# Patient Record
Sex: Male | Born: 1955 | Race: White | Hispanic: No | Marital: Married | Smoking: Former smoker
Health system: Southern US, Community
[De-identification: ages and names within clinical notes are randomized; demographics above are authoritative.]

## PROBLEM LIST (undated history)

## (undated) DIAGNOSIS — K7460 Unspecified cirrhosis of liver: Secondary | ICD-10-CM

## (undated) DIAGNOSIS — I428 Other cardiomyopathies: Secondary | ICD-10-CM

## (undated) DIAGNOSIS — E669 Obesity, unspecified: Secondary | ICD-10-CM

## (undated) DIAGNOSIS — Z7289 Other problems related to lifestyle: Secondary | ICD-10-CM

## (undated) DIAGNOSIS — E119 Type 2 diabetes mellitus without complications: Secondary | ICD-10-CM

## (undated) DIAGNOSIS — K760 Fatty (change of) liver, not elsewhere classified: Secondary | ICD-10-CM

## (undated) DIAGNOSIS — N400 Enlarged prostate without lower urinary tract symptoms: Secondary | ICD-10-CM

## (undated) DIAGNOSIS — I10 Essential (primary) hypertension: Secondary | ICD-10-CM

## (undated) DIAGNOSIS — E039 Hypothyroidism, unspecified: Secondary | ICD-10-CM

## (undated) DIAGNOSIS — I513 Intracardiac thrombosis, not elsewhere classified: Secondary | ICD-10-CM

## (undated) DIAGNOSIS — Z95810 Presence of automatic (implantable) cardiac defibrillator: Secondary | ICD-10-CM

## (undated) DIAGNOSIS — I472 Ventricular tachycardia, unspecified: Secondary | ICD-10-CM

## (undated) DIAGNOSIS — I442 Atrioventricular block, complete: Secondary | ICD-10-CM

## (undated) DIAGNOSIS — I2510 Atherosclerotic heart disease of native coronary artery without angina pectoris: Secondary | ICD-10-CM

## (undated) DIAGNOSIS — N184 Chronic kidney disease, stage 4 (severe): Secondary | ICD-10-CM

## (undated) DIAGNOSIS — E785 Hyperlipidemia, unspecified: Secondary | ICD-10-CM

## (undated) DIAGNOSIS — F1090 Alcohol use, unspecified, uncomplicated: Secondary | ICD-10-CM

## (undated) DIAGNOSIS — K635 Polyp of colon: Secondary | ICD-10-CM

## (undated) DIAGNOSIS — I5022 Chronic systolic (congestive) heart failure: Secondary | ICD-10-CM

## (undated) HISTORY — DX: Intracardiac thrombosis, not elsewhere classified: I51.3

## (undated) HISTORY — DX: Obesity, unspecified: E66.9

## (undated) HISTORY — DX: Chronic kidney disease, stage 4 (severe): N18.4

## (undated) HISTORY — PX: BACK SURGERY: SHX140

## (undated) HISTORY — DX: Polyp of colon: K63.5

## (undated) HISTORY — DX: Type 2 diabetes mellitus without complications: E11.9

## (undated) HISTORY — DX: Other cardiomyopathies: I42.8

## (undated) HISTORY — PX: CARDIAC CATHETERIZATION: SHX172

## (undated) HISTORY — DX: Hyperlipidemia, unspecified: E78.5

## (undated) HISTORY — PX: PACEMAKER INSERTION: SHX728

## (undated) HISTORY — DX: Ventricular tachycardia: I47.2

## (undated) HISTORY — DX: Essential (primary) hypertension: I10

## (undated) HISTORY — DX: Ventricular tachycardia, unspecified: I47.20

## (undated) HISTORY — PX: FACIAL RECONSTRUCTION SURGERY: SHX631

---

## 2002-11-20 ENCOUNTER — Encounter: Admission: RE | Admit: 2002-11-20 | Discharge: 2002-11-22 | Payer: Self-pay | Admitting: *Deleted

## 2004-11-23 ENCOUNTER — Ambulatory Visit: Payer: Self-pay | Admitting: *Deleted

## 2004-12-02 ENCOUNTER — Ambulatory Visit: Payer: Self-pay

## 2004-12-11 ENCOUNTER — Ambulatory Visit: Payer: Self-pay | Admitting: Internal Medicine

## 2004-12-11 ENCOUNTER — Ambulatory Visit: Payer: Self-pay | Admitting: Cardiology

## 2004-12-11 ENCOUNTER — Inpatient Hospital Stay (HOSPITAL_BASED_OUTPATIENT_CLINIC_OR_DEPARTMENT_OTHER): Admission: RE | Admit: 2004-12-11 | Discharge: 2004-12-11 | Payer: Self-pay | Admitting: Internal Medicine

## 2004-12-24 ENCOUNTER — Ambulatory Visit: Payer: Self-pay | Admitting: Cardiology

## 2005-01-21 ENCOUNTER — Ambulatory Visit: Payer: Self-pay | Admitting: Cardiology

## 2005-04-28 ENCOUNTER — Encounter (INDEPENDENT_AMBULATORY_CARE_PROVIDER_SITE_OTHER): Payer: Self-pay | Admitting: *Deleted

## 2005-04-28 ENCOUNTER — Observation Stay (HOSPITAL_COMMUNITY): Admission: EM | Admit: 2005-04-28 | Discharge: 2005-04-28 | Payer: Self-pay | Admitting: Emergency Medicine

## 2005-06-23 ENCOUNTER — Ambulatory Visit: Payer: Self-pay | Admitting: Cardiology

## 2006-03-02 ENCOUNTER — Ambulatory Visit: Payer: Self-pay | Admitting: Cardiology

## 2006-03-17 ENCOUNTER — Ambulatory Visit: Payer: Self-pay

## 2006-03-17 ENCOUNTER — Encounter: Payer: Self-pay | Admitting: Cardiology

## 2006-06-23 ENCOUNTER — Emergency Department (HOSPITAL_COMMUNITY): Admission: EM | Admit: 2006-06-23 | Discharge: 2006-06-23 | Payer: Self-pay | Admitting: Emergency Medicine

## 2006-08-19 ENCOUNTER — Inpatient Hospital Stay (HOSPITAL_COMMUNITY): Admission: EM | Admit: 2006-08-19 | Discharge: 2006-08-20 | Payer: Self-pay | Admitting: Emergency Medicine

## 2006-08-19 ENCOUNTER — Ambulatory Visit: Payer: Self-pay | Admitting: Internal Medicine

## 2006-08-19 ENCOUNTER — Encounter: Payer: Self-pay | Admitting: Internal Medicine

## 2006-10-31 ENCOUNTER — Ambulatory Visit: Payer: Self-pay | Admitting: Internal Medicine

## 2007-07-05 ENCOUNTER — Ambulatory Visit: Payer: Self-pay | Admitting: Internal Medicine

## 2007-08-05 LAB — HM COLONOSCOPY

## 2007-10-12 ENCOUNTER — Ambulatory Visit: Payer: Self-pay | Admitting: Internal Medicine

## 2008-01-23 ENCOUNTER — Ambulatory Visit: Payer: Self-pay | Admitting: Internal Medicine

## 2008-04-24 ENCOUNTER — Ambulatory Visit: Payer: Self-pay | Admitting: Internal Medicine

## 2008-10-23 ENCOUNTER — Ambulatory Visit: Payer: Self-pay | Admitting: Internal Medicine

## 2009-01-10 ENCOUNTER — Encounter: Payer: Self-pay | Admitting: Internal Medicine

## 2009-01-16 DIAGNOSIS — I498 Other specified cardiac arrhythmias: Secondary | ICD-10-CM | POA: Insufficient documentation

## 2009-01-16 DIAGNOSIS — I10 Essential (primary) hypertension: Secondary | ICD-10-CM

## 2009-01-16 DIAGNOSIS — I421 Obstructive hypertrophic cardiomyopathy: Secondary | ICD-10-CM

## 2009-01-16 DIAGNOSIS — Z794 Long term (current) use of insulin: Secondary | ICD-10-CM

## 2009-01-16 DIAGNOSIS — E785 Hyperlipidemia, unspecified: Secondary | ICD-10-CM

## 2009-01-16 DIAGNOSIS — I219 Acute myocardial infarction, unspecified: Secondary | ICD-10-CM | POA: Insufficient documentation

## 2009-01-16 DIAGNOSIS — E119 Type 2 diabetes mellitus without complications: Secondary | ICD-10-CM

## 2009-01-16 DIAGNOSIS — I429 Cardiomyopathy, unspecified: Secondary | ICD-10-CM | POA: Insufficient documentation

## 2009-01-16 DIAGNOSIS — I442 Atrioventricular block, complete: Secondary | ICD-10-CM | POA: Insufficient documentation

## 2009-01-17 ENCOUNTER — Ambulatory Visit: Payer: Self-pay | Admitting: Internal Medicine

## 2009-01-17 ENCOUNTER — Encounter: Payer: Self-pay | Admitting: Internal Medicine

## 2009-01-27 ENCOUNTER — Ambulatory Visit: Payer: Self-pay | Admitting: Cardiology

## 2009-01-27 ENCOUNTER — Inpatient Hospital Stay (HOSPITAL_COMMUNITY): Admission: EM | Admit: 2009-01-27 | Discharge: 2009-01-30 | Payer: Self-pay | Admitting: Emergency Medicine

## 2009-01-27 ENCOUNTER — Encounter: Payer: Self-pay | Admitting: Cardiology

## 2009-01-28 ENCOUNTER — Encounter: Payer: Self-pay | Admitting: Cardiology

## 2009-02-05 ENCOUNTER — Ambulatory Visit: Payer: Self-pay | Admitting: Cardiology

## 2009-02-17 ENCOUNTER — Ambulatory Visit: Payer: Self-pay | Admitting: Internal Medicine

## 2009-02-18 ENCOUNTER — Encounter: Payer: Self-pay | Admitting: Internal Medicine

## 2009-02-18 LAB — CONVERTED CEMR LAB
Basophils Relative: 0.8 % (ref 0.0–3.0)
Eosinophils Absolute: 0.4 10*3/uL (ref 0.0–0.7)
Eosinophils Relative: 7.7 % — ABNORMAL HIGH (ref 0.0–5.0)
HCT: 42.7 % (ref 39.0–52.0)
Lymphs Abs: 1.4 10*3/uL (ref 0.7–4.0)
MCHC: 34.7 g/dL (ref 30.0–36.0)
MCV: 88.8 fL (ref 78.0–100.0)
Monocytes Absolute: 0.2 10*3/uL (ref 0.1–1.0)
Neutrophils Relative %: 63.8 % (ref 43.0–77.0)
Platelets: 193 10*3/uL (ref 150.0–400.0)
Prothrombin Time: 9.2 s — ABNORMAL LOW (ref 10.9–13.3)
WBC: 5.8 10*3/uL (ref 4.5–10.5)

## 2009-02-19 ENCOUNTER — Inpatient Hospital Stay (HOSPITAL_COMMUNITY): Admission: EM | Admit: 2009-02-19 | Discharge: 2009-02-22 | Payer: Self-pay | Admitting: Emergency Medicine

## 2009-02-19 ENCOUNTER — Ambulatory Visit: Payer: Self-pay | Admitting: Cardiology

## 2009-02-19 ENCOUNTER — Telehealth: Payer: Self-pay | Admitting: Internal Medicine

## 2009-02-22 ENCOUNTER — Encounter: Payer: Self-pay | Admitting: Internal Medicine

## 2009-02-24 ENCOUNTER — Encounter: Payer: Self-pay | Admitting: Internal Medicine

## 2009-02-25 ENCOUNTER — Telehealth: Payer: Self-pay | Admitting: Internal Medicine

## 2009-02-26 ENCOUNTER — Telehealth: Payer: Self-pay | Admitting: Internal Medicine

## 2009-02-26 ENCOUNTER — Encounter: Payer: Self-pay | Admitting: Internal Medicine

## 2009-03-10 ENCOUNTER — Ambulatory Visit: Payer: Self-pay

## 2009-03-10 ENCOUNTER — Encounter: Payer: Self-pay | Admitting: Internal Medicine

## 2009-04-21 ENCOUNTER — Encounter: Payer: Self-pay | Admitting: Internal Medicine

## 2009-04-22 ENCOUNTER — Encounter: Payer: Self-pay | Admitting: Internal Medicine

## 2009-05-21 ENCOUNTER — Ambulatory Visit: Payer: Self-pay | Admitting: Cardiology

## 2009-06-10 ENCOUNTER — Ambulatory Visit: Payer: Self-pay | Admitting: Internal Medicine

## 2009-06-10 DIAGNOSIS — Z95810 Presence of automatic (implantable) cardiac defibrillator: Secondary | ICD-10-CM

## 2009-08-21 ENCOUNTER — Encounter (INDEPENDENT_AMBULATORY_CARE_PROVIDER_SITE_OTHER): Payer: Self-pay | Admitting: *Deleted

## 2009-09-18 ENCOUNTER — Encounter: Payer: Self-pay | Admitting: Internal Medicine

## 2009-09-28 ENCOUNTER — Encounter: Payer: Self-pay | Admitting: Internal Medicine

## 2009-09-29 ENCOUNTER — Ambulatory Visit: Payer: Self-pay | Admitting: Internal Medicine

## 2009-09-29 ENCOUNTER — Telehealth: Payer: Self-pay | Admitting: Internal Medicine

## 2010-01-07 ENCOUNTER — Ambulatory Visit: Payer: Self-pay | Admitting: Cardiology

## 2010-01-07 DIAGNOSIS — N259 Disorder resulting from impaired renal tubular function, unspecified: Secondary | ICD-10-CM

## 2010-01-07 DIAGNOSIS — E669 Obesity, unspecified: Secondary | ICD-10-CM

## 2010-01-15 ENCOUNTER — Ambulatory Visit: Payer: Self-pay | Admitting: Internal Medicine

## 2010-02-18 ENCOUNTER — Encounter: Payer: Self-pay | Admitting: Cardiology

## 2010-03-04 ENCOUNTER — Ambulatory Visit: Payer: Self-pay | Admitting: Cardiology

## 2010-04-14 ENCOUNTER — Ambulatory Visit: Payer: Self-pay | Admitting: Internal Medicine

## 2010-04-15 ENCOUNTER — Ambulatory Visit: Payer: Self-pay | Admitting: Cardiology

## 2010-04-23 ENCOUNTER — Encounter: Payer: Self-pay | Admitting: Internal Medicine

## 2010-05-20 ENCOUNTER — Ambulatory Visit: Payer: Self-pay | Admitting: Cardiology

## 2010-06-02 ENCOUNTER — Encounter: Payer: Self-pay | Admitting: Cardiology

## 2010-07-04 LAB — HM PAP SMEAR

## 2010-07-20 ENCOUNTER — Encounter: Payer: Self-pay | Admitting: Cardiology

## 2010-07-22 ENCOUNTER — Encounter: Payer: Self-pay | Admitting: Cardiology

## 2010-07-23 ENCOUNTER — Ambulatory Visit: Payer: Self-pay | Admitting: Internal Medicine

## 2010-08-07 ENCOUNTER — Encounter (INDEPENDENT_AMBULATORY_CARE_PROVIDER_SITE_OTHER): Payer: Self-pay | Admitting: *Deleted

## 2010-08-12 ENCOUNTER — Ambulatory Visit: Payer: Self-pay | Admitting: Cardiology

## 2010-10-22 ENCOUNTER — Ambulatory Visit
Admission: RE | Admit: 2010-10-22 | Discharge: 2010-10-22 | Payer: Self-pay | Source: Home / Self Care | Attending: Internal Medicine | Admitting: Internal Medicine

## 2010-10-22 ENCOUNTER — Encounter: Payer: Self-pay | Admitting: Internal Medicine

## 2010-11-03 NOTE — Letter (Signed)
Summary: Remote Device Check  [employer], Main Office  [address]   [place], [place] [address]   Phone: [phone]  Fax: [phone]     April 23, 2010 MRN: [id number]   ORY ELTING [address] [place], [place]  [address]   Dear Mr. LEISINGER,   Your remote transmission was recieved and reviewed by your physician.  All diagnostics were within normal limits for you.  __X___Your next transmission is scheduled for:  07-23-2010.  Please transmit at any time this day.  If you have a wireless device your transmission will be sent automatically.   Sincerely,  Vella Kohler

## 2010-11-03 NOTE — Assessment & Plan Note (Signed)
Summary: [name]   Visit Type:  Follow-up Primary Provider:  Dr.  Christell Constant  CC:  Cardiomyopathy.  History of Present Illness: The patient presents for followup of his known cardiomyopathy. At the last visit I increased his carvedilol to 18.75 mg b.i.d. He did well with this. He had no presyncope or syncope. He has had no shortness of breath. He denies any chest discomfort, neck or arm discomfort. He has had no weight gain or edema. He still admits to eating too much salt.  Current Medications (verified): 1)  Actos 30 Mg Tabs (Pioglitazone Hcl) .[name].. 1 By Mouth Once Daily 2)  Januvia 100 Mg Tabs (Sitagliptin Phosphate) .[name].. 1 By Mouth At Bedtime 3)  Trilipix 135 Mg Cpdr (Choline Fenofibrate) .[name].. 1 By Mouth Once Daily 4)  Vytorin 10-40 Mg Tabs (Ezetimibe-Simvastatin) .... Take One Tablet By Mouth Dailyat Bedtime 5)  Glimepiride 4 Mg Tabs (Glimepiride) .[name].. 1 By Mouth Two Times A Day 6)  Coreg 12.5 Mg Tabs (Carvedilol) .[name].. 1 1/2 By Mouth Daily 7)  Metformin Hcl 500 Mg Tabs (Metformin Hcl) .... 2 Tabs Two Times A Day 8)  Aspirin Ec 325 Mg Tbec (Aspirin) .... Take One Tablet By Mouth Daily 9)  Welchol 625 Mg Tabs (Colesevelam Hcl) .... Take 3 Tablets By Mouth Once Daily 10)  Fish Oil   Oil (Fish Oil) .... 3 Capsule Once Daily 11)  Vitamin D3 1000 Unit Caps (Cholecalciferol) .[name].. 1 By Mouth Once Daily 12)  Lantus 100 Unit/ml Soln (Insulin Glargine) .... As Directed 13)  Furosemide 40 Mg Tabs (Furosemide) .[name].. 1 By Mouth Daily  Allergies (verified): 1)  ! Nitroglycerin 2)  ! * No Ivp Dye Allergy 3)  ! * No Shellfish Allergy 4)  ! * No Latex Allergy  Past History:  Past Medical History: Reviewed history from 01/16/2009 and no changes required. 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 Pacemaker - medtronic  Review of Systems       As stated in the HPI and negative for all other systems.   Vital Signs:  Patient profile:   55 year old male Height:      73 inches Weight:      270 pounds BMI:     35.75 Resp:     16 per minute BP sitting:   124 / 72  (right arm)  Vitals Entered By: Marrion Coy, CNA (April 15, 2010 9:47 AM)  Physical Exam  General:  Well developed, well nourished, in no acute distress. Head:  normocephalic and atraumatic Mouth:  Edentulous.. Oral mucosa normal. Neck:  Neck supple, no JVD. No masses, thyromegaly or abnormal cervical nodes. Chest Wall:  well-healed ICD pocket Lungs:  Clear bilaterally to auscultation with no wheezes, rales, or rhonchi. Heart:  Distant with a RRR.  Normal S1 and S2.  PMI is enlarged and laterally displaced. Abdomen:  Bowel sounds positive; abdomen soft and non-tender without masses, organomegaly, or hernias noted. No hepatosplenomegaly, obese Msk:  Back normal, normal gait. Muscle strength and tone normal. Extremities:  no edema. Neurologic:  Alert and oriented x 3. Skin:  Intact without lesions or rashes. Psych:  Normal affect.   EKG  Procedure date:  04/15/2010  Findings:      AV paced rhythm Imitrex topic complexes  PPM Specifications Following MD:  Inactive     PPM Vendor:  Medtronic     PPM Model Number:  ADDR01     PPM Serial Number:  [id number] PPM DOI:  08/19/2006     PPM Implanting MD:  Lewayne Bunting, MD  Lead 1    Location: RA     DOI: 08/19/2006     Model #: 0347     Serial #: [id number]     Status: active Lead 2    Location: RV     DOI: 08/19/2006     Model #: 6433     Serial #: [id number]     Status: active  Magnet Response Rate:  BOL 85 ERI  65  Indications:  CHB Pacemaker dependent  Explantation Comments:  02/21/2009 Addapta  [id number] H EXPLANTED  PPM Follow Up Pacer Dependent:  Yes      Episodes Coumadin:  No  Parameters Mode:  DDDR     Lower Rate Limit:  60     Upper Rate Limit:  130 Paced AV Delay:  200     Sensed AV Delay:  170  ICD Specifications Following MD:  Lewayne Bunting, MD     Referring MD:  [facility] ICD Vendor:  Medtronic     ICD Model Number:  [id number]     ICD Serial Number:  [id number] H ICD DOI:  02/21/2009     ICD Implanting MD:  Lewayne Bunting, MD  Lead 1:    Location: RA     DOI: 08/19/2006     Model #: 3220     Serial #: [id number]     Status: active Lead 2:    Location: RV     DOI: 02/21/2009     Model #: 6283     Serial #: [id number]     Status: active Lead 3:    Location: LV     DOI: 02/21/2009     Model #: 7371     Serial #: [id number]     Status: active  Indications::  CM   ICD Follow Up ICD Dependent:  Yes       ICD Device Measurements Configuration: LV TIP TO RV COIL  Episodes Coumadin:  No  Brady Parameters Mode DDDR     Lower Rate Limit:  60     Upper Rate Limit 130 PAV 130     Sensed AV Delay:  100  Tachy Zones VF:  240     VT:  200 FVT VIA VT     VT1:  171     Impression & Recommendations:  Problem # 1:  CARDIOMYOPATHY, SECONDARY (ICD-425.9) The patient tolerated the increased dose of carvedilol in today I will increase to 25 mg daily. I am avoiding ACE and ARB for now because of renal insufficiency but I will consider this at future appointments. Orders: EKG w/ Interpretation (93000)  Problem # 2:  MYOCARDIAL INFARCTION (ICD-410.90) He is having no symptoms related to coronary artery disease. He will continue with risk reduction.  Patient Instructions: 1)  Your physician recommends that you schedule a follow-up appointment in: 1 month with Dr Antoine Poche in [place] 2)  Your physician has recommended you make the following change in your medication: Increase Carvedilol to 25 mg twice a day 3)  You have been diagnosed with Congestive Heart Failure or CHF.  CHF is  a condition in which a problem with the structure or function of the heart impairs its ability to supply sufficient blood flow to meet the body's needs.  For further information please visit www.cardiosmart.org for detailed information on CHF. 4)  Your physician recommends that you weigh, daily, at the same time every day, and in the same amount of clothing.  Please record your daily weights on the handout provided and bring it to your next appointment.

## 2010-11-03 NOTE — Cardiovascular Report (Signed)
Summary: Office Visit Remote   Office Visit Remote   Imported By: Roderic Ovens 04/23/2010 16:13:29  _____________________________________________________________________  External Attachment:    Type:   Image     Comment:   External Document

## 2010-11-03 NOTE — Assessment & Plan Note (Signed)
Summary: 1 yr f/u medtronic   Visit Type:  Follow-up Primary Provider:  Dr.  Christell Constant   History of Present Illness: [name] returns today for ICD followup.  He has a h/o Class III heart failure with complete heart block and acute and longstanding cardiomyopathy (hypertrophic obstructive cardiomyopathy).  He denies c/p, sob and peripheral edema.  No intercurrent ICD therapies.  He continues to work.  Current Medications (verified): 1)  Actos 30 Mg Tabs (Pioglitazone Hcl) .[name].. 1 By Mouth Once Daily 2)  Januvia 100 Mg Tabs (Sitagliptin Phosphate) .[name].. 1 By Mouth At Bedtime 3)  Trilipix 135 Mg Cpdr (Choline Fenofibrate) .[name].. 1 By Mouth Once Daily 4)  Vytorin 10-40 Mg Tabs (Ezetimibe-Simvastatin) .... Take One Tablet By Mouth Dailyat Bedtime 5)  Glimepiride 4 Mg Tabs (Glimepiride) .[name].. 1 By Mouth Two Times A Day 6)  Carvedilol 6.25 Mg Tabs (Carvedilol) .... Take 2 Tablets By Mouth Twice A Day 7)  Metformin Hcl 500 Mg Tabs (Metformin Hcl) .... 2 Tabs Two Times A Day 8)  Aspirin Ec 325 Mg Tbec (Aspirin) .... Take One Tablet By Mouth Daily 9)  Welchol 625 Mg Tabs (Colesevelam Hcl) .... Take 3 Tablets By Mouth Once Daily 10)  Fish Oil   Oil (Fish Oil) .... 3 Capsule Once Daily 11)  Vitamin D3 1000 Unit Caps (Cholecalciferol) .[name].. 1 By Mouth Once Daily 12)  Lantus 100 Unit/ml Soln (Insulin Glargine) .... As Directed 13)  Furosemide 40 Mg Tabs (Furosemide) .[name].. 1 By Mouth Daily  Allergies: 1)  ! Nitroglycerin 2)  ! * No Ivp Dye Allergy 3)  ! * No Shellfish Allergy 4)  ! 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    Vital Signs:  Patient profile:   55 year old male Height:      73 inches Weight:      276 pounds BMI:     36.55 Pulse rate:   61 / minute BP sitting:   120 / 80  (left arm)  Vitals Entered By: Laurance Flatten CMA (January 15, 2010 9:22 AM)  Physical Exam  General:  Well developed, well nourished, in no acute distress. Head:  normocephalic and atraumatic Eyes:  PERRLA/EOM intact; conjunctiva and lids normal. Mouth:  Edentulous.. Oral mucosa normal. Neck:  Neck supple, no JVD. No masses, thyromegaly or abnormal cervical nodes. Chest Wall:  well-healed ICD pocket Lungs:  Clear bilaterally to auscultation with no wheezes, rales, or rhonchi. Heart:  Distant with a RRR.  Normal S1 and S2.  PMI is enlarged and laterally displaced. Abdomen:  Bowel sounds positive; abdomen soft and non-tender without masses, organomegaly, or hernias noted. No hepatosplenomegaly, obese Msk:  Back normal, normal gait. Muscle strength and tone normal. Pulses:  normal pedal pulses bilaterally.   Extremities:  no edema. Neurologic:  Alert and oriented x 3.   PPM Specifications Following MD:  Inactive     PPM Vendor:  Medtronic     PPM Model Number:  ADDR01     PPM Serial Number:  [id number] PPM DOI:  08/19/2006     PPM Implanting MD:  Lewayne Bunting, MD  Lead 1  Location: RA     DOI: 08/19/2006     Model #: 7829     Serial #: [id number]     Status: active Lead 2    Location: RV     DOI: 08/19/2006     Model #: 8469     Serial #: [id number]     Status: active  Magnet Response Rate:  BOL 85 ERI  65  Indications:  CHB Pacemaker dependent  Explantation Comments:  02/21/2009 Addapta [id number] H EXPLANTED  PPM Follow Up Pacer Dependent:  Yes      Episodes Coumadin:  No  Parameters Mode:  DDDR     Lower Rate Limit:  60     Upper Rate Limit:  130 Paced AV Delay:   200     Sensed AV Delay:  170  ICD Specifications Following MD:  Lewayne Bunting, MD     Referring MD:  [facility] ICD Vendor:  Medtronic     ICD Model Number:  [id number]     ICD Serial Number:  [id number] ICD DOI:  02/21/2009     ICD Implanting MD:  Lewayne Bunting, MD  Lead 1:    Location: RA     DOI: 08/19/2006     Model #: 4742     Serial #: [id number]     Status: active Lead 2:    Location: RV     DOI: 02/21/2009     Model #: 3329     Serial #: [id number]     Status: active Lead 3:    Location: LV     DOI: 02/21/2009     Model #: 0630     Serial #: [id number]     Status: active  Indications::  CM   ICD Follow Up Remote Check?  No Battery Voltage:  3.16 V     Charge Time:  9.3 seconds     Underlying rhythm:  CHB ICD Dependent:  Yes       ICD Device Measurements Atrium:  Amplitude: 2.0 mV, Impedance: 532 ohms, Threshold: 0.5 V at 0.4 msec Right Ventricle:  Amplitude: PACED AT 30 mV, Impedance: 437 ohms, Threshold: 1.0 V at 0.4 msec Left Ventricle:  Impedance: 494 ohms, Threshold: 0.75 V at 0.4 msec Configuration: LV TIP TO RV COIL Shock Impedance: 49/65 ohms   Episodes MS Episodes:  0     Coumadin:  No Shock:  0     ATP:  0     Nonsustained:  0     Atrial Pacing:  83.7%     Ventricular Pacing:  99.5%  Brady Parameters Mode DDDR     Lower Rate Limit:  60     Upper Rate Limit 130 PAV 130     Sensed AV Delay:  100  Tachy Zones VF:  240     VT:  200 FVT VIA VT     VT1:  171     Next Remote Date:  04/14/2010     Next Cardiology Appt Due:  01/04/2011 Tech Comments:  Normal device function.  No changes made today.  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  ROV 12 months GT. Gypsy Balsam RN BSN  January 15, 2010 9:38 AM  MD Comments:  Agree with above.  Impression & Recommendations:  Problem # 1:  AUTOMATIC IMPLANTABLE CARDIAC DEFIBRILLATOR SITU (ICD-V45.02) His device is working normally.  Will recheck in several months.  Problem # 2:  CARDIOMYOPATHY, SECONDARY (ICD-425.9) He denies CHF symptoms.   He is class 1-2 at  present. A low sodium diet is requested. His updated medication list for this problem includes:    Carvedilol 6.25 Mg Tabs (Carvedilol) .[name]... Take 2 tablets by mouth twice a day    Aspirin Ec 325 Mg Tbec (Aspirin) .[name]... Take one tablet by mouth daily    Furosemide 40 Mg Tabs (Furosemide) .[name]... 1 by mouth daily  Problem # 3:  HYPERTENSION, UNSPECIFIED (ICD-401.9) Continue current meds.  A low sodium diet is recommended. His updated medication list for this problem includes:    Carvedilol 6.25 Mg Tabs (Carvedilol) .[name]... Take 2 tablets by mouth twice a day    Aspirin Ec 325 Mg Tbec (Aspirin) .[name]... Take one tablet by mouth daily    Furosemide 40 Mg Tabs (Furosemide) .[name]... 1 by mouth daily  Patient Instructions: 1)  Your physician wants you to follow-up in: 12 months with Dr [name].  You will receive a reminder letter in the mail two months in advance. If you don't receive a letter, please call our office to schedule the follow-up appointment.

## 2010-11-03 NOTE — Assessment & Plan Note (Signed)
Summary: [facility]   Visit Type:  Follow-up Primary Provider:  Dr.  Christell Constant  CC:  Cardiomyopathy.  History of Present Illness: The ppresents for followup although his cardiomyopathy. At the last appointment I increased his carvedilol to 25 mg b.i.d. He did well with this. He has had no lightheadedness, presyncope or syncope. He has had no chest pressure, neck or arm discomfort. He has had no new shortness of breath, PND or orthopnea. He has had no edema.  Unfortunately he is not dieting and has gained 5 pounds.   Current Medications (verified): 1)  Actos 30 Mg Tabs (Pioglitazone Hcl) .[name].. 1 By Mouth Once Daily 2)  Januvia 100 Mg Tabs (Sitagliptin Phosphate) .[name].. 1 By Mouth At Bedtime 3)  Trilipix 135 Mg Cpdr (Choline Fenofibrate) .[name].. 1 By Mouth Once Daily 4)  Vytorin 10-40 Mg Tabs (Ezetimibe-Simvastatin) .... Take One Tablet By Mouth Dailyat Bedtime 5)  Glimepiride 4 Mg Tabs (Glimepiride) .[name].. 1 By Mouth Two Times A Day 6)  Coreg 12.5 Mg Tabs (Carvedilol) .... 2 By Mouth Two Times A Day 7)  Metformin Hcl 500 Mg Tabs (Metformin Hcl) .... 2 Tabs Two Times A Day 8)  Aspirin Ec 325 Mg Tbec (Aspirin) .... Take One Tablet By Mouth Daily 9)  Welchol 625 Mg Tabs (Colesevelam Hcl) .... Take 3 Tablets By Mouth Once Daily 10)  Fish Oil   Oil (Fish Oil) .... 3 Capsule Once Daily 11)  Vitamin D3 1000 Unit Caps (Cholecalciferol) .[name].. 1 By Mouth Once Daily 12)  Lantus 100 Unit/ml Soln (Insulin Glargine) .... As Directed 13)  Furosemide 40 Mg Tabs (Furosemide) .[name].. 1 By Mouth Daily 14)  Lisinopril 2.5 Mg Tabs (Lisinopril) .... One Daily  Allergies (verified): 1)  ! Nitroglycerin 2)  ! * No Ivp Dye Allergy 3)  ! * No Shellfish Allergy 4)  ! * No Latex Allergy  Past History:  Past Medical History: Reviewed history from 01/16/2009 and no changes required. 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 Pacemaker - medtronic  Review of Systems       As stated in the HPI and negative for all other systems.   Vital Signs:  Patient profile:   55 year old male Height:      73 inches Weight:      275 pounds BMI:     36.41 Pulse rate:   84 / minute Resp:     16 per minute BP sitting:   120 / 78  (right arm)  Vitals Entered By: Marrion Coy, CNA (May 20, 2010 9:54 AM)  Physical Exam  General:  Well developed, well nourished, in no acute distress. Head:  normocephalic and atraumatic Neck:  Neck supple, no JVD. No masses, thyromegaly or abnormal cervical nodes. Chest Wall:  well-healed ICD pocket Lungs:  Clear bilaterally to auscultation with no wheezes, rales, or rhonchi. Abdomen:  Bowel sounds positive; abdomen soft and non-tender without masses, organomegaly, or hernias noted. No hepatosplenomegaly, obese Msk:  Back normal, normal gait. Muscle strength and tone normal. Extremities:  no edema. Neurologic:  Alert and oriented x 3. Skin:  Intact without lesions or rashes.   Detailed Cardiovascular Exam  Neck    Carotids: Carotids full and equal bilaterally without bruits.      Neck Veins: Normal, no JVD.  Heart    Inspection: no deformities or lifts noted.      Palpation: normal PMI with no thrills palpable.      Auscultation: regular rate and rhythm, S1, S2 without murmurs, rubs, gallops, or clicks.    Vascular    Abdominal Aorta: no palpable masses, pulsations, or audible bruits.      Femoral Pulses: normal femoral pulses bilaterally.      Pedal Pulses: normal pedal pulses bilaterally.      Radial Pulses: normal radial pulses bilaterally.      Peripheral Circulation: no clubbing, cyanosis, or edema noted with  normal capillary refill.     PPM Specifications Following MD:  Inactive     PPM Vendor:  Medtronic     PPM Model Number:  ADDR01     PPM Serial Number:  [id number] PPM DOI:  08/19/2006     PPM Implanting MD:  Lewayne Bunting, MD  Lead 1    Location: RA     DOI: 08/19/2006     Model #: 4098     Serial #: [id number]     Status: active Lead 2    Location: RV     DOI: 08/19/2006     Model #: 6213     Serial #: [id number]     Status: active  Magnet Response Rate:  BOL 85 ERI  65  Indications:  CHB Pacemaker dependent  Explantation Comments:  02/21/2009 Addapta [id number] H EXPLANTED  PPM Follow Up Pacer Dependent:  Yes      Episodes Coumadin:  No  Parameters Mode:  DDDR     Lower Rate Limit:  60     Upper Rate Limit:  130 Paced AV Delay:  200     Sensed AV Delay:  170  ICD Specifications Following MD:  Lewayne Bunting, MD     Referring MD:  [facility] ICD Vendor:  Medtronic     ICD Model Number:  [id number]     ICD Serial Number:  [id number] ICD DOI:  02/21/2009     ICD Implanting MD:  Lewayne Bunting, MD  Lead 1:    Location: RA     DOI: 08/19/2006     Model #: 2536     Serial #: [id number]     Status: active Lead 2:    Location: RV     DOI: 02/21/2009     Model #: 9563     Serial #: [id number]     Status: active Lead 3:    Location: LV     DOI: 02/21/2009     Model #: 9518     Serial #: [id number]     Status: active  Indications::  CM   ICD Follow Up ICD Dependent:  Yes       ICD Device Measurements Configuration: LV TIP TO RV COIL  Episodes Coumadin:  No  Brady Parameters Mode DDDR     Lower Rate Limit:  60     Upper Rate Limit 130 PAV 130     Sensed AV Delay:  100  Tachy Zones VF:  240     VT:  200 FVT VIA VT     VT1:  171     Impression & Recommendations:  Problem # 1:  CARDIOMYOPATHY, SECONDARY (ICD-425.9) Today I am going to start ACE inhibitor. We will be very aware of his renal insufficiency. We'll be aware that he is on metformin if he has any reduction in his  creatinine clearance this may need to  be stopped. I will check a basic metabolic profile in 10 days. I have discussed the risks benefits of this medication with the patient.  Problem # 2:  RENAL INSUFFICIENCY (ICD-588.9) As above.  Problem # 3:  AUTOMATIC IMPLANTABLE CARDIAC DEFIBRILLATOR SITU (ICD-V45.02) He has is followed in our device clinic.  Patient Instructions: 1)  Your physician recommends that you schedule a follow-up appointment in: 1 month  2)  Your physician recommends that you return for lab work in:  10 days to 2 weeks  BMP 428.22 v58.69 3)  Your physician has recommended you make the following change in your medication: start Lisinopril 2.5 mg a day Prescriptions: LISINOPRIL 2.5 MG TABS (LISINOPRIL) one daily  #30 x 1   Entered by:   Charolotte Capuchin, RN   Authorized by:   Rollene Rotunda, MD, [facility]   Signed by:   Charolotte Capuchin, RN on 05/20/2010   Method used:   Electronically to        [facility] [phone]* (retail)       [address] [place], [place]  [address]       Ph: [phone] or [phone]       Fax: [phone]   RxID:   [id number] CARVEDILOL 25 MG TABS (CARVEDILOL) one two times a day  #180 x 3   Entered by:   Charolotte Capuchin, RN   Authorized by:   Rollene Rotunda, MD, [facility]   Signed by:   Charolotte Capuchin, RN on 05/20/2010   Method used:   Electronically to        [facility]* (retail)             ,          Ph: [phone]       Fax: [phone]   RxID:   [id number] LISINOPRIL 2.5 MG TABS (LISINOPRIL) one daily  #90 x 3   Entered by:   Charolotte Capuchin, RN   Authorized by:   Rollene Rotunda, MD, [facility]   Signed by:   Charolotte Capuchin, RN on 05/20/2010   Method used:   Electronically to        [facility]* (retail)             ,          Ph: [phone]       Fax: [phone]   RxID:   [id number]  I have reviewed and approved all prescriptions at the time of this  visit. Rollene Rotunda, MD, [facility]  May 20, 2010 10:40 AM

## 2010-11-03 NOTE — Assessment & Plan Note (Signed)
Summary: [facility]   Visit Type:  Follow-up Primary Provider:  Dr.  Christell Constant  CC:  CAD and cardiomyopathy.  History of Present Illness: Patient presents for followup of the above. At the last appointment I increased his carvedilol to 12.5 mg b.i.d. He did well with this. He has had no lightheadedness, presyncope or syncope. He has had no shortness of breath, PND or orthopnea. He hasn't chest is, neck or arm discomfort. I did review labs done on the 19th of this month demonstrating a BUN of 20 and a creatinine of 1.59. Hemoglobin A1c was 9.2. LDL and HDL were both 34.  Current Medications (verified): 1)  Actos 30 Mg Tabs (Pioglitazone Hcl) .[name].. 1 By Mouth Once Daily 2)  Januvia 100 Mg Tabs (Sitagliptin Phosphate) .[name].. 1 By Mouth At Bedtime 3)  Trilipix 135 Mg Cpdr (Choline Fenofibrate) .[name].. 1 By Mouth Once Daily 4)  Vytorin 10-40 Mg Tabs (Ezetimibe-Simvastatin) .... Take One Tablet By Mouth Dailyat Bedtime 5)  Glimepiride 4 Mg Tabs (Glimepiride) .[name].. 1 By Mouth Two Times A Day 6)  Carvedilol 6.25 Mg Tabs (Carvedilol) .... Take 2 Tablets By Mouth Twice A Day 7)  Metformin Hcl 500 Mg Tabs (Metformin Hcl) .... 2 Tabs Two Times A Day 8)  Aspirin Ec 325 Mg Tbec (Aspirin) .... Take One Tablet By Mouth Daily 9)  Welchol 625 Mg Tabs (Colesevelam Hcl) .... Take 3 Tablets By Mouth Once Daily 10)  Fish Oil   Oil (Fish Oil) .... 3 Capsule Once Daily 11)  Vitamin D3 1000 Unit Caps (Cholecalciferol) .[name].. 1 By Mouth Once Daily 12)  Lantus 100 Unit/ml Soln (Insulin Glargine) .... As Directed 13)  Furosemide 40 Mg Tabs (Furosemide) .[name].. 1 By Mouth Daily  Allergies (verified): 1)  ! Nitroglycerin 2)  ! * No Ivp Dye Allergy 3)  ! * No Shellfish Allergy 4)  ! * No Latex Allergy  Past History:  Past Medical History: Reviewed history from 01/16/2009 and no changes required. 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 Pacemaker - medtronic  Review of Systems       As stated in the HPI and negative for all other systems.   Vital Signs:  Patient profile:   55 year old male Height:      73 inches Weight:      275 pounds BMI:     36.41 Pulse rate:   65 / minute Resp:     16 per minute BP sitting:   124 / 76  (right arm)  Vitals Entered By: Marrion Coy, CNA (March 04, 2010 10:04 AM)  Physical Exam  General:  Well developed, well nourished, in no acute distress. Head:  normocephalic and atraumatic Neck:  Neck supple, no JVD. No masses, thyromegaly or abnormal cervical nodes. Chest Wall:  well-healed ICD pocket Lungs:  Clear bilaterally to auscultation with no wheezes, rales, or rhonchi. Heart:  Distant with a RRR.  Normal S1 and S2.  PMI is enlarged and laterally displaced. Abdomen:  Bowel sounds positive; abdomen soft and non-tender without masses, organomegaly, or hernias noted. No hepatosplenomegaly, obese Msk:  Back normal, normal gait. Muscle strength and tone normal. Extremities:  no edema. Neurologic:  Alert and oriented x 3. Psych:  Normal affect.   PPM Specifications Following MD:  Inactive  PPM Vendor:  Medtronic     PPM Model Number:  ADDR01     PPM Serial Number:  [id number] PPM DOI:  08/19/2006     PPM Implanting MD:  Lewayne Bunting, MD  Lead 1    Location: RA     DOI: 08/19/2006     Model #: 4098     Serial #: [id number]     Status: active Lead 2    Location: RV     DOI: 08/19/2006     Model #: 6213     Serial #: [id number]     Status: active  Magnet Response Rate:  BOL 85 ERI  65  Indications:  CHB Pacemaker dependent  Explantation Comments:  02/21/2009 Addapta [id number] H  EXPLANTED  PPM Follow Up Pacer Dependent:  Yes      Episodes Coumadin:  No  Parameters Mode:  DDDR     Lower Rate Limit:  60     Upper Rate Limit:  130 Paced AV Delay:  200     Sensed AV Delay:  170  ICD Specifications Following MD:  Lewayne Bunting, MD     Referring MD:  [facility] ICD Vendor:  Medtronic     ICD Model Number:  [id number]     ICD Serial Number:  [id number] ICD DOI:  02/21/2009     ICD Implanting MD:  Lewayne Bunting, MD  Lead 1:    Location: RA     DOI: 08/19/2006     Model #: 2536     Serial #: [id number]     Status: active Lead 2:    Location: RV     DOI: 02/21/2009     Model #: 9563     Serial #: [id number]     Status: active Lead 3:    Location: LV     DOI: 02/21/2009     Model #: 9518     Serial #: [id number]     Status: active  Indications::  CM   ICD Follow Up ICD Dependent:  Yes       ICD Device Measurements Configuration: LV TIP TO RV COIL  Episodes Coumadin:  No  Brady Parameters Mode DDDR     Lower Rate Limit:  60     Upper Rate Limit 130 PAV 130     Sensed AV Delay:  100  Tachy Zones VF:  240     VT:  200 FVT VIA VT     VT1:  171     Impression & Recommendations:  Problem # 1:  CARDIOMYOPATHY, SECONDARY (ICD-425.9) Today I will titrate his carvedilol 18.75 b.i.d. I will consider an ACE or ARB after getting to a target carvedilol dose.  Problem # 2:  AUTOMATIC IMPLANTABLE CARDIAC DEFIBRILLATOR SITU (ICD-V45.02) He is up-to-date with followup of this.  Problem # 3:  OBESITY, UNSPECIFIED (ICD-278.00) He understands the need to lose weight with diet and exercise.  Patient Instructions: 1)  Your physician recommends that you schedule a follow-up appointment on July 13,2011 2)  Your physician has recommended you make the following change in your medication: Increase carvedilol to 18.75 mg twice a day.

## 2010-11-03 NOTE — Cardiovascular Report (Signed)
Summary: Office Visit Remote   Office Visit Remote   Imported By: Roderic Ovens 08/11/2010 15:19:53  _____________________________________________________________________  External Attachment:    Type:   Image     Comment:   External Document

## 2010-11-03 NOTE — Assessment & Plan Note (Signed)
Summary: [facility]   Visit Type:  Follow-up Primary Provider:  Dr.  Christell Constant  CC:  CHF.  History of Present Illness: The patient presents for followup of his cardiomyopathy. At the last appointment I started a low dose of ACE inhibitor. He tolerated this. We have checked his creatinine a couple of times and it has remained stable. He has had no presyncope or syncope. He has had no chest pressure, neck or arm discomfort. He has had no shortness of breath, PND or orthopnea.  Current Medications (verified): 1)  Actos 30 Mg Tabs (Pioglitazone Hcl) .[name].. 1 By Mouth Once Daily 2)  Januvia 100 Mg Tabs (Sitagliptin Phosphate) .[name].. 1 By Mouth At Bedtime 3)  Trilipix 135 Mg Cpdr (Choline Fenofibrate) .[name].. 1 By Mouth Once Daily 4)  Vytorin 10-40 Mg Tabs (Ezetimibe-Simvastatin) .... Take One Tablet By Mouth Dailyat Bedtime 5)  Glimepiride 4 Mg Tabs (Glimepiride) .[name].. 1 By Mouth Two Times A Day 6)  Carvedilol 25 Mg Tabs (Carvedilol) .... One Two Times A Day 7)  Metformin Hcl 500 Mg Tabs (Metformin Hcl) .... 2 Tabs Two Times A Day 8)  Aspirin Ec 325 Mg Tbec (Aspirin) .... Take One Tablet By Mouth Daily 9)  Welchol 625 Mg Tabs (Colesevelam Hcl) .... Take 3 Tablets By Mouth Once Daily 10)  Fish Oil   Oil (Fish Oil) .... 3 Capsule Once Daily 11)  Vitamin D3 1000 Unit Caps (Cholecalciferol) .[name].. 1 By Mouth Once Daily 12)  Lantus 100 Unit/ml Soln (Insulin Glargine) .... As Directed 13)  Furosemide 40 Mg Tabs (Furosemide) .[name].. 1 By Mouth Daily 14)  Lisinopril 2.5 Mg Tabs (Lisinopril) .... One Daily  Allergies (verified): 1)  ! Nitroglycerin 2)  ! * No Ivp Dye Allergy 3)  ! * No Shellfish Allergy 4)  ! * No Latex Allergy  Past History:  Past Medical History: Reviewed history from 01/16/2009 and no changes required. PACEMAKER (ICD-V45.[name]01) MYOCARDIAL INFARCTION (ICD-410.90) HYPERTROPHIC OBSTRUCTIVE CARDIOMYOPATHY (ICD-425.1) CARDIOMYOPATHY, SECONDARY (ICD-425.9) DM (ICD-250.00) HYPERTENSION,  UNSPECIFIED (ICD-401.9) HYPERLIPIDEMIA-MIXED (ICD-272.4) BRADYCARDIA (ICD-427.89) AV BLOCK, COMPLETE (ICD-426.0)    Past Surgical History: Reviewed history from 01/07/2010 and no changes required. Pacemaker - medtronic  Review of Systems       As stated in the HPI and negative for all other systems.   Vital Signs:  Patient profile:   55 year old male Height:      73 inches Weight:      278 pounds BMI:     36.81 Pulse rate:   67 / minute Resp:     18 per minute BP sitting:   102 / 66  (right arm)  Vitals Entered By: Marrion Coy, CNA (August 12, 2010 11:27 AM)  Physical Exam  General:  Well developed, well nourished, in no acute distress. Head:  normocephalic and atraumatic Neck:  Neck supple, no JVD. No masses, thyromegaly or abnormal cervical nodes. Chest Wall:  well-healed ICD pocket Lungs:  Clear bilaterally to auscultation with no wheezes, rales, or rhonchi. Heart:  Distant with a RRR.  Normal S1 and S2.  PMI is enlarged and laterally displaced. Abdomen:  Bowel sounds positive; abdomen soft and non-tender without masses, organomegaly, or hernias noted. No hepatosplenomegaly, obese Msk:  Back normal, normal gait. Muscle strength and tone normal. Pulses:  normal pedal pulses bilaterally.   Extremities:  no edema. Neurologic:  Alert and oriented x 3. Skin:  Intact without lesions or rashes. Cervical Nodes:  no significant adenopathy Psych:  Normal affect.  EKG  Procedure date:  08/12/2010  Findings:      Sinus rhythm with ventricular pacing  PPM Specifications Following MD:  Inactive     PPM Vendor:  Medtronic     PPM Model Number:  ADDR01     PPM Serial Number:  [id number] PPM DOI:  08/19/2006     PPM Implanting MD:  Lewayne Bunting, MD  Lead 1    Location: RA     DOI: 08/19/2006     Model #: 7846     Serial #: [id number]     Status: active Lead 2    Location: RV     DOI: 08/19/2006     Model #: 2440     Serial #: [id number]     Status: active  Magnet  Response Rate:  BOL 85 ERI  65  Indications:  CHB Pacemaker dependent  Explantation Comments:  02/21/2009 Addapta [id number] H EXPLANTED  PPM Follow Up Pacer Dependent:  Yes      Episodes Coumadin:  No  Parameters Mode:  DDDR     Lower Rate Limit:  60     Upper Rate Limit:  130 Paced AV Delay:  200     Sensed AV Delay:  170  ICD Specifications Following MD:  Lewayne Bunting, MD     Referring MD:  [facility] ICD Vendor:  Medtronic     ICD Model Number:  [id number]     ICD Serial Number:  [id number] ICD DOI:  02/21/2009     ICD Implanting MD:  Lewayne Bunting, MD  Lead 1:    Location: RA     DOI: 08/19/2006     Model #: 4332     Serial #: [id number]     Status: active Lead 2:    Location: RV     DOI: 02/21/2009     Model #: 6301     Serial #: [id number]     Status: active Lead 3:    Location: LV     DOI: 02/21/2009     Model #: 4194     Serial #: [id number]     Status: active  Indications::  CM   ICD Follow Up ICD Dependent:  Yes       ICD Device Measurements Configuration: LV TIP TO RV COIL  Episodes Coumadin:  No  Brady Parameters Mode DDDR     Lower Rate Limit:  60     Upper Rate Limit 130 PAV 130     Sensed AV Delay:  100  Tachy Zones VF:  240     VT:  200 FVT VIA VT     VT1:  171     Impression & Recommendations:  Problem # 1:  CARDIOMYOPATHY, SECONDARY (ICD-425.9) Today I will titrate his meds further as his blood pressure is somewhat low. He does well on salt restriction and he will continue on meds as listed.  Problem # 2:  OBESITY, UNSPECIFIED (ICD-278.00) We continue to discuss the need for weight loss with diet and exercise.  Problem # 3:  BRADYCARDIA (ICD-427.89) He is up-to-date with pacemaker followup. Orders: EKG w/ Interpretation (93000)  Patient Instructions: 1)  Your physician recommends that you schedule a follow-up appointment in: 6 months with Dr Antoine Poche in [place] 2)  Your physician recommends that you continue on your current medications as  directed. Please refer to the Current Medication list given to you today.

## 2010-11-03 NOTE — Letter (Signed)
Summary: Remote Device Check  [facility], Main Office  [address]   [place], [place] [address]   Phone: [phone]  Fax: [phone]     August 07, 2010 MRN: [id number]   [name] [address] [place], [place]  [address]   Dear [name],   Your remote transmission was recieved and reviewed by your physician.  All diagnostics were within normal limits for you.  ___X__Your next transmission is scheduled for:  10/22/2010.  Please transmit at any time this day.  If you have a wireless device your transmission will be sent automatically.  ______Your next office visit is scheduled for:                              . Please call our office to schedule an appointment.    Sincerely,  Altha Harm, LPN

## 2010-11-03 NOTE — Assessment & Plan Note (Signed)
Summary: [facility]   Visit Type:  Follow-up Primary Provider:  Dr.  Christell Constant  CC:  Cardiomyopathy and HTN.  History of Present Illness: The patient presents for further evaluation of his cardiomyopathy. Previously his blood pressure had been running very low and that was titrating medicines very gingerly. He presents now for followup and actually has been doing quite well. He's had no shortness of breath, PND or orthopnea. He's had no chest pressure, neck or arm discomfort. He's had no palpitations, presyncope or syncope. He is trying to watch his salt but he admits that the food he eats does have salt in it. He's not lost any weight. Of note his blood pressure is elevated today and actually has been above 120 systolic and 70s diastolic over previous recent visits with his primary physician. I do note that his cholesterol last was LDL 57 HDL 32 and December. At that time his hemoglobin A1c was 10.1 with a creatinine of 1.58.  Current Medications (verified): 1)  Actos 30 Mg Tabs (Pioglitazone Hcl) .[name].. 1 By Mouth Once Daily 2)  Januvia 100 Mg Tabs (Sitagliptin Phosphate) .[name].. 1 By Mouth At Bedtime 3)  Trilipix 135 Mg Cpdr (Choline Fenofibrate) .[name].. 1 By Mouth Once Daily 4)  Vytorin 10-40 Mg Tabs (Ezetimibe-Simvastatin) .... Take One Tablet By Mouth Dailyat Bedtime 5)  Glimepiride 4 Mg Tabs (Glimepiride) .[name].. 1 By Mouth Two Times A Day 6)  Carvedilol 6.25 Mg Tabs (Carvedilol) .... Take One Tablet By Mouth Twice A Day 7)  Metformin Hcl 500 Mg Tabs (Metformin Hcl) .... 2 Tabs Two Times A Day 8)  Aspirin Ec 325 Mg Tbec (Aspirin) .... Take One Tablet By Mouth Daily 9)  Welchol 625 Mg Tabs (Colesevelam Hcl) .... Take 3 Tablets By Mouth Once Daily 10)  Fish Oil   Oil (Fish Oil) .... 3 Capsule Once Daily 11)  Vitamin D3 1000 Unit Caps (Cholecalciferol) .[name].. 1 By Mouth Once Daily 12)  Lantus 100 Unit/ml Soln (Insulin Glargine) .... As Directed 13)  Furosemide 40 Mg Tabs (Furosemide) .[name].. 1 By  Mouth Daily  Allergies (verified): 1)  ! Nitroglycerin 2)  ! * No Ivp Dye Allergy 3)  ! * No Shellfish Allergy 4)  ! * No Latex Allergy  Past History:  Past Medical History: Reviewed history from 01/16/2009 and no changes required. PACEMAKER (ICD-V45.[name]01) MYOCARDIAL INFARCTION (ICD-410.90) HYPERTROPHIC OBSTRUCTIVE CARDIOMYOPATHY (ICD-425.1) CARDIOMYOPATHY, SECONDARY (ICD-425.9) DM (ICD-250.00) HYPERTENSION, UNSPECIFIED (ICD-401.9) HYPERLIPIDEMIA-MIXED (ICD-272.4) BRADYCARDIA (ICD-427.89) AV BLOCK, COMPLETE (ICD-426.0)    Past Surgical History: Pacemaker - medtronic  Review of Systems       As stated in the HPI and negative for all other systems.   Vital Signs:  Patient profile:   55 year old male Height:      73 inches Weight:      279 pounds BMI:     36.94 Pulse rate:   71 / minute Resp:     16 per minute BP sitting:   140 / 90  (right arm)  Vitals Entered By: Marrion Coy, CNA (January 07, 2010 2:49 PM)  Physical Exam  General:  Well developed, well nourished, in no acute distress. Head:  normocephalic and atraumatic Eyes:  PERRLA/EOM intact; conjunctiva and lids normal. Mouth:  Edentulous.. Oral mucosa normal. Neck:  Neck supple, no JVD. No masses, thyromegaly or abnormal cervical nodes. Chest Wall:  well-healed ICD pocket Lungs:  Clear bilaterally to auscultation and percussion. Abdomen:  Bowel sounds positive; abdomen soft and non-tender without masses,  organomegaly, or hernias noted. No hepatosplenomegaly, obese Msk:  Back normal, normal gait. Muscle strength and tone normal. Extremities:  mild bilateral edema Neurologic:  Alert and oriented x 3. Skin:  Intact without lesions or rashes. Cervical Nodes:  no significant adenopathy Axillary Nodes:  no significant adenopathy Psych:  Normal affect.   Detailed Cardiovascular Exam  Neck    Carotids: Carotids full and equal bilaterally without bruits.      Neck Veins: Normal, no JVD.    Heart     Inspection: no deformities or lifts noted.      Palpation: normal PMI with no thrills palpable.      Auscultation: regular rate and rhythm, S1, S2 without murmurs, rubs, gallops, or clicks.    Vascular    Abdominal Aorta: no palpable masses, pulsations, or audible bruits.      Femoral Pulses: normal femoral pulses bilaterally.      Pedal Pulses: normal pedal pulses bilaterally.      Radial Pulses: normal radial pulses bilaterally.      Peripheral Circulation: no clubbing, cyanosis, or edema noted with normal capillary refill.     EKG  Procedure date:  01/07/2010  Findings:      sinus rhythm with ventricular pacing 100% capture rate 70  PPM Specifications Following MD:  Lewayne Bunting, MD     PPM Vendor:  Medtronic     PPM Model Number:  ADDR01     PPM Serial Number:  [id number] PPM DOI:  08/19/2006     PPM Implanting MD:  Lewayne Bunting, MD  Lead 1    Location: RA     DOI: 08/19/2006     Model #: 0347     Serial #: [id number]     Status: active Lead 2    Location: RV     DOI: 08/19/2006     Model #: 6433     Serial #: [id number]     Status: active  Magnet Response Rate:  BOL 85 ERI  65  Indications:  CHB Pacemaker dependent  Explantation Comments:  02/21/2009 Addapta [id number] H EXPLANTED  PPM Follow Up Pacer Dependent:  Yes      Episodes Coumadin:  No  Parameters Mode:  DDDR     Lower Rate Limit:  60     Upper Rate Limit:  130 Paced AV Delay:  200     Sensed AV Delay:  170  ICD Specifications Following MD:  Lewayne Bunting, MD     Referring MD:  [facility] ICD Vendor:  Medtronic     ICD Model Number:  [id number]     ICD Serial Number:  [id number] ICD DOI:  02/21/2009     ICD Implanting MD:  Lewayne Bunting, MD  Lead 1:    Location: RA     DOI: 08/19/2006     Model #: 3220     Serial #: [id number]     Status: active Lead 2:    Location: RV     DOI: 02/21/2009     Model #: 6283     Serial #: [id number]     Status: active Lead 3:    Location: LV     DOI: 02/21/2009     Model #:  7371     Serial #: [id number]     Status: active  Indications::  CM   ICD Follow Up ICD Dependent:  Yes       ICD Device Measurements Configuration: LV TIP TO RV COIL  Episodes Coumadin:  No  Brady Parameters Mode DDDR     Lower Rate Limit:  60     Upper Rate Limit 130 PAV 130     Sensed AV Delay:  100  Tachy Zones VF:  240     VT:  200     VT1:  171     Impression & Recommendations:  Problem # 1:  CARDIOMYOPATHY, SECONDARY (ICD-425.9) Today it seems that I can uptitrate his medications and I will increase his carvedilol to 12.5 mg b.i.d. I will consider an ACE or ARB in the future but need to be cautious given his renal insufficiency.  Problem # 2:  RENAL INSUFFICIENCY (ICD-588.9) I will send the patient for a basic metabolic profile.  Problem # 3:  OBESITY, UNSPECIFIED (ICD-278.00) The patient has lost no weight. I gave him specific instructions on dietary restrictions to help him lose his weight and hopefully improve his blood sugar which is not well-controlled.

## 2010-11-03 NOTE — Miscellaneous (Signed)
Summary: Orders Update  Clinical Lists Changes  Medications: Changed medication from CARVEDILOL 6.25 MG TABS (CARVEDILOL) Take one tablet by mouth twice a day to CARVEDILOL 12.5 MG TABS (CARVEDILOL) one two times a day - Signed Rx of CARVEDILOL 12.5 MG TABS (CARVEDILOL) one two times a day;  #180 x 3;  Signed;  Entered by: Charolotte Capuchin, RN;  Authorized by: Rollene Rotunda, MD, [facility];  Method used: Electronically to [facility]*, , ,   , Ph: [phone], Fax: [phone] Orders: Added new Service order of EKG w/ Interpretation (93000) - Signed Observations: Added new observation of PI CARDIO: Your physician recommends that you schedule a follow-up appointment in: 6 weeks Your physician has recommended you make the following change in your medication: increase Carvedilol to 12.5 mg twice a day Your physician recommends that you have lab work  BMP 428.0 v58.69 (01/07/2010 15:45)    Prescriptions: CARVEDILOL 12.5 MG TABS (CARVEDILOL) one two times a day  #180 x 3   Entered by:   Charolotte Capuchin, RN   Authorized by:   Rollene Rotunda, MD, [facility]   Signed by:   Charolotte Capuchin, RN on 01/07/2010   Method used:   Electronically to        [facility]* (mail-order)             ,          Ph: [phone]       Fax: [phone]   RxID:   [id number]    Patient Instructions: 1)  Your physician recommends that you schedule a follow-up appointment in: 6 weeks 2)  Your physician has recommended you make the following change in your medication: increase Carvedilol to 12.5 mg twice a day 3)  Your physician recommends that you have lab work  BMP 428.0 v58.69

## 2010-11-15 ENCOUNTER — Encounter (INDEPENDENT_AMBULATORY_CARE_PROVIDER_SITE_OTHER): Payer: Self-pay | Admitting: *Deleted

## 2010-11-25 NOTE — Letter (Signed)
Summary: Remote Device Check  [facility], Main Office  [address]   [place], [place] [address]   Phone: [phone]  Fax: [phone]     November 15, 2010 MRN: [id number]   LORY GALAN [address] [place], [place]  [address]   Dear Mr. RAWL,   Your remote transmission was recieved and reviewed by your physician.  All diagnostics were within normal limits for you.  __X____Your next office visit is scheduled for: April 2012 with Dr [name]. Please call our office to schedule an appointment.    Sincerely,  Vella Kohler

## 2010-11-25 NOTE — Cardiovascular Report (Signed)
Summary: Office Visit Remote   Office Visit Remote   Imported By: Roderic Ovens 11/17/2010 14:55:23  _____________________________________________________________________  External Attachment:    Type:   Image     Comment:   External Document

## 2010-12-04 ENCOUNTER — Encounter: Payer: Self-pay | Admitting: Cardiology

## 2011-01-12 LAB — BASIC METABOLIC PANEL
BUN: 45 mg/dL — ABNORMAL HIGH (ref 6–23)
BUN: 48 mg/dL — ABNORMAL HIGH (ref 6–23)
BUN: 48 mg/dL — ABNORMAL HIGH (ref 6–23)
BUN: 49 mg/dL — ABNORMAL HIGH (ref 6–23)
CO2: 19 mEq/L (ref 19–32)
CO2: 25 mEq/L (ref 19–32)
CO2: 26 mEq/L (ref 19–32)
Calcium: 9 mg/dL (ref 8.4–10.5)
Calcium: 9.1 mg/dL (ref 8.4–10.5)
Calcium: 9.3 mg/dL (ref 8.4–10.5)
Calcium: 9.7 mg/dL (ref 8.4–10.5)
Chloride: 106 mEq/L (ref 96–112)
Creatinine, Ser: 1.98 mg/dL — ABNORMAL HIGH (ref 0.4–1.5)
Creatinine, Ser: 2.17 mg/dL — ABNORMAL HIGH (ref 0.4–1.5)
Creatinine, Ser: 2.33 mg/dL — ABNORMAL HIGH (ref 0.4–1.5)
GFR calc Af Amer: 52 mL/min — ABNORMAL LOW (ref >60)
GFR calc non Af Amer: 29 mL/min — ABNORMAL LOW (ref >60)
GFR calc non Af Amer: 32 mL/min — ABNORMAL LOW (ref >60)
GFR calc non Af Amer: 36 mL/min — ABNORMAL LOW (ref >60)
GFR calc non Af Amer: 43 mL/min — ABNORMAL LOW (ref >60)
Glucose, Bld: 206 mg/dL — ABNORMAL HIGH (ref 70–99)
Glucose, Bld: 78 mg/dL (ref 70–99)
Glucose, Bld: 99 mg/dL (ref 70–99)
Potassium: 4.4 mEq/L (ref 3.5–5.1)
Potassium: 6.3 mEq/L (ref 3.5–5.1)
Sodium: 137 mEq/L (ref 135–145)
Sodium: 141 mEq/L (ref 135–145)
Sodium: 141 mEq/L (ref 135–145)

## 2011-01-12 LAB — CBC
HCT: 37.2 % — ABNORMAL LOW (ref 39.0–52.0)
HCT: 40.4 % (ref 39.0–52.0)
Hemoglobin: 12.7 g/dL — ABNORMAL LOW (ref 13.0–17.0)
Hemoglobin: 12.8 g/dL — ABNORMAL LOW (ref 13.0–17.0)
Hemoglobin: 13.8 g/dL (ref 13.0–17.0)
MCHC: 34.1 g/dL (ref 30.0–36.0)
MCHC: 34.3 g/dL (ref 30.0–36.0)
Platelets: 134 10*3/uL — ABNORMAL LOW (ref 150–400)
Platelets: 172 10*3/uL (ref 150–400)
RBC: 4.17 MIL/uL — ABNORMAL LOW (ref 4.22–5.81)
RDW: 13 % (ref 11.5–15.5)
RDW: 13.1 % (ref 11.5–15.5)

## 2011-01-12 LAB — GLUCOSE, CAPILLARY
Glucose-Capillary: 100 mg/dL — ABNORMAL HIGH (ref 70–99)
Glucose-Capillary: 108 mg/dL — ABNORMAL HIGH (ref 70–99)
Glucose-Capillary: 123 mg/dL — ABNORMAL HIGH (ref 70–99)
Glucose-Capillary: 244 mg/dL — ABNORMAL HIGH (ref 70–99)
Glucose-Capillary: 73 mg/dL (ref 70–99)
Glucose-Capillary: 96 mg/dL (ref 70–99)

## 2011-01-12 LAB — DIFFERENTIAL
Basophils Absolute: 0.1 10*3/uL (ref 0.0–0.1)
Basophils Relative: 1 % (ref 0–1)
Eosinophils Absolute: 0.3 10*3/uL (ref 0.0–0.7)
Eosinophils Relative: 4 % (ref 0–5)
Monocytes Absolute: 0.6 10*3/uL (ref 0.1–1.0)

## 2011-01-13 LAB — GLUCOSE, CAPILLARY
Glucose-Capillary: 108 mg/dL — ABNORMAL HIGH (ref 70–99)
Glucose-Capillary: 135 mg/dL — ABNORMAL HIGH (ref 70–99)
Glucose-Capillary: 148 mg/dL — ABNORMAL HIGH (ref 70–99)
Glucose-Capillary: 202 mg/dL — ABNORMAL HIGH (ref 70–99)
Glucose-Capillary: 205 mg/dL — ABNORMAL HIGH (ref 70–99)
Glucose-Capillary: 313 mg/dL — ABNORMAL HIGH (ref 70–99)
Glucose-Capillary: 72 mg/dL (ref 70–99)

## 2011-01-13 LAB — HEMOGLOBIN A1C: Hgb A1c MFr Bld: 7.1 % — ABNORMAL HIGH (ref 4.6–6.1)

## 2011-01-13 LAB — CBC
HCT: 39.7 % (ref 39.0–52.0)
HCT: 40 % (ref 39.0–52.0)
HCT: 40.5 % (ref 39.0–52.0)
Hemoglobin: 13.4 g/dL (ref 13.0–17.0)
MCHC: 33.7 g/dL (ref 30.0–36.0)
MCV: 91.7 fL (ref 78.0–100.0)
Platelets: 155 10*3/uL (ref 150–400)
Platelets: 161 10*3/uL (ref 150–400)
RBC: 4.31 MIL/uL (ref 4.22–5.81)
RBC: 4.41 MIL/uL (ref 4.22–5.81)
WBC: 7.1 10*3/uL (ref 4.0–10.5)
WBC: 7.9 10*3/uL (ref 4.0–10.5)

## 2011-01-13 LAB — CARDIAC PANEL(CRET KIN+CKTOT+MB+TROPI)
CK, MB: 3.4 ng/mL (ref 0.3–4.0)
Relative Index: 3.1 — ABNORMAL HIGH (ref 0.0–2.5)
Total CK: 106 U/L (ref 7–232)
Troponin I: 0.53 ng/mL (ref 0.00–0.06)

## 2011-01-13 LAB — POCT I-STAT 3, VENOUS BLOOD GAS (G3P V)
Acid-Base Excess: 1 mmol/L (ref 0.0–2.0)
Acid-base deficit: 1 mmol/L (ref 0.0–2.0)
Bicarbonate: 24.2 mEq/L — ABNORMAL HIGH (ref 20.0–24.0)
Bicarbonate: 25.7 mEq/L — ABNORMAL HIGH (ref 20.0–24.0)
O2 Saturation: 55 %
O2 Saturation: 57 %
TCO2: 27 mmol/L (ref 0–100)
pO2, Ven: 30 mmHg (ref 30.0–45.0)
pO2, Ven: 30 mmHg (ref 30.0–45.0)

## 2011-01-13 LAB — COMPREHENSIVE METABOLIC PANEL
AST: 19 U/L (ref 0–37)
AST: 21 U/L (ref 0–37)
Albumin: 3.3 g/dL — ABNORMAL LOW (ref 3.5–5.2)
Albumin: 3.3 g/dL — ABNORMAL LOW (ref 3.5–5.2)
Alkaline Phosphatase: 30 U/L — ABNORMAL LOW (ref 39–117)
BUN: 22 mg/dL (ref 6–23)
Calcium: 9.4 mg/dL (ref 8.4–10.5)
Chloride: 106 mEq/L (ref 96–112)
Chloride: 114 mEq/L — ABNORMAL HIGH (ref 96–112)
Creatinine, Ser: 1.03 mg/dL (ref 0.4–1.5)
Creatinine, Ser: 1.38 mg/dL (ref 0.4–1.5)
GFR calc Af Amer: >60 mL/min (ref >60)
GFR calc Af Amer: >60 mL/min (ref >60)
GFR calc non Af Amer: 54 mL/min — ABNORMAL LOW (ref >60)
Potassium: 4.2 mEq/L (ref 3.5–5.1)
Sodium: 144 mEq/L (ref 135–145)
Total Bilirubin: 0.7 mg/dL (ref 0.3–1.2)
Total Bilirubin: 0.9 mg/dL (ref 0.3–1.2)

## 2011-01-13 LAB — POCT CARDIAC MARKERS
CKMB, poc: 2 ng/mL (ref 1.0–8.0)
Myoglobin, poc: 59 ng/mL (ref 12–200)
Myoglobin, poc: 95.3 ng/mL (ref 12–200)
Troponin i, poc: 0.28 ng/mL (ref 0.00–0.09)

## 2011-01-13 LAB — CK TOTAL AND CKMB (NOT AT ARMC): Relative Index: 2.6 — ABNORMAL HIGH (ref 0.0–2.5)

## 2011-01-13 LAB — DIFFERENTIAL
Basophils Absolute: 0.1 10*3/uL (ref 0.0–0.1)
Eosinophils Relative: 4 % (ref 0–5)
Lymphocytes Relative: 19 % (ref 12–46)
Monocytes Absolute: 0.6 10*3/uL (ref 0.1–1.0)

## 2011-01-13 LAB — BASIC METABOLIC PANEL
BUN: 20 mg/dL (ref 6–23)
BUN: 21 mg/dL (ref 6–23)
Chloride: 104 mEq/L (ref 96–112)
Creatinine, Ser: 1.28 mg/dL (ref 0.4–1.5)
GFR calc Af Amer: >60 mL/min (ref >60)
GFR calc non Af Amer: 56 mL/min — ABNORMAL LOW (ref >60)
GFR calc non Af Amer: 59 mL/min — ABNORMAL LOW (ref >60)
Glucose, Bld: 111 mg/dL — ABNORMAL HIGH (ref 70–99)
Potassium: 3.5 mEq/L (ref 3.5–5.1)
Potassium: 3.8 mEq/L (ref 3.5–5.1)
Sodium: 140 mEq/L (ref 135–145)

## 2011-01-13 LAB — POCT I-STAT 3, ART BLOOD GAS (G3+)
Acid-base deficit: 1 mmol/L (ref 0.0–2.0)
Bicarbonate: 23.8 mEq/L (ref 20.0–24.0)
O2 Saturation: 91 %
TCO2: 25 mmol/L (ref 0–100)
pO2, Arterial: 62 mmHg — ABNORMAL LOW (ref 80.0–100.0)

## 2011-01-13 LAB — BRAIN NATRIURETIC PEPTIDE: Pro B Natriuretic peptide (BNP): 625 pg/mL — ABNORMAL HIGH (ref 0.0–100.0)

## 2011-01-13 LAB — PROTIME-INR: INR: 1.2 (ref 0.00–1.49)

## 2011-01-13 LAB — APTT: aPTT: 56 seconds — ABNORMAL HIGH (ref 24–37)

## 2011-01-13 LAB — HEPARIN LEVEL (UNFRACTIONATED): Heparin Unfractionated: 0.33 IU/mL (ref 0.30–0.70)

## 2011-01-18 ENCOUNTER — Other Ambulatory Visit: Payer: Self-pay | Admitting: Gastroenterology

## 2011-01-26 ENCOUNTER — Encounter: Payer: Self-pay | Admitting: Internal Medicine

## 2011-01-27 ENCOUNTER — Encounter: Payer: Self-pay | Admitting: Internal Medicine

## 2011-01-27 ENCOUNTER — Ambulatory Visit (INDEPENDENT_AMBULATORY_CARE_PROVIDER_SITE_OTHER): Payer: BC Managed Care – PPO | Admitting: Internal Medicine

## 2011-01-27 DIAGNOSIS — Z95810 Presence of automatic (implantable) cardiac defibrillator: Secondary | ICD-10-CM

## 2011-01-27 DIAGNOSIS — I5022 Chronic systolic (congestive) heart failure: Secondary | ICD-10-CM

## 2011-01-27 DIAGNOSIS — I442 Atrioventricular block, complete: Secondary | ICD-10-CM

## 2011-01-27 DIAGNOSIS — I509 Heart failure, unspecified: Secondary | ICD-10-CM

## 2011-01-27 NOTE — Patient Instructions (Signed)
Your physician wants you to follow-up in: 12 months with Dr Taylor You will receive a reminder letter in the mail two months in advance. If you don't receive a letter, please call our office to schedule the follow-up appointment.  Remote monitoring is used to monitor your Pacemaker of ICD from home. This monitoring reduces the number of office visits required to check your device to one time per year. It allows us to keep an eye on the functioning of your device to ensure it is working properly. You are scheduled for a device check from home on 04/29/11 You may send your transmission at any time that day. If you have a wireless device, the transmission will be sent automatically. After your physician reviews your transmission, you will receive a postcard with your next transmission date.    

## 2011-01-27 NOTE — Progress Notes (Signed)
HPI [name] returns today for followup. She is a pleasant middle-aged man with an ischemic cardiomyopathy, congestive heart failure, left bundle branch block, status post biventricular ICD. He has had no intercurrent ICD shocks. He denies chest pain or shortness of breath. He has had no peripheral edema. No syncope. Allergies  Allergen Reactions  . Nitroglycerin      Current Outpatient Prescriptions  Medication Sig Dispense Refill  . aspirin 325 MG tablet Take 325 mg by mouth daily.        . carvedilol (COREG) 25 MG tablet Take 25 mg by mouth 2 (two) times daily with a meal.        . Cholecalciferol (VITAMIN D3) 1000 UNITS CAPS Take by mouth daily.        . Choline Fenofibrate (TRILIPIX) 135 MG capsule Take 135 mg by mouth daily.        . colesevelam (WELCHOL) 625 MG tablet Take 1,875 mg by mouth daily.        [name] ezetimibe-simvastatin (VYTORIN) 10-40 MG per tablet Take 1 tablet by mouth at bedtime.        . fish oil-omega-3 fatty acids 1000 MG capsule Take 3 capsules by mouth daily.        . furosemide (LASIX) 40 MG tablet Take 40 mg by mouth daily.        [name] glimepiride (AMARYL) 4 MG tablet Take 4 mg by mouth 2 (two) times daily.        . insulin glargine (LANTUS) 100 UNIT/ML injection Inject into the skin as directed.        [name] lisinopril (PRINIVIL,ZESTRIL) 2.5 MG tablet Take 2.5 mg by mouth daily.        . metFORMIN (GLUMETZA) 500 MG (MOD) 24 hr tablet Take 1,000 mg by mouth 2 (two) times daily with a meal.       . pioglitazone (ACTOS) 30 MG tablet Take 30 mg by mouth daily.        . sitaGLIPtan (JANUVIA) 100 MG tablet Take 100 mg by mouth daily.           Past Medical History  Diagnosis Date  . DM   . HYPERLIPIDEMIA-MIXED   . Obesity, unspecified   . HYPERTENSION, UNSPECIFIED   . MYOCARDIAL INFARCTION   . Hypertrophic obstructive cardiomyopathy   . CARDIOMYOPATHY, SECONDARY   . AV BLOCK, COMPLETE   . RENAL INSUFFICIENCY   . AUTOMATIC IMPLANTABLE CARDIAC DEFIBRILLATOR SITU      ROS:   All systems reviewed and negative except as noted in the HPI.   Past Surgical History  Procedure Date  . Pacemaker insertion     medtronic     Family History  Problem Relation Age of Onset  . Diabetic kidney disease Father      History   Social History  . Marital Status: Married    Spouse Name: N/A    Number of Children: N/A  . Years of Education: N/A   Occupational History  . Not on file.   Social History Main Topics  . Smoking status: Current Everyday Smoker  . Smokeless tobacco: Not on file  . Alcohol Use: Yes     rarely  . Drug Use: Not on file  . Sexually Active: Not on file   Other Topics Concern  . Not on file   Social History Narrative  . No narrative on file     BP 104/68  Pulse 83  Ht 6\' 1"  (1.854 m)  Wt 278 lb (  126.1 kg)  BMI 36.68 kg/m2  Physical Exam:  Well appearing NAD HEENT: Unremarkable Neck:  No JVD, no thyromegally Lymphatics:  No adenopathy Back:  No CVA tenderness Lungs:  Clear. Well-healed defibrillator incision HEART:  Regular rate rhythm, no murmurs, no rubs, no clicks Abd:  Flat, positive bowel sounds, no organomegally, no rebound, no guarding Ext:  2 plus pulses, no edema, no cyanosis, no clubbing Skin:  No rashes no nodules Neuro:  CN II through XII intact, motor grossly intact  DEVICE  Normal device function.  See PaceArt for details.   Assess/Plan:

## 2011-01-27 NOTE — Assessment & Plan Note (Signed)
His device is working normally. We'll recheck in several months. 

## 2011-01-27 NOTE — Assessment & Plan Note (Signed)
His symptoms are class II. He will continue his current medications and maintain a low-sodium diet. 

## 2011-01-30 ENCOUNTER — Encounter: Payer: Self-pay | Admitting: Cardiology

## 2011-02-03 ENCOUNTER — Ambulatory Visit (INDEPENDENT_AMBULATORY_CARE_PROVIDER_SITE_OTHER): Payer: BC Managed Care – PPO | Admitting: Cardiology

## 2011-02-03 ENCOUNTER — Encounter: Payer: Self-pay | Admitting: Cardiology

## 2011-02-03 VITALS — BP 106/72 | HR 66 | Ht 73.0 in | Wt 275.0 lb

## 2011-02-03 DIAGNOSIS — E785 Hyperlipidemia, unspecified: Secondary | ICD-10-CM

## 2011-02-03 DIAGNOSIS — Z95810 Presence of automatic (implantable) cardiac defibrillator: Secondary | ICD-10-CM

## 2011-02-03 DIAGNOSIS — I10 Essential (primary) hypertension: Secondary | ICD-10-CM

## 2011-02-03 DIAGNOSIS — I429 Cardiomyopathy, unspecified: Secondary | ICD-10-CM

## 2011-02-03 DIAGNOSIS — N259 Disorder resulting from impaired renal tubular function, unspecified: Secondary | ICD-10-CM

## 2011-02-03 DIAGNOSIS — I442 Atrioventricular block, complete: Secondary | ICD-10-CM

## 2011-02-03 DIAGNOSIS — E669 Obesity, unspecified: Secondary | ICD-10-CM

## 2011-02-03 NOTE — Assessment & Plan Note (Signed)
I reviewed his lipomed profile.  He was confused about instructions to change to Crestor. I will defer to Monica Becton, MD And he will talk to his office today to clarify.

## 2011-02-03 NOTE — Assessment & Plan Note (Signed)
He is up-to-date with his followup.

## 2011-02-03 NOTE — Assessment & Plan Note (Signed)
His last ejection fraction 2003 and was only mildly reduced. No further imaging is indicated.

## 2011-02-03 NOTE — Assessment & Plan Note (Signed)
His last creatinine was 1.24. No change in therapy is indicated.

## 2011-02-03 NOTE — Progress Notes (Signed)
HPI The patient presents for followup of his cardiomyopathy and coronary disease. Since I last saw him he has been doing well. He dug a ditch recently and he had no chest discomfort or shortness of breath with this. He denies any palpitations, presyncope or syncope. He has had no PND or orthopnea. He has had no weight gain or edema and he is up-to-date with his ICD followup.  Allergies  Allergen Reactions  . Nitroglycerin     Current Outpatient Prescriptions  Medication Sig Dispense Refill  . aspirin 325 MG tablet Take 325 mg by mouth daily.        . carvedilol (COREG) 25 MG tablet Take 25 mg by mouth 2 (two) times daily with a meal.        . Cholecalciferol (VITAMIN D3) 1000 UNITS CAPS Take by mouth daily.        . Choline Fenofibrate (TRILIPIX) 135 MG capsule Take 135 mg by mouth daily.        . colesevelam (WELCHOL) 625 MG tablet Take 1,875 mg by mouth daily.        [name] ezetimibe-simvastatin (VYTORIN) 10-40 MG per tablet Take 1 tablet by mouth at bedtime.        . fish oil-omega-3 fatty acids 1000 MG capsule Take 3 capsules by mouth daily.        . furosemide (LASIX) 40 MG tablet Take 40 mg by mouth daily.        [name] glimepiride (AMARYL) 4 MG tablet Take 4 mg by mouth 2 (two) times daily.        . insulin glargine (LANTUS) 100 UNIT/ML injection Inject into the skin as directed.        [name] lisinopril (PRINIVIL,ZESTRIL) 2.5 MG tablet Take 2.5 mg by mouth daily.        . metFORMIN (GLUMETZA) 500 MG (MOD) 24 hr tablet Take 1,000 mg by mouth 2 (two) times daily with a meal.       . pioglitazone (ACTOS) 30 MG tablet Take 30 mg by mouth daily.        . sitaGLIPtan (JANUVIA) 100 MG tablet Take 100 mg by mouth daily.        . Vitamin D, Ergocalciferol, (DRISDOL) 50000 UNITS CAPS       . DISCONTD: PEG 3350-KCl-NaBcb-NaCl-NaSulf (PEG 3350/ELECTROLYTES) 240 G SOLR         Past Medical History  Diagnosis Date  . DM   . HYPERLIPIDEMIA-MIXED   . Obesity, unspecified   . HYPERTENSION, UNSPECIFIED   .  MYOCARDIAL INFARCTION   . Hypertrophic obstructive cardiomyopathy     Poor acoustic windows on echo 2010  . CARDIOMYOPATHY, SECONDARY   . AV BLOCK, COMPLETE   . RENAL INSUFFICIENCY   . AUTOMATIC IMPLANTABLE CARDIAC DEFIBRILLATOR SITU     Past Surgical History  Procedure Date  . Pacemaker insertion     medtronic    ROS:  As stated in the HPI and negative for all other systems.  PHYSICAL EXAM BP 106/72  Pulse 66  Ht 6\' 1"  (1.854 m)  Wt 275 lb (124.739 kg)  BMI 36.28 kg/m2 GENERAL:  Well appearing HEENT:  Pupils equal round and reactive, fundi not visualized, oral mucosa unremarkable, edentulous NECK:  No jugular venous distention, waveform within normal limits, carotid upstroke brisk and symmetric, no bruits, no thyromegaly LYMPHATICS:  No cervical, inguinal adenopathy LUNGS:  Clear to auscultation bilaterally BACK:  No CVA tenderness CHEST:  Well healed device pocket HEART:  PMI not displaced  or sustained,S1 and S2 within normal limits, no S3, no S4, no clicks, no rubs, no murmurs ABD:  Flat, positive bowel sounds normal in frequency in pitch, no bruits, no rebound, no guarding, no midline pulsatile mass, no hepatomegaly, no splenomegaly, obese EXT:  2 plus pulses throughout, trace edema, no cyanosis no clubbing SKIN:  No rashes no nodules NEURO:  Cranial nerves II through XII grossly intact, motor grossly intact throughout PSYCH:  Cognitively intact, oriented to person place and time   EKG:  Sinus with paced ventricular rhythm  ASSESSMENT AND PLAN

## 2011-02-03 NOTE — Assessment & Plan Note (Signed)
His blood pressure is actually low and does not allow for titration. We will continue meds as listed.

## 2011-02-03 NOTE — Assessment & Plan Note (Signed)
The patient understands the need to lose weight with diet and exercise. We have discussed specific strategies for this.  

## 2011-02-03 NOTE — Patient Instructions (Signed)
Continue your current medications Follow up in 7 months with Dr Antoine Poche in [place] (09/2011)

## 2011-02-10 ENCOUNTER — Encounter: Payer: Self-pay | Admitting: Family Medicine

## 2011-02-16 NOTE — Op Note (Signed)
[name], DETTMAN NO.:  [id number]   MEDICAL RECORD NO.:  [id number]          PATIENT TYPE:  INP   LOCATION:  4712                         FACILITY:  [facility]   PHYSICIAN:  Doylene Canning. [name], MD    DATE OF BIRTH:  [birth year]   DATE OF PROCEDURE:  02/21/2009  DATE OF DISCHARGE:  02/22/2009                               OPERATIVE REPORT   PROCEDURE PERFORMED:  Upgrade of a dual-chamber pacemaker to a BiV ICD.   INDICATION:  Class III heart failure with complete heart block and acute  and longstanding cardiomyopathy (hypertrophic obstructive  cardiomyopathy).   INTRODUCTION:  The patient is a 55 year old man who has had worsening  heart failure over the last several months.  He has complete heart block  and pacing induced left bundle-branch block and subsequently developed  worsening congestive heart failure.  He is now referred for upgrade to a  BiV ICD from his dual-chamber pacemaker.   PROCEDURE:  After informed consent was obtained, the patient was taken  to the diagnostic EP lab in a fasting state.  After usual preparation  and draping, intravenous fentanyl and midazolam was given for sedation.  Lidocaine 30 mL was infiltrated in the left infraclavicular region.  A 7-  cm incision was carried out over this region and electrocautery was  utilized to dissect down to the fascial plane.  Care was taken not to  enter the prior subcutaneous pocket.  The left subclavian vein was  punctured x2 and the St. Jude model 7121 65-cm active fixation  defibrillation leads, serial number [id number] was advanced into the  right ventricle where mapping was carried out and at the final site the  R-wave was measured 7 millivolts, the pacing impedance was 600 ohms, and  threshold 0.5 volts at 0.5 milliseconds.  With lead actively fixed and  pronounced injury current was present and with these satisfactory  parameters and 10-volt pacing not stimulating the diaphragm, attention  was  then turned to placement of the left ventricular lead.  A MB II  Medtronic guiding catheter was advanced into the subclavian vein along  with a 6-French X-port EP catheter and then advanced into the right  atrium.  Coronary sinus was cannulated without particular difficulty.  Venography of coronary sinus was carried out.  This demonstrated a nice  lateral vein, which was utilized for LV lead placement.  The Medtronic  model 4194 88-cm passive fixation LV pacing lead serial number [id number]  [id number] was advanced into the lateral vein where the LV wave was  measured 10 mV, impedance was 1000 ohms, and threshold was 2.5 volts at  0.5 milliseconds.  10-volt pacing did not stimulate the diaphragm.  With  these satisfactory parameters, the guiding catheter was removed and the  left ventricular lead was allowed to remain in place.  Both LV lead and  the defibrillation lead were secured to subpectoralis fascia with a  figure-of-eight silk suture and sewing sleeve was secured with silk  suture.  At this point, electrocautery was utilized to enter the old  pacemaker pocket and the old device  was removed.  The old RV lead was  capped.  The atrial lead was working satisfactorily with P-waves of 3  and pacing threshold of 1 volt at 0.1 milliseconds.  At this point, the  subcutaneous pocket was revised to accommodate the larger defibrillator  and the new Medtronic CONCERTA II BiV ICD serial number [id number] was  connected to the new defibrillator lead and new LV and old right atrial  lead.  The old RV lead was placed back in the pocket as was the new  device.  The pocket was irrigated with gentamicin.  Electrocautery was  utilized to assure hemostasis, and the patient was more deeply sedated  for defibrillation threshold testing.   After the patient was more deeply sedated fentanyl and Versed, VF was  induced with a T-wave shock and a 20-joule shock was delivered, which  terminated VF and restored sinus  rhythm.  At this point, no additional  defibrillation threshold testing was carried out and the incision was  closed with 2-0 and 3-0 Vicryl.  Benzoin and Steri-Strips were painted  on the skin, pressure dressing was placed.  The patient was returned to  his room in satisfactory condition.  There were no immediate procedure  complications.   RESULTS:  Demonstrate successful upgrade from the dual-chamber pacemaker  to a BiV ICD in a patient with a nonischemic cardiomyopathy/hypertrophic  (pacing-induced left bundle-branch block with class III heart failure)      Doylene Canning. [name], MD  Electronically Signed     Doylene Canning. [name], MD  Electronically Signed    GWT/MEDQ  D:  05/15/2009  T:  05/15/2009  Job:  [id number]

## 2011-02-16 NOTE — Cardiovascular Report (Signed)
NAMESHAVON, ZENZ NO.:  [id number]   MEDICAL RECORD NO.:  [id number]          PATIENT TYPE:  INP   LOCATION:  4707                         FACILITY:  [facility]   PHYSICIAN:  Arturo Morton. Riley Kill, MD, FACCDATE OF BIRTH:  10-13-1955   DATE OF PROCEDURE:  01/28/2009  DATE OF DISCHARGE:                            CARDIAC CATHETERIZATION   INDICATIONS:  Mr. Dimarco is 55 years old.  He has had increasing  shortness of breath as of late.  The etiology of this is uncertain.  He  has known reduced overall LV function demonstrated at prior  catheterization.  Catheterization was done by Dr. Antoine Poche in 2006.  He  also has a pacemaker in place.  The current study was done to assess  coronary anatomy and right heart pressures.   PROCEDURE:  1. Right and left heart catheterization.  2. Selective coronary arteriography.  3. Selective left ventriculography.   DESCRIPTION OF PROCEDURE:  The patient was brought to the  catheterization laboratory and prepped and draped in usual fashion  following informed consent.  Through an anterior puncture using a Smart  needle, the femoral vein was entered and a 7-French sheath was placed.  Following this, the Swan-Ganz catheter was taken to the superior vena  cava where saturation was obtained.  This was followed by serial right  heart pressures.  Following this, a pulmonary artery saturation was  obtained.  Thermodilution cardiac outputs were then performed.  Following this, the femoral artery was then entered using a Smart needle  and a 5-French sheath was placed.  A pigtail catheter was then placed in  the central aorta and left ventricle.  Saturation was obtained.  Following this, simultaneous wedge LV was performed.  The right heart  catheter was then pulled back and a simultaneous RV and LV performed.  The Swan-Ganz was then removed.  Following this, ventriculography was  performed in the RAO projection with limited contrast due to  the  borderline increase in the creatinine.  Following this, views of the  left and right coronary arteries were obtained in several angiographic  projections with careful attention to reduce contrast load.  Following  this, we elected to upgrade the 5-French sheath to a 6-French sheath  because the LAD was not well visualized due to the amount of contrast  needed to opacify the LAD and the circumflex.  A Cordis L3.5 guiding  catheter was then utilized to inject.  Total of 77 mL of contrast was  utilized.  All catheters were subsequently removed.  We pointed out some  of the lesions to the patient.  We then gave the pictures to his wife.  Overall the patient tolerated procedure without complication.  He was  taken to the holding area in satisfactory clinical condition.   HEMODYNAMIC DATA:  1. Right atrial pressure 13.  2. RV 57/39.  3. Pulmonary artery 59/34, mean 48.  4. Pulmonary capillary wedge 36 mean V equals 40.  5. Aortic 105/68, mean 83.  6. LV 101/30-35.  7. Superior vena cava saturation 55%.  8. Pulmonary artery saturation 57%.  9. Aortic  saturation 91%.  10.Fick cardiac output 5.3 liters per minute.  11.Fick cardiac index 2.1 liters per minute per meter squared.  12.Thermodilution cardiac output 4.0 liters per minute.  13.Thermodilution cardiac index 1.6 liters per minute per meter      squared.   ANGIOGRAPHIC DATA:  1. Ventriculography was done in the RAO projection.  There appeared to      be at least moderate mitral regurgitation.  Ejection fraction      appeared to be in the 35-40% range with distal inferior and      anteroapical hypokinesis as noted on previous study.  2. The left main is free of critical disease.  3. The LAD is segmentally plaqued and has calcification proximally.      It is probably about 60% lesion in the proximal vessel followed by      70% in the proximal mid segment.  The LAD distally has moderate      luminal irregularities.  4. The  circumflex provides a small-to-moderate first marginal branch      which has some ostial narrowing of about 40-50.  There is then a      second marginal branch that an AV circumflex that goes into the      posterolateral segment.  This segment has about 50-70% narrowing.  5. The right coronary artery is relatively smooth throughout with      probably about 40% segmental plaque in the midvessel.  PD and PLA      were without critical narrowing.   CONCLUSION:  1. Pulmonary hypertension with elevated end-diastolic and pulmonary      capillary wedge pressures and moderate V-wave.  2. Angiographic mitral regurgitation.  3. Moderately severe reduction in global left ventricular function of      uncertain etiology.  4. Scattered coronary artery disease without critical disease of the      circumflex or right and moderate disease of the LAD as noted above.   DISPOSITION:  The patient will need to be diuresed.  We will need to pay  attention to his creatinine.  The patient clearly has a cardiomyopathy  that is out of proportion to his coronary artery disease and his  coronary artery disease has some significance.  It does not appear to be  critical.  Optimization of pressures would be warranted.  The patient  probably needs a sleep study.  He may need a transesophageal echo to  better assess the mitral valve.  It will be determined by Dr. Antoine Poche.  I have asked him to review the films since he will be following the  patient.  Other considerations might be for biventricular pacing.      Arturo Morton. Riley Kill, MD, [facility]  Electronically Signed     TDS/MEDQ  D:  01/28/2009  T:  01/29/2009  Job:  [id number]   cc:   Ernestina Penna, M.D.  Rollene Rotunda, MD, [place]  [facility]  Doylene Canning. [name], MD

## 2011-02-16 NOTE — Discharge Summary (Signed)
[name], MERLO NO.:  [id number]   MEDICAL RECORD NO.:  [id number]          PATIENT TYPE:  INP   LOCATION:  4707                         FACILITY:  [facility]   PHYSICIAN:  Rollene Rotunda, MD, FACCDATE OF BIRTH:  [birth year]   DATE OF ADMISSION:  01/27/2009  DATE OF DISCHARGE:  01/30/2009                               DISCHARGE SUMMARY   PRIMARY CARDIOLOGIST:  Rollene Rotunda, MD, [facility]   ELECTROPHYSIOLOGIST:  Doylene Canning. [name], MD   PRIMARY CARE PHYSICIAN:  Ernestina Penna, MD   DISCHARGE DIAGNOSES:  1. 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  2. Hypothyroidism (new diagnosis).   SECONDARY DIAGNOSES:  1. Nonobstructive coronary artery disease.  2. Hypertension.  3. Dyslipidemia.  4. Diabetes (requiring multiple medications).  5. History of syncope secondary to heart block (status post dual      chamber permanent pacemaker, Medtronic Adapta implant in 2007).   ALLERGIES:  NKDA.   PROCEDURES PERFORMED DURING THIS HOSPITALIZATION:  1. EKG performed on January 27, 2009, showing AV pacing at a rate of 60      BPM.  No significant change from prior tracings.  2. Chest x-ray performed on January 28, 2009, showing probable small      layering pleural effusions.  3. Cardiac cath performed on January 28, 2009, showing pulmonary      hypertension with elevated end diastolic and pulmonary capillary      wedge pressure.  4. Moderate T wave.  5. Angiographic mitral regurgitation.  6. Moderately severe reduction in global left ventricular function,      uncertain etiology.  7. Scattered CAD without critical disease of circumflex or right and      moderate disease of LAD.  LAD is segmentally plaqued and has      calcification proximally.  Probably about 60% lesion in the      proximal vessel followed by 70% in the proximal mid segment.      Distally, moderate luminal irregularities.  The patient had 2-D      echocardiogram on January 28, 2009, showing,  8. LV endocardium poorly visualized.  EF at least mildly decreased      with septal inferolateral hypokinesis.  At least moderate LVH, but      no obvious SAM and LVH.  9. Left atrium mildly dilated.  10.Atrial septum, normal.  11.EKG performed on January 29, 2009, AV pacing and no significant      changes from prior tracing.   BRIEF HISTORY OF PRESENT ILLNESS:  [name] is a 55 year old Caucasian  gentleman with a history of hypertrophic obstructive cardiomyopathy and  mixed systolic and diastolic heart failure, maintained the class I  symptoms usually, previous workup, cardiac cath in 2006 showing  nonobstructive CAD, echo in 2007 showing EF of 40%.  [name] has done  quite well.  He continues to work and reports being in his usual state  of health until Friday evening.  He does not recall any change in his  diet or activity.  He began to have  gradual onset of dyspnea.  He states  it was difficult to take a deep breath and then began having extreme  dyspnea with minimal exertion.  On January 26, 2009, he was unable to walk  greater than 150 feet without having to stop to catch his breath.  Last  night, he was unable to sleep flat on his bed.  He slept in a recliner.  This is unusual for him.  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  The  patient reported his dyspnea.  He was told to come into the office for  eval and ultimately was sent to the ED for questionable pneumonia.  Mr.  [name] denies fevers or chills.  He has had some wheezing and  nonproductive cough.  He does not weigh himself.  He did not notice  edema.   HOSPITAL COURSE:  The patient admitted and underwent procedures as  described above.  He tolerated them well without significant  complications.  The patient was diuresed and symptoms continued to  improve.  Vital signs remained stable.   DISCHARGE VITAL SIGNS:  Most recent vital signs  are on date of discharge  were temperature 97.0 degrees Fahrenheit, BP 90/50, pulse 60,  respirations 18, O2 saturation 93% on room air, weight 120.8 kg down  from 123.6 kg on admission and then vitals signs remained stable with  slight downtrend of BP and so the patient deemed stable for discharge on  January 30, 2009.  He will continue on low-dose Lasix as well as potassium  supplements and his beta-blocker has been slightly increased.  He will  follow up with Dr. Antoine Poche in [place] in less than 2 weeks.  The  patient will receive his new medication list with prescriptions,  postcath instructions, and followup instructions in both oral and  written form.  He should have no questions or concerns upon discharge.   DISCHARGE LABORATORY DATA:  Sodium 140, potassium 3.8, chloride 104, CO2  27, BUN 21, creatinine 1.28, glucose 111, calcium 9.5, hemoglobin A1c  7.1%, and TSH 5.174.   FOLLOWUP PLANS AND APPOINTMENTS:  Dr. Antoine Poche (in [facility]) Feb 05, 2009,  10:30 a.m.   DISCHARGE MEDICATIONS:  1. WelChol 625 mg 3 tabs p.o. daily.  2. Metformin 1 g p.o. b.i.d.  3. Glimepiride 4 mg p.o. b.i.d.  4. Lisinopril 40 mg p.o. daily.  5. Actos 30 mg p.o. daily.  6. Vytorin 10/40 p.o. daily.  7. Aspirin 325 mg p.o. daily.  8. Trilipix 130 mg p.o. daily.  9. Coreg 6.25 mg p.o. b.i.d.  10.Fish oil 1 g p.o. q.i.d.  11.Januvia 100 mg p.o. daily.  12.Lantus subcu injection 15 units daily.  13.Lasix 40 mg p.o. daily.  14.KCl 20 mEq p.o. daily.  15.Vitamin D 1000 International Unit p.o. daily.   Duration of discharge encounter including physician time was 45 minutes.      [name], [facility]      Rollene Rotunda, MD, [facility]  Electronically Signed    MS/MEDQ  D:  01/30/2009  T:  01/31/2009  Job:  [id number]   cc:   Doylene Canning. [name], MD  Ernestina Penna, M.D.

## 2011-02-16 NOTE — Assessment & Plan Note (Signed)
[facility]                            CARDIOLOGY OFFICE NOTE   NAME:[name], [name]                      MRN:          [id number]  DATE:02/05/2009                            DOB:          [birth year]    PRIMARY CARE PHYSICIAN:  Ernestina Penna, MD   REASON FOR PRESENTATION:  Evaluate the patient with recent presentation  for heart failure.   HISTORY OF PRESENT ILLNESS:  The patient presented on January 27, 2009, at  [facility] with shortness of breath.  He had some small pleural  effusions and evidence of volume overload.  He also had some chest  discomfort.  He did have a cardiac catheterization, which demonstrated  an ejection fraction of about 35%-40%.  The left main was free of  disease.  The LAD had a 60% proximal followed by 70% proximal stenosis.  The circumflex had 40-50% ostial stenosis.  Posterolateral had 50%-70%  stenosis.  PDA, he had 40% stenosis.  His right-sided pressures  demonstrated a pulmonary artery pressure of 48 with a wedge of 40 and an  EDP of 35.  His cardiac output was 5.3.  Echocardiogram could not  confirm the ejection fraction as there were poor acoustic windows, but I  thought it was at least mild to moderately reduced with regional wall  motion abnormalities without significant valvular abnormalities.  The  patient was started on a diuretic, which he had not previously been on.  He was started on potassium.  His carvedilol was increased slightly.  He  now returns for followup.  He said he is not having any of the acute  shortness of breath that he was having.  However, he is having  significant fatigue and decreased exercise tolerance, which is new for  him.  He said he is just tired doing his regular activities.  I have  asked him about sleeping and he says he sleeps well.  His wife says he  does not snore.  He did not describe apnea symptoms prior to this  presentation.  She has never noticed that he was apneic.  He  was not  having daytime somnolence or headaches.  He is not having any new  swelling.  He is not describing PND or orthopnea.  He has not been  having palpitation, presyncope, or syncope.   PAST MEDICAL HISTORY:  1. Nonobstructive coronary artery disease, described above.  2. Previous hypertrophic cardiomyopathy with ventricular outflow      gradient (his ejection fraction had fallen to about 40% with      resolution of the gradient).  3. Nonsustained ventricular tachycardia and Holter monitor in the      past.  4. Diabetes mellitus.  5. Obesity.  6. Bradycardia, status post pacemaker placement.  7. Back surgery.   ALLERGIES:  NITROGLYCERIN caused a bad reaction.  The patient is also  told to avoid class IA antiarrhythmics in the past following  electrophysiology study.   MEDICATIONS:  1. Januvia 100 mg daily.  2. Lantus.  3. Coreg 6.25 mg b.i.d.  4. Lasix 40 mg daily.  5.  Potassium 20 mEq daily.  6. Trilipix.   REVIEW OF SYSTEMS:  As stated in the HPI and otherwise negative for  other systems.   PHYSICAL EXAMINATION:  GENERAL:  The patient is in no distress.  VITAL SIGNS:  Blood pressure 90/58, heart rate 74 and regular, weight  268 pounds, and body mass index 34.  HEENT:  Eyelids are unremarkable.  Pupils equal, round, and reactive to  light.  Fundi not visualized.  Oral mucosa unremarkable.  NECK:  No jugular venous distention at 45 degrees.  Carotid upstroke  brisk and symmetric.  No bruits.  No thyromegaly.  LYMPHATICS:  No adenopathy.  LUNGS:  Clear to auscultation bilaterally.  BACK:  No costovertebral angle tenderness.  CHEST:  Unremarkable.  HEART:  PMI not displaced or sustained.  S1 and S2 within normal limits.  No S3, no S4.  No clicks, no rubs, no murmurs.  ABDOMEN:  Obese, positive bowel sounds, normal in frequency and pitch.  No bruits, no rebound, no guarding.  No midline pulsatile mass.  No  hepatomegaly.  No splenomegaly.  SKIN:  No rashes, no  nodules.  EXTREMITIES:  Pulses 2+ throughout.  No edema, no cyanosis, no clubbing.  NEUROLOGIC:  Oriented to person, place, and time.  Cranial nerves II  through XII grossly intact.  Motor grossly intact.   EKG, atrial paced rhythm, 100% capture.   ASSESSMENT AND PLAN:  1. Heart failure.  I think the patient is having more heart failure      symptoms.  His ejection fraction seems to be lower than previous.      It is about 35% by the catheterization.  He is pacing in his RV,      which maybe exacerbating his symptoms.  I am going to send him back      to Dr. [name] to consider LV lead placement.  We do not have any      room for med titration.  2. Nonobstructive coronary artery disease.  I do not think this is      causing any of the symptoms.  He will continue with the meds as      listed.  3. Diabetes per Dr. Christell Constant.  He did not have problems with excessive      sugars in the hospital.  4. Dyslipidemia per Dr. Christell Constant.  Goal will be an LDL less than 100 and      HDL greater than 40.  5. Followup.  I will see him back in a few months in this clinic, but      again we will refer him to Dr. [name] for next available      appointment.     Rollene Rotunda, MD, [facility]  Electronically Signed    JH/MedQ  DD: 02/05/2009  DT: 02/06/2009  Job #: [id number]   cc:   Doylene Canning. [name], MD  Ernestina Penna, M.D.

## 2011-02-16 NOTE — Assessment & Plan Note (Signed)
[facility] [place] [facility]                            CARDIOLOGY OFFICE NOTE   NAME:[name], [name]                      MRN:          [id number]  DATE:05/21/2009                            DOB:          23-May-1956    PRIMARY CARE PHYSICIAN:  Ernestina Penna, MD   REASON FOR PRESENTATION:  Evaluate the patient with cardiomyopathy.   HISTORY OF PRESENT ILLNESS:  The patient presents for followup.  He was  last seen in the hospital in May.  At that time, he was admitted for  upgrade of a dual chamber pacemaker to a BiV ICD.  He was also found at  that time to have hyperkalemia and acute-on-chronic renal insufficiency.  He was admitted; diuretics, potassium, and ACE inhibitor were held.  He  was hydrated.  Since that time he has been watched closely and has had  stable renal insufficiency, but normal electrolytes otherwise.  He has  been maintained off ACE inhibitors and ARBs because of this.   He does report that he feels better with less fatigue since he has had  his CRT.  He has had no chest discomfort, neck, or arm discomfort.  He  has had no palpitation, presyncope, or syncope.  He has had no PND or  orthopnea.  He is able to have more exercise tolerance.   PAST MEDICAL HISTORY:  Nonobstructive coronary artery disease (35-40%  ejection fraction.  The left main, free of disease.  LAD 60% proximal  followed by 70% proximal stenosis.  Circumflex 40-50% ostial stenosis.  Posterolateral 50-70% stenosis.  PDA 40% stenosis), nonsustained  ventricular tachycardia and Holter monitor in the past, diabetes  mellitus, obesity, bradycardia requiring pacemaker placement, back  surgery, and renal insufficiency.   ALLERGIES AND INTOLERANCES:  NITROGLYCERIN causes a bad reaction.  The  patient is also told avoid class IA antiarrhythmic swallowing and EP  study.   MEDICATIONS:  1. Januvia 100 mg daily.  2. Coreg 6.25 mg b.i.d.  3. Lasix 40 mg daily.  4. Trilipix 135 mg  daily.  5. Vitamin D.  6. Lantus.  7. Actos 30 mg daily.  8. Vytorin 10/40 daily.  9. Metformin 1000 mg b.i.d.  10.Glimepiride 4 mg b.i.d.  11.WelChol 625 mg daily.  12.Aspirin 325 mg daily.  13.Fish oil.   REVIEW OF SYSTEMS:  As stated in the HPI, otherwise negative for other  systems.   PHYSICAL EXAMINATION:  GENERAL:  The patient is in no distress.  VITAL SIGNS:  Weight 280 pounds.  Blood pressure 142/70, heart rate 51  and regular.  HEENT:  Eyes are unremarkable.  Pupils are equal, round, reactive to  light, fundi not visualized, oral mucosa unremarkable, edentulous.  NECK:  No jugular venous distention at 45 degrees, carotid upstroke  brisk and symmetric, no bruits, no thyromegaly.  LYMPHATICS:  No cervical, axillary, or inguinal adenopathy.  LUNGS:  Clear to auscultation bilaterally.  BACK:  No costovertebral angle tenderness.  CHEST:  Well-healed ICD pocket.  HEART:  PMI not displaced or sustained, S1 and S2 within normal limits,  no S3, no S4, no  clicks, no rubs, no murmurs.  ABDOMEN:  Obese, positive  bowel sounds normal in frequency and pitch, no bruits,no bruits, no  rebound, no guarding; no midline pulsatile mass; no hepatomegaly; no  splenomegaly.  SKIN:  No rashes.  EXTREMITIES:  2+ pulses throughout, no edema, cyanosis, or clubbing.  NEUROLOGIC:  Grossly intact.   EKG, atrial ventricular paced rhythm.   ASSESSMENT AND PLAN:  1. Cardiomyopathy.  The patient seems to have fewer symptoms with his      upgrade to a BiV ICD.  He is not going to tolerate ACE inhibitors      or ARBs.  He has had a borderline low blood pressure in the past,      so I will not add nitrates or hydralazine.  Rather I will continue      the carvedilol, which I might up titrate in the future.  I might      also add the hydralazine and nitrates in the future.  2. Nonsustained ventricular tachycardia in horizontal implantable      cardioverter-defibrillator.  No further cardiovascular  testing is      suggested.  3. Renal insufficiency.  This is followed closely and is stable.  4. Obesity, understands need to lose weight with diet and exercise.  5. Diabetes mellitus, he is trying to change his diet.  This followed      closely Dr. Christell Constant.  6. Followup.  I will see him back in about 6 months or sooner if      needed.  He will follow in the electrophysiology Clinic.     Rollene Rotunda, MD, [facility]  Electronically Signed    JH/MedQ  DD: 05/21/2009  DT: 05/22/2009  Job #: [id number]   cc:   Ernestina Penna, M.D.

## 2011-02-16 NOTE — Discharge Summary (Signed)
NAMEHELIX, LAFONTAINE NO.:  [id number]   MEDICAL RECORD NO.:  [id number]          PATIENT TYPE:  INP   LOCATION:  4712                         FACILITY:  [facility]   PHYSICIAN:  Doylene Canning. [name], MD    DATE OF BIRTH:  July 28, 1956   DATE OF ADMISSION:  02/19/2009  DATE OF DISCHARGE:  02/22/2009                               DISCHARGE SUMMARY   PRIMARY CARDIOLOGIST:  Rollene Rotunda, MD, [facility]   PRIMARY CARE Makinsey Pepitone:  Ernestina Penna, MD   ELECTROPHYSIOLOGIST:  Doylene Canning. [name], MD   DISCHARGE DIAGNOSIS:  Acute on chronic systolic congestive heart  failure.   SECONDARY DIAGNOSES:  1. Nonischemic cardiomyopathy.  2. Hyperkalemia requiring treatment.  3. Acute on chronic stage II kidney disease.  4. History of complete heart block, status post permanent pacemaker      placement in 2007.  5. History of hypertrophic obstructive cardiomyopathy.  6. Diabetes mellitus.  7. Hypertension.  8. Hyperlipidemia.   ALLERGIES:  NITROGLYCERIN.   PROCEDURES:  Successful device upgrade from a dual-chamber permanent  pacemaker to a Medtronic Concerto II CRT-D biventricular automatic  implantable cardioverter-defibrillator, serial number [id number].   HISTORY OF PRESENT ILLNESS:  A 55 year old male with prior history of  dilated cardiomyopathy and complete heart block, status post permanent  pacemaker placement in 2007.  He was recently seen by Dr. Antoine Poche in  clinic on Feb 05, 2009, with complaints of fatigue and reduced exercise  tolerance.  It was felt that the patient's heart failure maybe pacemaker  mediated and he was subsequently referred to Dr. [name] for  consideration of BiV ICD upgrade.  He saw Dr. [name] on Feb 17, 2009,  and arrangements were made for admission.   HOSPITAL COURSE:  The patient had blood work drawn as an outpatient on  Feb 18, 2009, in preparation for BiV ICD upgrade on Feb 19, 2009.  Unfortunately, the patient was noted to be hyperkalemic with  potassium  of 7.7 and also it was exhibiting acute renal failure with a creatinine  of 2.67.  As such the decision was made to admit the patient and treat  his hyperkalemia prior to biventricular device upgrade.  The patient was  admitted to step-down and his diuretics, potassium, and ACE inhibitor  were held.  He was treated with calcium gluconate, D50, intravenous  insulin, and Kayexalate.  He was also aggressively hydrated.  With these  measures, his creatinine came down to 1.67 by Feb 21, 2009, and his  potassium normalized.  He underwent successful biventricular ICD upgrade  on Feb 21, 2009, with placement of a Medtronic High [place] II CRT-D.  He  tolerated this procedure well and postprocedure has been ambulating  without difficulty.  Postprocedure chest x-ray shows no evidence of  pneumothorax.  The patient's Lasix has been resumed; however, we will  continue to hold his lisinopril and potassium.  He will be discharged  home today in good condition.  We will arrange for followup BMET next  week.   DISCHARGE LABORATORIES:  Hemoglobin 12.7, hematocrit 37.2, WBC 5.2,  platelets 122.  Sodium 141, potassium  4.4, chloride 108, CO2 of 25, BUN  35, creatinine 1.67, glucose 121, calcium 9.0.  BNP 216.   DISPOSITION:  The patient will be discharged home today in good  condition.   FOLLOWUP PLANS AND APPOINTMENTS:  We will arrange for wound care  followup approximately 10 days and then follow up with Dr. [name] in  approximately 3 months.  He will continue to follow up with Dr. Antoine Poche  and Dr. Christell Constant as previously scheduled.   DISCHARGE MEDICATIONS:  1. Keflex 500 mg b.i.d. x5 days.  2. Januvia 100 mg daily.  3. Lantus 15 units daily.  4. Coreg 6.25 mg b.i.d.  5. Lasix 40 mg daily.  6. Trilipix 135 mg daily.  7. Metformin 1000 mg b.i.d.  8. Vytorin 10/40 mg nightly.  9. Aspirin 325 mg daily.  10.Actos 30 mg daily.  11.Amaryl 4 mg b.i.d.  12.Fish oil 400 mg daily.  13.WelChol 625  mg t.i.d.  14.Vitamin D 1000 units daily.   OUTSTANDING LABORATORY STUDIES:  Follow up BMET next week.   DURATION OF DISCHARGE ENCOUNTER:  60 minutes including physician time.       Nicolasa Ducking, ANP      Doylene Canning. [name], MD  Electronically Signed    CB/MEDQ  D:  02/22/2009  T:  02/23/2009  Job:  [id number]   cc:   Ernestina Penna, M.D.

## 2011-02-16 NOTE — Assessment & Plan Note (Signed)
[place] [facility]                         ELECTROPHYSIOLOGY OFFICE NOTE   NAME:[name], [name]                      MRN:          [id number]  DATE:01/23/2008                            DOB:          [birth year]    Mr. Mcewen returns today for follow-up.  He is a very pleasant middle-  aged male with complete heart block and hypertension and diabetes.  He  denies chest pain or shortness of breath.  He does admit that his  diabetes was not particularly well-controlled but has been better  recently with combination therapy.  He had no other specific complaints.   MEDICATIONS:  Include:  1. Zetia 100 mg daily.  2. Lantus 10 units daily.  3. Carvedilol 3.125 daily.  4. Fenofibrate 160 a day.  5. Lisinopril 40 a day.  6. Fish oil supplement.  7. Metformin 500 mg 2 tablets twice daily.  8. Glyburide 4 mg twice daily.  9. Vytorin 10/40.  10.Aspirin 325 a day.  11.Actos 30 a day.   On exam, he is a pleasant middle-aged man in no distress.  Blood  pressure was 117/74 with a pulse 61 and regular, respirations 18.  The  weight was 275 pounds.  NECK:  Revealed no jugular venous distention.  LUNGS:  Were clear bilaterally to auscultation.  No wheezes, rales or  rhonchi.  CARDIOVASCULAR EXAM:  Revealed a regular rate and rhythm.  Normal S1-S2.  EXTREMITIES:  Demonstrated 1+ peripheral edema bilaterally.   Interrogation of his pacemaker demonstrates Medtronic Adapta.  P-waves  were 2.  The R waves were not available secondary to complete heart  block.  The impedance 463 in the atrium, 432 in the ventricle, threshold  0.375 at 0.4 in the A, 0.65 at 0.4 in the V.  Battery voltage was 2.78  volts.  Estimated longevity was 8 years.  He was 98% V paced.   IMPRESSION:  1. Complete heart block.  2. Hypertension.  3. Status post pacemaker insertion.   DISCUSSION:  Overall, Mr. Linhardt is stable.  His pacemaker interval is  working normally.  We will see him back in  the office in one year for  pacemaker follow-up.     Doylene Canning. [name], MD  Electronically Signed    GWT/MedQ  DD: 01/23/2008  DT: 01/23/2008  Job #: [id number]

## 2011-02-16 NOTE — Assessment & Plan Note (Signed)
[facility]                         ELECTROPHYSIOLOGY OFFICE NOTE   NAME:[name], [name]                      MRN:          [id number]  DATE:07/05/2007                            DOB:          1956/08/30    [name] returns today for followup.  He is a very pleasant middle-  aged man with a history of complete heart block and nonischemic  cardiomyopathy with variable LV function as low as 40%, as high as 55%,  who returns today for followup.  He has had no specific complaints.  He  denies peripheral edema, denies chest pain, denies shortness of breath.  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  The patient has been stable and had no  specific complaints today.  He denies syncope or chest pain.   MEDICATIONS:  1. Actos 30 daily.  2. Enteric coated aspirin.  3. Vytorin 10/40.  4. Welchol.  5. Metformin 500 mg two tablets b.i.d.  6. Fish oil supplement.  7. Lisinopril 40 daily.  8. Carvedilol 12.5 b.i.d.  9. Fenofibrate.   PHYSICAL EXAMINATION:  GENERAL:  He is a pleasant middle-aged man in no  acute distress.  VITAL SIGNS:  Blood pressure was 106/72, the pulse was 60 and regular,  respirations were 16.  The weight was 281 pounds.  NECK:  Revealed no jugular venous distension, there is no thyromegaly,  trachea was midline.  LUNGS:  Clear bilaterally without wheezes, rales or rhonchi. There is no  increased work of breathing.  CARDIOVASCULAR EXAM:      Regular rate and rhythm with normal S1 and S2,  ST was split.  PMI was slightly enlarged.  ABDOMEN:  Soft, nontender, nondistended, there is no organomegaly.  EXTREMITIES:  Demonstrated trace peripheral edema bilaterally.   EKG today demonstrates sinus rhythm with AV sequential pacing, the QRS  restoration pace was over 230 milliseconds.   Interrogation of his pacemaker demonstrates a Medtronic Adapta with P  waves of 4, there were no R  waves secondary to complete heart block.  Impedance's were 490 in the atrium, 465 in the ventricle, the threshold  0.25 and 0.4 in the atrium and 0.25 and 0.4 in the ventricle. Battery  voltage was 2.78 volts.   IMPRESSION:  1. Nonischemic cardiomyopathy with variable LV function, most recently      55%.  2. A complete heart block status post permanent pacemaker insertion.  3. Hypertension.  4. Congestive heart failure presently class I to class II.   DISCUSSION:  Overall [name] is stable and his pacemaker is working  normally.  He does not have any underlying escape rhythm.  His blood  pressure is also well controlled.  Will plan to see him back in the  office with me in about 6 months.     [name]. Dakota Ridgel, MD  Electronically Signed    GWT/MedQ  DD: 07/05/2007  DT: 07/05/2007  Job #: [id number]

## 2011-02-16 NOTE — H&P (Signed)
NAMECEBERT, DETTMANN NO.:  [id number]   MEDICAL RECORD NO.:  [id number]          PATIENT TYPE:  INP   LOCATION:  4707                         FACILITY:  [facility]   PHYSICIAN:  Jesse Sans. Wall, MD, FACCDATE OF BIRTH:  [birth year]   DATE OF ADMISSION:  01/27/2009  DATE OF DISCHARGE:                              HISTORY & PHYSICAL   PRIMARY CARDIOLOGIST:  Rollene Rotunda, MD, [facility]   ELECTROPHYSIOLOGIST:  Doylene Canning. [name], MD   PRIMARY CARE PHYSICIAN:  Ernestina Penna, MD   Mr. 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  Mr. Ion has done quite well.  He  continues to work.  He states he was in his usual state of health until  Friday evening.  He does not recall any change in his diet or activity.  He began to have a gradual onset of dyspnea.  He states it was difficult  to take a deep breath and then began having extreme dyspnea with minimal  exertion.  He states by yesterday he was unable to walk greater than 150  feet without having to stop and catch his breath.  Last night, he was  unable to sleep flat in his bed.  He slept in the recliner.  This is  unusual for him.  He states this morning got up and went to work.  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  The patient  reported his dyspnea.  He was told to come to the office for evaluation  and then ultimately was sent here for further evaluation of questionable  pneumonia.  Mr. Ander denies any fever or chills.  He has had some  wheezing and nonproductive cough.  He does not weigh himself.  He has  not noticed any edema.   PAST MEDICAL HISTORY:  1. Hypertrophic obstructive cardiomyopathy with dilated      cardiomyopathy, felt to be mixed  systolic diastolic heart failure,      previously class I symptoms, EF 40-45% by cardiac catheterization      and 2-D echocardiogram several years ago.  2. History of syncope secondary to complete heart block, status post      dual-chamber permanent pacemaker.  The patient has a Medtronic      Adapta implant in 2007.  3. Hypertension.  4. Nonobstructive CAD.  5. Dyslipidemia.  6. Diabetes requiring multiple medications.   SOCIAL HISTORY:  The patient lives in [place] with his wife.  He works  for a [employer], also does some Administrator, sports.  He has a son with  HOCM.  Denies any tobacco, drug, or herbal medication use.  He has an  occasional beer.  He tries to follow ADA diet.   FAMILY HISTORY:  Mother's health unknown.  Father had a history of  diabetes.  Siblings alive and well.   REVIEW OF SYSTEMS:  Positive  for shortness of breath; dyspnea on  exertion; orthopnea; cough, nonproductive; and wheezing.  All other  systems reviewed and negative.   ALLERGIES:  No known drug allergies.   MEDICATIONS:  1. Lantus 15 units subcu nightly.  2. Coreg 6.25 mg b.i.d.  3. Trilipix 135 daily.  4. Lisinopril 40.  5. Fish oil 4 g daily.  6. Metformin 1000 mg b.i.d.  7. Vytorin 10/40 mg daily.  8. Aspirin 325.  9. Actos 30 daily.  10.Januvia 100 daily.  11.Amaryl 4 mg b.i.d.  12.WelChol 625 mg t.i.d.  13.Vitamin D daily   PHYSICAL EXAMINATION:  VITAL SIGNS:  Temperature 98.9, heart rate 65,  respirations 18, and blood pressure 128/77.  The patient's sat 94% on  room air.  GENERAL:  He is extremely dyspneic with minimal exertion, otherwise in  no acute distress.  HEENT:  Unremarkable.  NECK:  Supple without lymphadenopathy or bruits.  JVD around 8 cm of 45  degrees' angle.  LUNGS:  Clear to auscultation.  Distant breath sounds in bilateral  bases.  CARDIOVASCULAR:  S1 and S2.  SKIN:  Warm and dry.  ABDOMEN:  Soft, nontender, positive bowel sounds, and obese.  LOWER EXTREMITIES:   Without clubbing or cyanosis.  He has +1 nonpitting  edema bilaterally.  NEUROLOGIC:  Alert and oriented x3.  Normal affect.   Chest x-ray obtained at Dr. Kathi Der office showed some signs of  congestive heart failure.  EKG, AV paced at a rate of 60.  Lab work is  pending.   IMPRESSION:  Dyspnea, suspect primary culprit is congestive heart  failure.  We will give Lasix and diuresis.  Reevaluate chest x-ray in  the a.m.  Lab work is pending.  The patient without any signs or  symptoms suggestive of angina.  He has been admitted for acute on  chronic diastolic/systolic combined CHF.  We will repeat 2-D  echocardiogram this admission.  Also, Dr. Valera Castle has been in to  examine and assess.  The patient agrees with plan of care.      Dorian Pod, ACNP      Jesse Sans. Daleen Squibb, MD, [facility]  Electronically Signed    MB/MEDQ  D:  01/27/2009  T:  01/28/2009  Job:  [id number]

## 2011-02-19 NOTE — Consult Note (Signed)
NAMELONELL, STAMOS NO.:  [id number]   MEDICAL RECORD NO.:  [id number]          PATIENT TYPE:  OBV   LOCATION:  1827                         FACILITY:  [facility]   PHYSICIAN:  Karol T. Lazarus Salines, M.D. DATE OF BIRTH:  18-Nov-1955   DATE OF CONSULTATION:  04/28/2005  DATE OF DISCHARGE:                                   CONSULTATION   CHIEF COMPLAINT:  Facial trauma.   HISTORY:  A 55 year old white male was struck in the lower face when a  lawnmower fell off a lift at his workplace approximately four hours ago.  He  sustained a laceration of the left lower lip and banged the back of his  head, but was not knocked unconscious.  In the emergency room evaluation  revealed also an alveolar fracture and a fractured posterior left mandibular  tooth.  No frank mandible fracture.  He had some bleeding at the site of the  accident, but nothing recent.  His most recent tetanus booster was two years  ago.  He is diabetic under good control.   PAST MEDICAL HISTORY:  No known medical allergies.  He takes aspirin and can  not recall the remainder of his medications.  He does have diabetes.  History of heart attack approximately 20 years ago.  He has had several  recent coronary catheterizations and echocardiograms.  At one point he  sustained a diagnosis of hypertrophic cardiomyopathy but the most recent  echocardiogram suggested that this perhaps was less active at present.  No  history of hepatitis or HIV.  No history of asthma, stroke, or DVT.  No  history of bleeding tendencies.  He had back surgery some years ago without  complication.   SOCIAL HISTORY:  He is married.  He is working.   FAMILY HISTORY:  Negative for bleeding or anesthesia reactions.   REVIEW OF SYSTEMS:  He does not smoke.   PHYSICAL EXAMINATION:  GENERAL:  This is a pleasant, obese, middle-aged  white male in no obvious distress.  HEENT:  He does have a visible laceration approximately 3 cm long in the  left lower lip full thickness.  He has a fractured segment of the left  mandibular alveolus containing four decayed teeth.  He has a mobile  fractured posterior mandibular tooth, probably #17.  Ears are clear. Nose  not examined.  NECK:  Unremarkable.  LUNGS:  Distant, but clear to auscultation.  HEART:  Regular rate and rhythm without murmurs.  ABDOMEN:  Soft and active.  EXTREMITIES:  Normal configuration.   IMPRESSION:  1.  Left lower lip laceration and alveolar fracture.  2.  Dental fracture.   PLAN:  I am going to repair the lip laceration.  I will try circumandibular  wiring to stabilize the alveolus.  I will extract tooth #17, although the  patient is aware that I may not be successful at extracting the one  remaining sclerotic root.       KTW/MEDQ  D:  04/28/2005  T:  04/28/2005  Job:  [id number]   cc:   Rollene Rotunda, M.D.  [address]  [address]  [place]  [place] [address]

## 2011-02-19 NOTE — Assessment & Plan Note (Signed)
[facility]                         ELECTROPHYSIOLOGY OFFICE NOTE   NAME:TILLEYTreyton, Slimp                      MRN:          [id number]  DATE:10/31/2006                            DOB:          Oct 29, 1955    Mr. Saini returns today for followup of his congestive heart failure,  nonischemic cardiomyopathy, and complete heart block.   HISTORY OF PRESENT ILLNESS:  The patient is a very pleasant middle-aged  man with a history of all of the above who underwent pacemaker  implantation back in November. The patient at that time had presented  with syncope. He had dual chamber pacemaker placed without difficulty.  He has now returned for additional evaluation. He denies chest pain or  shortness of breath, and he has had no recurrent syncope. He denies  peripheral edema or other symptomatic heart failure symptoms. He has  questions about welding.   PHYSICAL EXAMINATION:  GENERAL:  He is a pleasant middle-aged man in no  acute distress.  VITAL SIGNS:  Blood pressure 116/80, pulse 70 and regular, respirations  18, weight 288 pounds.  NECK:  Revealed no jugular venous distention.  LUNGS:  Clear bilaterally to auscultation. No wheezes, rales or rhonchi.  CARDIOVASCULAR:  Regular rate and rhythm with normal S1 and S2.  EXTREMITIES:  Demonstrated no cyanosis, clubbing or edema.  LUNGS:  Clear bilaterally to auscultation.   EKG demonstrates sinus rhythm with AV sequential pacing. The QRS  duration was 232 msec. Interrogation of his pacemaker demonstrates a  Medtronic adapter with P&R waves of 2 and no R waves secondary to  underlying pacemaker dependence. Impedance 555 in the atrium; 588 in the  ventricle. The threshold is 0.5 and 0.4 in both the atrium and the  ventricle. Today outputs were decreased to 2 and 2.5 in the atrium and  ventricle, respectively. The battery voltage is 2.78 volts. There was  one nonsustained episode of VT (11 beats).    IMPRESSION:  1. Not dilated cardiomyopathy. EF 40% in the past.  2. Complete heart block status post pacemaker insertion.   DISCUSSION OVERALL:  Mr. Hochmuth is stable, and his pacemaker is working  normally. Will plan to see him back in pacemaker clinic in nine months.     Doylene Canning. [name], MD  Electronically Signed   GWT/MedQ  DD: 10/31/2006  DT: 10/31/2006  Job #: [id number]   cc:   Ernestina Penna, M.D.

## 2011-02-19 NOTE — H&P (Signed)
NAMEMICKIE, KOZIKOWSKI NO.:  [id number]   MEDICAL RECORD NO.:  [id number]          PATIENT TYPE:  INP   LOCATION:  2905                         FACILITY:  [facility]   PHYSICIAN:  Rollene Rotunda, MD, FACCDATE OF BIRTH:  August 28, 1956   DATE OF ADMISSION:  08/19/2006  DATE OF DISCHARGE:                                HISTORY & PHYSICAL   PRIMARY CARE PHYSICIAN:  Dr. Vernon Prey.   REASON FOR PRESENTATION BY PATIENT:  Syncope.   HISTORY OF PRESENT ILLNESS:  The patient is a pleasant 55 year old gentleman  with a complicated past cardiac history. He had a history of cardiomyopathy  and for years was followed for a significant left ventricular outflow  gradient. He had been managed with verapamil and Toprol. However, over time  he developed a dilated cardiomyopathy with ejection fraction approximately  40%. Underwent cardiac catheterization with results as described below.  There was no gradient at that time this catheterization was performed in  2006. He has also had progressive bradyarrhythmia with sinus bradycardia,  right bundle branch block, and left anterior fascicular block. He had been  taken off of verapamil and beta blockers. He had never had any presyncope or  syncope or lightheadedness. He never had any chest discomfort, neck or arm  discomfort.   He had noticed for about a week some increased dyspnea with exertion such as  climbing up a small hill. He had not had any chest pressure. He denies any  neck discomfort, arm discomfort, activity-induced nausea, vomiting,  excessive diaphoresis. He has not had any resting shortness of breath.  Denies any PND or orthopnea.   Today at work he was sitting, got up, and passed out. He does not recall any  of these events.  The only thing he recalls is being in the ambulance. He  had a complete heart block with intermittent ventricular escape. He had his  old monitor placed and was treated with atropine and transported. On  arrival  to the emergency room he was responsive and back in sinus rhythm though this  clearly documented complete heart block.   PAST MEDICAL HISTORY:  Hypertrophic cardiomyopathy with evolution to dilated  cardiomyopathy without significant outflow gradient (last ejection fraction  40-45% by catheterization), nonobstructive coronary artery disease 2006  (normal left main, LAD 40% proximal stenosis, 60% mid stenosis, 37% distal  stenosis, circumflex at 40% stenosis in obtuse marginal 1, 60% stenosis in  lower branch of obtuse marginal 2, right coronary artery had minor  irregularities). 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   PAST SURGICAL HISTORY:  Back surgery.   ALLERGIES:  An intolerance to TYPE 1 ANTIARRHYTHMICS, NITROGLYCERIN.   MEDICATIONS:  1. Actos 30 mg.  2. Aspirin 325 mg daily.  3. Vytorin 10/40 daily.  4. Welchol 625 mg t.i.d.  5. Antara 130 mg daily.  6. Glimepiride 4 mg b.i.d.  7. Metformin 1000 mg b.i.d.  8. Fish oil.  9. Lisinopril 40 mg daily.  SOCIAL HISTORY:  Patient is married. He has children. He works every day. He  is not smoking cigarettes.   FAMILY HISTORY:  Noncontributory for early coronary artery disease or sudden  cardiac death. Interestingly, it sounds like his son is headed to [facility] soon  to have surgical treatment for probable hypertrophic cardiomyopathy.   REVIEW OF SYSTEMS:  As stated in the HPI, and, otherwise, negative for all  other systems.   PHYSICAL EXAMINATION:  GENERAL:  The patient is in no distress.  VITAL SIGNS:  Blood pressure 127/70, heart rate 50 and regular.  HEENT:  Afebrile. Eyes are unremarkable. Pupils equal, round, reactive to  light. Fundi not visualized, oral mucosa unremarkable, edentulous.  NECK:  No jugular distension at 45 degrees, wave form within  normal limits,  carotid upstroke brisk and symmetrical. No bruits, no thyromegaly.  LYMPHATICS:  No cervical, axillary or inguinal adenopathy.  LUNGS:  Clear to auscultation bilaterally.  BACK:  No costovertebral angle tenderness.  CHEST:  Unremarkable.  HEART:  PMI not displaced or sustained, S1, S2 within normal limits. No S3,  no S4. No clicks, no rubs, no murmurs.  ABDOMEN:  Obese.  SKIN:  Slightly mottled.  ABDOMEN:  Positive bowel sounds normal in frequency and pitch. No bruits, no  rebound, no guarding, no midline pulsatile mass. __________ no splenomegaly.  SKIN:  No rashes, no nidus.  EXTREMITIES:  2+ pulses throughout. No edema, no cyanosis, no clubbing.  NEUROLOGICAL:  Oriented to person, place, time. Cranial nerves II-XII  grossly intact. Motor grossly intact throughout.   EKG:  Sinus rhythm, rate 60, right bundle branch block, left anterior  fascicular block.   LABORATORY DATA:  Labs pending.   Chest x-ray pending.   ASSESSMENT/PLAN:  1. Complete heart block. The patient has complete heart block. This most      likely related to conduction system rather than ischemia. He is going      to need a permanent pacemaker, and I have discussed this with Dr.      [name] who is poised to do this today.  2. Cardiomyopathy. I will reassess his ejection fraction, but he has had      class I symptoms. It is a  nonischemic cardiomyopathy by all accounts.      His ejection fraction was 40%. At this point I do not think he needs a      defibrillator though it could progress to this in the future. Will      determine this based on today's echocardiogram prior to his procedure.  3. Coronary disease. He has had nonobstructive coronary disease. Probably      will evaluate him with a noninvasive      study after he has had his pacemaker placed.  4. Obesity. We have had long discussions about the need to lose weight     with diet and exercise.  5. Diabetes. Will continue his outpatient  regimen.      Rollene Rotunda, MD, [facility]  Electronically Signed     JH/MEDQ  D:  08/19/2006  T:  08/19/2006  Job:  [id number]   cc:   Ernestina Penna, M.D.

## 2011-02-19 NOTE — Op Note (Signed)
[name], [name] NO.:  [id number]   MEDICAL RECORD NO.:  [id number]          PATIENT TYPE:  INP   LOCATION:  2905                         FACILITY:  [facility]   PHYSICIAN:  Doylene Canning. [name], MD    DATE OF BIRTH:  Oct 26, 1955   DATE OF PROCEDURE:  08/19/2006  DATE OF DISCHARGE:                                 OPERATIVE REPORT   PROCEDURE PERFORMED:  Implantation of dual-chamber pacemaker.   INDICATIONS:  Symptomatic complete heart block with syncope.   I. INTRODUCTION:  The patient is a 55 year old male with hypertrophic  cardiomyopathy, but no gradient, who has a history of left bundle branch  block in the past.  His EF is 40% by echo.  He experiences syncopal episode  where he suddenly passed out, and is now referred for dual-chamber pacemaker  implantation.   II. PROCEDURE:  After informed consent was obtained the patient was taken to  the diagnostic __________ lab in the fasting state.  After the usual  preparation and draping, intravenous fentanyl and metaxalone was given for  sedation.  Then 30 mL of lidocaine was infiltrated into the left  infraclavicular region.  A 5-cm incision was carried out over this region  with electrocautery and utilized to dissect down to the fascial plane.  The  left subclavian vein was punctured and 10 mL of contrast was injected into  it, demonstrating it to be patent.  It was subsequently punctured x2; and  the Medtronic model 5076, 58 cm, active fixation pacing lead, serial number  [id number] was advanced to the right ventricle; and the Medtronic model  5076, 52-cm, active fixation pacing lead, serial number [id number] was  advanced to the right atrium.  Mapping was carried out on the right  ventricle; and the final site on the RV septum.  Near the outflow tract the  R-waves were 17, the pacing impedance was 703 ohms and the threshold 0.9  volts at 0.5 milliseconds; 10 volt pacing did not stimulate the diaphragm.   With  the ventricular lead in satisfactory position, attention was then  turned to placement of the atrial lead which was placed in the right atrial  appendage where P-waves measured 6 mV and the pacing impedance 722 ohms.  The pacing threshold in the atrium was a volt at 0.5 milliseconds; 10 volts  pacing did not stimulate the diaphragm.  With both atrial and ventricular  leads in satisfactory position, they were secured to the subpectoralis  fascia with a figure-of-eight silk suture.  The sewing sleeve was also  secured with a silk suture.  Electrocautery was then utilized to make a  subcutaneous pocket.  Kanamycin irrigation was utilized to irrigate the  pocket; and electrocautery utilized to assure hemostasis.  The Medtronic  Adaptar ADDRL-1, dual-chamber pacemaker, serial number [id number] was  connected to the atrial and ventricular pacing leads and placed in the  subcutaneous pocket.  Generator was secured with a silk suture.   Additional kanamycin was then utilized to irrigate the pocket; and the  incision closed with a layer of 2-0 Vicryl followed by a layer  of 3-0  Vicryl, followed by layer of 4-0 Vicryl.  Benzoin was painted on the skin,  Steri-Strips, were applied and a pressure dressing was placed; and the  patient was returned to his room in satisfactory condition.   III. COMPLICATIONS:  There were no immediate procedure complications.   IV. RESULTS:  This demonstrates successful implantation of a Medtronic dual-  chamber pacemaker in a patient with symptomatic complete heart block and  syncope.      Doylene Canning. [name], MD  Electronically Signed     GWT/MEDQ  D:  08/19/2006  T:  08/20/2006  Job:  [id number]   cc:   Rollene Rotunda, MD, [facility]  Ernestina Penna, M.D.

## 2011-02-19 NOTE — Op Note (Signed)
NAMEABBY, STINES NO.:  [id number]   MEDICAL RECORD NO.:  [id number]          PATIENT TYPE:  OBV   LOCATION:  5511                         FACILITY:  [facility]   PHYSICIAN:  Zola Button T. Lazarus Salines, M.D. DATE OF BIRTH:  Jan 20, 1956   DATE OF PROCEDURE:  04/28/2005  DATE OF DISCHARGE:  04/28/2005                                 OPERATIVE REPORT   PREOPERATIVE DIAGNOSES:  1.  Left lower lip laceration.  2.  Alveolar fracture.  3.  Dental fracture, tooth #17.   POSTOPERATIVE DIAGNOSES:  1.  Left lower lip laceration.  2.  Alveolar fracture.  3.  Dental fracture, tooth #17.   PROCEDURE PERFORMED:  1.  Complex repair, full-thickness, left lower lip laceration.  2.  Circummandibular wiring for repair of alveolar fracture.  3.  Extraction,  tooth #17, tooth #24 (? 25).   SURGEON:  Gloris [place]. Lazarus Salines, M.D.   ANESTHESIA:  General orotracheal.   BLOOD LOSS:  Minimal.   COMPLICATIONS:  None.   FINDINGS:  Four remaining anterior mandibular teeth with severe periodontal  disease.  Tooth #24? 25 extremely loose and basically extruded from the  fracture site. The remaining teeth, relatively fixed in the alveolar  fragment which was readily reduced and stable. Tooth #17 with two wide  roots, one of which was fractured off and one of which was avulsed with the  tooth which was severely loosened and readily removed. A 3 - 4 cm angled  laceration from the oral commissure, full-thickness through the internal  mucosa and to the orbicularis oris muscle.   PROCEDURE:  With the patient in the comfortable supine position, general  orotracheal anesthesia was induced without difficulty. At an appropriate  level,  the patient was placed in a slight sitting position. A saline  moistened throat pack was placed. The oral cavity was prepared with Betadine  solution. The mucosa surrounding tooth #17 was infiltrated with 1% Xylocaine  with 1:100,000 epinephrine. Likewise in front of and  behind the alveolar  fractured fragment in the anterior mandible for intraoperative hemostasis.  Several minutes allowed to these to take effect.   A Freer elevator was used to free the gingiva from the fractured tooth #17.  It was readily removed. The one tooth root which was removed with the  fractured tooth was intact. A remaining tooth root was firmly fixed in the  alveolus. Using various angled periodontal elevators, the bone surrounding  this root was gradually chipped away and the root was delivered intact. The  socket was carefully curetted and no residual material remained. The bony  edges of the alveolus were partially reduced with a rongeur and then with a  rasp. A smooth contour was noted. The alveolar mucosa was reapproximated  with 3-0 chromic sutures. This completed the extraction of the tooth.   The alveolar fragment was identified and was mobile. A small puncture  incision was made in the submental skin. Using a wire passing needle first  posterior to the mandible and then anterior to the mandible, two  circummandibular wires, 24 gauge, were passed around the mandible  and  threaded between the remaining teeth. Tooth #24 (? 25) was loose and  practically falling out and for fear of aspiration was removed with a  hemostat. No alveoloplasty was required. The circummandibular wires were  tightened down snugly and a good stable correction of the alveolar fracture  was accomplished. The wire ends were cut off and twisted and buried. This  completed the repair of the alveolar fracture.   Piecing together the complex laceration of the oral commissure, the internal  mucosa was closed with buried sutures of 4-0 chromic gut. The vermilion  cutaneous border was closed with the same material. After reapproximating  the internal lip, the orbicularis oris muscle was reapproximated with 4-0  Vicryl stitches. Finally the linear laceration of the external skin was  closed with a running  simple 5-0 Ethilon. Additional 4-0 chromic sutures  were used to complete the approximation of the vermilion. This completed the  repair of the external laceration.   The throat pack was removed after suctioning the pharynx clear. Hemostasis  was observed. The submental puncture was deliberately not closed to prevent  infection. The wound was dressed with ointment. The patient was returned to  Anesthesia, awakened, extubated, and transferred to recovery in stable  condition.   COMMENT:  A 55 year old white male banged in the lower face when a lawn  mower fell off a lift early today. He sustained a full-thickness laceration  of the lower lip and alveolar fracture, and a fracture of tooth #17, hence  the indications for the several components of today's procedure. Anticipate  routine postoperative recovery with attention to ice, elevation, analgesia.  Given low anticipated risk of postanesthetic or postsurgical complications,  feel outpatient venue is appropriate.       KTW/MEDQ  D:  04/28/2005  T:  04/29/2005  Job:  [id number]   cc:   Rollene Rotunda, M.D.  [address]  [address]  [place]  [place] [address]

## 2011-02-19 NOTE — Discharge Summary (Signed)
NAMEHAYDON, DORRIS NO.:  [id number]   MEDICAL RECORD NO.:  [id number]          PATIENT TYPE:  INP   LOCATION:  2905                         FACILITY:  [facility]   PHYSICIAN:  Jonelle Sidle, MD DATE OF BIRTH:  Jan 10, 1956   DATE OF ADMISSION:  08/19/2006  DATE OF DISCHARGE:  08/20/2006                                 DISCHARGE SUMMARY   PRIMARY CARDIOLOGIST:  Dr. Rollene Rotunda.   PRIMARY CARE PHYSICIAN:  Dr. [name].   REASON FOR ADMISSION:  Syncope in the setting of complete heart block.   DISCHARGE DIAGNOSES:  1. Syncope in the setting of complete heart block.      a.     Status post permanent pacemaker implantation this admission.  2. Minimally elevated cardiac enzymes.  3. Nonobstructive coronary disease by catheterization 2006.      a.     Left anterior descending artery 40% proximal, 60% mid, 30%       distal.  Left circumflex with 40% proximal lesion in the first obtuse       marginal and 60% lesions in the lower branch of the second obtuse       marginal.  Right coronary artery minor luminal irregularity in the       midsection.  4. 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      a.     Ejection fraction 40-45% by catheterization 2006.      b.     Echocardiogram this admission.  Difficult apical windows;       ejection fraction moderately depressed at 40%, akinesis of the       inferior wall, hypokinesia of the inferoseptal wall, posterior wall       very difficult study.  Left ventricular wall thickness markedly       increased.  5. Diabetes mellitus.  6. Treated dyslipidemia.   HISTORY:  Mr. Ballentine is a 55 year old male patient who presented to [facility] with syncope.  He had complete heart block documented on the  monitor.  He was given atropine and converted back to sinus rhythm.  He is  admitted for further evaluation and treatment.   HOSPITAL COURSE:  The patient was admitted by Dr. Antoine Poche.  He was also  seen in consultation by Dr. [name] who took him for permanent pacemaker  implantation.  This was a [employer] device and was implanted on August 19, 2006.  The patient tolerated the procedure well without immediate  complications.  He was seen by Dr. Diona Browner on the morning of August 20, 2006.  He was placed on low-dose Coreg.  His device was checked and was  functioning appropriately.  His postop chest x-ray was normal.  Dr. Diona Browner  saw him later in the day and felt he was ready for discharge to home.  He  noted Dr. Jenene Slicker mention of a follow-up Myoview in light of the minimal  troponin elevations.   LABORATORY DATA:  White count 6600, hemoglobin 16, hematocrit 47,  platelet  count 219,000.  INR 1.1.  Sodium 133, potassium 4.6, glucose 98, BUN 26,  creatinine 1.3.  Hemoglobin A1c 7.8.  CK #1, 149; CK-MB #1, 4.6; troponin  #1, 1.38;  #2 CK 127, MB 4.8, troponin I 4.44; #3 CK 165, MB 4.8, troponin I  of 0.54.   Total cholesterol 129, triglycerides 130, HDL 41, LDL 62.  Echocardiogram as  noted above.  Chest x-ray post pacemaker implant, placement left cardiac  pacer without complicating features.  Prepacer cardiomegaly without acute  disease.   DISCHARGE MEDICATIONS:  1. Coreg 3.125 mg b.i.d.  2. Welchol 6.25 mg two times a day.  3. Lisinopril 40 mg daily.  4. Actos 30 mg daily.  5. Vytorin 10/40 mg daily.  6. Fenofibrate 160 mg daily.  7. Aspirin 325 mg daily.  8. Glimepiride 4 mg b.i.d.  9. Metformin 500 mg b.i.d. to be restarted on November19,2007.   DIET:  Low-fat, low-sodium diabetic diet.   ACTIVITY AND WOUND CARE:  The patient has been provided with specific  instructions regarding this.   FOLLOW UP:  1. The patient will be set up for an outpatient adenosine Myoview and he      will be contacted with an appointment.  2. He will be set up for an appointment with the [facility] in the  next      two weeks and the office will contact him.  3. He will be set up for an appointment with Dr. Antoine Poche in the next one      or two weeks and our office will contact him.  4. He will be set up for follow up with Dr. [name] in the coming months      and the office will contact him with an appointment.  Total physician      PA time greater than 30 minutes on this discharge.      Tereso Newcomer, PA-C      Jonelle Sidle, MD  Electronically Signed    SW/MEDQ  D:  08/20/2006  T:  08/20/2006  Job:  [id number]   cc:   Ernestina Penna, M.D.

## 2011-02-19 NOTE — Cardiovascular Report (Signed)
NAMENOVAK, STGERMAINE NO.:  [id number]   MEDICAL RECORD NO.:  [id number]          PATIENT TYPE:  OIB   LOCATION:  6501                         FACILITY:  [facility]   PHYSICIAN:  Arvilla Meres, M.D. LHCDATE OF BIRTH:  1956/06/05   DATE OF PROCEDURE:  12/11/2004  DATE OF DISCHARGE:  12/11/2004                              CARDIAC CATHETERIZATION   PRIMARY CARE PHYSICIAN:  Dr. Vernon Prey.   CARDIOLOGIST:  Rollene Rotunda, M.D.   PATIENT IDENTIFICATION:  Mr. Neels is a very pleasant 55 year old  male  with a history of hypertrophic obstructive cardiomyopathy who was referred  for cardiac catheterization after recent echocardiogram showed a drop in his  ejection fraction and a new apical wall motion abnormality.   PROCEDURE PERFORMED:  1.  Selective coronary angiography.  2.  Left heart catheterization.  3.  Left ventriculogram.   ACCESS SITE AND CATHETERS:  4 French right femoral artery with 4 [place] JL-  4, JR-4, and bent pigtail.   DESCRIPTION OF PROCEDURE:  The risks and benefits of the catheterization  were explained to Mr. Donnie Aho.  Consent was signed and placed on the chart.  The right groin area was prepped and draped in routine sterile fashion.  The  area was then anesthetized with 1% local lidocaine.  A 4 French arterial  sheath was placed in the right femoral artery using a modified Seldinger  technique.  All catheter exchanges were  made over a wire.  There were no  apparent complications.  At the end of the procedure, Mr. Benak was  transported to the holding area for removal of his vascular access.   FINDINGS:   HEMODYNAMICS:  Central aortic pressure was 131/80 with mean of 100.  LV  pressure was 142/24 with EDP of 30.  There was no gradient on aortic valve  pullback.   CORONARY ANATOMY:  1.  Left main was normal.  2.  LAD:  Long vessel wrapping the apex.  It gave off one large diagonal and      a small second diagonal.  There was a 40% lesion  in the proximal  LAD      and a 60% lesion in the mid LAD with a 30% lesion distally.  3.  The left circumflex gave off a very tiny ramus with a small OM-1 and      large branching OM-2.  There was a 40% lesion in the proximal portion of      the normal size OM-1 and a 60% lesion in the lower branch of the OM-2.  4.  Right coronary artery was dominant.  It gave off a large PDA and two      large PLs.  There was minor luminal irregularity in the mid section, but      otherwise no significant coronary disease.   Left ventriculogram done in the RAO approach showed an EF of approximately  45% with apical as well as inferior and anterior apical hypokinesis.   ASSESSMENT/PLAN:  1.  Borderline lesion in left anterior descending and lower branch of the  obtuse marginal two.  These do not appear flow limiting.  2.  Nonischemic cardiomyopathy with apical hypokinesis and mildly increased      filling pressures.  3.  Proceed with medical management.   The case was discussed with Dr. Antoine Poche and Dr. Loraine Leriche Pulsipher who also  reviewed the cine images.      DB/MEDQ  D:  12/11/2004  T:  12/11/2004  Job:  [id number]   cc:   Rollene Rotunda, M.D.   Ernestina Penna, M.D.  [address] [place]  [place] [address]  Fax: [phone]

## 2011-02-19 NOTE — H&P (Signed)
[name], [name] NO.:  [id number]   MEDICAL RECORD NO.:  [id number]          PATIENT TYPE:  INP   LOCATION:  2905                         FACILITY:  [facility]   PHYSICIAN:  Maple Mirza, PA   DATE OF BIRTH:  1956-01-13   DATE OF ADMISSION:  08/19/2006  DATE OF DISCHARGE:  08/20/2006                                HISTORY & PHYSICAL   PRIMARY CARE PHYSICIAN:  Dr. [name].   CARDIOLOGIST:  Dr. Antoine Poche.   ELECTROPHYSIOLOGIST:  Dr. Lewayne Bunting.   PRESENTING CIRCUMSTANCE:  He is now in the intensive care unit at [facility], wheeling into the electrophysiology lab.  He is alert and oriented and  comfortable, somewhat diaphoretic.  Zoll pacer was in place for symptomatic  complete heart block.   HISTORY OF PRESENT ILLNESS:  [name] is a 55 year old male who is  followed by Dr. Antoine Poche for hypertropic cardiomyopathy, nonischemic  cardiomyopathy, and a history of myocardial infarction at age 64.  He had an  office visit on May 7.  At that time, electrocardiogram showed right bundle  branch, left anterior vesicular block, and the patient was bradycardic.  His  Toprol was held at that time.   The patient also has a past medical history, which includes dyslipidemia,  diabetes and hypertension.   The patient is fairly functional.  He still works Microbiologist.  Most of this history is dictated from his wife.  She says that earlier this  morning, he awoke feeling fine and in his normal state of health.  He had no  chest pain, no shortness of breath, and he went to work; however, he did  fall out at work, and emergency medical services were called.  They recorded  complete heart block, with a ventricular escape in the 30s.  He had further  syncope x3 on route to the hospital.  His rhythm did not respond to  atropine.  On arrival, echocardiogram was 40%.  His wife says he had no  restrictions in his activities, and he has mixed, chronic - both  diastolic/systolic - class II congestive heart failure.  Echocardiogram was  taken today.  It showed an ejection fraction of 40% with akinesis of the  inferior wall.  He arrives with Zoll pacing pads, and he feels somewhat  better.  Skin tones is mottled.  He is diaphoretic, pallid, mild dyspnea.  His first troponin I study was 0.38.  He is taken to the EP lab for an  urgent pacer.   ALLERGIES:  NITROGLYCERINE, PROCAINAMIDE, QUINIDINE and MEXILETINE.   CARDIAC WORKUP:  He had previous cardiac workup, which included:  A. Catheterization, March 2006.  Ejection fraction 45%.  The left main was  without significant disease; the LAD had a 40% proximal, 60% mid, 30%  distal; left circumflex obtuse marginal #1 was small, 40% proximal; obtuse  marginal 2 was large, inferior branch was 60% stenosis; right coronary  artery minor luminal irregularities.  B. Echocardiogram, June 2007.  Ejection fraction 55%.  Diastolic  dysfunction, septal wall mall motion dyssynergy.  Left ventricular wall was  markedly increased in thickness.  C. Echocardiogram, August 19, 2006.  Ejection fraction  40%.  Akinesis  over the inferior wall.   SOCIAL HISTORY:  He lives in [place] with his wife of 29 years.  He does not  smoke.  Does not do drugs.  Occasional alcoholic beverages.   FAMILY HISTORY:  Father living.  He has diabetes.  No known coronary artery  disease.  Mother, her medical history is unknown.  He has one sister who is  alive and well.   PAST MEDICAL HISTORY:  1. Myocardial infarction at age 63, no intervention.  2. HOCM.  3. Nonischemic cardiomyopathy.  Decreased ejection fraction in      echocardiogram today, 40%.  4. Diabetes.  5. Hypertension.  6. Dyslipidemia.  7. Noted bradycardia at office visit in May 2007, and taken off Toprol.  8. 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  Now complete      heart block, which is symptomatic.   MEDICATIONS:  1. Actos 30 mg daily.  2.  Vytorin 10/40 at bedtime.  3. Welchol 625 mg 3 times daily.  4. Antara 130 mg daily.  5. Glimepiride 4 mg twice daily.  6. Lisinopril 40 mg daily.  7. Entericoated aspirin 325 mg daily.  8. Phenyl fibrate 160 mg daily.   RADIOLOGICAL STUDIES:  Chest x-ray on admission is cardiomegaly with both  lungs are clear.   LABORATORY STUDIES:  Cholesterol 129, triglycerides 130, HDL cholesterol is  41, LDL cholesterol is 62.  Serum electrolytes:  Sodium 133, potassium 5.6,  chloride 104, carbonate 23, BUN is 30, creatinine 1.4, and glucose is 220.  His first troponin I study was 0.38.  His complete blood count:  White cell  6.6, hemoglobin 15.2, hematocrit 44.3, and platelets 219.   IMPRESSION:  1. Presenting symptoms this admission:  Syncope, several times with      diaphoresis and hypotension.  Presenting diagnosis:  Complete heart      block.  2. Prior myocardial infarction at age 49.  No intervention.  3. Nonischemic cardiomyopathy, nonobstructive coronary artery disease at      catheterization, March 2006.      a.     Ejection fraction 40% at echocardiogram, August 19, 2006.  4. Atrial CM.  5. Right bundle branch/left anterior fascicular block.  6. Diabetes.  7. Hypertension.  8. Dyslipidemia.  9. Mixed, chronic, diastolic/systolic class II congestive heart failure.   PLAN:  1. Urgent pacemaker.  2. Sliding scale insulin with HGB A1c determination.  3. IV hydration post pacer.  4. Pain control.  5. Resume pre-op meds.  6. Cardiac enzymes q.8 hours x2 more.  7. Outpatient or inpatient Myoview.  The question is complete heart block      secondary to ischemic insult.      Maple Mirza, PA     GM/MEDQ  D:  08/19/2006  T:  08/20/2006  Job:  [id number]

## 2011-03-30 ENCOUNTER — Other Ambulatory Visit: Payer: Self-pay | Admitting: Cardiology

## 2011-03-31 ENCOUNTER — Telehealth: Payer: Self-pay | Admitting: Cardiology

## 2011-03-31 MED ORDER — LISINOPRIL 2.5 MG PO TABS: 2.5000 mg | ORAL_TABLET | Freq: Every day | ORAL | Status: DC

## 2011-03-31 NOTE — Telephone Encounter (Signed)
Lisinopril RX about to expire and no refills, could Dr. Antoine Poche write RX and mail it, or write another RX.

## 2011-04-27 ENCOUNTER — Encounter: Payer: Self-pay | Admitting: Internal Medicine

## 2011-04-29 ENCOUNTER — Other Ambulatory Visit: Payer: Self-pay | Admitting: Internal Medicine

## 2011-04-29 ENCOUNTER — Ambulatory Visit (INDEPENDENT_AMBULATORY_CARE_PROVIDER_SITE_OTHER): Payer: BC Managed Care – PPO | Admitting: *Deleted

## 2011-04-29 DIAGNOSIS — I5022 Chronic systolic (congestive) heart failure: Secondary | ICD-10-CM

## 2011-04-29 DIAGNOSIS — I442 Atrioventricular block, complete: Secondary | ICD-10-CM

## 2011-04-29 DIAGNOSIS — Z95810 Presence of automatic (implantable) cardiac defibrillator: Secondary | ICD-10-CM

## 2011-04-30 LAB — REMOTE ICD DEVICE
AL AMPLITUDE: 2.5 mv
AL IMPEDENCE ICD: 475 Ohm
ATRIAL PACING ICD: 76.01 pct
BAMS-0001: 170 {beats}/min
BRDY-0002LV: 60 {beats}/min
CHARGE TIME: 10.65 s
LV LEAD IMPEDENCE ICD: 475 Ohm
TOT-0001: 1
TOT-0002: 0
TOT-0006: 20100521000000
TZAT-0001ATACH: 2
TZAT-0001SLOWVT: 2
TZAT-0002ATACH: NEGATIVE
TZAT-0002ATACH: NEGATIVE
TZAT-0002ATACH: NEGATIVE
TZAT-0004FASTVT: 8
TZAT-0004SLOWVT: 8
TZAT-0005SLOWVT: 88 pct
TZAT-0005SLOWVT: 91 pct
TZAT-0011SLOWVT: 10 ms
TZAT-0011SLOWVT: 10 ms
TZAT-0012ATACH: 150 ms
TZAT-0012FASTVT: 200 ms
TZAT-0018ATACH: NEGATIVE
TZAT-0018ATACH: NEGATIVE
TZAT-0019ATACH: 6 V
TZAT-0019ATACH: 6 V
TZAT-0019ATACH: 6 V
TZAT-0019SLOWVT: 8 V
TZAT-0020ATACH: 1.5 ms
TZAT-0020ATACH: 1.5 ms
TZAT-0020FASTVT: 1.5 ms
TZON-0004VSLOWVT: 20
TZST-0001FASTVT: 4
TZST-0001SLOWVT: 3
TZST-0001SLOWVT: 4
TZST-0002ATACH: NEGATIVE
TZST-0003FASTVT: 25 J
TZST-0003FASTVT: 35 J
TZST-0003FASTVT: 35 J
TZST-0003FASTVT: 35 J
TZST-0003SLOWVT: 25 J
TZST-0003SLOWVT: 35 J

## 2011-05-04 NOTE — Progress Notes (Signed)
icd remote check w/icm  

## 2011-06-03 ENCOUNTER — Encounter: Payer: Self-pay | Admitting: *Deleted

## 2011-07-16 ENCOUNTER — Telehealth: Payer: Self-pay | Admitting: Physician Assistant

## 2011-07-16 NOTE — Telephone Encounter (Signed)
Pt wife called because pt had a syncopal episode today and his defib fired. He did not have CP/SOB. Currently he feels fine. She was concerned. Discussed w/ GT and called her back. Since only 1 defib, no need to come in as long as asymptomatic. Make sure he continues all Rx as prescribed. He may not drive until checked in ofc. Ofc to see on Mon to ck defib and eval. Call 911 and come to [facility] if more defibs, palps, CP or SOB. Pt wife stated she would watch him closely.

## 2011-07-17 ENCOUNTER — Encounter: Payer: Self-pay | Admitting: Internal Medicine

## 2011-07-17 ENCOUNTER — Other Ambulatory Visit: Payer: Self-pay | Admitting: Internal Medicine

## 2011-07-19 ENCOUNTER — Ambulatory Visit (INDEPENDENT_AMBULATORY_CARE_PROVIDER_SITE_OTHER): Payer: BC Managed Care – PPO | Admitting: *Deleted

## 2011-07-19 DIAGNOSIS — I442 Atrioventricular block, complete: Secondary | ICD-10-CM

## 2011-07-19 DIAGNOSIS — I428 Other cardiomyopathies: Secondary | ICD-10-CM

## 2011-07-19 DIAGNOSIS — Z95810 Presence of automatic (implantable) cardiac defibrillator: Secondary | ICD-10-CM

## 2011-07-20 ENCOUNTER — Inpatient Hospital Stay (HOSPITAL_COMMUNITY)
Admission: EM | Admit: 2011-07-20 | Discharge: 2011-07-24 | DRG: 544 | Disposition: A | Discharge status: Home / Self Care | Payer: BC Managed Care – PPO | Source: Ambulatory Visit | Attending: Internal Medicine | Admitting: Internal Medicine

## 2011-07-20 DIAGNOSIS — Z7982 Long term (current) use of aspirin: Secondary | ICD-10-CM

## 2011-07-20 DIAGNOSIS — Z95810 Presence of automatic (implantable) cardiac defibrillator: Secondary | ICD-10-CM

## 2011-07-20 DIAGNOSIS — E785 Hyperlipidemia, unspecified: Secondary | ICD-10-CM | POA: Diagnosis present

## 2011-07-20 DIAGNOSIS — Z833 Family history of diabetes mellitus: Secondary | ICD-10-CM

## 2011-07-20 DIAGNOSIS — I472 Ventricular tachycardia, unspecified: Principal | ICD-10-CM | POA: Diagnosis present

## 2011-07-20 DIAGNOSIS — N189 Chronic kidney disease, unspecified: Secondary | ICD-10-CM | POA: Diagnosis present

## 2011-07-20 DIAGNOSIS — I428 Other cardiomyopathies: Secondary | ICD-10-CM | POA: Diagnosis present

## 2011-07-20 DIAGNOSIS — Z79899 Other long term (current) drug therapy: Secondary | ICD-10-CM

## 2011-07-20 DIAGNOSIS — I442 Atrioventricular block, complete: Secondary | ICD-10-CM | POA: Diagnosis present

## 2011-07-20 DIAGNOSIS — Z794 Long term (current) use of insulin: Secondary | ICD-10-CM

## 2011-07-20 DIAGNOSIS — I4891 Unspecified atrial fibrillation: Secondary | ICD-10-CM | POA: Diagnosis present

## 2011-07-20 DIAGNOSIS — I4729 Other ventricular tachycardia: Principal | ICD-10-CM | POA: Diagnosis present

## 2011-07-20 DIAGNOSIS — E119 Type 2 diabetes mellitus without complications: Secondary | ICD-10-CM | POA: Diagnosis present

## 2011-07-20 DIAGNOSIS — E669 Obesity, unspecified: Secondary | ICD-10-CM | POA: Diagnosis present

## 2011-07-20 DIAGNOSIS — I129 Hypertensive chronic kidney disease with stage 1 through stage 4 chronic kidney disease, or unspecified chronic kidney disease: Secondary | ICD-10-CM | POA: Diagnosis present

## 2011-07-20 DIAGNOSIS — I509 Heart failure, unspecified: Secondary | ICD-10-CM | POA: Diagnosis present

## 2011-07-20 DIAGNOSIS — I2510 Atherosclerotic heart disease of native coronary artery without angina pectoris: Secondary | ICD-10-CM | POA: Diagnosis present

## 2011-07-20 DIAGNOSIS — Z87891 Personal history of nicotine dependence: Secondary | ICD-10-CM

## 2011-07-20 DIAGNOSIS — I5022 Chronic systolic (congestive) heart failure: Secondary | ICD-10-CM | POA: Diagnosis present

## 2011-07-21 ENCOUNTER — Encounter: Payer: BC Managed Care – PPO | Admitting: Internal Medicine

## 2011-07-21 LAB — CBC
Hemoglobin: 15.1 g/dL (ref 13.0–17.0)
MCH: 32.1 pg (ref 26.0–34.0)
MCV: 91.9 fL (ref 78.0–100.0)
Platelets: 175 10*3/uL (ref 150–400)
RBC: 4.7 MIL/uL (ref 4.22–5.81)
WBC: 8.7 10*3/uL (ref 4.0–10.5)

## 2011-07-21 LAB — REMOTE ICD DEVICE
AL AMPLITUDE: 1.75 mv
ATRIAL PACING ICD: 74.86 pct
BAMS-0001: 170 {beats}/min
BRDY-0002LV: 60 {beats}/min
BRDY-0003LV: 130 {beats}/min
BRDY-0004LV: 120 {beats}/min
FVT: 2
PACEART VT: 0
TZAT-0001ATACH: 3
TZAT-0001SLOWVT: 1
TZAT-0001SLOWVT: 2
TZAT-0002ATACH: NEGATIVE
TZAT-0002ATACH: NEGATIVE
TZAT-0002ATACH: NEGATIVE
TZAT-0012ATACH: 150 ms
TZAT-0012FASTVT: 200 ms
TZAT-0018ATACH: NEGATIVE
TZAT-0018ATACH: NEGATIVE
TZAT-0018SLOWVT: NEGATIVE
TZAT-0018SLOWVT: NEGATIVE
TZAT-0019ATACH: 6 V
TZAT-0019ATACH: 6 V
TZAT-0019FASTVT: 8 V
TZAT-0019SLOWVT: 8 V
TZAT-0019SLOWVT: 8 V
TZAT-0020ATACH: 1.5 ms
TZAT-0020FASTVT: 1.5 ms
TZON-0005SLOWVT: 12
TZST-0001ATACH: 5
TZST-0001ATACH: 6
TZST-0001FASTVT: 4
TZST-0001FASTVT: 6
TZST-0001SLOWVT: 3
TZST-0001SLOWVT: 4
TZST-0002ATACH: NEGATIVE
TZST-0002ATACH: NEGATIVE
TZST-0002ATACH: NEGATIVE
TZST-0003FASTVT: 25 J
TZST-0003FASTVT: 35 J
TZST-0003FASTVT: 35 J
TZST-0003SLOWVT: 35 J
TZST-0003SLOWVT: 35 J
VENTRICULAR PACING ICD: 98.69 pct
VF: 0

## 2011-07-21 LAB — BASIC METABOLIC PANEL
CO2: 25 mEq/L (ref 19–32)
Calcium: 10.6 mg/dL — ABNORMAL HIGH (ref 8.4–10.5)
Chloride: 104 mEq/L (ref 96–112)
Creatinine, Ser: 1.44 mg/dL — ABNORMAL HIGH (ref 0.50–1.35)
GFR calc Af Amer: 66 mL/min — ABNORMAL LOW (ref >90)
GFR calc non Af Amer: 53 mL/min — ABNORMAL LOW (ref >90)
GFR calc non Af Amer: 57 mL/min — ABNORMAL LOW (ref >90)
Glucose, Bld: 158 mg/dL — ABNORMAL HIGH (ref 70–99)
Potassium: 4.2 mEq/L (ref 3.5–5.1)
Sodium: 139 mEq/L (ref 135–145)
Sodium: 139 mEq/L (ref 135–145)

## 2011-07-21 LAB — CARDIAC PANEL(CRET KIN+CKTOT+MB+TROPI)
CK, MB: 4.1 ng/mL — ABNORMAL HIGH (ref 0.3–4.0)
Relative Index: INVALID (ref 0.0–2.5)
Relative Index: 4.3 — ABNORMAL HIGH (ref 0.0–2.5)
Troponin I: 0.37 ng/mL (ref <0.30)

## 2011-07-21 LAB — DIFFERENTIAL
Eosinophils Absolute: 0.4 10*3/uL (ref 0.0–0.7)
Lymphocytes Relative: 30 % (ref 12–46)
Lymphs Abs: 2.6 10*3/uL (ref 0.7–4.0)
Monocytes Relative: 9 % (ref 3–12)
Neutro Abs: 4.9 10*3/uL (ref 1.7–7.7)
Neutrophils Relative %: 56 % (ref 43–77)

## 2011-07-21 LAB — MAGNESIUM: Magnesium: 1.6 mg/dL (ref 1.5–2.5)

## 2011-07-21 LAB — CK TOTAL AND CKMB (NOT AT ARMC)
Relative Index: 3.4 — ABNORMAL HIGH (ref 0.0–2.5)
Relative Index: 3.3 — ABNORMAL HIGH (ref 0.0–2.5)
Total CK: 106 U/L (ref 7–232)

## 2011-07-21 LAB — HEMOGLOBIN A1C: Hgb A1c MFr Bld: 8.1 % — ABNORMAL HIGH (ref <5.7)

## 2011-07-21 LAB — PROTIME-INR: Prothrombin Time: 13.1 seconds (ref 11.6–15.2)

## 2011-07-21 LAB — TROPONIN I: Troponin I: 0.4 ng/mL (ref <0.30)

## 2011-07-21 LAB — PRO B NATRIURETIC PEPTIDE: Pro B Natriuretic peptide (BNP): 1136 pg/mL — ABNORMAL HIGH (ref 0–125)

## 2011-07-21 LAB — APTT: aPTT: 27 seconds (ref 24–37)

## 2011-07-21 NOTE — Progress Notes (Signed)
icd remote check  

## 2011-07-22 DIAGNOSIS — I472 Ventricular tachycardia, unspecified: Secondary | ICD-10-CM

## 2011-07-22 LAB — BASIC METABOLIC PANEL
CO2: 28 mEq/L (ref 19–32)
Calcium: 10.3 mg/dL (ref 8.4–10.5)
Creatinine, Ser: 1.41 mg/dL — ABNORMAL HIGH (ref 0.50–1.35)
GFR calc non Af Amer: 55 mL/min — ABNORMAL LOW (ref >90)
Sodium: 137 mEq/L (ref 135–145)

## 2011-07-22 LAB — GLUCOSE, CAPILLARY
Glucose-Capillary: 140 mg/dL — ABNORMAL HIGH (ref 70–99)
Glucose-Capillary: 74 mg/dL (ref 70–99)
Glucose-Capillary: 183 mg/dL — ABNORMAL HIGH (ref 70–99)

## 2011-07-22 LAB — MAGNESIUM: Magnesium: 1.6 mg/dL (ref 1.5–2.5)

## 2011-07-23 DIAGNOSIS — I517 Cardiomegaly: Secondary | ICD-10-CM

## 2011-07-23 LAB — GLUCOSE, CAPILLARY
Glucose-Capillary: 72 mg/dL (ref 70–99)
Glucose-Capillary: 180 mg/dL — ABNORMAL HIGH (ref 70–99)
Glucose-Capillary: 167 mg/dL — ABNORMAL HIGH (ref 70–99)

## 2011-07-23 LAB — HEPATIC FUNCTION PANEL
ALT: 16 U/L (ref 0–53)
AST: 20 U/L (ref 0–37)
Albumin: 3.2 g/dL — ABNORMAL LOW (ref 3.5–5.2)
Bilirubin, Direct: <0.1 mg/dL (ref 0.0–0.3)
Total Bilirubin: 0.3 mg/dL (ref 0.3–1.2)

## 2011-07-23 LAB — TSH: TSH: 5.437 u[IU]/mL — ABNORMAL HIGH (ref 0.350–4.500)

## 2011-07-23 NOTE — H&P (Signed)
[name], [name] NO.:  [id number]  MEDICAL RECORD NO.:  [id number]  LOCATION:  2903                         FACILITY:  [facility]  PHYSICIAN:  [name], M.D.     DATE OF BIRTH:  1955-12-12  DATE OF ADMISSION:  07/20/2011 DATE OF DISCHARGE:                             HISTORY & PHYSICAL   REFERRING PHYSICIAN:  Dr. Fredricka [place].  PRIMARY CARDIOLOGIST:  [place]akota Rotunda, MD, [facility] and [name]. [place] Rid[place], MD  CHIEF COMPLAINT:  Near syncope.  HISTORY OF PRESENT ILLNESS:  This is a 55 year old male with a history of nonischemic dilated cardiomyopathy, chronic renal failure, complete heart block, status post permanent pacemaker placement, hypertrophic obstructive cardiomyopathy, diabetes, hypertension and upgrade of his permanent pacemaker to a BiV AICD who was in his usual state of health until he had near-syncope on Friday.  He says he was in his usual state of health Friday and had near syncope just out of the blue and then felt his AICD fired.  He notified Dr. Lubertha Holloway office and was supposed to be seen by Dr. Ladona [place] tomorrow.  He then on the day of admission had another near syncopal episode at which time, his AICD fired.  Currently, he is asymptomatic.  He denies any chest pain, shortness of breath, nausea, vomiting or diaphoresis.  PAST MEDICAL HISTORY:  Nonischemic dilated cardiomyopathy, EF 40-45% at cath, nonsustained ventricular tachycardia, status post upgrade of permanent pacemaker to BiV AICD, chronic renal insufficiency, diabetes mellitus, obesity, dyslipidemia, history of complete heart block, status post permanent pacemaker placement, nonobstructive coronary artery disease by cath in 2006, hypertension, HOCM, and then upgrade of his pacemaker to BiV AICD at the time of his nonsustained VT.  ALLERGIES:  To NITROGLYCERIN.  He denies any IVP DYE allergy or SHELLFISH allergy.  PAST SURGICAL HISTORY:  Significant for pacemaker and upgrade of pacemaker to  BiV AICD.  SOCIAL HISTORY:  He lives in [name] with his wife.  His son has HOCM. He denies any tobacco, alcohol use, no IV drug use.  FAMILY HISTORY:  His father has a history of diabetes.  REVIEW OF SYSTEMS:  All 10-point review of systems otherwise is negative except what stated in the HPI.  MEDICATIONS: 1. Welchol 625 mg t.i.d. 2. Metformin 500 mg 4 tablets daily. 3. Glimepiride 4 mg b.i.d. 4. Actos 30 mg daily. 5. Vitamin 10/40 mg daily. 6. Furosemide 40 mg daily. 7. Aspirin 325 mg daily. 8. Trilipix 135 mg daily. 9. Carvedilol 6.25 mg 2 tablets b.i.d. 10.Fish oil. 11.Januvia 100 mg daily. 12.Vitamin D3 1000 units t.i.d. 13.Lantus 35 units daily.  PHYSICAL EXAMINATION:  VITAL SIGNS:  Blood pressure is 110/62, heart rate 69. GENERAL:  He is a well-developed, well-nourished male, in no acute distress. HEENT:  Benign. NECK:  Supple without lymphadenopathy.  Carotid upstrokes +2 bilaterally.  No bruits. LUNGS:  Clear to auscultation throughout. HEART:  Regular rate and rhythm.  No murmurs, rubs, or gallops.  Normal S1 and S2. ABDOMEN:  Soft, nontender and nondistended.  Normoactive bowel sounds. No hepatosplenomegaly. EXTREMITIES:  No cyanosis, erythema, or edema.  LABORATORY DATA:  White cell count 8.7, hemoglobin 15.1, hematocrit 43.2 and platelet count 175.  Sodium  139, potassium 3.8, chloride 104, bicarb 25, BUN 22, creatinine 1.44, magnesium 1.6, CPK 106, MB 30.6 and troponin 0.35.  Chest x-ray is pending.  EKG shows AV paced with frequent PVCs.  AICD interrogation showed observed 3 fast VT therapies with 3 shocks and 1 ATP therapy.  ASSESSMENT: 1. Near syncope secondary to ventricular tachycardia/ventricular     fibrillation. 2. Ventricular tachycardia/ventricular fibrillation, status post an     implantable cardioverter defibrillator shock x3. 3. History of hypertrophic obstructive cardiomyopathy. 4. Nonischemic dilated cardiomyopathy, status post  biventricular an     implantable cardioverter defibrillator. 5. Chronic renal insufficiency. 6. History of nonsustained ventricular tachycardia. 7. Hypertension. 8. Diabetes.  PLAN:  Admit to Telemetry Unit.  We will continue current medications. We will cycle cardiac enzymes.  We will start on IV amiodarone drip with 150 mg bolus IV and then drip at 1 mg/minute for 6 hours, then 0.5 mg/minute.  Further workup per Dr. Lewayne Hollow[place].     [name], M.D.     TT/MEDQ  D:  07/21/2011  T:  07/21/2011  Job:  [id number]  cc:   [name]. [place], MD  Electronically Signed by [name] M.D. on 07/23/2011 08:33:29 PM

## 2011-07-24 DIAGNOSIS — I472 Ventricular tachycardia: Secondary | ICD-10-CM

## 2011-07-24 LAB — GLUCOSE, CAPILLARY

## 2011-07-26 ENCOUNTER — Emergency Department (HOSPITAL_COMMUNITY): Payer: BC Managed Care – PPO

## 2011-07-26 ENCOUNTER — Telehealth: Payer: Self-pay | Admitting: *Deleted

## 2011-07-26 ENCOUNTER — Inpatient Hospital Stay (HOSPITAL_COMMUNITY)
Admission: EM | Admit: 2011-07-26 | Discharge: 2011-08-03 | DRG: 550 | Disposition: A | Discharge status: Home / Self Care | Payer: BC Managed Care – PPO | Source: Ambulatory Visit | Attending: Cardiology | Admitting: Cardiology

## 2011-07-26 DIAGNOSIS — I421 Obstructive hypertrophic cardiomyopathy: Secondary | ICD-10-CM | POA: Diagnosis present

## 2011-07-26 DIAGNOSIS — E785 Hyperlipidemia, unspecified: Secondary | ICD-10-CM | POA: Diagnosis present

## 2011-07-26 DIAGNOSIS — I5023 Acute on chronic systolic (congestive) heart failure: Secondary | ICD-10-CM | POA: Diagnosis not present

## 2011-07-26 DIAGNOSIS — I2510 Atherosclerotic heart disease of native coronary artery without angina pectoris: Secondary | ICD-10-CM | POA: Diagnosis present

## 2011-07-26 DIAGNOSIS — Z79899 Other long term (current) drug therapy: Secondary | ICD-10-CM

## 2011-07-26 DIAGNOSIS — Z7982 Long term (current) use of aspirin: Secondary | ICD-10-CM

## 2011-07-26 DIAGNOSIS — I472 Ventricular tachycardia, unspecified: Principal | ICD-10-CM | POA: Diagnosis present

## 2011-07-26 DIAGNOSIS — Z95810 Presence of automatic (implantable) cardiac defibrillator: Secondary | ICD-10-CM

## 2011-07-26 DIAGNOSIS — I4729 Other ventricular tachycardia: Principal | ICD-10-CM | POA: Diagnosis present

## 2011-07-26 DIAGNOSIS — N183 Chronic kidney disease, stage 3 unspecified: Secondary | ICD-10-CM | POA: Diagnosis present

## 2011-07-26 DIAGNOSIS — E119 Type 2 diabetes mellitus without complications: Secondary | ICD-10-CM | POA: Diagnosis present

## 2011-07-26 DIAGNOSIS — E669 Obesity, unspecified: Secondary | ICD-10-CM | POA: Diagnosis present

## 2011-07-26 DIAGNOSIS — I129 Hypertensive chronic kidney disease with stage 1 through stage 4 chronic kidney disease, or unspecified chronic kidney disease: Secondary | ICD-10-CM | POA: Diagnosis present

## 2011-07-26 DIAGNOSIS — I509 Heart failure, unspecified: Secondary | ICD-10-CM | POA: Diagnosis present

## 2011-07-26 DIAGNOSIS — Z794 Long term (current) use of insulin: Secondary | ICD-10-CM

## 2011-07-26 LAB — BASIC METABOLIC PANEL
BUN: 35 mg/dL — ABNORMAL HIGH (ref 6–23)
CO2: 23 mEq/L (ref 19–32)
Chloride: 103 mEq/L (ref 96–112)
Creatinine, Ser: 1.59 mg/dL — ABNORMAL HIGH (ref 0.50–1.35)
Potassium: 5 mEq/L (ref 3.5–5.1)

## 2011-07-26 LAB — DIFFERENTIAL
Eosinophils Relative: 4 % (ref 0–5)
Lymphocytes Relative: 22 % (ref 12–46)
Lymphs Abs: 2.9 10*3/uL (ref 0.7–4.0)
Monocytes Absolute: 0.9 10*3/uL (ref 0.1–1.0)
Monocytes Relative: 7 % (ref 3–12)

## 2011-07-26 LAB — POCT I-STAT, CHEM 8
BUN: 36 mg/dL — ABNORMAL HIGH (ref 6–23)
Calcium, Ion: 1.35 mmol/L — ABNORMAL HIGH (ref 1.12–1.32)
Chloride: 108 meq/L (ref 96–112)
Creatinine, Ser: 1.8 mg/dL — ABNORMAL HIGH (ref 0.50–1.35)
Glucose, Bld: 205 mg/dL — ABNORMAL HIGH (ref 70–99)
HCT: 49 % (ref 39.0–52.0)
Hemoglobin: 16.7 g/dL (ref 13.0–17.0)
Potassium: 4.7 meq/L (ref 3.5–5.1)
Sodium: 137 meq/L (ref 135–145)
TCO2: 22 mmol/L (ref 0–100)

## 2011-07-26 LAB — CARDIAC PANEL(CRET KIN+CKTOT+MB+TROPI)
CK, MB: 5.1 ng/mL — ABNORMAL HIGH (ref 0.3–4.0)
Relative Index: INVALID (ref 0.0–2.5)
Troponin I: 0.46 ng/mL (ref <0.30)

## 2011-07-26 LAB — POCT I-STAT TROPONIN I: Troponin i, poc: 0.08 ng/mL (ref 0.00–0.08)

## 2011-07-26 LAB — CK TOTAL AND CKMB (NOT AT ARMC)
CK, MB: 4.7 ng/mL — ABNORMAL HIGH (ref 0.3–4.0)
Relative Index: INVALID (ref 0.0–2.5)

## 2011-07-26 LAB — GLUCOSE, CAPILLARY: Glucose-Capillary: 182 mg/dL — ABNORMAL HIGH (ref 70–99)

## 2011-07-26 LAB — T3, FREE: T3, Free: 2.7 pg/mL (ref 2.3–4.2)

## 2011-07-26 LAB — CBC
HCT: 45.3 % (ref 39.0–52.0)
Hemoglobin: 15.8 g/dL (ref 13.0–17.0)
MCH: 31.9 pg (ref 26.0–34.0)
MCHC: 34.9 g/dL (ref 30.0–36.0)
MCV: 91.3 fL (ref 78.0–100.0)
RDW: 12.9 % (ref 11.5–15.5)

## 2011-07-26 LAB — TROPONIN I: Troponin I: 0.38 ng/mL (ref <0.30)

## 2011-07-26 LAB — HEPARIN LEVEL (UNFRACTIONATED): Heparin Unfractionated: 0.21 IU/mL — ABNORMAL LOW (ref 0.30–0.70)

## 2011-07-26 NOTE — Telephone Encounter (Signed)
[phone]---still unable to reach pt by either phone #.

## 2011-07-26 NOTE — Telephone Encounter (Signed)
Spoke with pt and pt has had 3 faint spells but at time when I talked to pt felt good. Pt would like to be seen by Dr [name] today. Instructed that I would call [facility] office and see if could be seen there but if having faint spells needs to call 911 and go to ER. Spoke with Gunnar Fusi and after talking decided it would probably be best for pt if went to ER. Tried to call pt several times on home # and cell # with no luck. Cell # says person trying to reach is not accepting calls and home # just rings with no answering machine.

## 2011-07-26 NOTE — Telephone Encounter (Signed)
PT CALLED THIS AM  COMPLAINING OF FEELING FAINT  PER PT DEFIB FIRED THIS AM AROUND 4:00 AM  WAS RECENTLY STARTED  ON AMIODARONE 200 MG 2 TABS BID  NO WAY WAY OF CHECKING B/P OR HEART RATE PT WANTING TO SEE DR [name] TODAY  WILL DISCUSS WITH KRISTIN BANKSTON IN [facility]ICE [facility] PT AWARE WILL CALL BACK WITH PLAN OF CARE./CY

## 2011-07-26 NOTE — Telephone Encounter (Signed)
Pt still not available on either phone.

## 2011-07-27 DIAGNOSIS — I2510 Atherosclerotic heart disease of native coronary artery without angina pectoris: Secondary | ICD-10-CM

## 2011-07-27 LAB — BASIC METABOLIC PANEL
CO2: 25 mEq/L (ref 19–32)
Chloride: 104 mEq/L (ref 96–112)
Glucose, Bld: 95 mg/dL (ref 70–99)
Potassium: 4.1 mEq/L (ref 3.5–5.1)
Sodium: 138 mEq/L (ref 135–145)

## 2011-07-27 LAB — POCT I-STAT 3, VENOUS BLOOD GAS (G3P V)
Acid-base deficit: 5 mmol/L — ABNORMAL HIGH (ref 0.0–2.0)
O2 Saturation: 59 %
pCO2, Ven: 46.3 mmHg (ref 45.0–50.0)
pO2, Ven: 35 mmHg (ref 30.0–45.0)

## 2011-07-27 LAB — POCT I-STAT 3, ART BLOOD GAS (G3+)
Acid-base deficit: 3 mmol/L — ABNORMAL HIGH (ref 0.0–2.0)
O2 Saturation: 94 %
pO2, Arterial: 72 mmHg — ABNORMAL LOW (ref 80.0–100.0)

## 2011-07-27 LAB — GLUCOSE, CAPILLARY
Glucose-Capillary: 94 mg/dL (ref 70–99)
Glucose-Capillary: 99 mg/dL (ref 70–99)
Glucose-Capillary: 193 mg/dL — ABNORMAL HIGH (ref 70–99)
Glucose-Capillary: 238 mg/dL — ABNORMAL HIGH (ref 70–99)

## 2011-07-27 LAB — HEPARIN LEVEL (UNFRACTIONATED): Heparin Unfractionated: 0.49 IU/mL (ref 0.30–0.70)

## 2011-07-27 LAB — CARDIAC PANEL(CRET KIN+CKTOT+MB+TROPI)
Relative Index: INVALID (ref 0.0–2.5)
Total CK: 85 U/L (ref 7–232)

## 2011-07-27 LAB — MAGNESIUM: Magnesium: 1.8 mg/dL (ref 1.5–2.5)

## 2011-07-27 LAB — CBC
Hemoglobin: 14.1 g/dL (ref 13.0–17.0)
MCH: 30.7 pg (ref 26.0–34.0)
MCHC: 33.7 g/dL (ref 30.0–36.0)
Platelets: 190 10*3/uL (ref 150–400)

## 2011-07-27 NOTE — Telephone Encounter (Signed)
Tried to call pt and see how was feeling. No answer on home or cell [phone].

## 2011-07-27 NOTE — Cardiovascular Report (Signed)
[name], [name] NO.:  [id number]  MEDICAL RECORD NO.:  [id number]  LOCATION:  2905                         FACILITY:  [facility]  PHYSICIAN:  Verne Carrow, MDDATE OF BIRTH:  July 16, 1956  DATE OF PROCEDURE:  07/27/2011 DATE OF DISCHARGE:                           CARDIAC CATHETERIZATION   PRIMARY CARDIOLOGIST:  Rollene Rotunda, MD, [facility]  PRIMARY ELECTROPHYSIOLOGIST:  Doylene Canning. [name], MD  PRIMARY CARE PHYSICIAN:  Ernestina Penna, MD at [facility].  PROCEDURE PERFORMED: 1. Left heart catheterization. 2. Right heart catheterization. 3. Selective coronary angiography.  OPERATOR:  Verne Carrow, MD  INDICATION:  This is a 55 year old Caucasian male with a history of nonischemic cardiomyopathy with ejection fraction of 25% to 30% who has a defibrillator in place.  He is admitted with recurrent ventricular tachycardia.  Cardiac catheterization was arranged today to assess his volume status as well as to rule out an ischemic focus for his ventricular tachycardia.  DETAILS OF PROCEDURE:  The patient was brought to the main cardiac catheterization laboratory after signing informed consent for the procedure.  The right groin was prepped and draped in sterile fashion. 1% lidocaine was used for local anesthesia.  A 5-French sheath was inserted into the right femoral artery without difficulty.  A 7-French sheath was inserted into the right femoral vein without difficulty. Right heart catheterization was performed with a balloon-tipped catheter.  Standard diagnostic catheters were used to perform selective coronary angiography.  A pigtail catheter was used to measure left ventricular pressures.  No left ventricular angiogram was performed. The patient tolerated the procedure well.  There were no immediate complications.  HEMODYNAMIC FINDINGS:  Central aortic pressure 111/70.  Left ventricular pressure 106/16/21.  Right atrial  pressure 14.  Right ventricular pressure 51/10/20.  Pulmonary artery 54/28 with a mean of 38.  Pulmonary capillary wedge pressure, mean of 24.  Cardiac output 4.8 L/minute. Cardiac index 1.98 L/minute per meter squared.  Pulmonary artery saturation 59%.  Central aortic saturation 94%.  ANGIOGRAPHIC FINDINGS: 1. The left main coronary artery had no evidence of disease. 2. The left anterior descending was a large vessel that coursed to the     apex and gave off a moderate-sized diagonal branch.  The proximal     LAD had a 50% stenosis.  The mid LAD had a 60% stenosis.  This     appeared grossly unchanged from his prior catheterization 2 years     ago. 3. The circumflex artery is a large-caliber vessel that gives off a     small to moderate-sized first obtuse marginal branch.  The first     obtuse marginal branch has a 50% ostial stenosis.  There is a 40%     stenosis in the second obtuse marginal branch.  There is no flow-     limiting lesions noted in this system.  This is unchanged from     prior catheterization. 4. The right coronary artery is a large dominant vessel with 40% mid     stenosis. 5. No left ventricular angiogram was performed.  IMPRESSION: 1. Moderate nonobstructive coronary artery disease with no change from     the catheterization in  2010. 2. Ventricular tachycardia secondary to nonischemic cardiomyopathy     with no focal coronary stenosis.  RECOMMENDATIONS:  At this point, I recommend electrophysiology evaluation for potential ventricular tachycardia ablation.     Verne Carrow, MD     CM/MEDQ  D:  07/27/2011  T:  07/27/2011  Job:  [id number]  cc:   Rollene Rotunda, MD, Emelda Fear. [name], MD Ernestina Penna, M.D.  Electronically Signed by Verne Carrow MD on 07/27/2011 04:54:09 PM

## 2011-07-28 LAB — GLUCOSE, CAPILLARY
Glucose-Capillary: 202 mg/dL — ABNORMAL HIGH (ref 70–99)
Glucose-Capillary: 88 mg/dL (ref 70–99)
Glucose-Capillary: 64 mg/dL — ABNORMAL LOW (ref 70–99)
Glucose-Capillary: 97 mg/dL (ref 70–99)

## 2011-07-28 LAB — CBC
HCT: 42.9 % (ref 39.0–52.0)
Hemoglobin: 14.7 g/dL (ref 13.0–17.0)
MCHC: 34.3 g/dL (ref 30.0–36.0)
RDW: 12.7 % (ref 11.5–15.5)
WBC: 11.6 10*3/uL — ABNORMAL HIGH (ref 4.0–10.5)

## 2011-07-28 LAB — POCT ACTIVATED CLOTTING TIME: Activated Clotting Time: 232 seconds

## 2011-07-28 LAB — BASIC METABOLIC PANEL
Chloride: 106 mEq/L (ref 96–112)
GFR calc Af Amer: 63 mL/min — ABNORMAL LOW (ref >90)
GFR calc non Af Amer: 55 mL/min — ABNORMAL LOW (ref >90)
Potassium: 3.8 mEq/L (ref 3.5–5.1)
Sodium: 139 mEq/L (ref 135–145)

## 2011-07-29 ENCOUNTER — Encounter: Payer: BC Managed Care – PPO | Admitting: *Deleted

## 2011-07-29 LAB — POCT ACTIVATED CLOTTING TIME: Activated Clotting Time: 210 seconds

## 2011-07-29 LAB — GLUCOSE, CAPILLARY
Glucose-Capillary: 154 mg/dL — ABNORMAL HIGH (ref 70–99)
Glucose-Capillary: 210 mg/dL — ABNORMAL HIGH (ref 70–99)
Glucose-Capillary: 158 mg/dL — ABNORMAL HIGH (ref 70–99)

## 2011-07-29 NOTE — Discharge Summary (Signed)
NAMEJAMUS, [name] NO.:  [id number]  MEDICAL RECORD NO.:  [id number]  LOCATION:  2028                         FACILITY:  [facility]  PHYSICIAN:  Noralyn Pick. Eden Emms, MD, FACCDATE OF BIRTH:  July 03, 1956  DATE OF ADMISSION:  07/20/2011 DATE OF DISCHARGE:                              DISCHARGE SUMMARY   PRIMARY CARDIOLOGIST:  Rollene Rotunda, MD, [facility]  PRIMARY ELECTROPHYSIOLOGIST:  Doylene Canning. [name], MD  DISCHARGE DIAGNOSES: 1. Ventricular tachycardia/fibrillation.     a.     Associated near syncope.     b.     Status post automatic implantable cardioverter defibrillator      shock.     c.     Amiodarone therapy initiated.     d.     Status post implantable cardioverter defibrillator      interrogation/reprogramming. 2. Elevated TSH level.  SECONDARY DIAGNOSES: 1. Nonischemic dilated cardiomyopathy.     a.     Ejection fraction 25-30%, by 2-D echo, this admission. 2. Complete heart block.     a.     Status post permanent pacemaker and upgrade to biventricular      automatic implantable cardioverter defibrillator. 3. Hypertrophic obstructive cardiomyopathy. 4. Hypertension. 5. Diabetes mellitus. 6. Chronic kidney disease. 7. Hyperlipidemia.  REASON FOR ADMISSION:  The patient is a 55 year old male, with complex medical history, as outlined above, who presented with complaint of AICD discharge with associated near syncope.  EKG indicated atrioventricular pacing with frequent PVCs, and ICD interrogation yielded 3 episodes of rapid rate ventricular tachycardia, requiring 3 shocks and 1 ATP therapy.  HOSPITAL COURSE:  The patient was started on intravenous amiodarone with initial 150 mg IV bolus, and ultimately transitioned to 400 mg b.i.d., by time of discharge.  He was seen in consultation by Dr. Lewayne Bunting,  who indicated that this was due to recurrent, fast ventricular tachycardia,  with associated syncope.  ICD interrogation demonstrated VT, and he noted    that an additional level of ATP was added.  Dr. [name] recommended that the patient not drive for 6 months, and that this was discussed with the patient.  A 2-D echocardiogram was done, indicating severe LVD (EF 25-30%), with global hypokinesis, severe LVH, and akinesis of the apical wall, with no significant valvular abnormalities.  Serial cardiac markers were drawn and notable for mildly elevated troponins, with flat pattern in the 0.3-4.0 range, with initial normal MBs.  TSH level was drawn and slightly elevated at 5.4, with recommended repeat level in 6-8 weeks.  The patient was ultimately cleared for discharge on hospital day #4, in hemodynamically stable condition.  DISPOSITION AT DISCHARGE:  Stable.  DISCHARGE LABORATORY DATA:  None.  OUTSTANDING LABORATORY DATA:  Normal CBC on admission.  Sodium 137, potassium 4.8, BUN 18, creatinine 1.4 on July 22, 2011.  Magnesium 1.6.  Cardiac markers:  Peak CPK 106 with peak MB fraction 4.1; peak troponin-I 0.4.  BNP level 1100 on admission.  TSH 5.4.  FOLLOWUP:1. Dr. Lewayne Bunting in 2 weeks. 2. Dr. Rollene Rotunda in 4 weeks. 3. Arrangements to be made through our office. 4. Recommended followup TSH level in 6-8 weeks.  DISCHARGE MEDICATIONS: 1. Amiodarone  400 mg b.i.d. 2. Actos 30 mg daily. 3. Aspirin 325 mg daily. 4. Carvedilol 12.5 mg b.i.d. 5. Fish oil 1 tablet t.i.d. 6. Furosemide 40 mg daily. 7. Amaryl 4 mg b.i.d. 8. Januvia 100 mg daily. 9. Lantus 35 units q.a.m. 10.Lisinopril 40 mg daily. 11.Metformin 1000 mg b.i.d. 12.Vytorin 10/40 one tablet at bedtime. 13.WelChol 625 mg t.i.d. 14.Trilipix 135 mg daily.  DURATION OF DISCHARGE ENCOUNTER:  Greater than 30 minutes, including physician time.     Gene Serpe, PA-C   ______________________________ Noralyn Pick. Eden Emms, MD, [facility]    GS/MEDQ  D:  07/24/2011  T:  07/24/2011  Job:  [id number]  cc:   Kennon Rounds, MD  Electronically Signed by Rozell Searing PA-C  on 07/25/2011 02:21:19 PM Electronically Signed by Charlton Haws MD [facility] on 07/29/2011 12:27:55 PM

## 2011-07-30 ENCOUNTER — Inpatient Hospital Stay (HOSPITAL_COMMUNITY): Payer: BC Managed Care – PPO

## 2011-07-30 LAB — GLUCOSE, CAPILLARY: Glucose-Capillary: 103 mg/dL — ABNORMAL HIGH (ref 70–99)

## 2011-07-30 LAB — BASIC METABOLIC PANEL
Calcium: 9.5 mg/dL (ref 8.4–10.5)
Chloride: 105 mEq/L (ref 96–112)
Creatinine, Ser: 1.29 mg/dL (ref 0.50–1.35)
GFR calc Af Amer: 71 mL/min — ABNORMAL LOW (ref >90)

## 2011-07-31 DIAGNOSIS — I5023 Acute on chronic systolic (congestive) heart failure: Secondary | ICD-10-CM

## 2011-07-31 LAB — BASIC METABOLIC PANEL
BUN: 15 mg/dL (ref 6–23)
CO2: 26 mEq/L (ref 19–32)
Calcium: 9.8 mg/dL (ref 8.4–10.5)
Creatinine, Ser: 1.38 mg/dL — ABNORMAL HIGH (ref 0.50–1.35)
Glucose, Bld: 102 mg/dL — ABNORMAL HIGH (ref 70–99)

## 2011-07-31 LAB — GLUCOSE, CAPILLARY: Glucose-Capillary: 221 mg/dL — ABNORMAL HIGH (ref 70–99)

## 2011-08-01 LAB — GLUCOSE, CAPILLARY
Glucose-Capillary: 175 mg/dL — ABNORMAL HIGH (ref 70–99)
Glucose-Capillary: 84 mg/dL (ref 70–99)
Glucose-Capillary: 206 mg/dL — ABNORMAL HIGH (ref 70–99)
Glucose-Capillary: 179 mg/dL — ABNORMAL HIGH (ref 70–99)
Glucose-Capillary: 237 mg/dL — ABNORMAL HIGH (ref 70–99)

## 2011-08-01 NOTE — Consult Note (Signed)
[name], [name] NO.:  [id number]  MEDICAL RECORD NO.:  [id number]  LOCATION:  2919                         FACILITY:  [facility]  PHYSICIAN:  Doylene Canning. Dakota Ridgel, MD    DATE OF BIRTH:  [birth year]  DATE OF CONSULTATION:  07/22/2011 DATE OF DISCHARGE:                                CONSULTATION   REQUESTING PHYSICIAN:  Dr. Rollene Rotunda.  INDICATION FOR CONSULTATION:  Evaluation of recurrent ventricular tachycardia with multiple ICD discharges.  PRESENT ILLNESS:  The patient is a 55 year old male with complete heart block, hypertrophic cardiomyopathy, chronic systolic heart failure, status post BiV-ICD implantation.  His heart failure has been class 2 with his device implantation.  He was in his usual state of health when he had a near syncopal episode several days ago, followed by an ICD shock.  Two days later, he had a recurrent episode and was admitted to the hospital for additional evaluation.  Subsequent defibrillator interrogations demonstrated recurrent episodes of sustained monomorphic ventricular tachycardia.  The cycle lengths around 270 to 280 msec. Most of these have required ICD shock, the one was pace terminated.  The patient feels dizzy and lightheaded before each episode but has not had frank syncope with them.  He denies anginal symptoms.  His heart failure has not worsened.  PAST MEDICAL HISTORY:  As noted in the HPI.  He also has diabetes and hypertension.  He has chronic stage II renal insufficiency.  SOCIAL HISTORY:  The patient lives in [place] with his wife. 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  He denies alcohol or tobacco abuse, although he did smoke cigarettes in the past.  FAMILY HISTORY:  Notable for diabetes.  His mother's history is unknown.  REVIEW OF SYSTEMS:  All systems reviewed and negative except as noted in the HPI.  PHYSICAL EXAM:  GENERAL:  He is a pleasant 55 year old man somewhat unkempt  appearing but in no acute distress. VITAL SIGNS:  Blood pressure was 114/72, the pulse was 64 and regular, the respirations were 18, temperature is 98. HEENT:  Normal normocephalic and atraumatic.  Pupils equal and round. Oropharynx moist.  Sclerae anicteric. NECK:  No jugular venous distention.  No thyromegaly.  Trachea is midline.  Carotids are 2+ and symmetric. LUNGS:  Clear bilaterally to auscultation.  No wheezes, rales, or rhonchi are present. CARDIOVASCULAR:  Regular rate and rhythm.  Normal S1, S2.  Heart sounds are somewhat distant.  I do not appreciate any murmurs. ABDOMEN:  Obese, nontender, and nondistended.  There is no organomegaly. EXTREMITIES:  Demonstrated no cyanosis, clubbing, or edema.  The pulses are 2+ and symmetric. NEUROLOGIC: Alert and oriented x3. Cranial nerves are intact.  Strength was 5/5 and symmetric.  The ICD interrogation is as previously described.  Chest x-ray was reviewed.  IMPRESSION: 1. Recurrent episodes of ventricular tachycardia associated with     hemodynamic compromise. 2. Hypertrophic cardiomyopathy. 3. Complete heart block. 4. Status post BiV-ICD insertion.  DISCUSSION:  At this point, the patient has been on amiodarone with cessation of his ventricular arrhythmias.  We will switch him from intravenous to oral amiodarone.  We will of course need  to follow thyroid functions, chest x-rays, pulmonary functions, and liver functions while on amiodarone.  The patient spends lots of time in the sun, and I have encouraged him to not go without clothes on and do a plenty of sunscreen.  Finally, I have recommended the patient not be allowed to drive for 6 months.     Doylene Canning. Dakota Ridgel, MD     GWT/MEDQ  D:  07/22/2011  T:  07/22/2011  Job:  [id number]  Electronically Signed by Lewayne Bunting MD on 08/01/2011 12:20:14 PM

## 2011-08-02 LAB — GLUCOSE, CAPILLARY: Glucose-Capillary: 195 mg/dL — ABNORMAL HIGH (ref 70–99)

## 2011-08-02 LAB — BASIC METABOLIC PANEL
Calcium: 10 mg/dL (ref 8.4–10.5)
Creatinine, Ser: 1.66 mg/dL — ABNORMAL HIGH (ref 0.50–1.35)
GFR calc Af Amer: 52 mL/min — ABNORMAL LOW (ref >90)

## 2011-08-02 LAB — MAGNESIUM: Magnesium: 1.8 mg/dL (ref 1.5–2.5)

## 2011-08-03 ENCOUNTER — Encounter: Payer: Self-pay | Admitting: *Deleted

## 2011-08-08 NOTE — Op Note (Signed)
NAMEANTIONNE, Dakota NO.:  [id number]  MEDICAL RECORD NO.:  [id number]  LOCATION:  2927                         FACILITY:  [facility]  PHYSICIAN:  Doylene Canning. [name], MD    DATE OF BIRTH:  07-20-1956  DATE OF PROCEDURE:  07/28/2011 DATE OF DISCHARGE:                              OPERATIVE REPORT   PROCEDURE PERFORMED:  Electrophysiologic study and radiofrequency catheter ablation of ventricular tachycardia.  INTRODUCTION:  The patient is a 55 year old man with a nonischemic cardiomyopathy and chronic systolic heart failure.  He has developed recurrent ventricular tachycardia with multiple ICD shocks.  He was placed on amiodarone, and despite this he has had recurrent and fairly incessant ventricular tachycardia and is now referred for catheter ablation.  PROCEDURE:  After informed was obtained, the patient was taken to the diagnostic EP Lab in a fasting state.  After usual preparation and draping, intravenous fentanyl and midazolam was given for sedation.  A 6- French quadripolar catheter was inserted percutaneously in the right femoral vein and advanced to the His bundle region.  A 6-French quadripolar catheter was inserted percutaneously in the right femoral vein and advanced to the right ventricle.  Right rapid ventricular pacing was then carried out from the right ventricle as well as programed ventricular stimulation.  At a base drive cycle length of 119 msec, the S1-S2 interval stepwise decreased from 540 msec down to 260 msec where ventricular refractoriness was observed.  During programed ventricular stimulation, there were at least two different inducible ventricular tachycardias.  The two dominant ventricular tachycardias were right bundle-branch block VTs with right indeterminate axis. During ventricular tachycardia, the patient's VT was terminated spontaneously.  At this point, a 7-French quadripolar ablation catheter was then inserted percutaneously  into the right femoral artery and advanced retrograde across the aortic valve into the left ventricle. Prior to this, 7000 units of heparin was infused intravenously. Clotting times were then monitored during the procedure.  Initially, electroanatomical mapping was carried out with three-dimensional navigation using the Carto system.  A complex shell was created of the patient's endocardium of the left ventricle.  Following this, additional programed ventricular stimulation was carried out and activation mapping was demonstrated.  Two different ventricular tachycardias were demonstrated.  The faster one was found to be near the LV apex.  This had a rate of between 160-170 beats per minute.  The patient tolerated this less well hemodynamically, but it could be bumped and terminated or paced terminated.  The second VT which was slower and more easily tolerated appeared to be the patient is more dominant clinical VT and it was first targeted for catheter ablation.  This VT was found to be on the lateral wall of the left ventricle and towards the apex about two- thirds of the way from base to apex.  In the LAO projection, it was at approximately 3 o'clock.  Utilizing electroanatomical mapping, QS components were demonstrated and 6 RF deficit energy applications were delivered to the initial region.  Following this, programed ventricular stimulation was carried out demonstrating no inducible VT from this region.  However, there was remained inducible VT which was the faster VT, which was mapped to  the apical region as previously mentioned.  This VT was not as well tolerated hemodynamically.  However, because it would last for typically 20-30 seconds at a time, it was readily mapped.  Four RF energy applications were delivered to the second VT site.  Following ablation of both sides, programed ventricular stimulation was carried out and this demonstrated no inducible VT.  The patient was  observed for approximately 20 minutes and the catheters were removed and he was returned to the recovery area to have sheaths removed.  There were no immediate procedure complications.  COMPLICATIONS:  There are no immediate procedure complications.  RESULTS:  a.  Baseline ECG:  The baseline ECG demonstrates sinus rhythm with AV sequential pacing.  The QRS duration was 180 msec (paced).  The patient has underlying conduction disease and baseline complete heart block. b.  Baseline intervals:  The HV interval could not be titrated because of the patient's complete heart block. c.  Rapid ventricular pacing:  Rapid atrial pacing did not induce VT. There was VA dissociation at baseline. d.  Programed ventricular stimulation:  Programed ventricular stimulation was carried out from the right ventricle at an S1-S2 coupling interval of 600/540.  There was easily inducible VT prior to the ablation. e.  Rapid atrial pacing:  Rapid atrial pacing was not carried out as the patient had complete heart block at baseline. f.  Programed atrial stimulation:  Programed atrial stimulation was not carried out as the patient had complete heart block at baseline. g.  Arrhythmias observed: 1. Ventricular tachycardia initiation with programed ventricular     stimulation.  Duration was sustained.  Termination was spontaneous     and with catheter ablation.  4 h.  Mapping.  Mapping of the     patient's ventricular tachycardia was carried out with 3-D     electroanatomical mapping demonstrating two distinct VT foci.  The     first was near the LV apex.  The second was on the lateral wall of     the left ventricle two-third distance from base to apex.     a.     RF energy application:  A total of 8 RF energy applications      were delivered to the first and dominant VT.  The second VT had 4      RF energy applications applied.  CONCLUSION:  This study demonstrates successful electrophysiologic study and  catheter ablation of two different VT sites utilizing electroanatomical mapping.  There were no immediate procedure complications.     Doylene Canning. [name], MD     GWT/MEDQ  D:  08/03/2011  T:  08/03/2011  Job:  [id number]  cc:   Ernestina Penna, M.D.  Electronically Signed by Lewayne Bunting MD on 08/07/2011 11:57:02 PM

## 2011-08-08 NOTE — Discharge Summary (Signed)
[name], [name] NO.:  [id number]  MEDICAL RECORD NO.:  [id number]  LOCATION:  2927                         FACILITY:  [facility]  PHYSICIAN:  Doylene Canning. [name], MD    DATE OF BIRTH:  [birth year]  DATE OF ADMISSION:  07/26/2011 DATE OF DISCHARGE:  08/03/2011                              DISCHARGE SUMMARY   PRIMARY CARDIOLOGIST:  Rollene Rotunda, MD, [facility]  PRIMARY ELECTROPHYSIOLOGIST:  Doylene Canning. [name], MD  PRIVATE CARE PROVIDER:  Ernestina Penna, MD  DISCHARGE DIAGNOSIS:  Ventricular tachycardia storm.  SECONDARY DIAGNOSES: 1. Nonischemic cardiomyopathy, ejection fraction of 25-30%, status     post Medtronic biventricular ICD, May 2010. 2. Nonobstructive coronary artery disease by catheterization this     admission. 3. Diabetes mellitus. 4. Obesity. 5. Hypertrophic obstructive cardiomyopathy. 6. Hypertension. 7. Stage III chronic kidney disease. 8. Hyperlipidemia.  ALLERGIES:  NITROGLYCERIN.  PROCEDURES: 1. Left heart cardiac catheterization performed on July 27, 2011,     revealing moderate three-vessel nonobstructive CAD with elevated     right heart pressures including an RA of 14, RV 51/10 (20), PA     54/28 (38), PCWP of 24 with a cardiac output of 4.8 liters per     minute, and an index of 1.98 liters per minute per metered squared.     PA saturation was 59% while aortic saturation 94%. 2. Electrophysiologic study and radiofrequency catheter ablation of     ventricular tachycardia carried out on July 28, 2011,     demonstrating successful catheter ablation of 2 different VT sites     utilizing electro and anatomical mapping.  No immediate procedure     complications.  HISTORY OF PRESENT ILLNESS:  A 55 year old male with history of nonischemic cardiomyopathy and ventricular tachycardia, status post biventricular ICD in May 2010.  The patient was recently admitted on July 20, 2011 through July 24, 2011 for near syncope  and ventricular tachycardia.  During that admission, the patient was initiated on amiodarone therapy.  Unfortunately, on the morning of admission, the patient was awakened by palpitations followed by ICD firing.  Initially, his symptoms improved but he did develop recurrent palpitations, weakness, and presyncope that resolved spontaneously on 2 additional occasions, prompting him to present to his primary care provider.  While at his primary care provider's office, he had recurrent symptoms with defibrillator fire and EMS was called and the patient was taken to the [facility] ED.  En route, he was treated with amiodarone 150 mg IV bolus and did receive 2 additional ICD shocks, and he was subsequently given a bolus of IV lidocaine.  Upon presentation to [facility] ED, he was placed on IV amiodarone therapy and was admitted for further evaluation.  HOSPITAL COURSE:  Device interrogation confirmed recurrent ventricular tachycardia with rates of 130-150 beats per minute.  The patient's device was adjusted to change the detection rate to 150 beats per minute with use of antitachycardia pacing x4.  On admission, the patient was hypomagnesemic at 1.6 and he was appropriately supplemented.  On the IV amiodarone, the patient continued to have nonsustained ventricular tachycardia.  There were no additional ICD shocks.  Decision was made to pursue  diagnostic catheterizations to rule out possible ischemic source of the patient's VT.  Catheterization was performed on July 27, 2011 revealing moderate nonobstructive CAD with elevated right heart pressures.  Continued medical therapy was recommended, as well as EP evaluation.  The patient was evaluated by Electrophysiology on July 28, 2011 and after discussion, decision was made to pursue VT ablation.  The patient was agreeable and he was taken to the EP lab on the afternoon of July 28, 2011 for EP study, followed by successful radiofrequency  catheter ablation of ventricular tachycardia.  The patient tolerated this procedure well, and initially was maintained on IV amiodarone, but this was subsequently stopped in favor of p.o. amiodarone on July 29, 2011.  Unfortunately, the patient had recurrent presyncope and ventricular tachycardia with ICD discharges x6.  At this point, IV amiodarone was reinstituted and the patient was placed on oral mexiletine as well.  On the combination of amiodarone and mexiletine as well as beta-blocker therapy in the form of carvedilol, the patient had no further sustained VT or required additional ICD shocks, but did continue to have intermittent episodes of nonsustained ventricular tachycardia.  On August 01, 2011, IV amiodarone was converted to oral at 400 mg b.i.d., and he has tolerated this well without any persistent VT or symptomatic nonsustained VT.  At this point, the patient will be discharged home today in good condition on both amiodarone and mexiletine.  He was told follow up with Dr. [name] on August 12, 2011.  DISCHARGE LABS:  Hemoglobin 14.7, hematocrit 42.9, WBC 11.6, platelets 195.  Sodium 136, potassium 4.1, chloride 102, CO2 26, BUN 24, creatinine 1.66, glucose 95, calcium 10.0, CK 85, MB 4.6, troponin-I 0.52.  TSH 5.761, free T4 of 1.38, T3 of 2.7.  DISPOSITION:  The patient will be discharged home today in good condition.  FOLLOWUP PLANS AND APPOINTMENT:  The patient's followup with Dr. [name] is scheduled for August 12, 2011 at 2:30 p.m.  Follow up with Dr. Antoine Poche in our [place] office on September 01, 2011 at 11:45 a.m.  DISCHARGE MEDICATIONS: 1. Mexiletine 150 mg q.8 hours. 2. Potassium chloride 20 mEq daily. 3. Aspirin 81 mg daily. 4. Amiodarone 400 mg b.i.d. 5. Carvedilol 25 mg b.i.d. 6. Lisinopril 2.5 mg daily. 7. Actos 30 mg daily. 8. Fish oil 1 tab t.i.d. 9. Lasix 40 mg daily. 10.Glimepiride 4 mg b.i.d. 11.Januvia 100 mg daily. 12.Lantus 35 units  daily. 13.Metformin 500 mg 2 tabs b.i.d. 14.Trilipix 135 mg daily. 15.Vitamin D3 1000 units t.i.d. 16.Vytorin 10/40 mg 1 tab q.p.m. 17.Welchol 625 mg t.i.d.  OUTSTANDING LABORATORY STUDIES:  None.  DURATION OF DISCHARGE/ENCOUNTER:  Sixty minutes including physician time.     Nicolasa Ducking, ANP   ______________________________ Doylene Canning. [name], MD    CB/MEDQ  D:  08/03/2011  T:  08/04/2011  Job:  [id number]  cc:   Ernestina Penna, M.D.  Electronically Signed by Nicolasa Ducking ANP on 08/07/2011 02:02:42 PM Electronically Signed by Lewayne Bunting MD on 08/07/2011 11:57:08 PM

## 2011-08-10 ENCOUNTER — Encounter: Payer: Self-pay | Admitting: General Surgery

## 2011-08-11 NOTE — H&P (Signed)
NAMECHRISTIE, COPLEY NO.:  [id number]  MEDICAL RECORD NO.:  [id number]  LOCATION:  2905                         FACILITY:  [facility]  PHYSICIAN:  Rollene Rotunda, MD, FACCDATE OF BIRTH:  [birth year]  DATE OF ADMISSION:  07/26/2011 DATE OF DISCHARGE:                             HISTORY & PHYSICAL   PRIMARY CARE PHYSICIAN:  Ernestina Penna, MD  PRIMARY CARDIOLOGIST:  Rollene Rotunda, MD, [facility]  ELECTROPHYSIOLOGIST:  Doylene Canning. [name], MD  CHIEF COMPLAINT:  VT storm.  HISTORY OF PRESENT ILLNESS:  Mr. Bivins is a 55 year old male with a history of nonischemic cardiomyopathy and ventricular tachycardia who was recently hospitalized for this and discharged on July 24, 2011. He was okay yesterday except for 1 episode of presyncope and weakness with minimal activity.  He did not have chest pain or palpitations.  His symptoms resolved. Today, Mr. Sackmann was wakened by palpitations at about 4 am.  His defibrillator fired x1.  His symptoms improved, but he had a total of 2 episodes about an hour apart of weakness, presyncope and palpitations. His defibrillator did not fire during these.  They concerned him, so he went to [facility].  He developed palpitations as well as weakness and presyncope, chest pain, and shortness of breath. His defibrillator fired.  EMS was summoned and after their arrival, his defibrillator fired again.  The chest pain was 5/10.  It was associated with shortness of breath but no nausea, vomiting, or diaphoresis.  After his defibrillator fired, his chest pain resolved.  He received amiodarone 150 mg and was transported to [facility].  En route, he was shocked twice.  Because of the recurrent VT, he received lidocaine 100 mg IV.  In the emergency room, IV amiodarone has been ordered. Currently, he is not experiencing any chest pain or shortness of breath.  PAST MEDICAL HISTORY: 1. History of nonischemic cardiomyopathy with  echocardiogram July 23, 2011, showing an EF of 25-30% with diffuse hypokinesis and     akinesis of the apical myocardium, grade 1 diastolic dysfunction. 2. Admission for VT and near-syncope from July 20, 2011, through     July 24, 2011. 3. History of complete heart block status post permanent pacemaker,     which was upgraded to Medtronic Concerto II BiV ICD in May 2010. 4. Status post cardiac catheterization in April 2010, showing LAD 60     and 70, circumflex 50 and 70, RCA 40%, medical therapy recommended. 5. Diabetes. 6. Obesity. 7. Hypertrophic obstructive cardiomyopathy. 8. Hypertension. 9. Chronic kidney disease, stage III with a BUN of 18, creatinine     1.41, and GFR of 55 at discharge. 10.Hyperlipidemia.  SURGICAL HISTORY:  He is status post pacemaker and catheterization as well as upgrade to a biventricular device and facial surgery after trauma in 2006.  ALLERGIES:  He is intolerant of NITROGLYCERIN.  CURRENT MEDICATIONS: 1. Actos 30 mg a day. 2. Amiodarone 200 mg 2 tablets b.i.d. 3. Aspirin 325 mg a day. 4. Carvedilol 25 mg b.i.d. 5. Fish oil t.i.d. 6. Lasix 40 mg a day. 7. Amaryl 4 mg b.i.d. 8. Januvia 100 mg a day. 9. Lantus  35 units q.a.m. 10.Lisinopril 2.5 mg a day. 11.Metformin 500 mg 2 tabs b.i.d. 12.Trilipix 135 mg a day. 13.Vitamin D3 1000 units t.i.d. 14.Vytorin 10/40 daily. 15.Welchol 625 mg t.i.d.  SOCIAL HISTORY:  He lives in [place] with his wife.  He was previously a Curator.  He has ongoing tobacco use with approximately 40 pack year history, but has significantly cut back.  He denies alcohol or drug abuse.  FAMILY HISTORY:  His mother's medical history is unknown.  His father has no coronary artery disease and no siblings are known to have coronary artery disease.  REVIEW OF SYSTEMS:  He has not had fevers, chills, or sweats.  He rarely wheezes and has a cough daily with occasional pale yellow sputum.  He has not had any  recent edema.  Palpitations and chest pain are described above.  He has some chronic dyspnea on exertion that has not changed recently.  He denies any reflux symptoms or melena.  Full 14-point review of systems is otherwise negative except as stated in the HPI.  PHYSICAL EXAMINATION:  VITAL SIGNS:  Temperature is 97.4, blood pressure 120/65, respiratory rate 18, O2 saturation 98% on 1.5 L/minute. GENERAL:  He is a well-developed, overweight white male, in no acute distress. HEENT:  Normal. NECK:  There is no lymphadenopathy, thyromegaly, bruit, or JVD noted. CV:  His heart is regular in rate and rhythm with an S1, S2 and no significant murmur, rub, or gallop is noted.  Distal pulses are intact in all 4 extremities. LUNGS:  He has no crackles and only a few rales in the bases. SKIN:  No rashes or lesions are noted. ABDOMEN:  Soft and nontender with active bowel sounds and no hepatosplenomegaly is noted. EXTREMITIES:  There is no cyanosis, clubbing, or edema noted. MUSCULOSKELETAL:  There is no joint deformity or effusions and no spine or CVA tenderness. NEURO:  He is alert and oriented.  Cranial nerves II-XII grossly intact.  IMAGING:  Chest x-ray shows cardiomegaly and pulmonary vascular cephalization without frank edema.  EKG shows AV pacing.  LABORATORY VALUES:  Hemoglobin 16.7, hematocrit 49.  Sodium 137, potassium 5.0, chloride 108, CO2 36, BUN 36, creatinine 1.59, glucose 205, magnesium 1.6, calcium 10.7, INR 1.04.  On July 23, 2011, TSH was elevated at 5.437, and hemoglobin A1c was 8.1.  IMPRESSION:  Mr. Salido was seen today by Dr. Antoine Poche, the patient evaluated and the data reviewed.  He has recurrent ventricular tachycardia despite being started on amiodarone last week.  His ejection fraction is down slightly from previous echocardiogram.  Also, today, he had chest pain and some shortness of breath.  He had multiple implantable cardioverter defibrillator discharges  today and prolonged ventricular tachycardia.  Ventricular tachycardia:  He needs a right and left heart catheterization.  If there are changes in his coronary artery disease, probable longer inpatient low-dose amiodarone is needed before discharge.  We will ask Dr. [name] to see him in am.     Theodore Demark, PA-C   ______________________________ Rollene Rotunda, MD, [facility]    RB/MEDQ  D:  07/26/2011  T:  07/27/2011  Job:  [id number]  Electronically Signed by Theodore Demark PA-C on 08/07/2011 09:49:55 PM Electronically Signed by Rollene Rotunda MD [facility] on 08/11/2011 05:07:58 PM

## 2011-08-12 ENCOUNTER — Encounter: Payer: Self-pay | Admitting: Internal Medicine

## 2011-08-12 ENCOUNTER — Ambulatory Visit (INDEPENDENT_AMBULATORY_CARE_PROVIDER_SITE_OTHER): Payer: BC Managed Care – PPO | Admitting: Internal Medicine

## 2011-08-12 DIAGNOSIS — I472 Ventricular tachycardia, unspecified: Secondary | ICD-10-CM | POA: Insufficient documentation

## 2011-08-12 DIAGNOSIS — I5022 Chronic systolic (congestive) heart failure: Secondary | ICD-10-CM

## 2011-08-12 DIAGNOSIS — Z95810 Presence of automatic (implantable) cardiac defibrillator: Secondary | ICD-10-CM

## 2011-08-12 DIAGNOSIS — I442 Atrioventricular block, complete: Secondary | ICD-10-CM

## 2011-08-12 LAB — BASIC METABOLIC PANEL
BUN: 34 mg/dL — ABNORMAL HIGH (ref 6–23)
CO2: 24 mEq/L (ref 19–32)
Chloride: 105 mEq/L (ref 96–112)
Creatinine, Ser: 1.7 mg/dL — ABNORMAL HIGH (ref 0.4–1.5)

## 2011-08-12 MED ORDER — AMIODARONE HCL 400 MG PO TABS: ORAL_TABLET | ORAL | Status: DC

## 2011-08-12 MED ORDER — MEXILETINE HCL 150 MG PO CAPS: 150.0000 mg | ORAL_CAPSULE | Freq: Three times a day (TID) | ORAL | Status: DC

## 2011-08-12 NOTE — Assessment & Plan Note (Signed)
His device is working normally. We'll plan a recheck in several months. 

## 2011-08-12 NOTE — Assessment & Plan Note (Addendum)
His symptoms are improved but his tachycardia continues. He has had multiple successful anti-tachycardic pacing therapies since discharge from the hospital. He is already undergone catheter ablation. I would hold a repeat procedure for some time later should he have worsening arrhythmias. We will continue amiodarone 400 bid for one month then reduce to 600 daily.

## 2011-08-12 NOTE — Patient Instructions (Addendum)
Your physician recommends that you have lab work today: bmp (428.22l; 427.1)  Your physician has recommended you make the following change in your medication:  1) Continue amiodarone 400mg  one tablet by mouth twice daily for 1 month, then 2) Decrease amiodarone to 400mg  one tablet in the morning and 1/2 tablet in the evening.  Your physician recommends that you schedule a follow-up appointment in: 3 months.

## 2011-08-12 NOTE — Progress Notes (Signed)
Addended by: Sherri Rad C on: 08/12/2011 03:36 PM   Modules accepted: Orders

## 2011-08-12 NOTE — Progress Notes (Signed)
HPI [name] returns today for followup. He is a 55 year old man with a nonischemic cardiomyopathy, hypertrophic cardiomyopathy, and recurrent ventricular tachycardia. He underwent admission to the hospital several weeks ago with incessant VT and was treated with intravenous amiodarone followed by catheter ablation. During his ablation. He underwent ablation which appeared to be successful and that his VT was rendered noninducible. After ablation however he had recurrent ventricular tachycardia. The patient has undergone catheter ablation of his VT several weeks ago. Postprocedure, the patient had recurrent ventricular tachycardia. Since then he is in placed on amiodarone. He denies chest pain and denies shortness of breath. He complains of difficulty with sleeping and some tingling in his legs and feet at nighttime. No specific complaints. Allergies  Allergen Reactions  . Ace Inhibitors     AVOID ALL  BECAUSE OF HEART DISEASE   . Mexitil     Pt has heart disease called HYPERTHROPHIC CARDIOMYOPATHY  . Nitroglycerin   . Procan Sr (Procainamide Hcl)     PT. HAS HEART DISEASE CALLED HYPERTHROPHIC CARDIOMYOPATHY  . Quinidine     PT. HAS HEART DISEASE CALLED HYPERTHROPHIC CARDIOMYOPATHY       Current Outpatient Prescriptions  Medication Sig Dispense Refill  . aspirin 325 MG tablet Take 325 mg by mouth daily.        . carvedilol (COREG) 25 MG tablet TAKE 1 TABLET TWICE A DAY  180 tablet  2  . Cholecalciferol (VITAMIN D3) 1000 UNITS CAPS Take by mouth daily.        . Choline Fenofibrate (TRILIPIX) 135 MG capsule Take 135 mg by mouth daily.        . colesevelam (WELCHOL) 625 MG tablet Take 1,875 mg by mouth daily.        [name] ezetimibe-simvastatin (VYTORIN) 10-40 MG per tablet Take 1 tablet by mouth at bedtime.        . fish oil-omega-3 fatty acids 1000 MG capsule Take 3 capsules by mouth daily.        . furosemide (LASIX) 40 MG tablet Take 40 mg by mouth daily.        [name] glimepiride (AMARYL) 4 MG  tablet Take 4 mg by mouth 2 (two) times daily.        . insulin glargine (LANTUS) 100 UNIT/ML injection Inject into the skin as directed.        [name] lisinopril (PRINIVIL,ZESTRIL) 2.5 MG tablet Take 1 tablet (2.5 mg total) by mouth daily.  90 tablet  3  . metFORMIN (GLUMETZA) 500 MG (MOD) 24 hr tablet Take 1,000 mg by mouth 2 (two) times daily with a meal.       . mexiletine (MEXITIL) 150 MG capsule Take 150 mg by mouth every 8 (eight) hours.        . pioglitazone (ACTOS) 30 MG tablet Take 30 mg by mouth daily.        . potassium chloride SA (K-DUR,KLOR-CON) 20 MEQ tablet Take 20 mEq by mouth daily.        . sitaGLIPtan (JANUVIA) 100 MG tablet Take 100 mg by mouth daily.        . Vitamin D, Ergocalciferol, (DRISDOL) 50000 UNITS CAPS          Past Medical History  Diagnosis Date  . DM   . HYPERLIPIDEMIA-MIXED   . Obesity, unspecified   . HYPERTENSION, UNSPECIFIED   . MYOCARDIAL INFARCTION   . Hypertrophic obstructive cardiomyopathy     Poor acoustic windows on echo 2010  . CARDIOMYOPATHY, SECONDARY   .  AV BLOCK, COMPLETE   . RENAL INSUFFICIENCY   . AUTOMATIC IMPLANTABLE CARDIAC DEFIBRILLATOR SITU   . Blockage of coronary artery of heart   . BPH (benign prostatic hypertrophy)   . Colon polyps     ROS:   All systems reviewed and negative except as noted in the HPI.   Past Surgical History  Procedure Date  . Pacemaker insertion     medtronic  . Cardiac catheterization   . Facial reconstruction surgery     after trauma in 2006     Family History  Problem Relation Age of Onset  . Diabetic kidney disease Father   . Coronary artery disease Neg Hx      History   Social History  . Marital Status: Married    Spouse Name: N/A    Number of Children: N/A  . Years of Education: N/A   Occupational History  . Not on file.   Social History Main Topics  . Smoking status: Former Smoker    Quit date: 10/04/1968  . Smokeless tobacco: Not on file  . Alcohol Use: Yes      rarely  . Drug Use: Not on file  . Sexually Active: Not on file   Other Topics Concern  . Not on file   Social History Narrative  . No narrative on file     BP 124/76  Pulse 76  Ht 6\' 1"  (1.854 m)  Wt 266 lb 1.9 oz (120.711 kg)  BMI 35.11 kg/m2  Physical Exam:  Well appearing NAD HEENT: Unremarkable Neck:  No JVD, no thyromegally Lymphatics:  No adenopathy Back:  No CVA tenderness Lungs:  Clear no wheezes, rales, or rhonchi. Well-healed ICD incision. HEART:  Regular rate rhythm, no murmurs, no rubs, no clicks Abd:  soft, positive bowel sounds, no organomegally, no rebound, no guarding Ext:  2 plus pulses, no edema, no cyanosis, no clubbing Skin:  No rashes no nodules Neuro:  CN II through XII intact, motor grossly intact  DEVICE  Normal device function.  See PaceArt for details.   Assess/Plan:

## 2011-09-01 ENCOUNTER — Encounter: Payer: Self-pay | Admitting: Cardiology

## 2011-09-01 ENCOUNTER — Ambulatory Visit (INDEPENDENT_AMBULATORY_CARE_PROVIDER_SITE_OTHER): Payer: BC Managed Care – PPO | Admitting: Cardiology

## 2011-09-01 DIAGNOSIS — Z95810 Presence of automatic (implantable) cardiac defibrillator: Secondary | ICD-10-CM

## 2011-09-01 DIAGNOSIS — E669 Obesity, unspecified: Secondary | ICD-10-CM

## 2011-09-01 DIAGNOSIS — I10 Essential (primary) hypertension: Secondary | ICD-10-CM

## 2011-09-01 DIAGNOSIS — I429 Cardiomyopathy, unspecified: Secondary | ICD-10-CM

## 2011-09-01 DIAGNOSIS — I5022 Chronic systolic (congestive) heart failure: Secondary | ICD-10-CM

## 2011-09-01 MED ORDER — LISINOPRIL 2.5 MG PO TABS: 2.5000 mg | ORAL_TABLET | Freq: Two times a day (BID) | ORAL | Status: DC

## 2011-09-01 MED ORDER — POTASSIUM CHLORIDE CRYS ER 20 MEQ PO TBCR: 20.0000 meq | EXTENDED_RELEASE_TABLET | Freq: Every day | ORAL | Status: DC

## 2011-09-01 NOTE — Patient Instructions (Addendum)
Please have blood work drawn in 7 days(BNP, BMP and TSH)  Please increase your Lisinopril to twice a day Continue all other medications as listed

## 2011-09-01 NOTE — Assessment & Plan Note (Addendum)
I will check a BNP level to try to assess whether his dyspnea is related to his cardiomyopathy. He seems to be euvolemic at this point by exam. His EF was 25% on recent echo.  I will try to increase the lisinopril to 2.5 mg bid.  I will need to watch his creatinine closely. It was 1.7 earlier this month. I will check it again in 7 days.

## 2011-09-01 NOTE — Assessment & Plan Note (Signed)
He understands the need to lose weight with diet and exercise. 

## 2011-09-01 NOTE — Assessment & Plan Note (Signed)
His blood pressure is controlled on the medications as listed. I will make no change to this regimen at this point.

## 2011-09-01 NOTE — Assessment & Plan Note (Signed)
He will remain on a slow amiodarone load. He'll follow Dr. [name].

## 2011-09-01 NOTE — Progress Notes (Signed)
HPI [name] presents for followup of his cardiomyopathy and recent v tach.  [name] was hospitalized with recurrent tachycardia and multiple defibrillator firings.  [name] has undergone ablation therapy and is currently on amiodarone. [name] has continued to have runs of ventricular tachycardia with antitachycardia pacing.  [name] did have a cardiac catheterization demonstrating nonobstructive disease as described below.  [name] has seen Dr. [name] since [name] was discharged. [name]'s on a very slow taper of his amiodarone. [name]'s had continued palpitations. [name] will notice his heart beating fast and get somewhat lightheaded. [name] will stop what [name] is doing and his symptoms will abate. [name]'s had no frank syncope or presyncope. [name] has had no chest pain but [name] does get dyspnea with activity such as walking 25 yards on level ground.  [name] denies PND or orthopnea. [name]'s had no weight gain or edema.  Allergies  Allergen Reactions  . Ace Inhibitors     AVOID ALL  BECAUSE OF HEART DISEASE   . Mexitil     Pt has heart disease called HYPERTHROPHIC CARDIOMYOPATHY  . Nitroglycerin   . Procan Sr (Procainamide Hcl)     PT. HAS HEART DISEASE CALLED HYPERTHROPHIC CARDIOMYOPATHY  . Quinidine     PT. HAS HEART DISEASE CALLED HYPERTHROPHIC CARDIOMYOPATHY      Current Outpatient Prescriptions  Medication Sig Dispense Refill  . amiodarone (PACERONE) 200 MG tablet 2 tabs in Dakota am and 2 tabs in Dakota pm       . aspirin 325 MG tablet Take 325 mg by mouth daily.        . carvedilol (COREG) 25 MG tablet TAKE 1 TABLET TWICE A DAY  180 tablet  2  . Cholecalciferol (VITAMIN D3) 1000 UNITS CAPS Take by mouth daily.        . Choline Fenofibrate (TRILIPIX) 135 MG capsule Take 135 mg by mouth daily.        . colesevelam (WELCHOL) 625 MG tablet Take 1,875 mg by mouth daily.        [name] ezetimibe-simvastatin (VYTORIN) 10-40 MG per tablet Take 1 tablet by mouth at bedtime.        . fish oil-omega-3 fatty acids 1000 MG capsule Take 3 capsules by mouth  daily.        . furosemide (LASIX) 40 MG tablet Take 40 mg by mouth daily.        [name] glimepiride (AMARYL) 4 MG tablet Take 4 mg by mouth 2 (two) times daily.        . insulin glargine (LANTUS) 100 UNIT/ML injection Inject into Dakota skin as directed.        [name] lisinopril (PRINIVIL,ZESTRIL) 2.5 MG tablet Take 1 tablet (2.5 mg total) by mouth daily.  90 tablet  3  . metFORMIN (GLUMETZA) 500 MG (MOD) 24 hr tablet Take 1,000 mg by mouth 2 (two) times daily with a meal.       . mexiletine (MEXITIL) 150 MG capsule Take 1 capsule (150 mg total) by mouth every 8 (eight) hours.  270 capsule  3  . pioglitazone (ACTOS) 30 MG tablet Take 30 mg by mouth daily.        . potassium chloride SA (K-DUR,KLOR-CON) 20 MEQ tablet Take 20 mEq by mouth daily.        . sitaGLIPtan (JANUVIA) 100 MG tablet Take 100 mg by mouth daily.        . Vitamin D, Ergocalciferol, (DRISDOL) 50000 UNITS CAPS         Past  Medical History  Diagnosis Date  . DM   . HYPERLIPIDEMIA-MIXED   . Obesity, unspecified   . HYPERTENSION, UNSPECIFIED   . MYOCARDIAL INFARCTION     LAD 50% to 60% stenosis, OM 150% stenosis, OM 240% stenosis, right coronary artery 40% stenosis, 11/12  . Hypertrophic obstructive cardiomyopathy     Poor acoustic windows on echo 2010  . CARDIOMYOPATHY, SECONDARY   . AV BLOCK, COMPLETE   . RENAL INSUFFICIENCY   . AUTOMATIC IMPLANTABLE CARDIAC DEFIBRILLATOR SITU   . Blockage of coronary artery of heart   . BPH (benign prostatic hypertrophy)   . Colon polyps   . Ventricular tachycardia     Past Surgical History  Procedure Date  . Pacemaker insertion     medtronic  . Cardiac catheterization   . Facial reconstruction surgery     after trauma in 2006    ROS:  As stated in Dakota HPI and negative for all other systems.  PHYSICAL EXAM BP 111/71  Pulse 81  Ht 6' (1.829 m)  Wt 273 lb (123.832 kg)  BMI 37.03 kg/m2 GENERAL:  Well appearing HEENT:  Pupils equal round and reactive, fundi not visualized, oral  mucosa unremarkable, edentulous NECK:  No jugular venous distention, waveform within normal limits, carotid upstroke brisk and symmetric, no bruits, no thyromegaly LYMPHATICS:  No cervical, inguinal adenopathy LUNGS:  Clear to auscultation bilaterally BACK:  No CVA tenderness CHEST:  Well healed device pocket HEART:  PMI not displaced or sustained,S1 and S2 within normal limits, no S3, no S4, no clicks, no rubs, no murmurs ABD:  Flat, positive bowel sounds normal in frequency in pitch, no bruits, no rebound, no guarding, no midline pulsatile mass, no hepatomegaly, no splenomegaly, obese EXT:  2 plus pulses throughout, trace edema, no cyanosis no clubbing SKIN:  No rashes no nodules NEURO:  Cranial nerves II through XII grossly intact, motor grossly intact throughout PSYCH:  Cognitively intact, oriented to person place and time  EKG:  Sinus with paced ventricular rhythm  ASSESSMENT AND PLAN

## 2011-09-09 ENCOUNTER — Encounter: Payer: Self-pay | Admitting: Internal Medicine

## 2011-10-13 ENCOUNTER — Ambulatory Visit (INDEPENDENT_AMBULATORY_CARE_PROVIDER_SITE_OTHER): Payer: BC Managed Care – PPO | Admitting: Cardiology

## 2011-10-13 ENCOUNTER — Encounter: Payer: Self-pay | Admitting: Cardiology

## 2011-10-13 DIAGNOSIS — I5022 Chronic systolic (congestive) heart failure: Secondary | ICD-10-CM

## 2011-10-13 DIAGNOSIS — E669 Obesity, unspecified: Secondary | ICD-10-CM

## 2011-10-13 DIAGNOSIS — I472 Ventricular tachycardia, unspecified: Secondary | ICD-10-CM

## 2011-10-13 MED ORDER — LISINOPRIL 2.5 MG PO TABS: 2.5000 mg | ORAL_TABLET | Freq: Two times a day (BID) | ORAL | Status: DC

## 2011-10-13 MED ORDER — POTASSIUM CHLORIDE CRYS ER 20 MEQ PO TBCR: 20.0000 meq | EXTENDED_RELEASE_TABLET | Freq: Every day | ORAL | Status: DC

## 2011-10-13 NOTE — Assessment & Plan Note (Signed)
The patient has had no more firings of his defibrillator or tachycardia palpitations. He will continue on therapy as listed.

## 2011-10-13 NOTE — Assessment & Plan Note (Signed)
The patient understands the need to lose weight with diet and exercise. We have discussed specific strategies for this.  

## 2011-10-13 NOTE — Assessment & Plan Note (Signed)
He seems to be euvolemic.  At this point, no change in therapy is indicated.  We have reviewed salt and fluid restrictions.  No further cardiovascular testing is indicated.   

## 2011-10-13 NOTE — Patient Instructions (Signed)
The current medical regimen is effective;  continue present plan and medications.  Follow up in 4 months with Dr Hochrein.  You will receive a letter in the mail 2 months before you are due.  Please call us when you receive this letter to schedule your follow up appointment.  

## 2011-10-13 NOTE — Progress Notes (Signed)
HPI The patient presents for followup of his cardiomyopathy and recent v tach.  He was hospitalized with recurrent tachycardia and multiple defibrillator firings.  He has undergone ablation therapy and is currently on amiodarone. He has continued to have runs of ventricular tachycardia with antitachycardia pacing.  He did have a cardiac catheterization demonstrating nonobstructive disease as described below.  He has seen Dr. [name] since he was discharged. He's on a very slow taper of his amiodarone. He's had continued palpitations. He will notice his heart beating fast and get somewhat lightheaded. He will stop what he is doing and his symptoms will abate. He's had no frank syncope or presyncope. He has had no chest pain but he does get dyspnea with activity such as walking 25 yards on level ground.  He denies PND or orthopnea. He's had no weight gain or edema.  Allergies  Allergen Reactions  . Ace Inhibitors     AVOID ALL  BECAUSE OF HEART DISEASE   . Mexitil     Pt has heart disease called HYPERTHROPHIC CARDIOMYOPATHY  . Nitroglycerin   . Procan Sr (Procainamide Hcl)     PT. HAS HEART DISEASE CALLED HYPERTHROPHIC CARDIOMYOPATHY  . Quinidine     PT. HAS HEART DISEASE CALLED HYPERTHROPHIC CARDIOMYOPATHY      Current Outpatient Prescriptions  Medication Sig Dispense Refill  . amiodarone (PACERONE) 200 MG tablet 1 tab in the am and 1/2 tab in the pm      . aspirin 325 MG tablet Take 325 mg by mouth daily.        . carvedilol (COREG) 25 MG tablet TAKE 1 TABLET TWICE A DAY  180 tablet  2  . Cholecalciferol (VITAMIN D3) 1000 UNITS CAPS Take by mouth daily.        . Choline Fenofibrate (TRILIPIX) 135 MG capsule Take 135 mg by mouth daily.        . colesevelam (WELCHOL) 625 MG tablet Take 1,875 mg by mouth daily.        [name] ezetimibe-simvastatin (VYTORIN) 10-40 MG per tablet Take 1 tablet by mouth at bedtime.        . fish oil-omega-3 fatty acids 1000 MG capsule Take 3 capsules by mouth  daily.        . furosemide (LASIX) 40 MG tablet Take 40 mg by mouth daily.        [name] glimepiride (AMARYL) 4 MG tablet Take 4 mg by mouth 2 (two) times daily.        . insulin glargine (LANTUS) 100 UNIT/ML injection Inject into the skin as directed.        [name] lisinopril (PRINIVIL,ZESTRIL) 2.5 MG tablet Take 1 tablet (2.5 mg total) by mouth 2 (two) times daily.  270 tablet  3  . metFORMIN (GLUMETZA) 500 MG (MOD) 24 hr tablet Take 1,000 mg by mouth 2 (two) times daily with a meal.       . mexiletine (MEXITIL) 150 MG capsule Take 1 capsule (150 mg total) by mouth every 8 (eight) hours.  270 capsule  3  . pioglitazone (ACTOS) 30 MG tablet Take 30 mg by mouth daily.        . potassium chloride SA (K-DUR,KLOR-CON) 20 MEQ tablet Take 1 tablet (20 mEq total) by mouth daily.  90 tablet  3  . sitaGLIPtan (JANUVIA) 100 MG tablet Take 100 mg by mouth daily.        . Vitamin D, Ergocalciferol, (DRISDOL) 50000 UNITS CAPS  Past Medical History  Diagnosis Date  . DM   . HYPERLIPIDEMIA-MIXED   . Obesity, unspecified   . HYPERTENSION, UNSPECIFIED   . MYOCARDIAL INFARCTION     LAD 50% to 60% stenosis, OM 150% stenosis, OM 240% stenosis, right coronary artery 40% stenosis, 11/12  . Hypertrophic obstructive cardiomyopathy     Poor acoustic windows on echo 2010  . CARDIOMYOPATHY, SECONDARY   . AV BLOCK, COMPLETE   . RENAL INSUFFICIENCY   . AUTOMATIC IMPLANTABLE CARDIAC DEFIBRILLATOR SITU   . Blockage of coronary artery of heart   . BPH (benign prostatic hypertrophy)   . Colon polyps   . Ventricular tachycardia     Past Surgical History  Procedure Date  . Pacemaker insertion     medtronic  . Cardiac catheterization   . Facial reconstruction surgery     after trauma in 2006    ROS:  As stated in the HPI and negative for all other systems.  PHYSICAL EXAM BP 105/67  Pulse 70  Ht 6\' 1"  (1.854 m)  Wt 285 lb (129.275 kg)  BMI 37.60 kg/m2 GENERAL:  Well appearing HEENT:  Pupils equal round  and reactive, fundi not visualized, oral mucosa unremarkable, edentulous NECK:  No jugular venous distention, waveform within normal limits, carotid upstroke brisk and symmetric, no bruits, no thyromegaly LYMPHATICS:  No cervical, inguinal adenopathy LUNGS:  Clear to auscultation bilaterally BACK:  No CVA tenderness CHEST:  Well healed device pocket HEART:  PMI not displaced or sustained,S1 and S2 within normal limits, no S3, no S4, no clicks, no rubs, no murmurs ABD:  Flat, positive bowel sounds normal in frequency in pitch, no bruits, no rebound, no guarding, no midline pulsatile mass, no hepatomegaly, no splenomegaly, obese EXT:  2 plus pulses throughout, trace edema, no cyanosis no clubbing SKIN:  No rashes no nodules NEURO:  Cranial nerves II through XII grossly intact, motor grossly intact throughout PSYCH:  Cognitively intact, oriented to person place and time  EKG:  Sinus with paced ventricular rhythm  ASSESSMENT AND PLAN

## 2011-10-21 ENCOUNTER — Encounter: Payer: BC Managed Care – PPO | Admitting: *Deleted

## 2011-10-28 ENCOUNTER — Telehealth: Payer: Self-pay | Admitting: *Deleted

## 2011-10-28 NOTE — Telephone Encounter (Signed)
Spoke with pt who is aware of instructions

## 2011-10-28 NOTE — Telephone Encounter (Signed)
Message copied by Sharin Grave on Thu Oct 28, 2011  7:00 PM ------      Message from: Rollene Rotunda      Created: Tue Oct 26, 2011  1:45 PM       Please make sure that he tapers his amio as Sharlot Gowda writes.      ----- Message -----         From: Lewayne Bunting, MD         Sent: 10/26/2011  11:56 AM           To: Rollene Rotunda, MD            Leta Jungling, he should be on 600 mg daily until the end of February then reduce to 400 mg daily beginning March 1.      ----- Message -----         From: Rollene Rotunda, MD         Sent: 10/13/2011  10:59 AM           To: Lewayne Bunting, MD, Marily Lente, RN            Sharlot Gowda,  How much amio do you think should be his maintenance dose?

## 2011-11-09 ENCOUNTER — Ambulatory Visit (INDEPENDENT_AMBULATORY_CARE_PROVIDER_SITE_OTHER): Payer: BC Managed Care – PPO | Admitting: Internal Medicine

## 2011-11-09 ENCOUNTER — Encounter: Payer: Self-pay | Admitting: Internal Medicine

## 2011-11-09 VITALS — BP 102/68 | HR 77 | Ht 73.0 in | Wt 288.8 lb

## 2011-11-09 DIAGNOSIS — I509 Heart failure, unspecified: Secondary | ICD-10-CM

## 2011-11-09 DIAGNOSIS — I472 Ventricular tachycardia: Secondary | ICD-10-CM

## 2011-11-09 DIAGNOSIS — Z95810 Presence of automatic (implantable) cardiac defibrillator: Secondary | ICD-10-CM

## 2011-11-09 DIAGNOSIS — I5022 Chronic systolic (congestive) heart failure: Secondary | ICD-10-CM

## 2011-11-09 DIAGNOSIS — I428 Other cardiomyopathies: Secondary | ICD-10-CM

## 2011-11-09 LAB — ICD DEVICE OBSERVATION
AL IMPEDENCE ICD: 475 Ohm
ATRIAL PACING ICD: 99.38 pct
BAMS-0001: 170 {beats}/min
CHARGE TIME: 7.207 s
TOT-0006: 20100521000000
TZAT-0001FASTVT: 2
TZAT-0001SLOWVT: 1
TZAT-0001SLOWVT: 2
TZAT-0002ATACH: NEGATIVE
TZAT-0002ATACH: NEGATIVE
TZAT-0004FASTVT: 8
TZAT-0005FASTVT: 88 pct
TZAT-0005SLOWVT: 88 pct
TZAT-0005SLOWVT: 91 pct
TZAT-0011FASTVT: 10 ms
TZAT-0011SLOWVT: 10 ms
TZAT-0012FASTVT: 200 ms
TZAT-0013FASTVT: 2
TZAT-0013SLOWVT: 6
TZAT-0013SLOWVT: 6
TZAT-0018ATACH: NEGATIVE
TZAT-0018FASTVT: NEGATIVE
TZAT-0018SLOWVT: NEGATIVE
TZAT-0018SLOWVT: NEGATIVE
TZAT-0019ATACH: 6 V
TZAT-0019ATACH: 6 V
TZAT-0019FASTVT: 8 V
TZAT-0020ATACH: 1.5 ms
TZAT-0020FASTVT: 1.5 ms
TZON-0003FASTVT: 350 ms
TZON-0004SLOWVT: 52
TZON-0005SLOWVT: 12
TZST-0001ATACH: 5
TZST-0001FASTVT: 3
TZST-0001FASTVT: 6
TZST-0001SLOWVT: 3
TZST-0001SLOWVT: 5
TZST-0002ATACH: NEGATIVE
TZST-0002ATACH: NEGATIVE
TZST-0002SLOWVT: NEGATIVE
TZST-0002SLOWVT: NEGATIVE
TZST-0003FASTVT: 35 J
TZST-0003FASTVT: 35 J
VENTRICULAR PACING ICD: 99.9 pct
VF: 0

## 2011-11-09 NOTE — Progress Notes (Signed)
HPI [name] is today for followup. He is a very pleasant 56 year old man with a history of hypertrophic cardiomyopathy and dilated cardiomyopathy. He has complete heart block. He has ventricular tachycardia. He underwent catheter ablation of his ventricular tachycardia several months ago. Early after ablation, the patient had recurrent VT despite being rendered noninducible at the time of his procedure. He has been on 600 mg of amiodarone daily. He has had no recurrent VT. He complains of feeling weak. He admits to dietary indiscretion with sodium. He denies peripheral edema or cough. No syncope. Allergies  Allergen Reactions  . Ace Inhibitors     AVOID ALL  BECAUSE OF HEART DISEASE   . Mexitil     Pt has heart disease called HYPERTHROPHIC CARDIOMYOPATHY  . Nitroglycerin   . Procan Sr (Procainamide Hcl)     PT. HAS HEART DISEASE CALLED HYPERTHROPHIC CARDIOMYOPATHY  . Quinidine     PT. HAS HEART DISEASE CALLED HYPERTHROPHIC CARDIOMYOPATHY       Current Outpatient Prescriptions  Medication Sig Dispense Refill  . amiodarone (PACERONE) 200 MG tablet 1 tab in the am and 1/2 tab in the pm      . aspirin 325 MG tablet Take 325 mg by mouth daily.        . carvedilol (COREG) 25 MG tablet TAKE 1 TABLET TWICE A DAY  180 tablet  2  . Cholecalciferol (VITAMIN D3) 1000 UNITS CAPS Take by mouth daily.        . colesevelam (WELCHOL) 625 MG tablet Take 1,875 mg by mouth daily.        . fish oil-omega-3 fatty acids 1000 MG capsule Take 3 capsules by mouth daily.        . furosemide (LASIX) 40 MG tablet Take 40 mg by mouth daily.        [name] glimepiride (AMARYL) 4 MG tablet Take 4 mg by mouth 2 (two) times daily.        . insulin glargine (LANTUS) 100 UNIT/ML injection Inject into the skin as directed.        [name] lisinopril (PRINIVIL,ZESTRIL) 2.5 MG tablet Take 1 tablet (2.5 mg total) by mouth 2 (two) times daily.  180 tablet  3  . mexiletine (MEXITIL) 150 MG capsule Take 1 capsule (150 mg total) by mouth  every 8 (eight) hours.  270 capsule  3  . pioglitazone (ACTOS) 30 MG tablet Take 30 mg by mouth daily.        . potassium chloride SA (K-DUR,KLOR-CON) 20 MEQ tablet Take 1 tablet (20 mEq total) by mouth daily.  90 tablet  3  . sitaGLIPtan (JANUVIA) 100 MG tablet Take 100 mg by mouth daily.        . Vitamin D, Ergocalciferol, (DRISDOL) 50000 UNITS CAPS          Past Medical History  Diagnosis Date  . DM   . HYPERLIPIDEMIA-MIXED   . Obesity, unspecified   . HYPERTENSION, UNSPECIFIED   . MYOCARDIAL INFARCTION     LAD 50% to 60% stenosis, OM 150% stenosis, OM 240% stenosis, right coronary artery 40% stenosis, 11/12  . Hypertrophic obstructive cardiomyopathy     Poor acoustic windows on echo 2010  . CARDIOMYOPATHY, SECONDARY   . AV BLOCK, COMPLETE   . RENAL INSUFFICIENCY   . AUTOMATIC IMPLANTABLE CARDIAC DEFIBRILLATOR SITU   . Blockage of coronary artery of heart   . BPH (benign prostatic hypertrophy)   . Colon polyps   . Ventricular tachycardia  ROS:   All systems reviewed and negative except as noted in the HPI.   Past Surgical History  Procedure Date  . Pacemaker insertion     medtronic  . Cardiac catheterization   . Facial reconstruction surgery     after trauma in 2006     Family History  Problem Relation Age of Onset  . Diabetic kidney disease Father   . Coronary artery disease Neg Hx      History   Social History  . Marital Status: Married    Spouse Name: N/A    Number of Children: N/A  . Years of Education: N/A   Occupational History  . Not on file.   Social History Main Topics  . Smoking status: Former Smoker    Quit date: 10/04/1968  . Smokeless tobacco: Not on file  . Alcohol Use: Yes     rarely  . Drug Use: Not on file  . Sexually Active: Not on file   Other Topics Concern  . Not on file   Social History Narrative  . No narrative on file     BP 102/68  Pulse 77  Ht 6\' 1"  (1.854 m)  Wt 130.999 kg (288 lb 12.8 oz)  BMI 38.10  kg/m2  Physical Exam: Diskempt appearing middle-aged man, NAD HEENT: Unremarkable Neck:  No JVD, no thyromegally Lungs:  Clear with no wheezes, rales, or rhonchi. Well-healed ICD incision. HEART:  Regular rate rhythm, no murmurs, no rubs, no clicks Abd:  soft, positive bowel sounds, no organomegally, no rebound, no guarding Ext:  2 plus pulses, no edema, no cyanosis, no clubbing Skin:  No rashes no nodules Neuro:  CN II through XII intact, motor grossly intact  EKG Normal sinus rhythm with biventricular pacing. DEVICE  Normal device function.  See PaceArt for details. No sustained ventricular tachycardia. Fluid index is elevated.   Assess/Plan:

## 2011-11-09 NOTE — Assessment & Plan Note (Signed)
He appears to be maintaining sinus rhythm on amiodarone. He will reduce his dose to 400 mg a day on March 1. I plan further reductions following this.

## 2011-11-09 NOTE — Patient Instructions (Signed)
Your physician recommends that you schedule a follow-up appointment in: 3 months with Dr [name]  Your physician has recommended you make the following change in your medication:  1) Increase your Furosemide to 2 tablets daily for 4 days then go back to once daily 2) Stay on Amiodarone 400mg  1 1/2 tablets daily until 12/03/11, then decrease to 400mg  1 tablet daily thereafter

## 2011-11-09 NOTE — Assessment & Plan Note (Signed)
His current symptoms are class 2-3. His fluid index is elevated. I have recommended that he increase his Lasix to 80 mg daily for a total of 4 days and then decrease his dose back to 40 mg daily.

## 2011-11-09 NOTE — Assessment & Plan Note (Signed)
His device is working normally. His left ventricular pacing lead was reprogrammed to.

## 2011-12-02 ENCOUNTER — Other Ambulatory Visit: Payer: Self-pay | Admitting: Cardiology

## 2011-12-02 NOTE — Telephone Encounter (Signed)
..   Requested Prescriptions   Pending Prescriptions Disp Refills  . carvedilol (COREG) 25 MG tablet [Pharmacy Med Name: CARVEDILOL TABS 25MG ] 180 tablet 4    Sig: TAKE 1 TABLET TWICE A DAY

## 2012-02-09 ENCOUNTER — Telehealth: Payer: Self-pay | Admitting: Internal Medicine

## 2012-02-09 ENCOUNTER — Encounter: Payer: Self-pay | Admitting: Internal Medicine

## 2012-02-09 ENCOUNTER — Encounter: Payer: Self-pay | Admitting: *Deleted

## 2012-02-09 ENCOUNTER — Ambulatory Visit (INDEPENDENT_AMBULATORY_CARE_PROVIDER_SITE_OTHER): Payer: BC Managed Care – PPO | Admitting: Internal Medicine

## 2012-02-09 VITALS — BP 108/74 | HR 64 | Ht 73.0 in | Wt 291.4 lb

## 2012-02-09 DIAGNOSIS — I442 Atrioventricular block, complete: Secondary | ICD-10-CM

## 2012-02-09 DIAGNOSIS — I472 Ventricular tachycardia: Secondary | ICD-10-CM

## 2012-02-09 DIAGNOSIS — I5022 Chronic systolic (congestive) heart failure: Secondary | ICD-10-CM

## 2012-02-09 LAB — ICD DEVICE OBSERVATION
AL IMPEDENCE ICD: 494 Ohm
ATRIAL PACING ICD: 99.61 pct
BAMS-0001: 170 {beats}/min
CHARGE TIME: 11.24 s
PACEART VT: 32
RV LEAD AMPLITUDE: 6.3 mv
RV LEAD IMPEDENCE ICD: 475 Ohm
TOT-0001: 14
TOT-0002: 2
TZAT-0001ATACH: 1
TZAT-0001ATACH: 2
TZAT-0001ATACH: 3
TZAT-0002ATACH: NEGATIVE
TZAT-0004SLOWVT: 8
TZAT-0004SLOWVT: 8
TZAT-0011FASTVT: 10 ms
TZAT-0011SLOWVT: 10 ms
TZAT-0012ATACH: 150 ms
TZAT-0012ATACH: 150 ms
TZAT-0012ATACH: 150 ms
TZAT-0012FASTVT: 200 ms
TZAT-0012FASTVT: 200 ms
TZAT-0012SLOWVT: 200 ms
TZAT-0012SLOWVT: 200 ms
TZAT-0013FASTVT: 2
TZAT-0013FASTVT: 2
TZAT-0018ATACH: NEGATIVE
TZAT-0018ATACH: NEGATIVE
TZAT-0018FASTVT: NEGATIVE
TZAT-0018FASTVT: NEGATIVE
TZAT-0019ATACH: 6 V
TZAT-0019FASTVT: 8 V
TZAT-0019FASTVT: 8 V
TZAT-0019SLOWVT: 8 V
TZAT-0019SLOWVT: 8 V
TZAT-0020ATACH: 1.5 ms
TZAT-0020FASTVT: 1.5 ms
TZAT-0020FASTVT: 1.5 ms
TZAT-0020SLOWVT: 1.5 ms
TZAT-0020SLOWVT: 1.5 ms
TZON-0003ATACH: 350 ms
TZON-0003FASTVT: 350 ms
TZON-0003SLOWVT: 500 ms
TZON-0003VSLOWVT: 510 ms
TZON-0004VSLOWVT: 20
TZST-0001ATACH: 4
TZST-0001ATACH: 6
TZST-0001FASTVT: 4
TZST-0001FASTVT: 5
TZST-0001SLOWVT: 4
TZST-0002ATACH: NEGATIVE
TZST-0002SLOWVT: NEGATIVE
TZST-0002SLOWVT: NEGATIVE
TZST-0003FASTVT: 35 J
TZST-0003FASTVT: 35 J

## 2012-02-09 NOTE — Telephone Encounter (Signed)
Dr [name] aware and told patient to continue Amiodarone 400mg  daily

## 2012-02-09 NOTE — Patient Instructions (Signed)
Your physician wants you to follow-up in: 3 months with Dr. Taylor. You will receive a reminder letter in the mail two months in advance. If you don't receive a letter, please call our office to schedule the follow-up appointment. 

## 2012-02-09 NOTE — Telephone Encounter (Signed)
New Problem:     The patient's wife called to say that his medication bottle for his Amioderone says 400mg .  Please call back if you have any questions.

## 2012-02-09 NOTE — Progress Notes (Signed)
HPI [name] returns today for followup. He is a very pleasant middle-age man with a nonischemic cardiomyopathy, ventricular tachycardia, status post ablation, with recurrent ventricular tachycardia now taking a combination of amiodarone and mexiletine. He does palpitations but has had no ICD shocks. He has begun to increase his activity. It's unclear whether he is taking 200 mg daily of amiodarone versus 300 mg versus 400 mg. His wife gives him his prescriptions and is unsure of the dose today. No chest pain or shortness of breath. He remains sedentary. Allergies  Allergen Reactions  . Ace Inhibitors     AVOID ALL  BECAUSE OF HEART DISEASE   . Mexitil     Pt has heart disease called HYPERTHROPHIC CARDIOMYOPATHY  . Nitroglycerin   . Procan Sr (Procainamide Hcl)     PT. HAS HEART DISEASE CALLED HYPERTHROPHIC CARDIOMYOPATHY  . Quinidine     PT. HAS HEART DISEASE CALLED HYPERTHROPHIC CARDIOMYOPATHY       Current Outpatient Prescriptions  Medication Sig Dispense Refill  . amiodarone (PACERONE) 200 MG tablet Take 400 mg by mouth daily. 1 tab in the am and 1/2 tab in the pm      . aspirin 325 MG tablet Take 325 mg by mouth daily.        . carvedilol (COREG) 25 MG tablet TAKE 1 TABLET TWICE A DAY  180 tablet  4  . cholecalciferol (VITAMIN D) 1000 UNITS tablet Take 1,000 Units by mouth daily.      . colesevelam (WELCHOL) 625 MG tablet Take 1,875 mg by mouth daily.        . fish oil-omega-3 fatty acids 1000 MG capsule Take 3 capsules by mouth daily.        . furosemide (LASIX) 40 MG tablet Take 40 mg by mouth daily.        [name] glimepiride (AMARYL) 4 MG tablet Take 4 mg by mouth 2 (two) times daily.       . insulin glargine (LANTUS) 100 UNIT/ML injection Inject into the skin as directed.        [name] levothyroxine (SYNTHROID, LEVOTHROID) 50 MCG tablet Take 50 mcg by mouth daily.      [name] lisinopril (PRINIVIL,ZESTRIL) 2.5 MG tablet Take 1 tablet (2.5 mg total) by mouth 2 (two) times daily.  180 tablet  3   . mexiletine (MEXITIL) 150 MG capsule Take 1 capsule (150 mg total) by mouth every 8 (eight) hours.  270 capsule  3  . omeprazole (PRILOSEC) 20 MG capsule Take 20 mg by mouth daily.      . potassium chloride SA (K-DUR,KLOR-CON) 20 MEQ tablet Take 1 tablet (20 mEq total) by mouth daily.  90 tablet  3  . sitaGLIPtan (JANUVIA) 100 MG tablet Take 100 mg by mouth daily.           Past Medical History  Diagnosis Date  . DM   . HYPERLIPIDEMIA-MIXED   . Obesity, unspecified   . HYPERTENSION, UNSPECIFIED   . MYOCARDIAL INFARCTION     LAD 50% to 60% stenosis, OM 150% stenosis, OM 240% stenosis, right coronary artery 40% stenosis, 11/12  . Hypertrophic obstructive cardiomyopathy     Poor acoustic windows on echo 2010  . CARDIOMYOPATHY, SECONDARY   . AV BLOCK, COMPLETE   . RENAL INSUFFICIENCY   . AUTOMATIC IMPLANTABLE CARDIAC DEFIBRILLATOR SITU   . Blockage of coronary artery of heart   . BPH (benign prostatic hypertrophy)   . Colon polyps   . Ventricular tachycardia  ROS:   All systems reviewed and negative except as noted in the HPI.   Past Surgical History  Procedure Date  . Pacemaker insertion     medtronic  . Cardiac catheterization   . Facial reconstruction surgery     after trauma in 2006     Family History  Problem Relation Age of Onset  . Diabetic kidney disease Father   . Coronary artery disease Neg Hx      History   Social History  . Marital Status: Married    Spouse Name: N/A    Number of Children: N/A  . Years of Education: N/A   Occupational History  . Not on file.   Social History Main Topics  . Smoking status: Former Smoker    Quit date: 10/04/1968  . Smokeless tobacco: Not on file  . Alcohol Use: Yes     rarely  . Drug Use: Not on file  . Sexually Active: Not on file   Other Topics Concern  . Not on file   Social History Narrative  . No narrative on file     BP 108/74  Pulse 64  Ht 6\' 1"  (1.854 m)  Wt 291 lb 6.4 oz (132.178 kg)   BMI 38.45 kg/m2  Physical Exam:  Well appearing middle-aged man, NAD HEENT: Unremarkable Neck:  No JVD, no thyromegally Lungs:  Clear with no wheezes, rales, or rhonchi. HEART:  Regular rate rhythm, no murmurs, no rubs, no clicks Abd:  soft, positive bowel sounds, no organomegally, no rebound, no guarding Ext:  2 plus pulses, no edema, no cyanosis, no clubbing Skin:  No rashes no nodules Neuro:  CN II through XII intact, motor grossly intact  DEVICE  Normal device function.  See PaceArt for details. 32 episodes of ventricular tachycardia all with successful anti-tachycardic pacing  Assess/Plan:

## 2012-02-09 NOTE — Assessment & Plan Note (Signed)
His current symptoms are class II. He will continue his current medical therapy. I've asked the patient to maintain a low-sodium diet.

## 2012-02-09 NOTE — Assessment & Plan Note (Signed)
He continues to experience episodes of ventricular tachycardia. I've asked the patient and his wife to recheck his prescription. He is to be on at least 300 or 400 mg of amiodarone daily.

## 2012-03-01 ENCOUNTER — Ambulatory Visit: Payer: BC Managed Care – PPO | Admitting: Cardiology

## 2012-04-05 ENCOUNTER — Ambulatory Visit (INDEPENDENT_AMBULATORY_CARE_PROVIDER_SITE_OTHER): Payer: BC Managed Care – PPO | Admitting: Cardiology

## 2012-04-05 ENCOUNTER — Encounter: Payer: Self-pay | Admitting: Cardiology

## 2012-04-05 VITALS — BP 115/70 | HR 69 | Ht 73.0 in | Wt 293.0 lb

## 2012-04-05 DIAGNOSIS — I219 Acute myocardial infarction, unspecified: Secondary | ICD-10-CM

## 2012-04-05 DIAGNOSIS — I10 Essential (primary) hypertension: Secondary | ICD-10-CM

## 2012-04-05 DIAGNOSIS — I429 Cardiomyopathy, unspecified: Secondary | ICD-10-CM

## 2012-04-05 DIAGNOSIS — I472 Ventricular tachycardia: Secondary | ICD-10-CM

## 2012-04-05 NOTE — Progress Notes (Signed)
HPI The patient presents for followup of coronary disease cardiomyopathy and ventricular tachycardia.  Since I last saw him he has done well.  The patient denies any new symptoms such as chest discomfort, neck or arm discomfort. There has been no new shortness of breath, PND or orthopnea. There have been no reported presyncope or syncope.  He has had occasional palpitations but no firings of his defibrillator. He's doing a little walking and can get on his exercise machine briefly. He slowly increasing his activity.  Allergies  Allergen Reactions  . Ace Inhibitors     AVOID ALL  BECAUSE OF HEART DISEASE   . Mexitil     Pt has heart disease called HYPERTHROPHIC CARDIOMYOPATHY  . Nitroglycerin   . Procan Sr (Procainamide Hcl)     PT. HAS HEART DISEASE CALLED HYPERTHROPHIC CARDIOMYOPATHY  . Quinidine     PT. HAS HEART DISEASE CALLED HYPERTHROPHIC CARDIOMYOPATHY      Current Outpatient Prescriptions  Medication Sig Dispense Refill  . amiodarone (PACERONE) 200 MG tablet Take 400 mg by mouth daily. 1 tab in the am      . aspirin 325 MG tablet Take 325 mg by mouth daily.        . carvedilol (COREG) 25 MG tablet TAKE 1 TABLET TWICE A DAY  180 tablet  4  . cholecalciferol (VITAMIN D) 1000 UNITS tablet Take 1,000 Units by mouth daily. 2000 mg daily      . colesevelam (WELCHOL) 625 MG tablet Take 1,875 mg by mouth 3 (three) times daily.       . fish oil-omega-3 fatty acids 1000 MG capsule Take 3 capsules by mouth daily.        . furosemide (LASIX) 40 MG tablet Take 40 mg by mouth daily.        [name] glimepiride (AMARYL) 4 MG tablet Take 4 mg by mouth 2 (two) times daily.       . insulin glargine (LANTUS) 100 UNIT/ML injection Inject 60 Units into the skin as directed.       [name] levothyroxine (SYNTHROID, LEVOTHROID) 75 MCG tablet Take 75 mcg by mouth daily.      [name] lisinopril (PRINIVIL,ZESTRIL) 2.5 MG tablet Take 1 tablet (2.5 mg total) by mouth 2 (two) times daily.  180 tablet  3  . mexiletine  (MEXITIL) 150 MG capsule Take 1 capsule (150 mg total) by mouth every 8 (eight) hours.  270 capsule  3  . omeprazole (PRILOSEC) 20 MG capsule Take 20 mg by mouth daily.      . Pitavastatin Calcium (LIVALO) 2 MG TABS Take by mouth daily.      . sitaGLIPtan (JANUVIA) 100 MG tablet Take 100 mg by mouth daily. Take 1/2 tab daily        Past Medical History  Diagnosis Date  . DM   . HYPERLIPIDEMIA-MIXED   . Obesity, unspecified   . HYPERTENSION, UNSPECIFIED   . MYOCARDIAL INFARCTION     LAD 50% to 60% stenosis, OM 150% stenosis, OM 240% stenosis, right coronary artery 40% stenosis, 11/12  . Hypertrophic obstructive cardiomyopathy     Poor acoustic windows on echo 2010  . CARDIOMYOPATHY, SECONDARY   . AV BLOCK, COMPLETE   . RENAL INSUFFICIENCY   . AUTOMATIC IMPLANTABLE CARDIAC DEFIBRILLATOR SITU   . Blockage of coronary artery of heart   . BPH (benign prostatic hypertrophy)   . Colon polyps   . Ventricular tachycardia     Past Surgical History  Procedure Date  .  Pacemaker insertion     medtronic  . Cardiac catheterization   . Facial reconstruction surgery     after trauma in 2006    ROS:  As stated in the HPI and negative for all other systems.  PHYSICAL EXAM BP 115/70  Pulse 69  Ht 6\' 1"  (1.854 m)  Wt 293 lb (132.904 kg)  BMI 38.66 kg/m2 GENERAL:  Well appearing HEENT:  Pupils equal round and reactive, fundi not visualized, oral mucosa unremarkable, edentulous NECK:  No jugular venous distention, waveform within normal limits, carotid upstroke brisk and symmetric, no bruits, no thyromegaly LUNGS:  Clear to auscultation bilaterally BACK:  No CVA tenderness CHEST:  Well healed device pocket HEART:  PMI not displaced or sustained,S1 and S2 within normal limits, no S3, no S4, no clicks, no rubs, no murmurs ABD:  Flat, positive bowel sounds normal in frequency in pitch, no bruits, no rebound, no guarding, no midline pulsatile mass, no hepatomegaly, no splenomegaly, obese EXT:   2 plus pulses throughout, trace edema, no cyanosis no clubbing SKIN:  No rashes no nodules  EKG:  Sinus with paced ventricular rhythm, rate 69. 04/05/2012   ASSESSMENT AND PLAN

## 2012-04-05 NOTE — Assessment & Plan Note (Signed)
He seems to be euvolemic and he will continue the meds as listed.

## 2012-04-05 NOTE — Patient Instructions (Addendum)
The current medical regimen is effective;  continue present plan and medications.  Follow up in 6 months with Dr Hochrein.  You will receive a letter in the mail 2 months before you are due.  Please call us when you receive this letter to schedule your follow up appointment.  

## 2012-04-05 NOTE — Assessment & Plan Note (Signed)
The patient has no new sypmtoms.  No further cardiovascular testing is indicated.  We will continue with aggressive risk reduction and meds as listed.  

## 2012-04-05 NOTE — Assessment & Plan Note (Signed)
I clarified that he is taking 400 mg of amiodarone. He has appropriate followup of his labs. No change in therapy is indicated.

## 2012-04-05 NOTE — Assessment & Plan Note (Signed)
This is being managed in the context of treating his CHF  

## 2012-05-12 ENCOUNTER — Ambulatory Visit (INDEPENDENT_AMBULATORY_CARE_PROVIDER_SITE_OTHER): Payer: BC Managed Care – PPO | Admitting: Internal Medicine

## 2012-05-12 ENCOUNTER — Encounter: Payer: Self-pay | Admitting: Internal Medicine

## 2012-05-12 VITALS — BP 108/74 | HR 81 | Ht 73.0 in | Wt 293.1 lb

## 2012-05-12 DIAGNOSIS — I10 Essential (primary) hypertension: Secondary | ICD-10-CM

## 2012-05-12 DIAGNOSIS — I442 Atrioventricular block, complete: Secondary | ICD-10-CM

## 2012-05-12 DIAGNOSIS — I472 Ventricular tachycardia: Secondary | ICD-10-CM

## 2012-05-12 DIAGNOSIS — Z95810 Presence of automatic (implantable) cardiac defibrillator: Secondary | ICD-10-CM

## 2012-05-12 DIAGNOSIS — I5022 Chronic systolic (congestive) heart failure: Secondary | ICD-10-CM

## 2012-05-12 LAB — ICD DEVICE OBSERVATION
AL AMPLITUDE: 1.75 mv
AL THRESHOLD: 0.5 V
BATTERY VOLTAGE: 2.8901 V
FVT: 0
LV LEAD IMPEDENCE ICD: 532 Ohm
LV LEAD THRESHOLD: 1.375 V
RV LEAD IMPEDENCE ICD: 475 Ohm
RV LEAD THRESHOLD: 1 V
TOT-0006: 20100521000000
TZAT-0001FASTVT: 1
TZAT-0001FASTVT: 2
TZAT-0001SLOWVT: 1
TZAT-0001SLOWVT: 2
TZAT-0002ATACH: NEGATIVE
TZAT-0002ATACH: NEGATIVE
TZAT-0004FASTVT: 8
TZAT-0004FASTVT: 8
TZAT-0005FASTVT: 88 pct
TZAT-0005FASTVT: 88 pct
TZAT-0005SLOWVT: 88 pct
TZAT-0005SLOWVT: 91 pct
TZAT-0011SLOWVT: 10 ms
TZAT-0011SLOWVT: 10 ms
TZAT-0012SLOWVT: 200 ms
TZAT-0013SLOWVT: 6
TZAT-0013SLOWVT: 6
TZAT-0018ATACH: NEGATIVE
TZAT-0018ATACH: NEGATIVE
TZAT-0018FASTVT: NEGATIVE
TZAT-0018SLOWVT: NEGATIVE
TZAT-0018SLOWVT: NEGATIVE
TZAT-0019ATACH: 6 V
TZAT-0019ATACH: 6 V
TZAT-0020ATACH: 1.5 ms
TZAT-0020ATACH: 1.5 ms
TZON-0004SLOWVT: 52
TZON-0005SLOWVT: 12
TZST-0001ATACH: 5
TZST-0001FASTVT: 3
TZST-0001FASTVT: 6
TZST-0001SLOWVT: 5
TZST-0001SLOWVT: 6
TZST-0002ATACH: NEGATIVE
TZST-0002ATACH: NEGATIVE
TZST-0002SLOWVT: NEGATIVE
TZST-0002SLOWVT: NEGATIVE
TZST-0003FASTVT: 35 J
TZST-0003FASTVT: 35 J
VENTRICULAR PACING ICD: 99.86 pct
VF: 0

## 2012-05-12 MED ORDER — AMIODARONE HCL 400 MG PO TABS: ORAL_TABLET | ORAL | Status: DC

## 2012-05-12 NOTE — Progress Notes (Signed)
HPI Mr. Havlin returns today for followup. He is a very pleasant 56 year old man with a hypertrophic cardiomyopathy, ventricular tachycardia, status post ablation, with persistent ventricular tachycardia requiring amiodarone in conjunction with beta blockers and mexiletine. In the interim, he has been stable. He remains active, working in his garden. He has had no recent ICD shocks. Allergies  Allergen Reactions  . Ace Inhibitors     AVOID ALL  BECAUSE OF HEART DISEASE   . Mexitil     Pt has heart disease called HYPERTHROPHIC CARDIOMYOPATHY  . Nitroglycerin   . Procan Sr (Procainamide Hcl)     PT. HAS HEART DISEASE CALLED HYPERTHROPHIC CARDIOMYOPATHY  . Quinidine     PT. HAS HEART DISEASE CALLED HYPERTHROPHIC CARDIOMYOPATHY       Current Outpatient Prescriptions  Medication Sig Dispense Refill  . amiodarone (PACERONE) 200 MG tablet Take 400 mg by mouth daily. 1 tab in the am      . aspirin 325 MG tablet Take 325 mg by mouth daily.        . carvedilol (COREG) 25 MG tablet TAKE 1 TABLET TWICE A DAY  180 tablet  4  . cholecalciferol (VITAMIN D) 1000 UNITS tablet Take 1,000 Units by mouth daily. 2000 mg daily      . colesevelam (WELCHOL) 625 MG tablet Take 1,875 mg by mouth 3 (three) times daily.       . fish oil-omega-3 fatty acids 1000 MG capsule Take 3 capsules by mouth daily.        . furosemide (LASIX) 40 MG tablet Take 40 mg by mouth daily.        [name] glimepiride (AMARYL) 4 MG tablet Take 4 mg by mouth 2 (two) times daily.       . insulin glargine (LANTUS) 100 UNIT/ML injection Inject 65 Units into the skin as directed.       [name] levothyroxine (SYNTHROID, LEVOTHROID) 75 MCG tablet Take 75 mcg by mouth daily.      [name] lisinopril (PRINIVIL,ZESTRIL) 2.5 MG tablet Take 1 tablet (2.5 mg total) by mouth 2 (two) times daily.  180 tablet  3  . mexiletine (MEXITIL) 150 MG capsule Take 1 capsule (150 mg total) by mouth every 8 (eight) hours.  270 capsule  3  . Pitavastatin Calcium (LIVALO) 2 MG TABS  Take by mouth daily.      . sitaGLIPtan (JANUVIA) 100 MG tablet Take 100 mg by mouth daily. Take 1/2 tab daily         Past Medical History  Diagnosis Date  . DM   . HYPERLIPIDEMIA-MIXED   . Obesity, unspecified   . HYPERTENSION, UNSPECIFIED   . MYOCARDIAL INFARCTION     LAD 50% to 60% stenosis, OM 150% stenosis, OM 240% stenosis, right coronary artery 40% stenosis, 11/12  . Hypertrophic obstructive cardiomyopathy     Poor acoustic windows on echo 2010  . CARDIOMYOPATHY, SECONDARY   . AV BLOCK, COMPLETE   . RENAL INSUFFICIENCY   . AUTOMATIC IMPLANTABLE CARDIAC DEFIBRILLATOR SITU   . Blockage of coronary artery of heart   . BPH (benign prostatic hypertrophy)   . Colon polyps   . Ventricular tachycardia     ROS:   All systems reviewed and negative except as noted in the HPI.   Past Surgical History  Procedure Date  . Pacemaker insertion     medtronic  . Cardiac catheterization   . Facial reconstruction surgery     after trauma in 2006  Family History  Problem Relation Age of Onset  . Diabetic kidney disease Father   . Coronary artery disease Neg Hx      History   Social History  . Marital Status: Married    Spouse Name: N/A    Number of Children: N/A  . Years of Education: N/A   Occupational History  . Not on file.   Social History Main Topics  . Smoking status: Former Smoker    Quit date: 10/04/1968  . Smokeless tobacco: Never Used  . Alcohol Use: Yes     rarely  . Drug Use: Not on file  . Sexually Active: Not on file   Other Topics Concern  . Not on file   Social History Narrative  . No narrative on file     BP 108/74  Pulse 81  Ht 6\' 1"  (1.854 m)  Wt 293 lb 1.9 oz (132.958 kg)  BMI 38.67 kg/m2  Physical Exam:  Well appearing NAD HEENT: Unremarkable Neck:  No JVD, no thyromegally Lymphatics:  No adenopathy Back:  No CVA tenderness Lungs:  Clear HEART:  Regular rate rhythm, no murmurs, no rubs, no clicks Abd:  soft, positive  bowel sounds, no organomegally, no rebound, no guarding Ext:  2 plus pulses, no edema, no cyanosis, no clubbing Skin:  No rashes no nodules Neuro:  CN II through XII intact, motor grossly intact   DEVICE  Normal device function.  See PaceArt for details.   Assess/Plan:

## 2012-05-12 NOTE — Assessment & Plan Note (Signed)
His device is working normally. We'll plan to recheck in several months. 

## 2012-05-12 NOTE — Assessment & Plan Note (Signed)
He denies chest pain or shortness of breath. Continue current medical therapy.

## 2012-05-12 NOTE — Patient Instructions (Addendum)
Your physician wants you to follow-up in: 12 months with Dr Court Joy will receive a reminder letter in the mail two months in advance. If you don't receive a letter, please call our office to schedule the follow-up appointment.   Remote monitoring is used to monitor your Pacemaker of ICD from home. This monitoring reduces the number of office visits required to check your device to one time per year. It allows [place] to keep an eye on the functioning of your device to ensure it is working properly. You are scheduled for a device check from home on 08/14/12. You may send your transmission at any time that day. If you have a wireless device, the transmission will be sent automatically. After your physician reviews your transmission, you will receive a postcard with your next transmission date.   Your physician has recommended you make the following change in your medication:  1)Decrease Amiodarone to 1/2 Sunday and 1 tablet all other days

## 2012-05-12 NOTE — Assessment & Plan Note (Addendum)
His ventricular arrhythmias have remained stable. He will continue his current medical therapy. I've asked the patient to reduce his dose of amiodarone to 200 mg on Sunday and 400 mg daily, the rest of the week.

## 2012-06-09 ENCOUNTER — Encounter: Payer: Self-pay | Admitting: Internal Medicine

## 2012-06-12 ENCOUNTER — Telehealth: Payer: Self-pay | Admitting: *Deleted
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
## 2012-06-26 ENCOUNTER — Encounter: Payer: Self-pay | Admitting: Internal Medicine

## 2012-08-14 ENCOUNTER — Ambulatory Visit (INDEPENDENT_AMBULATORY_CARE_PROVIDER_SITE_OTHER): Payer: BC Managed Care – PPO | Admitting: *Deleted

## 2012-08-14 ENCOUNTER — Encounter: Payer: Self-pay | Admitting: Internal Medicine

## 2012-08-14 DIAGNOSIS — I5022 Chronic systolic (congestive) heart failure: Secondary | ICD-10-CM

## 2012-08-14 DIAGNOSIS — I472 Ventricular tachycardia, unspecified: Secondary | ICD-10-CM

## 2012-08-14 DIAGNOSIS — Z95810 Presence of automatic (implantable) cardiac defibrillator: Secondary | ICD-10-CM

## 2012-08-17 LAB — REMOTE ICD DEVICE
AL AMPLITUDE: 5.5 mv
ATRIAL PACING ICD: 99.97 pct
CHARGE TIME: 12.842 s
FVT: 0
PACEART VT: 0
RV LEAD IMPEDENCE ICD: 475 Ohm
TOT-0001: 15
TZAT-0001ATACH: 1
TZAT-0001FASTVT: 1
TZAT-0001FASTVT: 2
TZAT-0001SLOWVT: 1
TZAT-0001SLOWVT: 2
TZAT-0002ATACH: NEGATIVE
TZAT-0002ATACH: NEGATIVE
TZAT-0004SLOWVT: 8
TZAT-0004SLOWVT: 8
TZAT-0005SLOWVT: 88 pct
TZAT-0005SLOWVT: 91 pct
TZAT-0011SLOWVT: 10 ms
TZAT-0011SLOWVT: 10 ms
TZAT-0012ATACH: 150 ms
TZAT-0013FASTVT: 2
TZAT-0013FASTVT: 2
TZAT-0018FASTVT: NEGATIVE
TZAT-0018FASTVT: NEGATIVE
TZAT-0019ATACH: 6 V
TZAT-0019ATACH: 6 V
TZAT-0019ATACH: 6 V
TZAT-0019FASTVT: 8 V
TZAT-0019FASTVT: 8 V
TZAT-0020ATACH: 1.5 ms
TZAT-0020FASTVT: 1.5 ms
TZON-0003ATACH: 350 ms
TZON-0003FASTVT: 350 ms
TZON-0004SLOWVT: 52
TZON-0005SLOWVT: 12
TZST-0001ATACH: 4
TZST-0001ATACH: 5
TZST-0001FASTVT: 3
TZST-0001FASTVT: 6
TZST-0001SLOWVT: 3
TZST-0001SLOWVT: 4
TZST-0001SLOWVT: 5
TZST-0002SLOWVT: NEGATIVE
TZST-0002SLOWVT: NEGATIVE
TZST-0002SLOWVT: NEGATIVE
TZST-0003FASTVT: 35 J
TZST-0003FASTVT: 35 J
VENTRICULAR PACING ICD: 99.89 pct
VF: 0

## 2012-08-22 ENCOUNTER — Encounter: Payer: Self-pay | Admitting: *Deleted

## 2012-09-21 ENCOUNTER — Other Ambulatory Visit: Payer: Self-pay | Admitting: Cardiology

## 2012-09-28 ENCOUNTER — Encounter: Payer: Self-pay | Admitting: Cardiology

## 2012-10-11 ENCOUNTER — Encounter: Payer: Self-pay | Admitting: Cardiology

## 2012-10-11 ENCOUNTER — Ambulatory Visit (INDEPENDENT_AMBULATORY_CARE_PROVIDER_SITE_OTHER): Payer: BC Managed Care – PPO | Admitting: Cardiology

## 2012-10-11 VITALS — BP 122/80 | HR 76 | Ht 73.0 in | Wt 302.0 lb

## 2012-10-11 DIAGNOSIS — I219 Acute myocardial infarction, unspecified: Secondary | ICD-10-CM

## 2012-10-11 DIAGNOSIS — I10 Essential (primary) hypertension: Secondary | ICD-10-CM

## 2012-10-11 DIAGNOSIS — I472 Ventricular tachycardia, unspecified: Secondary | ICD-10-CM

## 2012-10-11 DIAGNOSIS — I442 Atrioventricular block, complete: Secondary | ICD-10-CM

## 2012-10-11 DIAGNOSIS — E785 Hyperlipidemia, unspecified: Secondary | ICD-10-CM

## 2012-10-11 DIAGNOSIS — I429 Cardiomyopathy, unspecified: Secondary | ICD-10-CM

## 2012-10-11 DIAGNOSIS — I5022 Chronic systolic (congestive) heart failure: Secondary | ICD-10-CM

## 2012-10-11 DIAGNOSIS — I498 Other specified cardiac arrhythmias: Secondary | ICD-10-CM

## 2012-10-11 DIAGNOSIS — E669 Obesity, unspecified: Secondary | ICD-10-CM

## 2012-10-11 DIAGNOSIS — N259 Disorder resulting from impaired renal tubular function, unspecified: Secondary | ICD-10-CM
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
## 2012-10-11 NOTE — Patient Instructions (Addendum)
The current medical regimen is effective;  continue present plan and medications.  Please see Dr Christell Constant about your upper respiratory issues.  Follow up in 6 months with Dr Antoine Poche.  You will receive a letter in the mail 2 months before you are due.  Please call us when you receive this letter to schedule your follow up appointment.

## 2012-10-25 ENCOUNTER — Ambulatory Visit: Payer: BC Managed Care – PPO | Admitting: Cardiology

## 2012-11-20 ENCOUNTER — Encounter: Payer: Self-pay | Admitting: Internal Medicine

## 2012-11-20 ENCOUNTER — Other Ambulatory Visit: Payer: Self-pay | Admitting: Internal Medicine

## 2012-11-20 ENCOUNTER — Ambulatory Visit (INDEPENDENT_AMBULATORY_CARE_PROVIDER_SITE_OTHER): Payer: BC Managed Care – PPO | Admitting: *Deleted

## 2012-11-20 DIAGNOSIS — Z95810 Presence of automatic (implantable) cardiac defibrillator: Secondary | ICD-10-CM

## 2012-11-20 DIAGNOSIS — I472 Ventricular tachycardia, unspecified: Secondary | ICD-10-CM

## 2012-11-20 DIAGNOSIS — I5022 Chronic systolic (congestive) heart failure: Secondary | ICD-10-CM

## 2012-11-29 LAB — REMOTE ICD DEVICE
AL AMPLITUDE: 1.6 mv
BAMS-0001: 170 {beats}/min
BRDY-0002LV: 60 {beats}/min
FVT: 0
LV LEAD THRESHOLD: 0.875 V
RV LEAD AMPLITUDE: 6.3 mv
RV LEAD IMPEDENCE ICD: 475 Ohm
TZAT-0001ATACH: 2
TZAT-0001ATACH: 3
TZAT-0001SLOWVT: 1
TZAT-0001SLOWVT: 2
TZAT-0011FASTVT: 10 ms
TZAT-0011FASTVT: 10 ms
TZAT-0012ATACH: 150 ms
TZAT-0012ATACH: 150 ms
TZAT-0012ATACH: 150 ms
TZAT-0012FASTVT: 200 ms
TZAT-0012FASTVT: 200 ms
TZAT-0018ATACH: NEGATIVE
TZAT-0018FASTVT: NEGATIVE
TZAT-0018SLOWVT: NEGATIVE
TZAT-0019ATACH: 6 V
TZAT-0019FASTVT: 8 V
TZAT-0019FASTVT: 8 V
TZAT-0019SLOWVT: 8 V
TZAT-0019SLOWVT: 8 V
TZAT-0020ATACH: 1.5 ms
TZAT-0020ATACH: 1.5 ms
TZAT-0020SLOWVT: 1.5 ms
TZAT-0020SLOWVT: 1.5 ms
TZON-0003ATACH: 350 ms
TZON-0003FASTVT: 350 ms
TZON-0003VSLOWVT: 510 ms
TZON-0004VSLOWVT: 20
TZST-0001ATACH: 4
TZST-0001ATACH: 6
TZST-0001FASTVT: 4
TZST-0001FASTVT: 6
TZST-0001SLOWVT: 4
TZST-0001SLOWVT: 6
TZST-0002ATACH: NEGATIVE
TZST-0002ATACH: NEGATIVE
TZST-0002SLOWVT: NEGATIVE
TZST-0003FASTVT: 35 J
TZST-0003FASTVT: 35 J
VF: 0

## 2012-12-13 ENCOUNTER — Encounter: Payer: Self-pay | Admitting: *Deleted

## 2013-01-29 ENCOUNTER — Ambulatory Visit: Payer: Self-pay | Admitting: Family Medicine

## 2013-01-30 ENCOUNTER — Encounter: Payer: Self-pay | Admitting: Family Medicine

## 2013-01-30 ENCOUNTER — Ambulatory Visit (INDEPENDENT_AMBULATORY_CARE_PROVIDER_SITE_OTHER): Payer: BC Managed Care – PPO | Admitting: Family Medicine

## 2013-01-30 VITALS — BP 115/81 | HR 87 | Temp 98.0°F | Ht 73.0 in | Wt 310.8 lb

## 2013-01-30 DIAGNOSIS — R7989 Other specified abnormal findings of blood chemistry: Secondary | ICD-10-CM

## 2013-01-30 DIAGNOSIS — E039 Hypothyroidism, unspecified: Secondary | ICD-10-CM

## 2013-01-30 DIAGNOSIS — I7090 Unspecified atherosclerosis: Secondary | ICD-10-CM

## 2013-01-30 DIAGNOSIS — N259 Disorder resulting from impaired renal tubular function, unspecified: Secondary | ICD-10-CM

## 2013-01-30 DIAGNOSIS — R5383 Other fatigue: Secondary | ICD-10-CM

## 2013-01-30 DIAGNOSIS — E785 Hyperlipidemia, unspecified: Secondary | ICD-10-CM

## 2013-01-30 DIAGNOSIS — E119 Type 2 diabetes mellitus without complications: Secondary | ICD-10-CM

## 2013-01-30 DIAGNOSIS — I10 Essential (primary) hypertension: Secondary | ICD-10-CM

## 2013-01-30 DIAGNOSIS — E559 Vitamin D deficiency, unspecified: Secondary | ICD-10-CM

## 2013-01-30 DIAGNOSIS — I2510 Atherosclerotic heart disease of native coronary artery without angina pectoris: Secondary | ICD-10-CM

## 2013-01-30 DIAGNOSIS — N189 Chronic kidney disease, unspecified: Secondary | ICD-10-CM

## 2013-01-30 DIAGNOSIS — I421 Obstructive hypertrophic cardiomyopathy: Secondary | ICD-10-CM

## 2013-01-30 LAB — POCT CBC
Lymph, poc: 1.9 (ref 0.6–3.4)
MCH, POC: 30.1 pg (ref 27–31.2)
MCHC: 34.2 g/dL (ref 31.8–35.4)
MCV: 87.9 fL (ref 80–97)
Platelet Count, POC: 154 10*3/uL (ref 142–424)
RDW, POC: 13.2 %
WBC: 6.3 10*3/uL (ref 4.6–10.2)

## 2013-01-30 LAB — HEPATIC FUNCTION PANEL
AST: 139 U/L — ABNORMAL HIGH (ref 0–37)
Albumin: 3.6 g/dL (ref 3.5–5.2)
Alkaline Phosphatase: 89 U/L (ref 39–117)
Bilirubin, Direct: 0.1 mg/dL (ref 0.0–0.3)
Total Bilirubin: 0.6 mg/dL (ref 0.3–1.2)

## 2013-01-30 LAB — BASIC METABOLIC PANEL WITH GFR
CO2: 25 mEq/L (ref 19–32)
Chloride: 102 mEq/L (ref 96–112)
Creat: 1.52 mg/dL — ABNORMAL HIGH (ref 0.50–1.35)

## 2013-01-30 LAB — THYROID PANEL WITH TSH
Free Thyroxine Index: 3.7 (ref 1.0–3.9)
T3 Uptake: 34.5 % (ref 22.5–37.0)
TSH: 5.288 u[IU]/mL — ABNORMAL HIGH (ref 0.350–4.500)

## 2013-01-30 LAB — POCT GLYCOSYLATED HEMOGLOBIN (HGB A1C): Hemoglobin A1C: 10.9

## 2013-01-30 LAB — GLUCOSE, POCT (MANUAL RESULT ENTRY): POC Glucose: 286 mg/dl — AB (ref 70–99)

## 2013-01-30 NOTE — Progress Notes (Signed)
Subjective:    Patient ID: Dakota Brook Sr., male    DOB: [birth year], 57 y.o.   MRN: [id number]  HPI This patient presents for recheck of multiple medical problems. dNo one accompanies the patient today.  Patient Active Problem List   Diagnosis Date Noted  . Ventricular tachycardia 08/12/2011  . Chronic systolic heart failure 01/27/2011  . OBESITY, UNSPECIFIED 01/07/2010  . RENAL INSUFFICIENCY 01/07/2010  . AUTOMATIC IMPLANTABLE CARDIAC DEFIBRILLATOR SITU 06/10/2009  . DM 01/16/2009  . HYPERLIPIDEMIA-MIXED 01/16/2009  . HYPERTENSION, UNSPECIFIED 01/16/2009  . MYOCARDIAL INFARCTION 01/16/2009  . HYPERTROPHIC OBSTRUCTIVE CARDIOMYOPATHY 01/16/2009  . CARDIOMYOPATHY, SECONDARY 01/16/2009  . AV BLOCK, COMPLETE 01/16/2009  . BRADYCARDIA 01/16/2009    In addition, he sees Dr Antoine Poche regularly about every 6 months.  The allergies, current medications, past medical history, surgical history, family and social history are reviewed.  Immunizations reviewed.  Health maintenance reviewed.  The following items are outstanding: None      Review of Systems  Constitutional: Negative.   HENT: Negative.   Eyes: Negative.   Respiratory: Negative.   Cardiovascular: Negative.   Gastrointestinal: Negative.   Endocrine: Negative.   Genitourinary: Negative.   Musculoskeletal: Positive for back pain (LBP).  Allergic/Immunologic: Negative.   Neurological: Negative.   Psychiatric/Behavioral: Negative.    Back pain is unchanged from the past. Also complains of nodule below and lateral to left knee. This has been there for many years but become very sensitive. See outside blood sugars, fasting blood sugars seem to be ranging in the 190 range. Occasionally there are some rapid drops between the fasting blood sugar and 1 this checked a couple hours later     Objective:   Physical Exam BP 115/81  Pulse 87  Temp(Src) 98 F (36.7 C) (Oral)  Ht 6\' 1"  (1.854 m)  Wt 310 lb 12.8 oz  (140.978 kg)  BMI 41.01 kg/m2  The patient appeared well nourished and normally developed but overweight. He was alert and oriented to time and place. Speech, behavior and judgement appear normal. Vital signs as documented.  Head exam is unremarkable. No scleral icterus or pallor noted.  Neck is without jugular venous distension, thyromegally, or carotid bruits. Carotid upstrokes are brisk bilaterally. No cervical adenopathy. Lungs are clear anteriorly and posteriorly to auscultation. Normal respiratory effort. Cardiac exam reveals regular rate and rhythm at 60 per minute. First and second heart sounds normal. No murmurs, rubs or gallops.  Abdominal exam reveals an obese abdomen with normal bowl sounds, no masses, no organomegaly and no aortic enlargement. No inguinal adenopathy. Extremities are nonedematous and both femoral and pedal pulses are normal. Skin without pallor or jaundice. The nodule on his left leg below the knee is a varicosity which is very tender. Otherwise the skin is warm and dry, without rash. Neurologic exam reveals normal deep tendon reflexes and normal sensation. Diabetic foot exam was done.         Assessment & Plan:  1. ASCVD (arteriosclerotic cardiovascular disease)  2. Diabetes - POCT UA - Microalbumin; Standing - POCT glycosylated hemoglobin (Hb A1C); Standing - POCT UA - Microalbumin - POCT glycosylated hemoglobin (Hb A1C)  3. Hyperlipidemia - NMR Lipoprofile with Lipids; Standing - Hepatic function panel; Standing - NMR Lipoprofile with Lipids - Hepatic function panel  4. CKD (chronic kidney disease) - BASIC METABOLIC PANEL WITH GFR; Standing - BASIC METABOLIC PANEL WITH GFR  5. Hypothyroid - Thyroid Panel With TSH  6. Vitamin D deficiency - Vitamin D 25 hydroxy;  Standing - Vitamin D 25 hydroxy  7. Fatigue - POCT CBC; Standing - POCT CBC  8. DM  9. HYPERLIPIDEMIA-MIXED  10. HYPERTENSION, UNSPECIFIED  11. Hypertrophic obstructive  cardiomyopathy  12. RENAL INSUFFICIENCY   Patient Instructions  For now continue current medications and aggressive therapeutic lifestyle changes Will call with lab results once available  monitor blood sugars closely

## 2013-01-30 NOTE — Addendum Note (Signed)
Addended by: Orma Render F on: 01/30/2013 10:47 AM   Modules accepted: Orders

## 2013-01-30 NOTE — Patient Instructions (Addendum)
For now continue current medications and aggressive therapeutic lifestyle changes Will call with lab results once available  monitor blood sugars closely

## 2013-01-31 LAB — NMR LIPOPROFILE WITH LIPIDS
Cholesterol, Total: 198 mg/dL (ref <200)
HDL Particle Number: 31.8 umol/L (ref >=30.5)
LDL (calc): 116 mg/dL — ABNORMAL HIGH (ref <100)
LP-IR Score: 67 — ABNORMAL HIGH (ref <=45)
Small LDL Particle Number: 1523 nmol/L — ABNORMAL HIGH (ref <=527)

## 2013-02-01 ENCOUNTER — Other Ambulatory Visit: Payer: Self-pay | Admitting: Cardiology

## 2013-02-02 ENCOUNTER — Other Ambulatory Visit (INDEPENDENT_AMBULATORY_CARE_PROVIDER_SITE_OTHER): Payer: BC Managed Care – PPO

## 2013-02-02 DIAGNOSIS — Z1212 Encounter for screening for malignant neoplasm of rectum: Secondary | ICD-10-CM

## 2013-02-02 NOTE — Progress Notes (Signed)
Patient dropped off fobt 

## 2013-02-07 ENCOUNTER — Other Ambulatory Visit: Payer: Self-pay | Admitting: *Deleted

## 2013-02-07 DIAGNOSIS — E039 Hypothyroidism, unspecified: Secondary | ICD-10-CM

## 2013-02-07 MED ORDER — LEVOTHYROXINE SODIUM 100 MCG PO TABS: 100.0000 ug | ORAL_TABLET | Freq: Every day | ORAL | Status: DC

## 2013-02-07 NOTE — Addendum Note (Signed)
Addended by: Monica Becton on: 02/07/2013 09:47 AM   Modules accepted: Orders

## 2013-02-07 NOTE — Addendum Note (Signed)
Addended by: Monica Becton on: 02/07/2013 09:49 AM   Modules accepted: Orders

## 2013-03-01 ENCOUNTER — Ambulatory Visit (INDEPENDENT_AMBULATORY_CARE_PROVIDER_SITE_OTHER): Payer: BC Managed Care – PPO | Admitting: Pharmacist

## 2013-03-01 ENCOUNTER — Encounter: Payer: Self-pay | Admitting: Pharmacist

## 2013-03-01 VITALS — BP 140/80 | HR 76 | Ht 73.0 in | Wt 308.5 lb

## 2013-03-01 DIAGNOSIS — E785 Hyperlipidemia, unspecified: Secondary | ICD-10-CM

## 2013-03-01 DIAGNOSIS — E119 Type 2 diabetes mellitus without complications: Secondary | ICD-10-CM

## 2013-03-01 DIAGNOSIS — I10 Essential (primary) hypertension: Secondary | ICD-10-CM

## 2013-03-01 DIAGNOSIS — N259 Disorder resulting from impaired renal tubular function, unspecified: Secondary | ICD-10-CM

## 2013-03-01 MED ORDER — INSULIN GLARGINE 100 UNIT/ML ~~LOC~~ SOLN: 80.0000 [IU] | SUBCUTANEOUS | Status: DC

## 2013-03-01 MED ORDER — INSULIN LISPRO 100 UNIT/ML (KWIKPEN): PEN_INJECTOR | SUBCUTANEOUS | Status: DC

## 2013-03-01 MED ORDER — ROSUVASTATIN CALCIUM 10 MG PO TABS: 10.0000 mg | ORAL_TABLET | Freq: Every day | ORAL | Status: DC

## 2013-03-01 MED ORDER — LINAGLIPTIN 5 MG PO TABS: 5.0000 mg | ORAL_TABLET | Freq: Every day | ORAL | Status: DC

## 2013-03-01 NOTE — Progress Notes (Signed)
Diabetes Follow-Up Visit Chief Complaint:   Chief Complaint  Patient presents with  . Diabetes     Filed Vitals:   03/01/13 0942  BP: 140/80  Pulse: 76     HPI: Currently meds for DM include - lantus 75 units qam, Humalog 14 units with largest meal / supper, Januvia 50mg  daily and glimepiride 4mg  BID.  Insulin regimen was adjusted 11/2012.  At that time added Humalog with largest meal of day.  Patient is unable to take metformin due to elevated Scr.    Polyuria:  denies  Polydipsia:  denies Polyphagia:  denies  BMI:  Body mass index is 40.71 kg/(m^2).   Weight changes:  Increased by 4 lbs General Appearance:  obese Mood/Affect:  normal   Low fat/carbohydrate diet?  No Nicotine Abuse?  No Medication Compliance?  Yes Exercise?  No Alcohol Abuse?  Yes  Home BG Monitoring:  Checking 1 times a day. Average:  200  High: 271  Low:  112    Lab Results  Component Value Date   HGBA1C 10.9 01/30/2013    Previous A1C was 12.3% (09/2012)   No results found for this basename: Concepcion Elk    Lab Results  Component Value Date   LDLCALC 116* 01/30/2013   TRIG 194* 01/30/2013      Assessment: 1.  Diabetes.  Uncontrolled but improving 2.  Blood Pressure.    controlled 3.  Lipids.  uncontrolled  Recommendations: 1.  Medication recommendations at this time are as follows:   D/c Januvia and change to Tradjenta 5mg  daily since does not require renal adjustment  Increase crestor to 10mg  daily  Increase lantus to 80 units daily  Increase Humalog to 20 units with largest meal.  2.  Reviewed HBG goals:  Fasting 80-130 and 1-2 hour post prandial <180.  Patient is instructed to check BG 1-2 times per day.    3.  BP goal < 140/80. 4.  LDL goal of < 100, HDL > 40 and TG < 150. 5.  Eye Exam yearly and Dental Exam every 6 months. 6.  Dietary recommendations:  Limit calorie, carbohydrate and saturated fat intake 7.  Physical Activity recommendations:  Start with 10 minutes  and work up to 30 minutes daily as tolerated  8.  Return to clinic in 4-6 wks   Time spent counseling patient:  30 minutes

## 2013-03-20 ENCOUNTER — Telehealth: Payer: Self-pay | Admitting: *Deleted

## 2013-03-20 MED ORDER — LINAGLIPTIN 5 MG PO TABS: 5.0000 mg | ORAL_TABLET | Freq: Every day | ORAL | Status: DC

## 2013-03-20 MED ORDER — ROSUVASTATIN CALCIUM 20 MG PO TABS: 10.0000 mg | ORAL_TABLET | Freq: Every day | ORAL | Status: DC

## 2013-03-20 NOTE — Telephone Encounter (Signed)
Samples left up front for patient to pickup.  Crestor 10mg  not available.  Pt instructed to take 1/2 of 20mg  tablet.

## 2013-04-09 ENCOUNTER — Ambulatory Visit (INDEPENDENT_AMBULATORY_CARE_PROVIDER_SITE_OTHER): Payer: BC Managed Care – PPO | Admitting: Pharmacist

## 2013-04-09 VITALS — BP 122/70 | HR 74 | Ht 73.0 in | Wt 316.0 lb

## 2013-04-09 DIAGNOSIS — E039 Hypothyroidism, unspecified: Secondary | ICD-10-CM

## 2013-04-09 DIAGNOSIS — E119 Type 2 diabetes mellitus without complications: Secondary | ICD-10-CM

## 2013-04-09 LAB — THYROID PANEL WITH TSH
T4, Total: 11.9 ug/dL (ref 5.0–12.5)
TSH: 4.741 u[IU]/mL — ABNORMAL HIGH (ref 0.350–4.500)

## 2013-04-09 MED ORDER — INSULIN GLARGINE 100 UNIT/ML ~~LOC~~ SOLN: 45.0000 [IU] | Freq: Two times a day (BID) | SUBCUTANEOUS | Status: DC

## 2013-04-09 NOTE — Progress Notes (Signed)
Diabetes Follow-Up Visit Chief Complaint:   Chief Complaint  Patient presents with  . Diabetes     Filed Vitals:   04/09/13 0958  BP: 122/70  Pulse: 74   Filed Weights   04/09/13 0958  Weight: 316 lb (143.337 kg)     HPI:  Currently meds for DM include - lantus 80 units qam, Humalog 20 units with largest meal / supper, Tradgenta 5mg  daily and glimepiride 4mg  BID.  Insulin regimen was last adjusted 03/2013.    Patient is unable to take metformin due to elevated Scr.    Polyuria:  denies  Polydipsia:  denies Polyphagia:  denies  BMI:  Body mass index is 41.7 kg/(m^2).    General Appearance:  obese Mood/Affect:  normal   Low fat/carbohydrate diet?  No - low vegetable and fruit intake Nicotine Abuse?  No Medication Compliance?  Yes Exercise?  No Alcohol Abuse?  Yes  Home BG Monitoring:  Checking 1 times a day. Average:  204  High: 266  Low:  65    Lab Results  Component Value Date   HGBA1C 10.9 01/30/2013    Previous A1C was 12.3% (09/2012)   No results found for this basename: Concepcion Elk    Lab Results  Component Value Date   LDLCALC 116* 01/30/2013   TRIG 194* 01/30/2013      Assessment: 1.  Diabetes.  Uncontrolled but improving 2.  Blood Pressure.    controlled 3.  Lipids.  uncontrolled  Recommendations: 1.  Medication recommendations at this time are as follows:   Increase lantus to 45 units BID  Continue Humalog to 20 units with largest meal.  2.  Reviewed HBG goals:  Fasting 80-130 and 1-2 hour post prandial <180.  Patient is instructed to check BG 1-2 times per day.    3.  BP goal < 140/80. 4.  LDL goal of < 100, HDL > 40 and TG < 150. 5.  Eye Exam yearly and Dental Exam every 6 months. 6.  Dietary recommendations:  Limit calorie, carbohydrate and saturated fat intake.  Increase intake of fresh fruits and vegetables 7.  Physical Activity recommendations:  Start with 10 minutes and work up to 30 minutes daily as tolerated  8.  Return to  clinic in 4-6 wks  Standing order for recheck Thyroid panel was drawn today - pending   Time spent counseling patient:  30 minutes

## 2013-04-20 ENCOUNTER — Other Ambulatory Visit: Payer: Self-pay | Admitting: *Deleted

## 2013-04-20 DIAGNOSIS — E039 Hypothyroidism, unspecified: Secondary | ICD-10-CM

## 2013-04-20 MED ORDER — LEVOTHYROXINE SODIUM 100 MCG PO TABS: 100.0000 ug | ORAL_TABLET | Freq: Every day | ORAL | Status: DC

## 2013-05-04 DIAGNOSIS — Z95810 Presence of automatic (implantable) cardiac defibrillator: Secondary | ICD-10-CM

## 2013-05-04 HISTORY — DX: Presence of automatic (implantable) cardiac defibrillator: Z95.810

## 2013-05-16 ENCOUNTER — Encounter: Payer: Self-pay | Admitting: Internal Medicine

## 2013-05-16 ENCOUNTER — Encounter: Payer: Self-pay | Admitting: *Deleted

## 2013-05-16 ENCOUNTER — Ambulatory Visit (INDEPENDENT_AMBULATORY_CARE_PROVIDER_SITE_OTHER): Payer: BC Managed Care – PPO | Admitting: Internal Medicine

## 2013-05-16 ENCOUNTER — Encounter (HOSPITAL_COMMUNITY): Payer: Self-pay | Admitting: Pharmacy Technician

## 2013-05-16 VITALS — BP 127/86 | HR 85 | Ht 73.0 in | Wt 315.2 lb

## 2013-05-16 DIAGNOSIS — I442 Atrioventricular block, complete: Secondary | ICD-10-CM

## 2013-05-16 DIAGNOSIS — I472 Ventricular tachycardia, unspecified: Secondary | ICD-10-CM

## 2013-05-16 DIAGNOSIS — Z95810 Presence of automatic (implantable) cardiac defibrillator: Secondary | ICD-10-CM

## 2013-05-16 DIAGNOSIS — I219 Acute myocardial infarction, unspecified: Secondary | ICD-10-CM

## 2013-05-16 DIAGNOSIS — I5022 Chronic systolic (congestive) heart failure: Secondary | ICD-10-CM

## 2013-05-16 LAB — ICD DEVICE OBSERVATION
AL AMPLITUDE: 1.125 mv
AL IMPEDENCE ICD: 437 Ohm
BAMS-0001: 170 {beats}/min
CHARGE TIME: 15.134 s
FVT: 0
LV LEAD IMPEDENCE ICD: 437 Ohm
RV LEAD AMPLITUDE: 6.875 mv
RV LEAD IMPEDENCE ICD: 418 Ohm
TOT-0001: 15
TOT-0002: 2
TOT-0006: 20100521000000
TZAT-0001ATACH: 1
TZAT-0001ATACH: 2
TZAT-0001ATACH: 3
TZAT-0001SLOWVT: 1
TZAT-0001SLOWVT: 2
TZAT-0002ATACH: NEGATIVE
TZAT-0004FASTVT: 8
TZAT-0004FASTVT: 8
TZAT-0004SLOWVT: 8
TZAT-0005FASTVT: 88 pct
TZAT-0005FASTVT: 88 pct
TZAT-0012ATACH: 150 ms
TZAT-0012FASTVT: 200 ms
TZAT-0012FASTVT: 200 ms
TZAT-0012SLOWVT: 200 ms
TZAT-0012SLOWVT: 200 ms
TZAT-0013FASTVT: 2
TZAT-0013FASTVT: 2
TZAT-0013SLOWVT: 6
TZAT-0013SLOWVT: 6
TZAT-0018ATACH: NEGATIVE
TZAT-0018ATACH: NEGATIVE
TZAT-0018ATACH: NEGATIVE
TZAT-0018SLOWVT: NEGATIVE
TZAT-0018SLOWVT: NEGATIVE
TZAT-0019ATACH: 6 V
TZAT-0019ATACH: 6 V
TZAT-0019SLOWVT: 8 V
TZAT-0019SLOWVT: 8 V
TZAT-0020FASTVT: 1.5 ms
TZAT-0020SLOWVT: 1.5 ms
TZAT-0020SLOWVT: 1.5 ms
TZON-0003SLOWVT: 500 ms
TZON-0004SLOWVT: 52
TZON-0004VSLOWVT: 20
TZON-0005SLOWVT: 12
TZST-0001ATACH: 5
TZST-0001ATACH: 6
TZST-0001FASTVT: 3
TZST-0001FASTVT: 5
TZST-0001FASTVT: 6
TZST-0001SLOWVT: 3
TZST-0001SLOWVT: 6
TZST-0002ATACH: NEGATIVE
TZST-0002ATACH: NEGATIVE
TZST-0002SLOWVT: NEGATIVE
TZST-0002SLOWVT: NEGATIVE
TZST-0003FASTVT: 35 J
TZST-0003FASTVT: 35 J

## 2013-05-16 LAB — CBC WITH DIFFERENTIAL/PLATELET
Basophils Absolute: 0 10*3/uL (ref 0.0–0.1)
Eosinophils Absolute: 0.4 10*3/uL (ref 0.0–0.7)
Hemoglobin: 14.6 g/dL (ref 13.0–17.0)
Lymphocytes Relative: 20.9 % (ref 12.0–46.0)
MCHC: 33.5 g/dL (ref 30.0–36.0)
MCV: 93.1 fl (ref 78.0–100.0)
Monocytes Absolute: 0.8 10*3/uL (ref 0.1–1.0)
Neutro Abs: 4.5 10*3/uL (ref 1.4–7.7)
RDW: 13.6 % (ref 11.5–14.6)

## 2013-05-16 LAB — BASIC METABOLIC PANEL
CO2: 27 mEq/L (ref 19–32)
Calcium: 9 mg/dL (ref 8.4–10.5)
Creatinine, Ser: 1.5 mg/dL (ref 0.4–1.5)
Glucose, Bld: 170 mg/dL — ABNORMAL HIGH (ref 70–99)

## 2013-05-16 MED ORDER — MEXILETINE HCL 150 MG PO CAPS: 150.0000 mg | ORAL_CAPSULE | Freq: Three times a day (TID) | ORAL | Status: DC

## 2013-05-16 MED ORDER — CARVEDILOL 25 MG PO TABS: 25.0000 mg | ORAL_TABLET | Freq: Two times a day (BID) | ORAL | Status: DC

## 2013-05-16 MED ORDER — AMIODARONE HCL 400 MG PO TABS: ORAL_TABLET | ORAL | Status: DC

## 2013-05-16 NOTE — Assessment & Plan Note (Signed)
He has both chronic systolic heart failure and hypertrophic cardiomyopathy. His symptoms are class III. No change in medical therapy.

## 2013-05-16 NOTE — Progress Notes (Signed)
HPI [name] returns today for followup. He is a very pleasant middle-age man with a hypertrophic cardiomyopathy, chronic systolic heart failure, complete heart block, and ventricular tachycardia. He has been on amiodarone and mexiletine. His ventricular tachycardia has been fairly well-controlled with a one episode of ICD shock in the last 9 months. His heart failure remains class III. The patient denies syncope. He has mild peripheral edema. He is now disabled. He denies chest pain. Allergies  Allergen Reactions  . Ace Inhibitors     AVOID ALL  BECAUSE OF HEART DISEASE   . Mexitil     Pt has heart disease called HYPERTHROPHIC CARDIOMYOPATHY  . Nitroglycerin   . Procan Sr [Procainamide Hcl]     PT. HAS HEART DISEASE CALLED HYPERTHROPHIC CARDIOMYOPATHY  . Quinidine     PT. HAS HEART DISEASE CALLED HYPERTHROPHIC CARDIOMYOPATHY       Current Outpatient Prescriptions  Medication Sig Dispense Refill  . amiodarone (PACERONE) 400 MG tablet Take 1/2 on Sun and one tablet all other days      . aspirin 325 MG tablet Take 325 mg by mouth daily.        . carvedilol (COREG) 25 MG tablet TAKE 1 TABLET TWICE A DAY  180 tablet  1  . cholecalciferol (VITAMIN D) 1000 UNITS tablet Take 1,000 Units by mouth daily. 2000 mg daily      . colesevelam (WELCHOL) 625 MG tablet Take 1,875 mg by mouth 3 (three) times daily.       . fish oil-omega-3 fatty acids 1000 MG capsule Take 3 capsules by mouth daily.        . furosemide (LASIX) 40 MG tablet Take 40 mg by mouth daily.        . glimepiride (AMARYL) 4 MG tablet Take 4 mg by mouth 2 (two) times daily.       . insulin glargine (LANTUS) 100 UNIT/ML injection Inject 0.45 mLs (45 Units total) into the skin 2 (two) times daily.  90 mL  1  . insulin lispro (HUMALOG KWIKPEN) 100 unit/mL SOLN Inject 20 units with largest/evening meal Dispense quikpens  30 mL  1  . levothyroxine (SYNTHROID, LEVOTHROID) 100 MCG tablet Take 1 tablet (100 mcg total) by mouth daily.  90  tablet  0  . linagliptin (TRADJENTA) 5 MG TABS tablet Take 1 tablet (5 mg total) by mouth daily.  28 tablet  0  . lisinopril (PRINIVIL,ZESTRIL) 2.5 MG tablet TAKE 1 TABLET TWICE A DAY  180 tablet  2  . mexiletine (MEXITIL) 150 MG capsule Take 1 capsule (150 mg total) by mouth every 8 (eight) hours.  270 capsule  3  . rosuvastatin (CRESTOR) 20 MG tablet Take 0.5 tablets (10 mg total) by mouth daily.  21 tablet  0   No current facility-administered medications for this visit.     Past Medical History  Diagnosis Date  . DM   . HYPERLIPIDEMIA-MIXED   . Obesity, unspecified   . HYPERTENSION, UNSPECIFIED   . MYOCARDIAL INFARCTION     LAD 50% to 60% stenosis, OM 150% stenosis, OM 240% stenosis, right coronary artery 40% stenosis, 11/12  . Hypertrophic obstructive cardiomyopathy     Poor acoustic windows on echo 2010  . CARDIOMYOPATHY, SECONDARY   . AV BLOCK, COMPLETE   . RENAL INSUFFICIENCY   . AUTOMATIC IMPLANTABLE CARDIAC DEFIBRILLATOR SITU   . Blockage of coronary artery of heart   . BPH (benign prostatic hypertrophy)   . Colon polyps   .   Ventricular tachycardia     ROS:   All systems reviewed and negative except as noted in the HPI.   Past Surgical History  Procedure Laterality Date  . Pacemaker insertion      medtronic  . Cardiac catheterization    . Facial reconstruction surgery      after trauma in 2006     Family History  Problem Relation Age of Onset  . Diabetic kidney disease Father   . Coronary artery disease Neg Hx      History   Social History  . Marital Status: Married    Spouse Name: N/A    Number of Children: N/A  . Years of Education: N/A   Occupational History  . Not on file.   Social History Main Topics  . Smoking status: Former Smoker    Types: Cigarettes    Quit date: 10/04/1968  . Smokeless tobacco: Never Used  . Alcohol Use: Yes     Comment: rarely  . Drug Use: No  . Sexual Activity: Not on file   Other Topics Concern  . Not on  file   Social History Narrative  . No narrative on file     BP 127/86  Pulse 85  Ht 6' 1" (1.854 m)  Wt 315 lb 3.2 oz (142.974 kg)  BMI 41.59 kg/m2  Physical Exam:  stable appearing middle-aged man,NAD HEENT: Unremarkable Neck:  7 cm JVD, no thyromegally Back:  No CVA tenderness Lungs:  Clear with no wheezes, rales, or rhonchi. HEART:  Regular rate rhythm, no murmurs, no rubs, no clicks Abd:  soft, positive bowel sounds, no organomegally, no rebound, no guarding Ext:  2 plus pulses, no edema, no cyanosis, no clubbing Skin:  No rashes no nodules Neuro:  CN II through XII intact, motor grossly intact  EKG - normal sinus rhythm with AV sequential biventricular pacing  DEVICE  Normal device function.  See PaceArt for details. Device at elective replacement  Assess/Plan: 

## 2013-05-16 NOTE — Assessment & Plan Note (Signed)
He will continue amiodarone and mexiletine for control of his malignant ventricular arrhythmias.

## 2013-05-16 NOTE — Patient Instructions (Signed)
Will need to have a generator change done   See instruction sheet

## 2013-05-16 NOTE — Assessment & Plan Note (Addendum)
His Medtronic device has reached elective replacement. We'll schedule ICD removal and insertion of a new device.

## 2013-05-17 ENCOUNTER — Other Ambulatory Visit: Payer: Self-pay | Admitting: *Deleted

## 2013-05-17 DIAGNOSIS — I472 Ventricular tachycardia: Secondary | ICD-10-CM

## 2013-05-18 ENCOUNTER — Encounter: Payer: Self-pay | Admitting: Internal Medicine

## 2013-05-24 ENCOUNTER — Ambulatory Visit (INDEPENDENT_AMBULATORY_CARE_PROVIDER_SITE_OTHER): Payer: BC Managed Care – PPO | Admitting: Pharmacist

## 2013-05-24 VITALS — BP 112/72 | HR 80 | Ht 73.0 in | Wt 316.0 lb

## 2013-05-24 DIAGNOSIS — N189 Chronic kidney disease, unspecified: Secondary | ICD-10-CM

## 2013-05-24 DIAGNOSIS — E785 Hyperlipidemia, unspecified: Secondary | ICD-10-CM

## 2013-05-24 DIAGNOSIS — E119 Type 2 diabetes mellitus without complications: Secondary | ICD-10-CM

## 2013-05-24 DIAGNOSIS — E039 Hypothyroidism, unspecified: Secondary | ICD-10-CM

## 2013-05-24 MED ORDER — INSULIN GLARGINE 100 UNIT/ML ~~LOC~~ SOLN: 50.0000 [IU] | Freq: Two times a day (BID) | SUBCUTANEOUS | Status: DC

## 2013-05-24 MED ORDER — INSULIN LISPRO 100 UNIT/ML (KWIKPEN): PEN_INJECTOR | SUBCUTANEOUS | Status: DC

## 2013-05-24 MED ORDER — DEXTROSE 5 % IV SOLN
3.0000 g | INTRAVENOUS | Status: DC
  Filled 2013-05-24: qty 3000

## 2013-05-24 MED ORDER — GENTAMICIN SULFATE 40 MG/ML IJ SOLN
80.0000 mg | INTRAMUSCULAR | Status: DC
  Filled 2013-05-24: qty 2

## 2013-05-24 NOTE — Progress Notes (Signed)
Diabetes Follow-Up Visit Chief Complaint:   No chief complaint on file.    Filed Vitals:   05/24/13 0827  BP: 112/72  Pulse: 80   Filed Weights   05/24/13 0827  Weight: 316 lb (143.337 kg)     HPI:  Currently meds for DM include - lantus 45 units bid, Humalog 20 units with largest meal / supper, Tradgenta 5mg  daily and glimepiride 4mg  BID.  Insulin regimen was last adjusted 04/2013.    Patient is unable to take metformin due to elevated Scr.    Polyuria:  denies  Polydipsia:  denies Polyphagia:  denies  BMI:  Body mass index is 41.7 kg/(m^2).    General Appearance:  obese Mood/Affect:  normal   Low fat/carbohydrate diet?  No - but has tried to increase non-starchy vegetables recently and limit CHO intake Nicotine Abuse?  No Medication Compliance?  Yes Exercise?  No - patient is going in tomorrow to have defibrillator replaced.  Has not been exercising recently due to concerns about heart rate Alcohol Abuse?  Yes  Home BG Monitoring:  Checking 1 time a day. Average:  183  High: 229  Low:  77 (occurred at 4:30am - woke up sweating and "nervous")    Lab Results  Component Value Date   HGBA1C 9.2 05/24/2013    Previous A1C was 10.9% (01/2013) and 12.3% (09/2012)   No results found for this basename: Concepcion Elk    Lab Results  Component Value Date   LDLCALC 116* 01/30/2013   TRIG 194* 01/30/2013      Assessment: 1.  Diabetes.  Uncontrolled but improving 2.  Blood Pressure.    controlled 3.  Lipids.  Uncontrolled - labs drawn today - pending  Recommendations: 1.  Medication recommendations at this time are as follows:   Increase lantus to 50 units BID  Continue Humalog to 20 units with largest meal, glimepiride 4mg  bid and tradgenta 5mg  daily 2.  Reviewed HBG goals:  Fasting 80-130 and 1-2 hour post prandial <180.  Patient is instructed to check BG 1-2 times per day.    3.  BP goal < 140/80. 4.  LDL goal of < 100, HDL > 40 and TG < 150. 5.  Eye Exam  yearly and Dental Exam every 6 months. 6.  Dietary recommendations:  Limit calorie, carbohydrate and saturated fat intake.  Increase intake of fresh fruits and vegetables 7.  Physical Activity recommendations:  After defibrillator replaced and cleared with cardiologist, start with 10 minutes and work up to 30 minutes daily as tolerated  8.  Return to clinic in 4-6 wks  Orders Placed This Encounter  Procedures  . BMP8+EGFR  . NMR, lipoprofile  . Hepatic function panel  . Thyroid Panel With TSH    Time spent counseling patient:  30 minutes   Henrene Pastor, PharmD, CPP

## 2013-05-25 ENCOUNTER — Ambulatory Visit (HOSPITAL_COMMUNITY)
Admission: RE | Admit: 2013-05-25 | Discharge: 2013-05-25 | Disposition: A | Discharge status: Home / Self Care | Payer: BC Managed Care – PPO | Source: Ambulatory Visit | Attending: Internal Medicine | Admitting: Internal Medicine

## 2013-05-25 ENCOUNTER — Encounter (HOSPITAL_COMMUNITY)
Admission: RE | Disposition: A | Discharge status: Home / Self Care | Payer: Self-pay | Source: Ambulatory Visit | Attending: Internal Medicine

## 2013-05-25 DIAGNOSIS — Z4502 Encounter for adjustment and management of automatic implantable cardiac defibrillator: Secondary | ICD-10-CM | POA: Insufficient documentation

## 2013-05-25 DIAGNOSIS — Z7982 Long term (current) use of aspirin: Secondary | ICD-10-CM | POA: Insufficient documentation

## 2013-05-25 DIAGNOSIS — N289 Disorder of kidney and ureter, unspecified: Secondary | ICD-10-CM | POA: Insufficient documentation

## 2013-05-25 DIAGNOSIS — Z87891 Personal history of nicotine dependence: Secondary | ICD-10-CM | POA: Insufficient documentation

## 2013-05-25 DIAGNOSIS — Z79899 Other long term (current) drug therapy: Secondary | ICD-10-CM | POA: Insufficient documentation

## 2013-05-25 DIAGNOSIS — I472 Ventricular tachycardia, unspecified: Secondary | ICD-10-CM | POA: Insufficient documentation

## 2013-05-25 DIAGNOSIS — Z6841 Body Mass Index (BMI) 40.0 and over, adult: Secondary | ICD-10-CM | POA: Insufficient documentation

## 2013-05-25 DIAGNOSIS — E782 Mixed hyperlipidemia: Secondary | ICD-10-CM | POA: Insufficient documentation

## 2013-05-25 DIAGNOSIS — E119 Type 2 diabetes mellitus without complications: Secondary | ICD-10-CM | POA: Insufficient documentation

## 2013-05-25 DIAGNOSIS — I442 Atrioventricular block, complete: Secondary | ICD-10-CM | POA: Insufficient documentation

## 2013-05-25 DIAGNOSIS — I421 Obstructive hypertrophic cardiomyopathy: Secondary | ICD-10-CM | POA: Insufficient documentation

## 2013-05-25 DIAGNOSIS — I5022 Chronic systolic (congestive) heart failure: Secondary | ICD-10-CM | POA: Insufficient documentation

## 2013-05-25 DIAGNOSIS — E669 Obesity, unspecified: Secondary | ICD-10-CM | POA: Insufficient documentation

## 2013-05-25 DIAGNOSIS — I4729 Other ventricular tachycardia: Secondary | ICD-10-CM | POA: Insufficient documentation

## 2013-05-25 DIAGNOSIS — I252 Old myocardial infarction: Secondary | ICD-10-CM | POA: Insufficient documentation

## 2013-05-25 DIAGNOSIS — Z794 Long term (current) use of insulin: Secondary | ICD-10-CM | POA: Insufficient documentation

## 2013-05-25 DIAGNOSIS — I509 Heart failure, unspecified: Secondary | ICD-10-CM | POA: Insufficient documentation

## 2013-05-25 DIAGNOSIS — N400 Enlarged prostate without lower urinary tract symptoms: Secondary | ICD-10-CM | POA: Insufficient documentation

## 2013-05-25 DIAGNOSIS — Z888 Allergy status to other drugs, medicaments and biological substances status: Secondary | ICD-10-CM | POA: Insufficient documentation

## 2013-05-25 DIAGNOSIS — I10 Essential (primary) hypertension: Secondary | ICD-10-CM | POA: Insufficient documentation

## 2013-05-25 HISTORY — PX: BI-VENTRICULAR IMPLANTABLE CARDIOVERTER DEFIBRILLATOR: SHX5459

## 2013-05-25 LAB — BMP8+EGFR
BUN/Creatinine Ratio: 13 (ref 9–20)
BUN: 22 mg/dL (ref 6–24)
Chloride: 101 mmol/L (ref 97–108)
Creatinine, Ser: 1.72 mg/dL — ABNORMAL HIGH (ref 0.76–1.27)
GFR calc Af Amer: 50 mL/min/{1.73_m2} — ABNORMAL LOW (ref >59)

## 2013-05-25 LAB — HEPATIC FUNCTION PANEL
ALT: 131 IU/L — ABNORMAL HIGH (ref 0–44)
AST: 259 IU/L — ABNORMAL HIGH (ref 0–40)
Alkaline Phosphatase: 146 IU/L — ABNORMAL HIGH (ref 39–117)
Bilirubin, Direct: 0.2 mg/dL (ref 0.00–0.40)

## 2013-05-25 LAB — SURGICAL PCR SCREEN: MRSA, PCR: NEGATIVE

## 2013-05-25 LAB — GLUCOSE, CAPILLARY: Glucose-Capillary: 189 mg/dL — ABNORMAL HIGH (ref 70–99)

## 2013-05-25 LAB — THYROID PANEL WITH TSH: TSH: 15.5 u[IU]/mL — ABNORMAL HIGH (ref 0.450–4.500)

## 2013-05-25 LAB — NMR, LIPOPROFILE
Cholesterol: 169 mg/dL (ref <200)
HDL Cholesterol by NMR: 44 mg/dL (ref >=40)
HDL Particle Number: 27.1 umol/L — ABNORMAL LOW (ref >=30.5)
LDLC SERPL CALC-MCNC: 77 mg/dL (ref <100)

## 2013-05-25 SURGERY — BI-VENTRICULAR IMPLANTABLE CARDIOVERTER DEFIBRILLATOR  (CRT-D)
Anesthesia: LOCAL

## 2013-05-25 MED ORDER — FENTANYL CITRATE 0.05 MG/ML IJ SOLN
INTRAMUSCULAR | Status: AC
  Filled 2013-05-25: qty 2

## 2013-05-25 MED ORDER — ONDANSETRON HCL 4 MG/2ML IJ SOLN: 4.0000 mg | Freq: Four times a day (QID) | INTRAMUSCULAR | Status: DC | PRN

## 2013-05-25 MED ORDER — MUPIROCIN 2 % EX OINT
TOPICAL_OINTMENT | CUTANEOUS | Status: AC
  Administered 2013-05-25: 1
  Filled 2013-05-25: qty 22

## 2013-05-25 MED ORDER — LIDOCAINE HCL (PF) 1 % IJ SOLN
INTRAMUSCULAR | Status: AC
  Filled 2013-05-25: qty 60

## 2013-05-25 MED ORDER — BUPIVACAINE HCL (PF) 0.25 % IJ SOLN
INTRAMUSCULAR | Status: AC
  Filled 2013-05-25: qty 60

## 2013-05-25 MED ORDER — SODIUM CHLORIDE 0.9 % IV SOLN
INTRAVENOUS | Status: DC
  Administered 2013-05-25: 11:00:00 via INTRAVENOUS

## 2013-05-25 MED ORDER — ACETAMINOPHEN 325 MG PO TABS: 325.0000 mg | ORAL_TABLET | ORAL | Status: DC | PRN

## 2013-05-25 MED ORDER — MIDAZOLAM HCL 5 MG/5ML IJ SOLN
INTRAMUSCULAR | Status: AC
  Filled 2013-05-25: qty 5

## 2013-05-25 NOTE — CV Procedure (Signed)
BiV ICD generator removal and insertion of a new BiV ICD without immediate complication. W#[id number].

## 2013-05-25 NOTE — Op Note (Signed)
NAMECHRISTORPHER, [name] NO.:  [id number]  MEDICAL RECORD NO.:  [id number]  LOCATION:  [facility]                         FACILITY:  [facility]  PHYSICIAN:  Doylene Canning. [name], MD    DATE OF BIRTH:  [birth year]  DATE OF PROCEDURE:  05/25/2013 DATE OF DISCHARGE:                              OPERATIVE REPORT   PROCEDURE PERFORMED:  Removal of previously implanted ICD which had reached elective replacement and insertion of a new ICD with ICD pocket revision.  INTRODUCTION:  The patient is a very pleasant 57 year old male with complete heart block and ventricular tachycardia and a nonischemic cardiomyopathy with chronic systolic heart failure, class III.  He is status post BiV ICD insertion and has reached elective replacement.  PROCEDURE:  After informed consent was obtained, the patient was taken to the diagnostic EP lab in a fasting state.  After usual preparation and draping, intravenous fentanyl and midazolam was given for sedation. 30 mL of lidocaine was infiltrated into the left infraclavicular region. A 7-cm incision was carried out over this region and electrocautery was utilized to dissect down to the ICD pocket.  Electrocautery was utilized to free up the very dense fibrous adhesions.  Having accomplished this, the leads were evaluated and found to be working satisfactorily.  The old Medtronic Concerto II was removed and the Medtronic Viva XT CRT-D Bi V ICD, serial number [id number] was connected to the leads.  The pocket was revised to accommodate the different shape and free up the scar tissue in the pocket.  The pocket was irrigated with antibiotic irrigation and the incision was closed with 2-0 and 3-0 Vicryl.  Benzoin and Steri-Strips were painted on the skin.  A pressure dressing was applied.  The patient was returned to his room in satisfactory condition.  COMPLICATIONS:  There were no immediate procedure complications.  RESULTS:  This demonstrates successful  removal of previously implanted Medtronic BiV ICD and insertion of a new BiV ICD with ICD pocket revision.     Doylene Canning. [name], MD     GWT/MEDQ  D:  05/25/2013  T:  05/25/2013  Job:  [id number]

## 2013-05-25 NOTE — Interval H&P Note (Signed)
History and Physical Interval Note:  05/25/2013 12:54 PM  Dakota Brook Sr.  has presented today for surgery, with the diagnosis of eri/vt  The various methods of treatment have been discussed with the patient and family. After consideration of risks, benefits and other options for treatment, the patient has consented to  Procedure(s): BI-VENTRICULAR IMPLANTABLE CARDIOVERTER DEFIBRILLATOR  (CRT-D) (N/A) as a surgical intervention .  The patient's history has been reviewed, patient examined, no change in status, stable for surgery.  I have reviewed the patient's chart and labs.  Questions were answered to the patient's satisfaction.     Leonia Reeves.D.

## 2013-05-25 NOTE — H&P (Signed)
  ICD Criteria  Current LVEF (within 6 months):25%  NYHA Functional Classification: Class III  Heart Failure History:  Yes, Duration of heart failure since onset is > 9 months  Non-Ischemic Dilated Cardiomyopathy History:  Yes, timeframe is > 9 months  Atrial Fibrillation/Atrial Flutter:  no Ventricular Tachycardia History:  Yes, Hemodynamic instability present, VT Type:  SVT - Monomorphic.  Cardiac Arrest History:  No  History of Syndromes with Risk of Sudden Death:  No.  Previous ICD:  Yes, ICD Type:  CRT-D, Reason for ICD:  Secondary, reason for secondary prevention:  Syncope with Inducible VT.  Electrophysiology Study: Inducible ventricular tachycardia, ablation performed  Anticoagulation Therapy:  Patient is NOT on anticoagulation therapy.   Beta Blocker Therapy:  Yes.   Ace Inhibitor/ARB Therapy:  No, allergic

## 2013-05-25 NOTE — H&P (View-Only) (Signed)
HPI [name] returns today for followup. He is a very pleasant middle-age man with a hypertrophic cardiomyopathy, chronic systolic heart failure, complete heart block, and ventricular tachycardia. He has been on amiodarone and mexiletine. His ventricular tachycardia has been fairly well-controlled with a one episode of ICD shock in the last 9 months. His heart failure remains class III. The patient denies syncope. He has mild peripheral edema. He is now disabled. He denies chest pain. Allergies  Allergen Reactions  . Ace Inhibitors     AVOID ALL  BECAUSE OF HEART DISEASE   . Mexitil     Pt has heart disease called HYPERTHROPHIC CARDIOMYOPATHY  . Nitroglycerin   . Procan Sr [Procainamide Hcl]     PT. HAS HEART DISEASE CALLED HYPERTHROPHIC CARDIOMYOPATHY  . Quinidine     PT. HAS HEART DISEASE CALLED HYPERTHROPHIC CARDIOMYOPATHY       Current Outpatient Prescriptions  Medication Sig Dispense Refill  . amiodarone (PACERONE) 400 MG tablet Take 1/2 on Sun and one tablet all other days      . aspirin 325 MG tablet Take 325 mg by mouth daily.        . carvedilol (COREG) 25 MG tablet TAKE 1 TABLET TWICE A DAY  180 tablet  1  . cholecalciferol (VITAMIN D) 1000 UNITS tablet Take 1,000 Units by mouth daily. 2000 mg daily      . colesevelam (WELCHOL) 625 MG tablet Take 1,875 mg by mouth 3 (three) times daily.       . fish oil-omega-3 fatty acids 1000 MG capsule Take 3 capsules by mouth daily.        . furosemide (LASIX) 40 MG tablet Take 40 mg by mouth daily.        [name] glimepiride (AMARYL) 4 MG tablet Take 4 mg by mouth 2 (two) times daily.       . insulin glargine (LANTUS) 100 UNIT/ML injection Inject 0.45 mLs (45 Units total) into the skin 2 (two) times daily.  90 mL  1  . insulin lispro (HUMALOG KWIKPEN) 100 unit/mL SOLN Inject 20 units with largest/evening meal Dispense quikpens  30 mL  1  . levothyroxine (SYNTHROID, LEVOTHROID) 100 MCG tablet Take 1 tablet (100 mcg total) by mouth daily.  90  tablet  0  . linagliptin (TRADJENTA) 5 MG TABS tablet Take 1 tablet (5 mg total) by mouth daily.  28 tablet  0  . lisinopril (PRINIVIL,ZESTRIL) 2.5 MG tablet TAKE 1 TABLET TWICE A DAY  180 tablet  2  . mexiletine (MEXITIL) 150 MG capsule Take 1 capsule (150 mg total) by mouth every 8 (eight) hours.  270 capsule  3  . rosuvastatin (CRESTOR) 20 MG tablet Take 0.5 tablets (10 mg total) by mouth daily.  21 tablet  0   No current facility-administered medications for this visit.     Past Medical History  Diagnosis Date  . DM   . HYPERLIPIDEMIA-MIXED   . Obesity, unspecified   . HYPERTENSION, UNSPECIFIED   . MYOCARDIAL INFARCTION     LAD 50% to 60% stenosis, OM 150% stenosis, OM 240% stenosis, right coronary artery 40% stenosis, 11/12  . Hypertrophic obstructive cardiomyopathy     Poor acoustic windows on echo 2010  . CARDIOMYOPATHY, SECONDARY   . AV BLOCK, COMPLETE   . RENAL INSUFFICIENCY   . AUTOMATIC IMPLANTABLE CARDIAC DEFIBRILLATOR SITU   . Blockage of coronary artery of heart   . BPH (benign prostatic hypertrophy)   . Colon polyps   .  Ventricular tachycardia     ROS:   All systems reviewed and negative except as noted in the HPI.   Past Surgical History  Procedure Laterality Date  . Pacemaker insertion      medtronic  . Cardiac catheterization    . Facial reconstruction surgery      after trauma in 2006     Family History  Problem Relation Age of Onset  . Diabetic kidney disease Father   . Coronary artery disease Neg Hx      History   Social History  . Marital Status: Married    Spouse Name: N/A    Number of Children: N/A  . Years of Education: N/A   Occupational History  . Not on file.   Social History Main Topics  . Smoking status: Former Smoker    Types: Cigarettes    Quit date: 10/04/1968  . Smokeless tobacco: Never Used  . Alcohol Use: Yes     Comment: rarely  . Drug Use: No  . Sexual Activity: Not on file   Other Topics Concern  . Not on  file   Social History Narrative  . No narrative on file     BP 127/86  Pulse 85  Ht 6\' 1"  (1.854 m)  Wt 315 lb 3.2 oz (142.974 kg)  BMI 41.59 kg/m2  Physical Exam:  stable appearing middle-aged man,NAD HEENT: Unremarkable Neck:  7 cm JVD, no thyromegally Back:  No CVA tenderness Lungs:  Clear with no wheezes, rales, or rhonchi. HEART:  Regular rate rhythm, no murmurs, no rubs, no clicks Abd:  soft, positive bowel sounds, no organomegally, no rebound, no guarding Ext:  2 plus pulses, no edema, no cyanosis, no clubbing Skin:  No rashes no nodules Neuro:  CN II through XII intact, motor grossly intact  EKG - normal sinus rhythm with AV sequential biventricular pacing  DEVICE  Normal device function.  See PaceArt for details. Device at elective replacement  Assess/Plan:

## 2013-05-28 ENCOUNTER — Telehealth: Payer: Self-pay | Admitting: Pharmacist

## 2013-05-28 ENCOUNTER — Other Ambulatory Visit: Payer: Self-pay | Admitting: *Deleted

## 2013-05-28 DIAGNOSIS — E039 Hypothyroidism, unspecified: Secondary | ICD-10-CM

## 2013-05-28 MED ORDER — LEVOTHYROXINE SODIUM 25 MCG PO TABS: 25.0000 ug | ORAL_TABLET | Freq: Every day | ORAL | Status: DC

## 2013-05-28 MED ORDER — AMIODARONE HCL 400 MG PO TABS: ORAL_TABLET | ORAL | Status: DC

## 2013-05-28 NOTE — Telephone Encounter (Signed)
Called patient and his wife with lab results.  They understand recommendations.  Rx for levothyroine to take with to equalt total was called to [facility]. Also made referral for abdominal ultrasound at [facility].

## 2013-06-07 ENCOUNTER — Ambulatory Visit (INDEPENDENT_AMBULATORY_CARE_PROVIDER_SITE_OTHER): Payer: BC Managed Care – PPO | Admitting: *Deleted

## 2013-06-07 DIAGNOSIS — I5022 Chronic systolic (congestive) heart failure: Secondary | ICD-10-CM

## 2013-06-07 DIAGNOSIS — Z95810 Presence of automatic (implantable) cardiac defibrillator: Secondary | ICD-10-CM

## 2013-06-07 DIAGNOSIS — I472 Ventricular tachycardia: Secondary | ICD-10-CM

## 2013-06-07 DIAGNOSIS — I442 Atrioventricular block, complete: Secondary | ICD-10-CM

## 2013-06-07 LAB — ICD DEVICE OBSERVATION
AL THRESHOLD: 0.75 V
ATRIAL PACING ICD: 0.3 pct
LV LEAD THRESHOLD: 0.5 V
RV LEAD THRESHOLD: 1 V
TZON-0003FASTVT: 350.8 ms

## 2013-06-07 NOTE — Progress Notes (Signed)
Pt seen in device clinic for follow up of recently replaced CRT-D.  Redness & swelling present; pt states it has been same since [employer]. He does not c/o of fever. Steri-strips removed prior to arrival. 1 suture removed underneath scab. 1 minor wound from bandage (removal prior to arrival). Pt instructed to call us if fever, chills, increased redness, or more swelling occurs.  Device interrogated and found to be functioning normally.  No changes made today. See PaceArt for full details.  Pt to follow up with Dr. [name] 09/11/13.  [name] 06/07/2013 10:46 AM

## 2013-06-11 ENCOUNTER — Telehealth: Payer: Self-pay | Admitting: Pharmacist

## 2013-06-11 ENCOUNTER — Ambulatory Visit (HOSPITAL_COMMUNITY)
Admission: RE | Admit: 2013-06-11 | Discharge: 2013-06-11 | Disposition: A | Discharge status: Home / Self Care | Payer: BC Managed Care – PPO | Source: Ambulatory Visit | Attending: Pharmacist | Admitting: Pharmacist

## 2013-06-11 DIAGNOSIS — E785 Hyperlipidemia, unspecified: Secondary | ICD-10-CM | POA: Insufficient documentation

## 2013-06-11 DIAGNOSIS — N289 Disorder of kidney and ureter, unspecified: Secondary | ICD-10-CM | POA: Insufficient documentation

## 2013-06-11 DIAGNOSIS — I421 Obstructive hypertrophic cardiomyopathy: Secondary | ICD-10-CM | POA: Insufficient documentation

## 2013-06-11 DIAGNOSIS — I10 Essential (primary) hypertension: Secondary | ICD-10-CM | POA: Insufficient documentation

## 2013-06-11 DIAGNOSIS — E119 Type 2 diabetes mellitus without complications: Secondary | ICD-10-CM | POA: Insufficient documentation

## 2013-06-11 DIAGNOSIS — R7989 Other specified abnormal findings of blood chemistry: Secondary | ICD-10-CM | POA: Insufficient documentation

## 2013-06-11 NOTE — Telephone Encounter (Signed)
Patient aware of results of abdominal ultrasound.

## 2013-06-13 ENCOUNTER — Encounter: Payer: Self-pay | Admitting: Family Medicine

## 2013-06-13 ENCOUNTER — Ambulatory Visit (INDEPENDENT_AMBULATORY_CARE_PROVIDER_SITE_OTHER): Payer: BC Managed Care – PPO | Admitting: Family Medicine

## 2013-06-13 VITALS — BP 125/86 | HR 84 | Temp 96.6°F | Ht 73.75 in | Wt 313.0 lb

## 2013-06-13 DIAGNOSIS — E785 Hyperlipidemia, unspecified: Secondary | ICD-10-CM

## 2013-06-13 DIAGNOSIS — K7689 Other specified diseases of liver: Secondary | ICD-10-CM

## 2013-06-13 DIAGNOSIS — I10 Essential (primary) hypertension: Secondary | ICD-10-CM

## 2013-06-13 DIAGNOSIS — K760 Fatty (change of) liver, not elsewhere classified: Secondary | ICD-10-CM

## 2013-06-13 DIAGNOSIS — E119 Type 2 diabetes mellitus without complications: Secondary | ICD-10-CM

## 2013-06-13 DIAGNOSIS — I421 Obstructive hypertrophic cardiomyopathy: Secondary | ICD-10-CM

## 2013-06-13 MED ORDER — LISINOPRIL 2.5 MG PO TABS: 2.5000 mg | ORAL_TABLET | Freq: Two times a day (BID) | ORAL | Status: DC

## 2013-06-13 MED ORDER — GLIMEPIRIDE 4 MG PO TABS: 4.0000 mg | ORAL_TABLET | Freq: Two times a day (BID) | ORAL | Status: DC

## 2013-06-13 NOTE — Patient Instructions (Addendum)
Flu shot in October Continue current medication Continue aggressive therapeutic lifestyle changes Continue to monitor blood sugars closely Followup with cardiologist as planned

## 2013-06-13 NOTE — Progress Notes (Signed)
  Subjective:    Patient ID: Dakota Brook Sr., male    DOB: 02-Apr-1956, 57 y.o.   MRN: [id number]  HPI Patient returns to clinic today with his wife for followup and management of chronic medical problems. These include diabetes, hypertension, hyperlipidemia, mild hypothyroidism, and obstructive hypertrophic cardiomyopathy. He has had a recent hospitalization with an implantable defibrillator. Patient had recent lab work and this has been reviewed with him over the phone. Because of elevated liver function test he got an ultrasound of the abdomen and this basically showed a fatty liver. All lab work it was found that he he was slightly hypothyroid and then later more severely hypothyroid and his levo thyroxine was increased. Blood sugars are doing better the highest being in the 140 range between breakfast and lunch everyday. Outside blood pressures have also been good in the doctor's office.    Review of Systems  Constitutional: Negative.   HENT: Negative.   Eyes: Negative.   Respiratory: Positive for shortness of breath.   Cardiovascular: Negative.   Gastrointestinal: Negative.   Endocrine: Negative.   Genitourinary: Negative.   Musculoskeletal: Negative.   Skin: Negative.   Allergic/Immunologic: Negative.   Neurological: Negative.   Hematological: Negative.   Psychiatric/Behavioral: Negative.        Objective:   Physical Exam BP 125/86  Pulse 84  Temp(Src) 96.6 F (35.9 C) (Oral)  Ht 6' 1.75" (1.873 m)  Wt 313 lb (141.976 kg)  BMI 40.47 kg/m2  The patient appeared overweight, well nourished and normally developed, alert and oriented to time and place. Speech, behavior and judgement appear normal. He has his usual quiet demeanor. Vital signs as documented.  Head exam is unremarkable. No scleral icterus or pallor noted. Ears nose and throat were normal. Neck is without jugular venous distension, thyromegally, or carotid bruits. Carotid upstrokes are brisk bilaterally. No  cervical adenopathy. Lungs are clear anteriorly and posteriorly to auscultation. Normal respiratory effort. Cardiac exam reveals regular rate and rhythm at 72 per minute. First and second heart sounds normal.  No murmurs, rubs or gallops.  Abdominal exam reveals obesity, normal bowl sounds, no masses, no organomegaly and no aortic enlargement. No inguinal adenopathy. Extremities are nonedematous and both femoral and pedal pulses are normal. Skin without pallor or jaundice. Cool and dry, without rash. Neurologic exam reveals normal deep tendon reflexes and normal sensation. Diabetic foot exam was done          Assessment & Plan:  HYPERLIPIDEMIA-MIXED  Diabetes mellitus type 2 controlled  HYPERTENSION, UNSPECIFIED - Plan: CANCELED: DG Chest 2 View  Hypertrophic obstructive cardiomyopathy(425.11) - Plan: CANCELED: DG Chest 2 View  Fatty liver, with elevated liver function tests  Severe obesity (BMI >= 40)   Patient Instructions  Flu shot in October Continue current medication Continue aggressive therapeutic lifestyle changes Continue to monitor blood sugars closely Followup with cardiologist as planned   Nyra Capes MD

## 2013-06-19 ENCOUNTER — Other Ambulatory Visit: Payer: Self-pay | Admitting: *Deleted

## 2013-06-19 DIAGNOSIS — E079 Disorder of thyroid, unspecified: Secondary | ICD-10-CM

## 2013-06-19 DIAGNOSIS — I10 Essential (primary) hypertension: Secondary | ICD-10-CM

## 2013-06-19 DIAGNOSIS — E785 Hyperlipidemia, unspecified: Secondary | ICD-10-CM

## 2013-07-09 ENCOUNTER — Other Ambulatory Visit: Payer: Self-pay | Admitting: Cardiology

## 2013-07-09 ENCOUNTER — Encounter: Payer: Self-pay | Admitting: Internal Medicine

## 2013-07-26 ENCOUNTER — Ambulatory Visit (INDEPENDENT_AMBULATORY_CARE_PROVIDER_SITE_OTHER): Payer: BC Managed Care – PPO | Admitting: Pharmacist

## 2013-07-26 ENCOUNTER — Other Ambulatory Visit: Payer: Self-pay

## 2013-07-26 VITALS — BP 112/68 | HR 70 | Ht 73.75 in | Wt 319.0 lb

## 2013-07-26 DIAGNOSIS — I5022 Chronic systolic (congestive) heart failure: Secondary | ICD-10-CM

## 2013-07-26 DIAGNOSIS — I10 Essential (primary) hypertension: Secondary | ICD-10-CM

## 2013-07-26 DIAGNOSIS — E039 Hypothyroidism, unspecified: Secondary | ICD-10-CM

## 2013-07-26 DIAGNOSIS — I442 Atrioventricular block, complete: Secondary | ICD-10-CM

## 2013-07-26 DIAGNOSIS — E119 Type 2 diabetes mellitus without complications: Secondary | ICD-10-CM

## 2013-07-26 DIAGNOSIS — E785 Hyperlipidemia, unspecified: Secondary | ICD-10-CM

## 2013-07-26 DIAGNOSIS — Z95810 Presence of automatic (implantable) cardiac defibrillator: Secondary | ICD-10-CM

## 2013-07-26 DIAGNOSIS — Z23 Encounter for immunization: Secondary | ICD-10-CM

## 2013-07-26 DIAGNOSIS — N259 Disorder resulting from impaired renal tubular function, unspecified: Secondary | ICD-10-CM

## 2013-07-26 DIAGNOSIS — I472 Ventricular tachycardia: Secondary | ICD-10-CM

## 2013-07-26 DIAGNOSIS — E669 Obesity, unspecified: Secondary | ICD-10-CM

## 2013-07-26 MED ORDER — FUROSEMIDE 40 MG PO TABS: 40.0000 mg | ORAL_TABLET | Freq: Every day | ORAL | Status: DC

## 2013-07-26 MED ORDER — MEXILETINE HCL 150 MG PO CAPS: 150.0000 mg | ORAL_CAPSULE | Freq: Three times a day (TID) | ORAL | Status: DC

## 2013-07-26 NOTE — Progress Notes (Signed)
Diabetes Follow-Up Visit Chief Complaint:   No chief complaint on file.    Filed Vitals:   07/26/13 0836  BP: 112/68  Pulse: 70   Filed Weights   07/26/13 0836  Weight: 319 lb (144.697 kg)     HPI:  Currently meds for DM include - lantus 45 units bid, Humalog 20 units with largest meal / supper, Tradgenta 5mg  daily and glimepiride 4mg  BID.  Insulin regimen was last adjusted 04/2013.    Patient is unable to take metformin due to elevated Scr.    Polyuria:  denies  Polydipsia:  denies Polyphagia:  denies  BMI:  Body mass index is 41.25 kg/(m^2).    General Appearance:  obese Mood/Affect:  normal   Low fat/carbohydrate diet?  No - but has tried to increase non-starchy vegetables recently and limit CHO intake Nicotine Abuse?  No Medication Compliance?  Yes Exercise?  No - patient is going in tomorrow to have defibrillator replaced.  Has not been exercising recently due to concerns about heart rate Alcohol Abuse?  Yes  Home BG Monitoring:  Checking 1 time a day. Average:  115 High: 183 Low:  76     Lab Results  Component Value Date   HGBA1C 7.2 07/26/2013    Previous A1C was 10.9% (01/2013) and 12.3% (09/2012)   No results found for this basenameConcepcion Elk    Lab Results  Component Value Date   CHOL 169 05/24/2013   LDLCALC 116* 01/30/2013   TRIG 194* 01/30/2013      Assessment: 1.  Diabetes.  Much improved - A1c has decrased significantly since the beginning of 2014! 2.  Blood Pressure.    controlled 3.  Lipids.  Uncontrolled - labs drawn today - pending  Recommendations: 1.  Medication recommendations at this time are as follows:   Continue lantus to 50 units BID  Continue Humalog to 20 units with largest meal,  glimepiride 4mg  bid and tradgenta 5mg  daily 2.  Reviewed HBG goals:  Fasting 80-130 and 1-2 hour post prandial <180.  Patient is instructed to check BG 1-2 times per day.    3.  BP goal < 140/80. 4.  LDL goal of < 100, HDL > 40 and TG  < 150. 5.  Eye Exam yearly and Dental Exam every 6 months. 6.  Dietary recommendations:  Continue to limit calorie, carbohydrate and saturated fat intake.  Increase intake of fresh fruits and vegetables 7.  Physical Activity recommendations:  After defibrillator replaced and cleared with cardiologist, start with 10 minutes and work up to 30 minutes daily as tolerated  8.  Return to clinic in 12 wks  Orders Placed This Encounter  Procedures  . NMR, lipoprofile  . CMP14+EGFR  . Thyroid Panel With TSH  . POCT glycosylated hemoglobin (Hb A1C)    Time spent counseling patient:  30 minutes   Henrene Pastor, PharmD, CPP

## 2013-07-28 LAB — CMP14+EGFR
ALT: 69 IU/L — ABNORMAL HIGH (ref 0–44)
AST: 194 IU/L — ABNORMAL HIGH (ref 0–40)
Albumin/Globulin Ratio: 1.1 (ref 1.1–2.5)
Calcium: 8.8 mg/dL (ref 8.7–10.2)
GFR calc non Af Amer: 51 mL/min/{1.73_m2} — ABNORMAL LOW (ref >59)
Potassium: 5 mmol/L (ref 3.5–5.2)
Sodium: 140 mmol/L (ref 134–144)

## 2013-07-28 LAB — NMR, LIPOPROFILE
Cholesterol: 122 mg/dL (ref <200)
HDL Particle Number: 20.8 umol/L — ABNORMAL LOW (ref >=30.5)
LDL Size: 20.8 nm (ref >20.5)
LDLC SERPL CALC-MCNC: 55 mg/dL (ref <100)
LP-IR Score: 46 — ABNORMAL HIGH (ref <=45)
Small LDL Particle Number: 833 nmol/L — ABNORMAL HIGH (ref <=527)

## 2013-07-28 LAB — THYROID PANEL WITH TSH: T3 Uptake Ratio: 33 % (ref 24–39)

## 2013-08-02 ENCOUNTER — Telehealth: Payer: Self-pay | Admitting: Pharmacist

## 2013-08-02 DIAGNOSIS — R748 Abnormal levels of other serum enzymes: Secondary | ICD-10-CM

## 2013-08-02 DIAGNOSIS — E039 Hypothyroidism, unspecified: Secondary | ICD-10-CM

## 2013-08-02 MED ORDER — LEVOTHYROXINE SODIUM 150 MCG PO TABS: 150.0000 ug | ORAL_TABLET | Freq: Every day | ORAL | Status: DC

## 2013-08-02 NOTE — Telephone Encounter (Signed)
Patient's wife notified of results.  

## 2013-08-15 ENCOUNTER — Telehealth: Payer: Self-pay

## 2013-08-15 NOTE — Telephone Encounter (Signed)
Patient called and cancelled his appointment  Did not reschedule   Dr Madilyn Fireman  GI

## 2013-08-24 ENCOUNTER — Telehealth: Payer: Self-pay | Admitting: Family Medicine

## 2013-08-27 MED ORDER — LINAGLIPTIN 5 MG PO TABS: 5.0000 mg | ORAL_TABLET | Freq: Every day | ORAL | Status: DC

## 2013-08-27 NOTE — Telephone Encounter (Signed)
No samples available but patient given discount card. Script sent to pharmacy.

## 2013-08-29 ENCOUNTER — Telehealth: Payer: Self-pay | Admitting: Family Medicine

## 2013-08-31 NOTE — Telephone Encounter (Signed)
We can do Januvia 50 mg once daily. He needs to come by and get a BMP to update his kidney function. #30    1 refill

## 2013-09-03 MED ORDER — SITAGLIPTIN PHOSPHATE 50 MG PO TABS: 50.0000 mg | ORAL_TABLET | Freq: Every day | ORAL | Status: DC

## 2013-09-04 NOTE — Telephone Encounter (Signed)
Patient aware. And he has appt this month to see you.

## 2013-09-11 ENCOUNTER — Encounter: Payer: Self-pay | Admitting: Internal Medicine

## 2013-09-11 ENCOUNTER — Encounter (INDEPENDENT_AMBULATORY_CARE_PROVIDER_SITE_OTHER): Payer: Self-pay

## 2013-09-11 ENCOUNTER — Ambulatory Visit (INDEPENDENT_AMBULATORY_CARE_PROVIDER_SITE_OTHER): Payer: BC Managed Care – PPO | Admitting: Internal Medicine

## 2013-09-11 VITALS — BP 88/68 | HR 86 | Ht 73.0 in | Wt 303.6 lb

## 2013-09-11 DIAGNOSIS — I442 Atrioventricular block, complete: Secondary | ICD-10-CM

## 2013-09-11 DIAGNOSIS — I472 Ventricular tachycardia, unspecified: Secondary | ICD-10-CM

## 2013-09-11 DIAGNOSIS — I5022 Chronic systolic (congestive) heart failure: Secondary | ICD-10-CM

## 2013-09-11 DIAGNOSIS — I429 Cardiomyopathy, unspecified: Secondary | ICD-10-CM

## 2013-09-11 DIAGNOSIS — Z95810 Presence of automatic (implantable) cardiac defibrillator: Secondary | ICD-10-CM

## 2013-09-11 LAB — MDC_IDC_ENUM_SESS_TYPE_INCLINIC
Battery Remaining Longevity: 78 mo
Battery Voltage: 3.03 V
Brady Statistic AS VS Percent: 0 %
Lead Channel Impedance Value: 399 Ohm
Lead Channel Pacing Threshold Amplitude: 0.875 V
Lead Channel Pacing Threshold Amplitude: 0.875 V
Lead Channel Pacing Threshold Pulse Width: 0.4 ms
Lead Channel Pacing Threshold Pulse Width: 0.8 ms
Lead Channel Sensing Intrinsic Amplitude: 1.125 mV
Lead Channel Sensing Intrinsic Amplitude: 1.875 mV
Lead Channel Setting Pacing Amplitude: 2 V
Lead Channel Setting Pacing Pulse Width: 0.8 ms
Lead Channel Setting Sensing Sensitivity: 0.3 mV
Zone Setting Detection Interval: 280 ms
Zone Setting Detection Interval: 350 ms
Zone Setting Detection Interval: 500 ms

## 2013-09-11 MED ORDER — AMIODARONE HCL 400 MG PO TABS: ORAL_TABLET | ORAL | Status: DC

## 2013-09-11 NOTE — Assessment & Plan Note (Signed)
His VT has been well controlled. Will reduce dose of amiodarone slightly to 400 mg mon-fri and 200 mg on Sat. And Sunday.

## 2013-09-11 NOTE — Patient Instructions (Addendum)
Your physician wants you to follow-up in: 6 months with Dr Court Joy will receive a reminder letter in the mail two months in advance. If you don't receive a letter, please call our office to schedule the follow-up appointment.   Remote monitoring is used to monitor your Pacemaker or ICD from home. This monitoring reduces the number of office visits required to check your device to one time per year. It allows [place] to keep an eye on the functioning of your device to ensure it is working properly. You are scheduled for a device check from home on 12/13/13. You may send your transmission at any time that day. If you have a wireless device, the transmission will be sent automatically. After your physician reviews your transmission, you will receive a postcard with your next transmission date.   Your physician has recommended you make the following change in your medication:  1) Decrease Amiodarone 400mg  Mon-Fri and 200mg  on Sat and Sun

## 2013-09-11 NOTE — Assessment & Plan Note (Signed)
His device is working normally. Will recheck in several months. 

## 2013-09-11 NOTE — Progress Notes (Signed)
HPI Mr. Steen returns today for followup. He is a very pleasant middle-age man with a hypertrophic cardiomyopathy, chronic systolic heart failure, complete heart block, and ventricular tachycardia. He has been on amiodarone and mexiletine. His ventricular tachycardia has been fairly well-controlled with a one episode of ICD shock in the last 12 months. His heart failure remains class III. The patient denies syncope. He has mild peripheral edema. He is now disabled. He denies chest pain. He has had some insomnia. Allergies  Allergen Reactions  . Ace Inhibitors     AVOID ALL  BECAUSE OF HEART DISEASE   . Nitroglycerin Other (See Comments)    Reaction unknown  . Procan Sr [Procainamide Hcl]     PT. HAS HEART DISEASE CALLED HYPERTHROPHIC CARDIOMYOPATHY  . Quinidine     PT. HAS HEART DISEASE CALLED HYPERTHROPHIC CARDIOMYOPATHY       Current Outpatient Prescriptions  Medication Sig Dispense Refill  . amiodarone (PACERONE) 400 MG tablet Take 200mg  on Sat and Sun take 400mg  all other days  90 tablet  2  . aspirin 325 MG tablet Take 325 mg by mouth daily.        . carvedilol (COREG) 25 MG tablet TAKE 1 TABLET TWICE A DAY  180 tablet  0  . cholecalciferol (VITAMIN D) 1000 UNITS tablet Take 2,000 Units by mouth daily.       . colesevelam (WELCHOL) 625 MG tablet Take 625 mg by mouth 3 (three) times daily.       . fish oil-omega-3 fatty acids 1000 MG capsule Take 3 capsules by mouth daily.       . furosemide (LASIX) 40 MG tablet Take 1 tablet (40 mg total) by mouth daily.  90 tablet  1  . glimepiride (AMARYL) 4 MG tablet Take 1 tablet (4 mg total) by mouth 2 (two) times daily.  180 tablet  3  . insulin glargine (LANTUS) 100 UNIT/ML injection Inject 0.5 mLs (50 Units total) into the skin 2 (two) times daily.  90 mL  1  . insulin lispro (HUMALOG KWIKPEN) 100 UNIT/ML SOPN Inject 20 units with largest/evening meal Dispense quikpens  30 mL  1  . levothyroxine (SYNTHROID, LEVOTHROID) 150 MCG tablet Take 1  tablet (150 mcg total) by mouth daily.  90 tablet  3  . lisinopril (PRINIVIL,ZESTRIL) 2.5 MG tablet Take 1 tablet (2.5 mg total) by mouth 2 (two) times daily.  180 tablet  3  . mexiletine (MEXITIL) 150 MG capsule Take 1 capsule (150 mg total) by mouth every 8 (eight) hours.  270 capsule  1  . rosuvastatin (CRESTOR) 20 MG tablet Take 0.5 tablets (10 mg total) by mouth daily.  21 tablet  0  . sitaGLIPtin (JANUVIA) 50 MG tablet Take 1 tablet (50 mg total) by mouth daily.  30 tablet  2   No current facility-administered medications for this visit.     Past Medical History  Diagnosis Date  . DM   . HYPERLIPIDEMIA-MIXED   . Obesity, unspecified   . HYPERTENSION, UNSPECIFIED   . MYOCARDIAL INFARCTION     LAD 50% to 60% stenosis, OM 150% stenosis, OM 240% stenosis, right coronary artery 40% stenosis, 11/12  . Hypertrophic obstructive cardiomyopathy     Poor acoustic windows on echo 2010  . CARDIOMYOPATHY, SECONDARY   . AV BLOCK, COMPLETE   . RENAL INSUFFICIENCY   . AUTOMATIC IMPLANTABLE CARDIAC DEFIBRILLATOR SITU   . Blockage of coronary artery of heart   . BPH (benign prostatic hypertrophy)   .  Colon polyps   . Ventricular tachycardia     ROS:   All systems reviewed and negative except as noted in the HPI.   Past Surgical History  Procedure Laterality Date  . Pacemaker insertion      medtronic  . Cardiac catheterization    . Facial reconstruction surgery      after trauma in 2006     Family History  Problem Relation Age of Onset  . Diabetic kidney disease Father   . Coronary artery disease Neg Hx      History   Social History  . Marital Status: Married    Spouse Name: N/A    Number of Children: N/A  . Years of Education: N/A   Occupational History  . Not on file.   Social History Main Topics  . Smoking status: Former Smoker    Types: Cigarettes    Quit date: 10/04/1968  . Smokeless tobacco: Never Used  . Alcohol Use: Yes     Comment: rarely  . Drug Use:  No  . Sexual Activity: Not on file   Other Topics Concern  . Not on file   Social History Narrative  . No narrative on file     BP 88/68  Pulse 86  Ht 6\' 1"  (1.854 m)  Wt 303 lb 9.6 oz (137.712 kg)  BMI 40.06 kg/m2  Physical Exam:  stable appearing middle-aged man,NAD HEENT: Unremarkable Neck:  7 cm JVD, no thyromegally Back:  No CVA tenderness Lungs:  Clear with no wheezes, rales, or rhonchi. HEART:  Regular rate rhythm, no murmurs, no rubs, no clicks Abd:  soft, positive bowel sounds, no organomegally, no rebound, no guarding Ext:  2 plus pulses, no edema, no cyanosis, no clubbing Skin:  No rashes no nodules Neuro:  CN II through XII intact, motor grossly intact  EKG - normal sinus rhythm with AV sequential biventricular pacing  DEVICE  Normal device function.  See PaceArt for details. Device at elective replacement  Assess/Plan:

## 2013-09-28 ENCOUNTER — Encounter (HOSPITAL_COMMUNITY): Payer: Self-pay | Admitting: Emergency Medicine

## 2013-09-28 DIAGNOSIS — Z794 Long term (current) use of insulin: Secondary | ICD-10-CM

## 2013-09-28 DIAGNOSIS — N400 Enlarged prostate without lower urinary tract symptoms: Secondary | ICD-10-CM | POA: Diagnosis present

## 2013-09-28 DIAGNOSIS — I129 Hypertensive chronic kidney disease with stage 1 through stage 4 chronic kidney disease, or unspecified chronic kidney disease: Secondary | ICD-10-CM | POA: Diagnosis present

## 2013-09-28 DIAGNOSIS — I252 Old myocardial infarction: Secondary | ICD-10-CM

## 2013-09-28 DIAGNOSIS — Z7982 Long term (current) use of aspirin: Secondary | ICD-10-CM

## 2013-09-28 DIAGNOSIS — A419 Sepsis, unspecified organism: Secondary | ICD-10-CM | POA: Diagnosis present

## 2013-09-28 DIAGNOSIS — IMO0001 Reserved for inherently not codable concepts without codable children: Secondary | ICD-10-CM | POA: Diagnosis present

## 2013-09-28 DIAGNOSIS — J189 Pneumonia, unspecified organism: Secondary | ICD-10-CM | POA: Diagnosis present

## 2013-09-28 DIAGNOSIS — I5189 Other ill-defined heart diseases: Secondary | ICD-10-CM | POA: Diagnosis present

## 2013-09-28 DIAGNOSIS — E871 Hypo-osmolality and hyponatremia: Secondary | ICD-10-CM | POA: Diagnosis present

## 2013-09-28 DIAGNOSIS — Z95810 Presence of automatic (implantable) cardiac defibrillator: Secondary | ICD-10-CM

## 2013-09-28 DIAGNOSIS — E039 Hypothyroidism, unspecified: Secondary | ICD-10-CM | POA: Diagnosis present

## 2013-09-28 DIAGNOSIS — N179 Acute kidney failure, unspecified: Secondary | ICD-10-CM | POA: Diagnosis present

## 2013-09-28 DIAGNOSIS — N12 Tubulo-interstitial nephritis, not specified as acute or chronic: Secondary | ICD-10-CM | POA: Diagnosis present

## 2013-09-28 DIAGNOSIS — M6282 Rhabdomyolysis: Secondary | ICD-10-CM | POA: Diagnosis present

## 2013-09-28 DIAGNOSIS — E782 Mixed hyperlipidemia: Secondary | ICD-10-CM | POA: Diagnosis present

## 2013-09-28 DIAGNOSIS — Z888 Allergy status to other drugs, medicaments and biological substances status: Secondary | ICD-10-CM

## 2013-09-28 DIAGNOSIS — I2510 Atherosclerotic heart disease of native coronary artery without angina pectoris: Secondary | ICD-10-CM | POA: Diagnosis present

## 2013-09-28 DIAGNOSIS — Z79899 Other long term (current) drug therapy: Secondary | ICD-10-CM

## 2013-09-28 DIAGNOSIS — E669 Obesity, unspecified: Secondary | ICD-10-CM | POA: Diagnosis present

## 2013-09-28 DIAGNOSIS — I2589 Other forms of chronic ischemic heart disease: Secondary | ICD-10-CM | POA: Diagnosis present

## 2013-09-28 DIAGNOSIS — Z6838 Body mass index (BMI) 38.0-38.9, adult: Secondary | ICD-10-CM

## 2013-09-28 DIAGNOSIS — Z87891 Personal history of nicotine dependence: Secondary | ICD-10-CM

## 2013-09-28 DIAGNOSIS — I421 Obstructive hypertrophic cardiomyopathy: Secondary | ICD-10-CM | POA: Diagnosis present

## 2013-09-28 DIAGNOSIS — N182 Chronic kidney disease, stage 2 (mild): Secondary | ICD-10-CM | POA: Diagnosis present

## 2013-09-28 DIAGNOSIS — E861 Hypovolemia: Secondary | ICD-10-CM | POA: Diagnosis not present

## 2013-09-28 DIAGNOSIS — E1010 Type 1 diabetes mellitus with ketoacidosis without coma: Secondary | ICD-10-CM | POA: Diagnosis present

## 2013-09-28 DIAGNOSIS — E875 Hyperkalemia: Secondary | ICD-10-CM | POA: Diagnosis present

## 2013-09-28 DIAGNOSIS — I509 Heart failure, unspecified: Secondary | ICD-10-CM | POA: Diagnosis present

## 2013-09-28 DIAGNOSIS — I5042 Chronic combined systolic (congestive) and diastolic (congestive) heart failure: Secondary | ICD-10-CM | POA: Diagnosis present

## 2013-09-28 DIAGNOSIS — J9600 Acute respiratory failure, unspecified whether with hypoxia or hypercapnia: Secondary | ICD-10-CM | POA: Diagnosis present

## 2013-09-28 NOTE — ED Notes (Addendum)
Pt. reports productive cough / runny nose / nasal congestion and lower back pain due to previous fall 3 weeks ago and persistent cough . Denies fever or chills. Respirations unlabored/ ambulatory.

## 2013-09-28 NOTE — ED Notes (Signed)
Called to bathroom for patient lying in floor . Pt placed in wheelchair , no injury noted

## 2013-09-29 ENCOUNTER — Encounter (HOSPITAL_COMMUNITY): Payer: Self-pay | Admitting: *Deleted

## 2013-09-29 ENCOUNTER — Inpatient Hospital Stay (HOSPITAL_COMMUNITY)
Admission: EM | Admit: 2013-09-29 | Discharge: 2013-10-04 | DRG: 871 | Disposition: A | Discharge status: Home / Self Care | Payer: BC Managed Care – PPO | Attending: Family Medicine | Admitting: Family Medicine

## 2013-09-29 ENCOUNTER — Emergency Department (HOSPITAL_COMMUNITY): Payer: BC Managed Care – PPO

## 2013-09-29 DIAGNOSIS — Z95810 Presence of automatic (implantable) cardiac defibrillator: Secondary | ICD-10-CM

## 2013-09-29 DIAGNOSIS — I429 Cardiomyopathy, unspecified: Secondary | ICD-10-CM

## 2013-09-29 DIAGNOSIS — N189 Chronic kidney disease, unspecified: Secondary | ICD-10-CM

## 2013-09-29 DIAGNOSIS — I421 Obstructive hypertrophic cardiomyopathy: Secondary | ICD-10-CM

## 2013-09-29 DIAGNOSIS — A419 Sepsis, unspecified organism: Secondary | ICD-10-CM | POA: Diagnosis present

## 2013-09-29 DIAGNOSIS — J984 Other disorders of lung: Secondary | ICD-10-CM

## 2013-09-29 DIAGNOSIS — E669 Obesity, unspecified: Secondary | ICD-10-CM

## 2013-09-29 DIAGNOSIS — E119 Type 2 diabetes mellitus without complications: Secondary | ICD-10-CM

## 2013-09-29 DIAGNOSIS — I5023 Acute on chronic systolic (congestive) heart failure: Secondary | ICD-10-CM

## 2013-09-29 DIAGNOSIS — I442 Atrioventricular block, complete: Secondary | ICD-10-CM

## 2013-09-29 DIAGNOSIS — J189 Pneumonia, unspecified organism: Secondary | ICD-10-CM

## 2013-09-29 DIAGNOSIS — I219 Acute myocardial infarction, unspecified: Secondary | ICD-10-CM

## 2013-09-29 DIAGNOSIS — E872 Acidosis, unspecified: Secondary | ICD-10-CM

## 2013-09-29 DIAGNOSIS — E785 Hyperlipidemia, unspecified: Secondary | ICD-10-CM

## 2013-09-29 DIAGNOSIS — N179 Acute kidney failure, unspecified: Secondary | ICD-10-CM

## 2013-09-29 DIAGNOSIS — N259 Disorder resulting from impaired renal tubular function, unspecified: Secondary | ICD-10-CM

## 2013-09-29 DIAGNOSIS — J9600 Acute respiratory failure, unspecified whether with hypoxia or hypercapnia: Secondary | ICD-10-CM

## 2013-09-29 DIAGNOSIS — E1129 Type 2 diabetes mellitus with other diabetic kidney complication: Secondary | ICD-10-CM

## 2013-09-29 DIAGNOSIS — I10 Essential (primary) hypertension: Secondary | ICD-10-CM

## 2013-09-29 DIAGNOSIS — I472 Ventricular tachycardia: Secondary | ICD-10-CM

## 2013-09-29 DIAGNOSIS — R0603 Acute respiratory distress: Secondary | ICD-10-CM

## 2013-09-29 DIAGNOSIS — I498 Other specified cardiac arrhythmias: Secondary | ICD-10-CM

## 2013-09-29 DIAGNOSIS — E039 Hypothyroidism, unspecified: Secondary | ICD-10-CM

## 2013-09-29 DIAGNOSIS — E875 Hyperkalemia: Secondary | ICD-10-CM

## 2013-09-29 HISTORY — DX: Other problems related to lifestyle: Z72.89

## 2013-09-29 HISTORY — DX: Benign prostatic hyperplasia without lower urinary tract symptoms: N40.0

## 2013-09-29 HISTORY — DX: Hypothyroidism, unspecified: E03.9

## 2013-09-29 HISTORY — DX: Alcohol use, unspecified, uncomplicated: F10.90

## 2013-09-29 HISTORY — DX: Chronic systolic (congestive) heart failure: I50.22

## 2013-09-29 HISTORY — DX: Atrioventricular block, complete: I44.2

## 2013-09-29 HISTORY — DX: Atherosclerotic heart disease of native coronary artery without angina pectoris: I25.10

## 2013-09-29 HISTORY — DX: Fatty (change of) liver, not elsewhere classified: K76.0

## 2013-09-29 LAB — CBC WITH DIFFERENTIAL/PLATELET
Basophils Relative: 0 % (ref 0–1)
Eosinophils Relative: 0 % (ref 0–5)
HCT: 38.4 % — ABNORMAL LOW (ref 39.0–52.0)
Lymphocytes Relative: 11 % — ABNORMAL LOW (ref 12–46)
Lymphs Abs: 1.6 10*3/uL (ref 0.7–4.0)
MCHC: 33.9 g/dL (ref 30.0–36.0)
MCV: 96.5 fL (ref 78.0–100.0)
Monocytes Absolute: 0.9 10*3/uL (ref 0.1–1.0)
Monocytes Relative: 6 % (ref 3–12)
RBC: 3.98 MIL/uL — ABNORMAL LOW (ref 4.22–5.81)
WBC: 13.7 10*3/uL — ABNORMAL HIGH (ref 4.0–10.5)

## 2013-09-29 LAB — BASIC METABOLIC PANEL
BUN: 72 mg/dL — ABNORMAL HIGH (ref 6–23)
BUN: 74 mg/dL — ABNORMAL HIGH (ref 6–23)
CO2: 19 mEq/L (ref 19–32)
CO2: 19 mEq/L (ref 19–32)
CO2: 18 mEq/L — ABNORMAL LOW (ref 19–32)
Calcium: 7.5 mg/dL — ABNORMAL LOW (ref 8.4–10.5)
Calcium: 7.8 mg/dL — ABNORMAL LOW (ref 8.4–10.5)
Calcium: 8 mg/dL — ABNORMAL LOW (ref 8.4–10.5)
Chloride: 100 mEq/L (ref 96–112)
Creatinine, Ser: 3.3 mg/dL — ABNORMAL HIGH (ref 0.50–1.35)
GFR calc Af Amer: 22 mL/min — ABNORMAL LOW (ref >90)
GFR calc Af Amer: 26 mL/min — ABNORMAL LOW (ref >90)
GFR calc Af Amer: 27 mL/min — ABNORMAL LOW (ref >90)
GFR calc non Af Amer: 17 mL/min — ABNORMAL LOW (ref >90)
GFR calc non Af Amer: 19 mL/min — ABNORMAL LOW (ref >90)
GFR calc non Af Amer: 22 mL/min — ABNORMAL LOW (ref >90)
GFR calc non Af Amer: 24 mL/min — ABNORMAL LOW (ref >90)
Glucose, Bld: 403 mg/dL — ABNORMAL HIGH (ref 70–99)
Glucose, Bld: 201 mg/dL — ABNORMAL HIGH (ref 70–99)
Potassium: 4.5 mEq/L (ref 3.5–5.1)
Potassium: 5 mEq/L (ref 3.5–5.1)
Potassium: 4.4 mEq/L (ref 3.5–5.1)
Sodium: 128 mEq/L — ABNORMAL LOW (ref 135–145)
Sodium: 132 mEq/L — ABNORMAL LOW (ref 135–145)
Sodium: 134 mEq/L — ABNORMAL LOW (ref 135–145)

## 2013-09-29 LAB — GLUCOSE, CAPILLARY
Glucose-Capillary: 384 mg/dL — ABNORMAL HIGH (ref 70–99)
Glucose-Capillary: 391 mg/dL — ABNORMAL HIGH (ref 70–99)
Glucose-Capillary: 393 mg/dL — ABNORMAL HIGH (ref 70–99)
Glucose-Capillary: 347 mg/dL — ABNORMAL HIGH (ref 70–99)
Glucose-Capillary: 186 mg/dL — ABNORMAL HIGH (ref 70–99)
Glucose-Capillary: 223 mg/dL — ABNORMAL HIGH (ref 70–99)

## 2013-09-29 LAB — POCT I-STAT 3, ART BLOOD GAS (G3+)
Acid-base deficit: 6 mmol/L — ABNORMAL HIGH (ref 0.0–2.0)
Bicarbonate: 17.4 mEq/L — ABNORMAL LOW (ref 20.0–24.0)
O2 Saturation: 92 %
Patient temperature: 97.8
pCO2 arterial: 26.8 mmHg — ABNORMAL LOW (ref 35.0–45.0)

## 2013-09-29 LAB — CBC
MCH: 32.3 pg (ref 26.0–34.0)
MCV: 94.9 fL (ref 78.0–100.0)
Platelets: 121 10*3/uL — ABNORMAL LOW (ref 150–400)
RDW: 14.6 % (ref 11.5–15.5)
WBC: 10.7 10*3/uL — ABNORMAL HIGH (ref 4.0–10.5)

## 2013-09-29 LAB — TROPONIN I: Troponin I: <0.3 ng/mL (ref <0.30)

## 2013-09-29 LAB — CK TOTAL AND CKMB (NOT AT ARMC)
CK, MB: 4.5 ng/mL — ABNORMAL HIGH (ref 0.3–4.0)
Relative Index: 0.6 (ref 0.0–2.5)
Total CK: 706 U/L — ABNORMAL HIGH (ref 7–232)

## 2013-09-29 LAB — COMPREHENSIVE METABOLIC PANEL
ALT: 36 U/L (ref 0–53)
BUN: 68 mg/dL — ABNORMAL HIGH (ref 6–23)
CO2: 18 mEq/L — ABNORMAL LOW (ref 19–32)
Calcium: 8.1 mg/dL — ABNORMAL LOW (ref 8.4–10.5)
Creatinine, Ser: 3.8 mg/dL — ABNORMAL HIGH (ref 0.50–1.35)
GFR calc Af Amer: 19 mL/min — ABNORMAL LOW (ref >90)
GFR calc non Af Amer: 16 mL/min — ABNORMAL LOW (ref >90)
Glucose, Bld: 386 mg/dL — ABNORMAL HIGH (ref 70–99)
Total Protein: 6.5 g/dL (ref 6.0–8.3)

## 2013-09-29 LAB — URINALYSIS, ROUTINE W REFLEX MICROSCOPIC
Hgb urine dipstick: NEGATIVE
Nitrite: POSITIVE — AB
Protein, ur: NEGATIVE mg/dL
Specific Gravity, Urine: 1.025 (ref 1.005–1.030)
Urobilinogen, UA: 1 mg/dL (ref 0.0–1.0)

## 2013-09-29 LAB — MRSA PCR SCREENING: MRSA by PCR: NEGATIVE

## 2013-09-29 LAB — LACTIC ACID, PLASMA: Lactic Acid, Venous: 3.5 mmol/L — ABNORMAL HIGH (ref 0.5–2.2)

## 2013-09-29 LAB — INFLUENZA PANEL BY PCR (TYPE A & B)
H1N1 flu by pcr: NOT DETECTED
Influenza B By PCR: NEGATIVE

## 2013-09-29 LAB — POCT I-STAT TROPONIN I

## 2013-09-29 LAB — PRO B NATRIURETIC PEPTIDE: Pro B Natriuretic peptide (BNP): 12088 pg/mL — ABNORMAL HIGH (ref 0–125)

## 2013-09-29 MED ORDER — ONDANSETRON HCL 4 MG PO TABS: 4.0000 mg | ORAL_TABLET | Freq: Four times a day (QID) | ORAL | Status: DC | PRN

## 2013-09-29 MED ORDER — LEVOTHYROXINE SODIUM 150 MCG PO TABS
150.0000 ug | ORAL_TABLET | Freq: Every day | ORAL | Status: DC
  Administered 2013-09-29 – 2013-10-04 (×6): 150 ug via ORAL
  Filled 2013-09-29 (×8): qty 1

## 2013-09-29 MED ORDER — SODIUM CHLORIDE 0.9 % IV SOLN
INTRAVENOUS | Status: DC
  Administered 2013-09-29: 08:00:00 via INTRAVENOUS

## 2013-09-29 MED ORDER — ALBUTEROL SULFATE (5 MG/ML) 0.5% IN NEBU
2.5000 mg | INHALATION_SOLUTION | RESPIRATORY_TRACT | Status: DC
  Administered 2013-09-29 – 2013-09-30 (×7): 2.5 mg via RESPIRATORY_TRACT
  Filled 2013-09-29 (×6): qty 0.5

## 2013-09-29 MED ORDER — AMIODARONE HCL 200 MG PO TABS
200.0000 mg | ORAL_TABLET | ORAL | Status: DC
  Administered 2013-09-29 – 2013-09-30 (×2): 200 mg via ORAL
  Filled 2013-09-29 (×2): qty 1

## 2013-09-29 MED ORDER — FOLIC ACID 1 MG PO TABS
1.0000 mg | ORAL_TABLET | Freq: Every day | ORAL | Status: DC
  Administered 2013-09-29 – 2013-10-04 (×6): 1 mg via ORAL
  Filled 2013-09-29 (×6): qty 1

## 2013-09-29 MED ORDER — METHYLPREDNISOLONE SODIUM SUCC 125 MG IJ SOLR
60.0000 mg | Freq: Two times a day (BID) | INTRAMUSCULAR | Status: DC
  Administered 2013-09-29 – 2013-09-30 (×3): 60 mg via INTRAVENOUS
  Filled 2013-09-29: qty 0.96
  Filled 2013-09-29 (×2): qty 2
  Filled 2013-09-29: qty 0.96

## 2013-09-29 MED ORDER — ALBUTEROL SULFATE (5 MG/ML) 0.5% IN NEBU: 2.5000 mg | INHALATION_SOLUTION | RESPIRATORY_TRACT | Status: DC | PRN

## 2013-09-29 MED ORDER — DEXTROSE-NACL 5-0.45 % IV SOLN
INTRAVENOUS | Status: DC
  Administered 2013-09-29 – 2013-09-30 (×2): via INTRAVENOUS

## 2013-09-29 MED ORDER — IPRATROPIUM BROMIDE 0.02 % IN SOLN
0.5000 mg | RESPIRATORY_TRACT | Status: DC
  Administered 2013-09-29 – 2013-09-30 (×7): 0.5 mg via RESPIRATORY_TRACT
  Filled 2013-09-29 (×6): qty 2.5

## 2013-09-29 MED ORDER — ATORVASTATIN CALCIUM 20 MG PO TABS
20.0000 mg | ORAL_TABLET | Freq: Every day | ORAL | Status: DC
  Administered 2013-09-29 – 2013-10-03 (×5): 20 mg via ORAL
  Filled 2013-09-29 (×6): qty 1

## 2013-09-29 MED ORDER — ASPIRIN 81 MG PO CHEW
324.0000 mg | CHEWABLE_TABLET | Freq: Once | ORAL | Status: AC
  Administered 2013-09-29: 324 mg via ORAL
  Filled 2013-09-29: qty 4

## 2013-09-29 MED ORDER — INSULIN ASPART 100 UNIT/ML ~~LOC~~ SOLN: 0.0000 [IU] | Freq: Three times a day (TID) | SUBCUTANEOUS | Status: DC

## 2013-09-29 MED ORDER — DOCUSATE SODIUM 100 MG PO CAPS
100.0000 mg | ORAL_CAPSULE | Freq: Two times a day (BID) | ORAL | Status: DC
  Administered 2013-09-30: 100 mg via ORAL
  Filled 2013-09-29 (×4): qty 1

## 2013-09-29 MED ORDER — ACETAMINOPHEN 650 MG RE SUPP: 650.0000 mg | Freq: Four times a day (QID) | RECTAL | Status: DC | PRN

## 2013-09-29 MED ORDER — AMIODARONE HCL 200 MG PO TABS: 200.0000 mg | ORAL_TABLET | Freq: Every day | ORAL | Status: DC

## 2013-09-29 MED ORDER — ASPIRIN 325 MG PO TABS
325.0000 mg | ORAL_TABLET | Freq: Every day | ORAL | Status: DC
Start: 2013-09-29 — End: 2013-09-30
  Administered 2013-09-29 – 2013-09-30 (×2): 325 mg via ORAL
  Filled 2013-09-29 (×2): qty 1

## 2013-09-29 MED ORDER — VITAMIN B-1 100 MG PO TABS
100.0000 mg | ORAL_TABLET | Freq: Every day | ORAL | Status: DC
  Administered 2013-09-29 – 2013-10-04 (×6): 100 mg via ORAL
  Filled 2013-09-29 (×6): qty 1

## 2013-09-29 MED ORDER — DEXTROSE 5 % IV SOLN
1.0000 g | Freq: Once | INTRAVENOUS | Status: AC
  Administered 2013-09-29: 1 g via INTRAVENOUS
  Filled 2013-09-29: qty 10

## 2013-09-29 MED ORDER — MORPHINE SULFATE 2 MG/ML IJ SOLN: 1.0000 mg | INTRAMUSCULAR | Status: DC | PRN

## 2013-09-29 MED ORDER — ALBUTEROL SULFATE (5 MG/ML) 0.5% IN NEBU
INHALATION_SOLUTION | RESPIRATORY_TRACT | Status: AC
  Filled 2013-09-29: qty 0.5

## 2013-09-29 MED ORDER — IPRATROPIUM BROMIDE 0.02 % IN SOLN
RESPIRATORY_TRACT | Status: AC
  Filled 2013-09-29: qty 2.5

## 2013-09-29 MED ORDER — AMIODARONE HCL 200 MG PO TABS
400.0000 mg | ORAL_TABLET | ORAL | Status: DC
  Administered 2013-10-01 – 2013-10-04 (×4): 400 mg via ORAL
  Filled 2013-09-29 (×4): qty 2

## 2013-09-29 MED ORDER — ALBUTEROL SULFATE (5 MG/ML) 0.5% IN NEBU
5.0000 mg | INHALATION_SOLUTION | Freq: Once | RESPIRATORY_TRACT | Status: AC
  Administered 2013-09-29: 5 mg via RESPIRATORY_TRACT
  Filled 2013-09-29: qty 1

## 2013-09-29 MED ORDER — DEXTROSE 5 % IV SOLN
1.0000 g | INTRAVENOUS | Status: DC
  Administered 2013-09-29 – 2013-10-04 (×5): 1 g via INTRAVENOUS
  Filled 2013-09-29 (×7): qty 10

## 2013-09-29 MED ORDER — SODIUM CHLORIDE 0.9 % IV BOLUS (SEPSIS)
1000.0000 mL | Freq: Once | INTRAVENOUS | Status: AC
  Administered 2013-09-29: 1000 mL via INTRAVENOUS

## 2013-09-29 MED ORDER — DEXTROSE 5 % IV SOLN
500.0000 mg | INTRAVENOUS | Status: DC
  Administered 2013-09-29: 500 mg via INTRAVENOUS
  Filled 2013-09-29: qty 500

## 2013-09-29 MED ORDER — OSELTAMIVIR PHOSPHATE 75 MG PO CAPS
75.0000 mg | ORAL_CAPSULE | Freq: Every day | ORAL | Status: DC
  Administered 2013-09-29 – 2013-09-30 (×2): 75 mg via ORAL
  Filled 2013-09-29 (×2): qty 1

## 2013-09-29 MED ORDER — DEXTROSE 5 % IV SOLN
500.0000 mg | Freq: Once | INTRAVENOUS | Status: AC
  Administered 2013-09-29: 500 mg via INTRAVENOUS

## 2013-09-29 MED ORDER — SODIUM CHLORIDE 0.9 % IV SOLN
INTRAVENOUS | Status: DC
  Administered 2013-09-29: 12.1 [IU]/h via INTRAVENOUS
  Administered 2013-09-29: 3.3 [IU]/h via INTRAVENOUS
  Administered 2013-09-30: 5.4 [IU]/h via INTRAVENOUS
  Filled 2013-09-29 (×3): qty 1

## 2013-09-29 MED ORDER — ACETAMINOPHEN 325 MG PO TABS: 650.0000 mg | ORAL_TABLET | Freq: Four times a day (QID) | ORAL | Status: DC | PRN

## 2013-09-29 MED ORDER — DEXTROSE 50 % IV SOLN: 25.0000 mL | INTRAVENOUS | Status: DC | PRN

## 2013-09-29 MED ORDER — ONDANSETRON HCL 4 MG/2ML IJ SOLN: 4.0000 mg | Freq: Four times a day (QID) | INTRAMUSCULAR | Status: DC | PRN

## 2013-09-29 MED ORDER — SODIUM CHLORIDE 0.45 % IV SOLN: INTRAVENOUS | Status: DC

## 2013-09-29 MED ORDER — BIOTENE DRY MOUTH MT LIQD
15.0000 mL | Freq: Two times a day (BID) | OROMUCOSAL | Status: DC
  Administered 2013-09-29 – 2013-10-04 (×10): 15 mL via OROMUCOSAL

## 2013-09-29 MED ORDER — SODIUM CHLORIDE 0.9 % IV BOLUS (SEPSIS)
250.0000 mL | Freq: Once | INTRAVENOUS | Status: AC
  Administered 2013-09-29: 250 mL via INTRAVENOUS

## 2013-09-29 MED ORDER — HEPARIN SODIUM (PORCINE) 5000 UNIT/ML IJ SOLN
5000.0000 [IU] | Freq: Three times a day (TID) | INTRAMUSCULAR | Status: DC
  Administered 2013-09-29 – 2013-09-30 (×4): 5000 [IU] via SUBCUTANEOUS
  Filled 2013-09-29 (×7): qty 1

## 2013-09-29 MED ORDER — INSULIN REGULAR BOLUS VIA INFUSION
0.0000 [IU] | Freq: Three times a day (TID) | INTRAVENOUS | Status: DC
  Administered 2013-09-29: 0.8 [IU] via INTRAVENOUS
  Administered 2013-09-29: 3.2 [IU] via INTRAVENOUS
  Filled 2013-09-29: qty 10

## 2013-09-29 NOTE — ED Notes (Signed)
Respiratory tech, Sarah, notified for blood gas draws.

## 2013-09-29 NOTE — ED Provider Notes (Signed)
CSN: [id number]     Arrival date & time 09/28/13  1831 History   First MD Initiated Contact with Patient 09/29/13 0210     Chief Complaint  Patient presents with  . Cough  . Back Pain   (Consider location/radiation/quality/duration/timing/severity/associated sxs/prior Treatment) HPI This patient is a 57 yo man with DM, HTN, CAD - history of MI, ischemic cardiomyopathy, CKD. He presents with 3 days of a productive cough and subjective fever. He denies SOB when he is not coughing. Denies chest pain. His po intake has been substantially diminished secondary to poor appetite.   The patient has been persistently nauseated and has vomited NBNB emesis x 2-3 times. He has had multiple episodes of non-bloody diarrhea. He says he feels so weak that he can barely walk.   Patient has held all of his medications for the past 10 to 15 days, including his insulin. Patient last performed an acucheck about 48 hrs ago - at that time BG was 53 mg/dL.  Past Medical History  Diagnosis Date  . DM   . HYPERLIPIDEMIA-MIXED   . Obesity, unspecified   . HYPERTENSION, UNSPECIFIED   . MYOCARDIAL INFARCTION     LAD 50% to 60% stenosis, OM 150% stenosis, OM 240% stenosis, right coronary artery 40% stenosis, 11/12  . Hypertrophic obstructive cardiomyopathy     Poor acoustic windows on echo 2010  . CARDIOMYOPATHY, SECONDARY   . AV BLOCK, COMPLETE   . RENAL INSUFFICIENCY   . AUTOMATIC IMPLANTABLE CARDIAC DEFIBRILLATOR SITU   . Blockage of coronary artery of heart   . BPH (benign prostatic hypertrophy)   . Colon polyps   . Ventricular tachycardia    Past Surgical History  Procedure Laterality Date  . Pacemaker insertion      medtronic  . Cardiac catheterization    . Facial reconstruction surgery      after trauma in 2006   Family History  Problem Relation Age of Onset  . Diabetic kidney disease Father   . Coronary artery disease Neg Hx    History  Substance Use Topics  . Smoking status: Former Smoker     Types: Cigarettes    Quit date: 10/04/1968  . Smokeless tobacco: Never Used  . Alcohol Use: Yes     Comment: rarely    Review of Systems Ten point review of symptoms performed and is negative with the exception of symptoms noted above.   Allergies  Ace inhibitors; Nitroglycerin; Procan sr; and Quinidine  Home Medications   Current Outpatient Rx  Name  Route  Sig  Dispense  Refill  . amiodarone (PACERONE) 400 MG tablet   Oral   Take 200-400 mg by mouth daily. Take 200mg  on Sat and Sun take 400mg  all other days         . aspirin 325 MG tablet   Oral   Take 325 mg by mouth daily.           . carvedilol (COREG) 25 MG tablet      TAKE 1 TABLET TWICE A DAY   180 tablet   0   . cholecalciferol (VITAMIN D) 1000 UNITS tablet   Oral   Take 2,000 Units by mouth daily.          . colesevelam (WELCHOL) 625 MG tablet   Oral   Take 625 mg by mouth 3 (three) times daily.          . fish oil-omega-3 fatty acids 1000 MG capsule   Oral  Take 3 capsules by mouth daily.          . furosemide (LASIX) 40 MG tablet   Oral   Take 1 tablet (40 mg total) by mouth daily.   90 tablet   1   . glimepiride (AMARYL) 4 MG tablet   Oral   Take 1 tablet (4 mg total) by mouth 2 (two) times daily.   180 tablet   3   . insulin glargine (LANTUS) 100 UNIT/ML injection   Subcutaneous   Inject 0.5 mLs (50 Units total) into the skin 2 (two) times daily.   90 mL   1   . insulin lispro (HUMALOG KWIKPEN) 100 UNIT/ML SOPN      Inject 20 units with largest/evening meal Dispense quikpens   30 mL   1   . levothyroxine (SYNTHROID, LEVOTHROID) 150 MCG tablet   Oral   Take 1 tablet (150 mcg total) by mouth daily.   90 tablet   3   . lisinopril (PRINIVIL,ZESTRIL) 2.5 MG tablet   Oral   Take 1 tablet (2.5 mg total) by mouth 2 (two) times daily.   180 tablet   3   . mexiletine (MEXITIL) 150 MG capsule   Oral   Take 1 capsule (150 mg total) by mouth every 8 (eight) hours.    270 capsule   1   . rosuvastatin (CRESTOR) 20 MG tablet   Oral   Take 0.5 tablets (10 mg total) by mouth daily.   21 tablet   0   . sitaGLIPtin (JANUVIA) 50 MG tablet   Oral   Take 1 tablet (50 mg total) by mouth daily.   30 tablet   2     Discontinue tradjenta.    BP 98/59  Pulse 76  Temp(Src) 97.8 F (36.6 C) (Oral)  Resp 20  SpO2 95% Physical Exam Gen: well developed and well nourished appearing, ill Head: NCAT Eyes: PERL, EOMI Nose: no epistaixis or rhinorrhea Mouth/throat: mucosa is dehydrated appearing and pink Neck: supple, no stridor Lungs: Respiratory rate 28 per minute,  scattered wheezing bilaterally, mild accessory muscle use, rhonchi or rales CV: regular rate and rythm, good distal pulses.  Abd: soft, obese, notender, nondistended Back: no ttp, no cva ttp Skin: warm and dry Ext: no edema, normal to inspection Neuro: CN ii-xii grossly intact, no focal deficits Psyche; normal affect,  calm and cooperative.  ED Course  Procedures (including critical care time) Labs Review   Results for orders placed during the hospital encounter of 09/29/13 (from the past 24 hour(s))  CBC WITH DIFFERENTIAL     Status: Abnormal   Collection Time    09/29/13  3:02 AM      Result Value Range   WBC 13.7 (*) 4.0 - 10.5 K/uL   RBC 3.98 (*) 4.22 - 5.81 MIL/uL   Hemoglobin 13.0  13.0 - 17.0 g/dL   HCT 16.1 (*) 09.6 - 04.5 %   MCV 96.5  78.0 - 100.0 fL   MCH 32.7  26.0 - 34.0 pg   MCHC 33.9  30.0 - 36.0 g/dL   RDW 40.9  81.1 - 91.4 %   Platelets 129 (*) 150 - 400 K/uL   Neutrophils Relative % 82 (*) 43 - 77 %   Neutro Abs 11.2 (*) 1.7 - 7.7 K/uL   Lymphocytes Relative 11 (*) 12 - 46 %   Lymphs Abs 1.6  0.7 - 4.0 K/uL   Monocytes Relative 6  3 - 12 %  Monocytes Absolute 0.9  0.1 - 1.0 K/uL   Eosinophils Relative 0  0 - 5 %   Eosinophils Absolute 0.0  0.0 - 0.7 K/uL   Basophils Relative 0  0 - 1 %   Basophils Absolute 0.0  0.0 - 0.1 K/uL  POCT I-STAT TROPONIN I      Status: Abnormal   Collection Time    09/29/13  3:03 AM      Result Value Range   Troponin i, poc 0.15 (*) 0.00 - 0.08 ng/mL   Comment NOTIFIED PHYSICIAN     Comment 3           GLUCOSE, CAPILLARY     Status: Abnormal   Collection Time    09/29/13  3:37 AM      Result Value Range   Glucose-Capillary 338 (*) 70 - 99 mg/dL    EKG: NSR, rate 67 bpm, wide QRS with RBBB, no acute ischemic change identified.   CXR: patchy right sided infiltrate.   MDM  Ddx: influenza, bronchitis, pneumonia. ACS.   Patient with pneumonia and acute respiratory distress complicated by acute on chronic renal failure secondary to azotemia. The patient is septic with an elevated lactic acid. His BP has been marginal. We are treating cautiously with IVF resuscitation in light of the patient's history of ischemic cardiomyopathy. His BP has been marginal but, improved from admission to the ED. We will repeat lactic acid to gauge success of resuscitation.  Case discussed with Dr. Lovell Sheehan who will admit to the SDU.   CRITICAL CARE Performed by: Brandt Loosen   Total critical care time: 1m  Critical care time was exclusive of separately billable procedures and treating other patients.  Critical care was necessary to treat or prevent imminent or life-threatening deterioration.  Critical care was time spent personally by me on the following activities: development of treatment plan with patient and/or surrogate as well as nursing, discussions with consultants, evaluation of patient's response to treatment, examination of patient, obtaining history from patient or surrogate, ordering and performing treatments and interventions, ordering and review of laboratory studies, ordering and review of radiographic studies, pulse oximetry and re-evaluation of patient's condition.   Brandt Loosen, MD 09/29/13 [phone]

## 2013-09-29 NOTE — H&P (Signed)
[facility] History and Physical  [name] ZOX:[id number] DOB: 1955/12/10 DOA: 09/29/2013  Referring physician: Dr Lavella Lemons PCP: [name], MD   Chief Complaint: Generalized weakness, SOB.   HPI: Dakota FERNANDEZ Sr. is a 57 y.o. male with DM, HTN, CAD - history of MI, ischemic cardiomyopathy, CKD cr range 1.4 to 1.5 who presents complaining of generalized weakness, dyspnea on exertion. He fell in the lobby of the hospital because of generalized weakness. He has been having diarrhea for last 3 days. He relates productive cough, Dyspnea, chest pain with cough for last 2 week.  He denies abdominal pain.  BP at this time in the 100 range. Oxygen saturation 96 on 3 L. He received 1 L IV fluids in the ED.    Review of Systems:  Negative except as per HPI.   Past Medical History  Diagnosis Date  . DM   . HYPERLIPIDEMIA-MIXED   . Obesity, unspecified   . HYPERTENSION, UNSPECIFIED   . MYOCARDIAL INFARCTION     LAD 50% to 60% stenosis, OM 150% stenosis, OM 240% stenosis, right coronary artery 40% stenosis, 11/12  . Hypertrophic obstructive cardiomyopathy     Poor acoustic windows on echo 2010  . CARDIOMYOPATHY, SECONDARY   . AV BLOCK, COMPLETE   . RENAL INSUFFICIENCY   . AUTOMATIC IMPLANTABLE CARDIAC DEFIBRILLATOR SITU   . Blockage of coronary artery of heart   . BPH (benign prostatic hypertrophy)   . Colon polyps   . Ventricular tachycardia    Past Surgical History  Procedure Laterality Date  . Pacemaker insertion      medtronic  . Cardiac catheterization    . Facial reconstruction surgery      after trauma in 2006   Social History:  reports that he quit smoking about 45 years ago. His smoking use included Cigarettes. He smoked 0.00 packs per day. He has never used smokeless tobacco. He reports that he drinks alcohol. He reports that he does not use illicit drugs.  Allergies  Allergen Reactions  . Ace Inhibitors     AVOID ALL  BECAUSE OF HEART DISEASE   .  Nitroglycerin Other (See Comments)    Reaction unknown  . Procan Sr [Procainamide Hcl]     PT. HAS HEART DISEASE CALLED HYPERTHROPHIC CARDIOMYOPATHY  . Quinidine     PT. HAS HEART DISEASE CALLED HYPERTHROPHIC CARDIOMYOPATHY      Family History  Problem Relation Age of Onset  . Diabetic kidney disease Father   . Coronary artery disease Neg Hx      Prior to Admission medications   Medication Sig Start Date End Date Taking? Authorizing Provider  amiodarone (PACERONE) 400 MG tablet Take 200-400 mg by mouth daily. Take 200mg  on Sat and Sun take 400mg  all other days 09/11/13  Yes Marinus Maw, MD  aspirin 325 MG tablet Take 325 mg by mouth daily.     Yes Historical Provider, MD  carvedilol (COREG) 25 MG tablet TAKE 1 TABLET TWICE A DAY 07/09/13  Yes Rollene Rotunda, MD  cholecalciferol (VITAMIN D) 1000 UNITS tablet Take 2,000 Units by mouth daily.    Yes Historical Provider, MD  colesevelam (WELCHOL) 625 MG tablet Take 625 mg by mouth 3 (three) times daily.    Yes Historical Provider, MD  fish oil-omega-3 fatty acids 1000 MG capsule Take 3 capsules by mouth daily.    Yes Historical Provider, MD  furosemide (LASIX) 40 MG tablet Take 1 tablet (40 mg total) by mouth daily.  07/26/13  Yes Ernestina Penna, MD  glimepiride (AMARYL) 4 MG tablet Take 1 tablet (4 mg total) by mouth 2 (two) times daily. 06/13/13  Yes Ernestina Penna, MD  insulin glargine (LANTUS) 100 UNIT/ML injection Inject 0.5 mLs (50 Units total) into the skin 2 (two) times daily. 05/24/13  Yes Tammy Eckard, PHARMD  insulin lispro (HUMALOG KWIKPEN) 100 UNIT/ML SOPN Inject 20 units with largest/evening meal Dispense quikpens 05/24/13  Yes Tammy Eckard, PHARMD  levothyroxine (SYNTHROID, LEVOTHROID) 150 MCG tablet Take 1 tablet (150 mcg total) by mouth daily. 08/02/13  Yes Tammy Eckard, PHARMD  lisinopril (PRINIVIL,ZESTRIL) 2.5 MG tablet Take 1 tablet (2.5 mg total) by mouth 2 (two) times daily. 06/13/13  Yes Ernestina Penna, MD  mexiletine  (MEXITIL) 150 MG capsule Take 1 capsule (150 mg total) by mouth every 8 (eight) hours. 07/26/13  Yes Ernestina Penna, MD  rosuvastatin (CRESTOR) 20 MG tablet Take 0.5 tablets (10 mg total) by mouth daily. 03/20/13  Yes Ernestina Penna, MD  sitaGLIPtin (JANUVIA) 50 MG tablet Take 1 tablet (50 mg total) by mouth daily. 09/03/13  Yes Henrene Pastor, PHARMD   Physical Exam: Filed Vitals:   09/29/13 0800  BP: 87/53  Pulse: 78  Temp:   Resp: 22    BP 87/53  Pulse 78  Temp(Src) 97.6 F (36.4 C) (Oral)  Resp 22  SpO2 98%  General:  Appears calm and comfortable Eyes: PERRL, normal lids, irises & conjunctiva ENT: grossly normal hearing, lips & tongue Neck: no LAD, masses or thyromegaly Cardiovascular: RRR, no m/r/g. LE with trace edema edema. Telemetry: SR, no arrhythmias  Respiratory: Bilateral ronchus, crackles, few expiratory wheezes, Normal respiratory effort. Abdomen: soft, ntnd Skin: no rash or induration seen on limited exam Musculoskeletal: grossly normal tone BUE/BLE Psychiatric: grossly normal mood and affect, speech fluent and appropriate Neurologic: grossly non-focal.          Labs on Admission:  Basic Metabolic Panel:  Recent Labs Lab 09/29/13 0302  NA 130*  K 5.3*  CL 93*  CO2 18*  GLUCOSE 386*  BUN 68*  CREATININE 3.80*  CALCIUM 8.1*   Liver Function Tests:  Recent Labs Lab 09/29/13 0302  AST 58*  ALT 36  ALKPHOS 166*  BILITOT 1.6*  PROT 6.5  ALBUMIN 1.8*   No results found for this basename: LIPASE, AMYLASE,  in the last 168 hours No results found for this basename: AMMONIA,  in the last 168 hours CBC:  Recent Labs Lab 09/29/13 0302  WBC 13.7*  NEUTROABS 11.2*  HGB 13.0  HCT 38.4*  MCV 96.5  PLT 129*   Cardiac Enzymes:  Recent Labs Lab 09/29/13 0302  CKTOTAL 706*  CKMB 4.5*  TROPONINI <0.30    BNP (last 3 results)  Recent Labs  09/29/13 0302  PROBNP 12088.0*   CBG:  Recent Labs Lab 09/29/13 0337 09/29/13 0755  GLUCAP  338* 384*    Radiological Exams on Admission: Dg Chest 2 View  09/29/2013   CLINICAL DATA:  Cough and back pain.  History of smoking.  EXAM: CHEST  2 VIEW  COMPARISON:  Chest radiograph performed 07/30/2011  FINDINGS: The lungs are well-aerated. Right mid lung and right basilar airspace opacities raise concern for mild pneumonia. Pulmonary vascularity is at the upper limits of normal. There is no evidence of pleural effusion or pneumothorax.  The heart is mildly enlarged. A pacemaker/AICD is noted at the left chest wall, with leads ending at the right atrium, right ventricle  and coronary sinus. No acute osseous abnormalities are seen.  IMPRESSION: 1. Mild multifocal right-sided pneumonia noted. 2. Mild cardiomegaly.   Electronically Signed   By: Roanna Raider M.D.   On: 09/29/2013 04:04   Dg Lumbar Spine Complete  09/29/2013   CLINICAL DATA:  Lower back pain.  EXAM: LUMBAR SPINE - COMPLETE 4+ VIEW  COMPARISON:  None.  FINDINGS: There is no evidence of fracture or subluxation. Vertebral bodies demonstrate normal height. There is mild apparent chronic grade 1 anterolisthesis of L4 on L5, reflecting facet disease. Mild disc space narrowing is noted at L4-L5. There is partial sacralization of vertebral body L5.  The visualized bowel gas pattern is unremarkable in appearance; air and stool are noted within the colon. The sacroiliac joints are within normal limits. Diffuse vascular calcifications are seen.  IMPRESSION: 1. No evidence of fracture or subluxation along the lumbar spine. 2. Mild degenerative change noted at the lower lumbar spine. 3. Diffuse vascular calcifications seen.   Electronically Signed   By: Roanna Raider M.D.   On: 09/29/2013 04:06    EKG: Independently reviewed. Repeat EKG, no available.   Assessment/Plan Active Problems:   Chronic systolic heart failure   Sepsis   Acute renal failure   Cardiomyopathy   Diabetes mellitus   Metabolic acidosis   Hyperkalemia  1-SIRS, early  sepsis: patient presents with hypotension, leukocytosis, increase lactic acid. PNA on chest x ray. Admit to step down unit. IV fluids. IV ceftriaxone and Zithromax. Tamiflu to cover for influenza. Blood culture, cortisol level order. Has received 1.5 L IV fluids. If hypotension reoccurs will consult CCM.   2-Multifocal PNA; continue with Ceftriaxone and Azithromycin. Blood culture. Sputum culture.   3-Acute on chronic renal failure: prior Cr range 1.4 to 1.5. in setting infection, hypotension, decrease volume. IV fluids.   4-Increase troponin: point of care marker. Cycle troponin. EKG pacer rhythm. Will order ECHO.   5-Metabolic acidosis: multifactorial , sepsis renal failure,. IV fluids.  6-Diabetes, uncontrolled. Increase anion gap. Ketone in urine. Ph 7.4. Will treat as DKA, IV fluids, Insulin Gtt.  7-Diarrhea: IV fluids, check for C. Diff.  8-Hyperkalemia: IV fluids. He is having diarrhea no need for kayexalate. On IV insulin Gtt.    Code Status: Presume Full Code.  Family Communication: Care discussed with wife at bedside.  Disposition Plan: expect 4 to 5 days inpatient.   Time spent: 75 minutes.   [facility] Pager [phone]

## 2013-09-29 NOTE — ED Notes (Signed)
Pt through face mast away while being transport to 2H.

## 2013-09-29 NOTE — Progress Notes (Signed)
PHARMACIST - PHYSICIAN COMMUNICATION DR: Sunnie Nielsen CONCERNING:  Oseltamivir 30 mg capsule shortage  DESCRIPTION:  [facility] is experiencing a significant shortage of Oseltamivir 30mg  capsules.    This patient has an order for Oseltamivir (Tamiflu) and has a CrCl of ~31 ml/min The package insert for Oseltamivir recommends 30mg  BID x 5 days for patients with CrCl 30-60 ml/min.  To preserve an adequate supply of 30mg  capsules for our patients with more significant renal impairment the Infectious Disease team has recommended to substitute Oseltamivir 75mg  qday x 5 days for patients with CrCl 30-60 ml/min at this time.   RECOMMENDATION: Oseltamivir 75mg  PO DAILY x 5 days has been substituted for your patient. If you have any questions about this temporary substitution please feel free to call the Pharmacy at [phone] for assistance.  Tyrone Nine. Artelia Laroche, PharmD Clinical Pharmacist - Resident Pager: [phone] Phone: [phone] 09/29/2013 8:17 AM

## 2013-09-30 DIAGNOSIS — I059 Rheumatic mitral valve disease, unspecified: Secondary | ICD-10-CM

## 2013-09-30 LAB — GLUCOSE, CAPILLARY
Glucose-Capillary: 127 mg/dL — ABNORMAL HIGH (ref 70–99)
Glucose-Capillary: 132 mg/dL — ABNORMAL HIGH (ref 70–99)
Glucose-Capillary: 136 mg/dL — ABNORMAL HIGH (ref 70–99)
Glucose-Capillary: 137 mg/dL — ABNORMAL HIGH (ref 70–99)
Glucose-Capillary: 139 mg/dL — ABNORMAL HIGH (ref 70–99)
Glucose-Capillary: 139 mg/dL — ABNORMAL HIGH (ref 70–99)
Glucose-Capillary: 174 mg/dL — ABNORMAL HIGH (ref 70–99)
Glucose-Capillary: 183 mg/dL — ABNORMAL HIGH (ref 70–99)
Glucose-Capillary: 184 mg/dL — ABNORMAL HIGH (ref 70–99)
Glucose-Capillary: 189 mg/dL — ABNORMAL HIGH (ref 70–99)
Glucose-Capillary: 195 mg/dL — ABNORMAL HIGH (ref 70–99)
Glucose-Capillary: 270 mg/dL — ABNORMAL HIGH (ref 70–99)
Glucose-Capillary: 336 mg/dL — ABNORMAL HIGH (ref 70–99)

## 2013-09-30 LAB — BASIC METABOLIC PANEL
BUN: 71 mg/dL — ABNORMAL HIGH (ref 6–23)
CO2: 19 mEq/L (ref 19–32)
CO2: 19 mEq/L (ref 19–32)
Calcium: 8.1 mg/dL — ABNORMAL LOW (ref 8.4–10.5)
Calcium: 7.9 mg/dL — ABNORMAL LOW (ref 8.4–10.5)
Chloride: 100 mEq/L (ref 96–112)
Chloride: 102 mEq/L (ref 96–112)
Creatinine, Ser: 2.6 mg/dL — ABNORMAL HIGH (ref 0.50–1.35)
Creatinine, Ser: 2.39 mg/dL — ABNORMAL HIGH (ref 0.50–1.35)
GFR calc Af Amer: 30 mL/min — ABNORMAL LOW (ref >90)
GFR calc Af Amer: 33 mL/min — ABNORMAL LOW (ref >90)
Potassium: 4.3 mEq/L (ref 3.5–5.1)
Sodium: 132 mEq/L — ABNORMAL LOW (ref 135–145)

## 2013-09-30 LAB — EXPECTORATED SPUTUM ASSESSMENT W GRAM STAIN, RFLX TO RESP C: Special Requests: NORMAL

## 2013-09-30 LAB — URINE CULTURE

## 2013-09-30 LAB — CBC
HCT: 37 % — ABNORMAL LOW (ref 39.0–52.0)
MCV: 94.9 fL (ref 78.0–100.0)
Platelets: 114 10*3/uL — ABNORMAL LOW (ref 150–400)
RBC: 3.9 MIL/uL — ABNORMAL LOW (ref 4.22–5.81)
RDW: 14.7 % (ref 11.5–15.5)
WBC: 5 10*3/uL (ref 4.0–10.5)

## 2013-09-30 LAB — HEPARIN LEVEL (UNFRACTIONATED): Heparin Unfractionated: 0.11 IU/mL — ABNORMAL LOW (ref 0.30–0.70)

## 2013-09-30 LAB — CORTISOL-AM, BLOOD: Cortisol - AM: 23.1 ug/dL — ABNORMAL HIGH (ref 4.3–22.4)

## 2013-09-30 MED ORDER — INSULIN ASPART 100 UNIT/ML ~~LOC~~ SOLN: 0.0000 [IU] | Freq: Three times a day (TID) | SUBCUTANEOUS | Status: DC

## 2013-09-30 MED ORDER — DEXTROSE 5 % IV SOLN
500.0000 mg | INTRAVENOUS | Status: DC
  Administered 2013-09-30 – 2013-10-02 (×3): 500 mg via INTRAVENOUS
  Filled 2013-09-30 (×5): qty 500

## 2013-09-30 MED ORDER — MORPHINE SULFATE 2 MG/ML IJ SOLN: 1.0000 mg | INTRAMUSCULAR | Status: DC | PRN

## 2013-09-30 MED ORDER — HEPARIN (PORCINE) IN NACL 100-0.45 UNIT/ML-% IJ SOLN
2200.0000 [IU]/h | INTRAMUSCULAR | Status: DC
  Administered 2013-09-30: 1650 [IU]/h via INTRAVENOUS
  Administered 2013-09-30: 1900 [IU]/h via INTRAVENOUS
  Filled 2013-09-30 (×6): qty 250

## 2013-09-30 MED ORDER — INSULIN ASPART 100 UNIT/ML ~~LOC~~ SOLN
0.0000 [IU] | Freq: Three times a day (TID) | SUBCUTANEOUS | Status: DC
  Administered 2013-10-01: 4 [IU] via SUBCUTANEOUS
  Administered 2013-10-01: 15 [IU] via SUBCUTANEOUS
  Administered 2013-10-01: 20 [IU] via SUBCUTANEOUS
  Administered 2013-10-02: 5 [IU] via SUBCUTANEOUS
  Administered 2013-10-03: 7 [IU] via SUBCUTANEOUS
  Administered 2013-10-03: 4 [IU] via SUBCUTANEOUS

## 2013-09-30 MED ORDER — ALBUTEROL SULFATE (2.5 MG/3ML) 0.083% IN NEBU
2.5000 mg | INHALATION_SOLUTION | RESPIRATORY_TRACT | Status: DC | PRN
  Administered 2013-10-01 – 2013-10-02 (×6): 2.5 mg via RESPIRATORY_TRACT
  Filled 2013-09-30: qty 0.5
  Filled 2013-09-30: qty 3
  Filled 2013-09-30 (×2): qty 0.5
  Filled 2013-09-30: qty 3
  Filled 2013-09-30: qty 0.5
  Filled 2013-09-30 (×2): qty 3

## 2013-09-30 MED ORDER — INSULIN ASPART 100 UNIT/ML ~~LOC~~ SOLN
0.0000 [IU] | Freq: Every day | SUBCUTANEOUS | Status: DC
  Administered 2013-09-30: 4 [IU] via SUBCUTANEOUS

## 2013-09-30 MED ORDER — INSULIN ASPART 100 UNIT/ML ~~LOC~~ SOLN
0.0000 [IU] | Freq: Three times a day (TID) | SUBCUTANEOUS | Status: DC
  Administered 2013-09-30: 15 [IU] via SUBCUTANEOUS
  Administered 2013-09-30: 7 [IU] via SUBCUTANEOUS

## 2013-09-30 MED ORDER — CARVEDILOL 3.125 MG PO TABS
3.1250 mg | ORAL_TABLET | Freq: Two times a day (BID) | ORAL | Status: DC
  Administered 2013-09-30 – 2013-10-04 (×8): 3.125 mg via ORAL
  Filled 2013-09-30 (×11): qty 1

## 2013-09-30 MED ORDER — MEXILETINE HCL 150 MG PO CAPS
150.0000 mg | ORAL_CAPSULE | Freq: Three times a day (TID) | ORAL | Status: DC
  Administered 2013-09-30 – 2013-10-04 (×13): 150 mg via ORAL
  Filled 2013-09-30 (×15): qty 1

## 2013-09-30 NOTE — Progress Notes (Signed)
Echo Lab  2D Echocardiogram completed.  Lindalee Huizinga L Janeva Peaster, RDCS 09/30/2013 9:21 AM

## 2013-09-30 NOTE — Progress Notes (Signed)
ANTICOAGULATION CONSULT NOTE - Follow Up Consult  Pharmacy Consult for heparin Indication: ventricular clot noted on TTE   Allergies  Allergen Reactions  . Ace Inhibitors     AVOID ALL  BECAUSE OF HEART DISEASE   . Nitroglycerin Other (See Comments)    Reaction unknown  . Procan Sr [Procainamide Hcl]     PT. HAS HEART DISEASE CALLED HYPERTHROPHIC CARDIOMYOPATHY  . Quinidine     PT. HAS HEART DISEASE CALLED HYPERTHROPHIC CARDIOMYOPATHY      Patient Measurements: Height: 6\' 1"  (185.4 cm) Weight: 304 lb 3.8 oz (138 kg) IBW/kg (Calculated) : 79.9 Heparin Dosing Weight: 111 kg  Vital Signs: Temp: 97.6 F (36.4 C) (12/28 2003) Temp src: Oral (12/28 2003) BP: 149/81 mmHg (12/28 1945) Pulse Rate: 62 (12/28 1149)  Labs:  Recent Labs  09/29/13 0302  09/29/13 1000 09/29/13 1050  09/29/13 1308  09/29/13 2056 09/30/13 0117 09/30/13 0515 09/30/13 1835  HGB 13.0  --   --  11.4*  --   --   --   --   --  11.8*  --   HCT 38.4*  --   --  33.5*  --   --   --   --   --  37.0*  --   PLT 129*  --   --  121*  --   --   --   --   --  114*  --   HEPARINUNFRC  --   --   --   --   --   --   --   --   --   --  0.11*  CREATININE 3.80*  < >  --   --   < >  --   < > 2.80* 2.60* 2.39*  --   CKTOTAL 706*  --   --   --   --   --   --   --   --   --   --   CKMB 4.5*  --   --   --   --   --   --   --   --   --   --   TROPONINI <0.30  --  <0.30  --   --  <0.30  --  <0.30  --   --   --   < > = values in this interval not displayed.  Estimated Creatinine Clearance: 49.7 ml/min (by C-G formula based on Cr of 2.39).   Assessment: Patient is a 57 y.o M on heparin for LV apical clot.   First heparin level now back subtherapeutic at 0.11.  Per RN, no issues with IV line and no bleeding noted.   Goal of Therapy:  Heparin level 0.3-0.7 units/ml Monitor platelets by anticoagulation protocol: Yes   Plan:  1) increase heparin drip to 1900 units/hr 2) check 6 hour heparin level  Callan Norden  P 09/30/2013,9:25 PM

## 2013-09-30 NOTE — Progress Notes (Signed)
ANTICOAGULATION CONSULT NOTE - Initial Consult  Pharmacy Consult for heparin Indication: ventricular clot noted on TTE  Allergies  Allergen Reactions  . Ace Inhibitors     AVOID ALL  BECAUSE OF HEART DISEASE   . Nitroglycerin Other (See Comments)    Reaction unknown  . Procan Sr [Procainamide Hcl]     PT. HAS HEART DISEASE CALLED HYPERTHROPHIC CARDIOMYOPATHY  . Quinidine     PT. HAS HEART DISEASE CALLED HYPERTHROPHIC CARDIOMYOPATHY      Patient Measurements: Height: 6\' 1"  (185.4 cm) Weight: 304 lb 3.8 oz (138 kg) IBW/kg (Calculated) : 79.9 Heparin Dosing Weight: 111 kg  Vital Signs: Temp: 97.9 F (36.6 C) (12/28 0817) Temp src: Oral (12/28 0817) BP: 141/90 mmHg (12/28 0817) Pulse Rate: 61 (12/28 0817)  Labs:  Recent Labs  09/29/13 0302  09/29/13 1000 09/29/13 1050  09/29/13 1308  09/29/13 2056 09/30/13 0117 09/30/13 0515  HGB 13.0  --   --  11.4*  --   --   --   --   --  11.8*  HCT 38.4*  --   --  33.5*  --   --   --   --   --  37.0*  PLT 129*  --   --  121*  --   --   --   --   --  114*  CREATININE 3.80*  < >  --   --   < >  --   < > 2.80* 2.60* 2.39*  CKTOTAL 706*  --   --   --   --   --   --   --   --   --   CKMB 4.5*  --   --   --   --   --   --   --   --   --   TROPONINI <0.30  --  <0.30  --   --  <0.30  --  <0.30  --   --   < > = values in this interval not displayed.  Estimated Creatinine Clearance: 49.7 ml/min (by C-G formula based on Cr of 2.39).   Medical History: Past Medical History  Diagnosis Date  . DM   . HYPERLIPIDEMIA-MIXED   . Obesity, unspecified   . HYPERTENSION, UNSPECIFIED   . MYOCARDIAL INFARCTION     LAD 50% to 60% stenosis, OM 150% stenosis, OM 240% stenosis, right coronary artery 40% stenosis, 11/12  . Hypertrophic obstructive cardiomyopathy     Poor acoustic windows on echo 2010  . CARDIOMYOPATHY, SECONDARY   . AV BLOCK, COMPLETE   . RENAL INSUFFICIENCY   . AUTOMATIC IMPLANTABLE CARDIAC DEFIBRILLATOR SITU   . Blockage of  coronary artery of heart   . BPH (benign prostatic hypertrophy)   . Colon polyps   . Ventricular tachycardia     Medications:  Scheduled:  . ipratropium  0.5 mg Nebulization Q4H   And  . albuterol  2.5 mg Nebulization Q4H  . amiodarone  200 mg Oral Custom  . [START ON 10/01/2013] amiodarone  400 mg Oral Custom  . antiseptic oral rinse  15 mL Mouth Rinse BID  . aspirin  325 mg Oral Daily  . atorvastatin  20 mg Oral q1800  . azithromycin  500 mg Intravenous Q24H  . cefTRIAXone (ROCEPHIN)  IV  1 g Intravenous Q24H  . docusate sodium  100 mg Oral BID  . folic acid  1 mg Oral Daily  . insulin regular  0-10 Units  Intravenous TID WC  . levothyroxine  150 mcg Oral QAC breakfast  . methylPREDNISolone (SOLU-MEDROL) injection  60 mg Intravenous Q12H  . oseltamivir  75 mg Oral Daily  . thiamine  100 mg Oral Daily   Infusions:  . sodium chloride Stopped (09/29/13 1639)  . dextrose 5 % and 0.45% NaCl 100 mL/hr at 09/30/13 0542  . insulin (NOVOLIN-R) infusion 14.7 Units/hr (09/30/13 0900)    Assessment: 72 male with ventricular clot note on TTE will be started on heparin therapy.  Baseline Hgb 11.8 and Plt 114 K.  Patient's SCr is 2.39.  Not on any anticoagulation prior to admission.  Had heparin 5000 units SQ x1 at 0542 on 09/30/13 Goal of Therapy:  Heparin level 0.3-0.7 units/ml Monitor platelets by anticoagulation protocol: Yes   Plan:  1) Start heparin drip at 1650 units/hr 2) 8hr heparin level 3) Daily heparin level and CBC  Sopheap Basic, Tsz-Yin 09/30/2013,9:19 AM

## 2013-09-30 NOTE — Progress Notes (Signed)
[facility] Progress Note [place] TEAM 1 - Stepdown/ICU TEAM   [name] Sr. ZOX:[id number] DOB: 02-11-1956 DOA: 09/29/2013 PCP: [name], MD  Admit HPI / Brief Narrative: 57 y.o. male with DM, HTN, CAD, history of MI, ischemic cardiomyopathy, CKD cr range 1.4 to 1.5 who presented complaining of generalized weakness w/  dyspnea on exertion. He fell in the lobby of the hospital because of generalized weakness. He had been having diarrhea for 3 days. He related a productive cough, and dyspnea for 2 weeks.   HPI/Subjective: Pt is alert.  States he feels much better already. Denies cp, sob, f/c, or abdom pain at the present time.    Assessment/Plan:  Sepsis (pCO2 26.8 - WBC 13.7 - pyelo) Sepsis physiology is improving   Pyelonephritis Cont empiric abx - f/u culture data - if slow to improve will obtain US to r/o abscess  Diarrhea C diff negative - likely due to pyelo   Newly appreciated LV apical clot Full anticoag - discuss w/ his Cardiologist during this hospital stay   Mild rhabdo/myositis Due to above - very gently hydrate and follow  Severe acute on mild chronic renal failure prior Cr range 1.4 to 1.5 - crt 3.8 at presentation - improving w/ hydration   Hyponatremia Due to hypovolemia - improving w/ volume resuscitation  Hyperkalemia Due to acute renal failure - resolved   Metabolic acidosis / lactic acidosis Resolving w/ volume expansion  ?Multilobar CAP Doubt pt has true pulmonary infection - follow clinically - f/u CXR in 48hrs   HOCM w/ systolic CHF as well as grade 2 diastolic CHF EF 20-25% via TTE this admit   Hx of CHB + Hx of ventricular tachycardia - s/p AICD (Dr. [name])  DM Well controlled at present   HLD  CAD - diffuse via cath 2012  Code Status: FULL Family Communication: no family present at time of exam Disposition Plan: SDU  Consultants: none  Procedures: TTE - 12/28 - apical LV clot - mutliple WMA - EF  20-25%  Antibiotics: Azithro 12/26 >>  Rocephin 12/26 >>  DVT prophylaxis: IV heparin  Objective: Blood pressure 141/90, pulse 61, temperature 97.9 F (36.6 C), temperature source Oral, resp. rate 20, height 6\' 1"  (1.854 m), weight 138 kg (304 lb 3.8 oz), SpO2 98.00%.  Intake/Output Summary (Last 24 hours) at 09/30/13 1104 Last data filed at 09/30/13 1000  Gross per 24 hour  Intake   2979 ml  Output   1251 ml  Net   1728 ml   Exam: General: No acute respiratory distress at rest  Lungs: Clear to auscultation bilaterally without wheezes or crackles - bs distant  Cardiovascular: Regular rate - HS very distant - no appreciable M Abdomen: obese, nontender, nondistended, soft, bowel sounds positive, no rebound, no ascites, no appreciable mass Extremities: No significant cyanosis, clubbing, or edema bilateral lower extremities  Data Reviewed: Basic Metabolic Panel:  Recent Labs Lab 09/29/13 1305 09/29/13 1815 09/29/13 2056 09/30/13 0117 09/30/13 0515  NA 132* 134* 132* 132* 133*  K 4.5 5.0 4.4 4.5 4.3  CL 99 99 100 100 102  CO2 19 20 18* 19 19  GLUCOSE 262* 201* 194* 212* 138*  BUN 75* 75* 74* 71* 70*  CREATININE 3.30* 2.94* 2.80* 2.60* 2.39*  CALCIUM 7.8* 8.0* 7.8* 8.1* 7.9*   Liver Function Tests:  Recent Labs Lab 09/29/13 0302  AST 58*  ALT 36  ALKPHOS 166*  BILITOT 1.6*  PROT 6.5  ALBUMIN 1.8*  No results found for this basename: LIPASE, AMYLASE,  in the last 168 hours No results found for this basename: AMMONIA,  in the last 168 hours  CBC:  Recent Labs Lab 09/29/13 0302 09/29/13 1050 09/30/13 0515  WBC 13.7* 10.7* 5.0  NEUTROABS 11.2*  --   --   HGB 13.0 11.4* 11.8*  HCT 38.4* 33.5* 37.0*  MCV 96.5 94.9 94.9  PLT 129* 121* 114*   Cardiac Enzymes:  Recent Labs Lab 09/29/13 0302 09/29/13 1000 09/29/13 1308 09/29/13 2056  CKTOTAL 706*  --   --   --   CKMB 4.5*  --   --   --   TROPONINI <0.30 <0.30 <0.30 <0.30   BNP (last 3  results)  Recent Labs  09/29/13 0302  PROBNP 12088.0*   CBG:  Recent Labs Lab 09/30/13 0111 09/30/13 0201 09/30/13 0300 09/30/13 0403 09/30/13 0509  GLUCAP 207* 165* 134* 137* 127*    Recent Results (from the past 240 hour(s))  MRSA PCR SCREENING     Status: None   Collection Time    09/29/13  6:11 AM      Result Value Range Status   MRSA by PCR NEGATIVE  NEGATIVE Final   Comment:            The GeneXpert MRSA Assay (FDA     approved for NASAL specimens     only), is one component of a     comprehensive MRSA colonization     surveillance program. It is not     intended to diagnose MRSA     infection nor to guide or     monitor treatment for     MRSA infections.     Studies:  Recent x-ray studies have been reviewed in detail by the Attending Physician  Scheduled Meds:  Scheduled Meds: . ipratropium  0.5 mg Nebulization Q4H   And  . albuterol  2.5 mg Nebulization Q4H  . amiodarone  200 mg Oral Custom  . [START ON 10/01/2013] amiodarone  400 mg Oral Custom  . antiseptic oral rinse  15 mL Mouth Rinse BID  . aspirin  325 mg Oral Daily  . atorvastatin  20 mg Oral q1800  . azithromycin  500 mg Intravenous Q24H  . cefTRIAXone (ROCEPHIN)  IV  1 g Intravenous Q24H  . docusate sodium  100 mg Oral BID  . folic acid  1 mg Oral Daily  . insulin regular  0-10 Units Intravenous TID WC  . levothyroxine  150 mcg Oral QAC breakfast  . methylPREDNISolone (SOLU-MEDROL) injection  60 mg Intravenous Q12H  . oseltamivir  75 mg Oral Daily  . thiamine  100 mg Oral Daily    Time spent on care of this patient: 35 mins   [facility] T  [facility] Office  [phone] Pager - Text Page per Loretha Stapler as per below:  On-Call/Text Page:      [email or web address]      password [email or web address]  If 7PM-7AM, please contact night-coverage [email or web address] Password [email or web address] 09/30/2013, 11:04 AM   LOS: 1 day

## 2013-10-01 ENCOUNTER — Encounter (HOSPITAL_COMMUNITY): Payer: Self-pay | Admitting: Physician Assistant

## 2013-10-01 ENCOUNTER — Inpatient Hospital Stay (HOSPITAL_COMMUNITY): Payer: BC Managed Care – PPO

## 2013-10-01 DIAGNOSIS — I509 Heart failure, unspecified: Secondary | ICD-10-CM

## 2013-10-01 DIAGNOSIS — I5023 Acute on chronic systolic (congestive) heart failure: Secondary | ICD-10-CM

## 2013-10-01 DIAGNOSIS — I442 Atrioventricular block, complete: Secondary | ICD-10-CM

## 2013-10-01 DIAGNOSIS — J9600 Acute respiratory failure, unspecified whether with hypoxia or hypercapnia: Secondary | ICD-10-CM

## 2013-10-01 DIAGNOSIS — Z95810 Presence of automatic (implantable) cardiac defibrillator: Secondary | ICD-10-CM

## 2013-10-01 DIAGNOSIS — I429 Cardiomyopathy, unspecified: Secondary | ICD-10-CM

## 2013-10-01 DIAGNOSIS — I428 Other cardiomyopathies: Secondary | ICD-10-CM

## 2013-10-01 LAB — COMPREHENSIVE METABOLIC PANEL
ALT: 56 U/L — ABNORMAL HIGH (ref 0–53)
AST: 164 U/L — ABNORMAL HIGH (ref 0–37)
Albumin: 1.7 g/dL — ABNORMAL LOW (ref 3.5–5.2)
Alkaline Phosphatase: 274 U/L — ABNORMAL HIGH (ref 39–117)
BUN: 82 mg/dL — ABNORMAL HIGH (ref 6–23)
CO2: 17 mEq/L — ABNORMAL LOW (ref 19–32)
Calcium: 8.5 mg/dL (ref 8.4–10.5)
Chloride: 95 mEq/L — ABNORMAL LOW (ref 96–112)
Creatinine, Ser: 2.2 mg/dL — ABNORMAL HIGH (ref 0.50–1.35)
GFR calc Af Amer: 36 mL/min — ABNORMAL LOW (ref >90)
GFR calc non Af Amer: 31 mL/min — ABNORMAL LOW (ref >90)
Glucose, Bld: 399 mg/dL — ABNORMAL HIGH (ref 70–99)
Potassium: 5.2 mEq/L — ABNORMAL HIGH (ref 3.5–5.1)
Sodium: 127 mEq/L — ABNORMAL LOW (ref 135–145)
Total Bilirubin: 0.7 mg/dL (ref 0.3–1.2)
Total Protein: 6.4 g/dL (ref 6.0–8.3)

## 2013-10-01 LAB — PROTIME-INR
INR: 1.37 (ref 0.00–1.49)
Prothrombin Time: 16.5 seconds — ABNORMAL HIGH (ref 11.6–15.2)

## 2013-10-01 LAB — CBC
HCT: 36.7 % — ABNORMAL LOW (ref 39.0–52.0)
Hemoglobin: 12.5 g/dL — ABNORMAL LOW (ref 13.0–17.0)
MCH: 32.6 pg (ref 26.0–34.0)
MCHC: 34.1 g/dL (ref 30.0–36.0)
MCV: 95.8 fL (ref 78.0–100.0)
Platelets: 144 10*3/uL — ABNORMAL LOW (ref 150–400)
RBC: 3.83 MIL/uL — ABNORMAL LOW (ref 4.22–5.81)
RDW: 14.7 % (ref 11.5–15.5)
WBC: 9.5 10*3/uL (ref 4.0–10.5)

## 2013-10-01 LAB — GLUCOSE, CAPILLARY
Glucose-Capillary: 304 mg/dL — ABNORMAL HIGH (ref 70–99)
Glucose-Capillary: 181 mg/dL — ABNORMAL HIGH (ref 70–99)

## 2013-10-01 LAB — HEMOGLOBIN A1C
Hgb A1c MFr Bld: 7.1 % — ABNORMAL HIGH (ref <5.7)
Mean Plasma Glucose: 157 mg/dL — ABNORMAL HIGH (ref <117)

## 2013-10-01 LAB — HEPARIN LEVEL (UNFRACTIONATED)
Heparin Unfractionated: 0.18 IU/mL — ABNORMAL LOW (ref 0.30–0.70)
Heparin Unfractionated: 0.42 IU/mL (ref 0.30–0.70)

## 2013-10-01 LAB — PRO B NATRIURETIC PEPTIDE: Pro B Natriuretic peptide (BNP): 19037 pg/mL — ABNORMAL HIGH (ref 0–125)

## 2013-10-01 MED ORDER — WARFARIN - PHARMACIST DOSING INPATIENT: Freq: Every day | Status: DC

## 2013-10-01 MED ORDER — ASPIRIN EC 81 MG PO TBEC
81.0000 mg | DELAYED_RELEASE_TABLET | Freq: Every day | ORAL | Status: DC
  Administered 2013-10-01 – 2013-10-04 (×4): 81 mg via ORAL
  Filled 2013-10-01 (×4): qty 1

## 2013-10-01 MED ORDER — INSULIN GLARGINE 100 UNIT/ML ~~LOC~~ SOLN
40.0000 [IU] | Freq: Two times a day (BID) | SUBCUTANEOUS | Status: DC
  Administered 2013-10-01 – 2013-10-04 (×7): 40 [IU] via SUBCUTANEOUS
  Filled 2013-10-01 (×8): qty 0.4

## 2013-10-01 MED ORDER — SODIUM CHLORIDE 0.9 % IV SOLN: INTRAVENOUS | Status: DC

## 2013-10-01 MED ORDER — FUROSEMIDE 10 MG/ML IJ SOLN
40.0000 mg | Freq: Once | INTRAMUSCULAR | Status: AC
  Administered 2013-10-01: 40 mg via INTRAVENOUS
  Filled 2013-10-01: qty 4

## 2013-10-01 MED ORDER — WARFARIN SODIUM 7.5 MG PO TABS
7.5000 mg | ORAL_TABLET | Freq: Once | ORAL | Status: AC
  Administered 2013-10-01: 7.5 mg via ORAL
  Filled 2013-10-01: qty 1

## 2013-10-01 MED ORDER — INSULIN ASPART 100 UNIT/ML ~~LOC~~ SOLN
10.0000 [IU] | Freq: Three times a day (TID) | SUBCUTANEOUS | Status: DC
  Administered 2013-10-01 – 2013-10-02 (×3): 10 [IU] via SUBCUTANEOUS
  Administered 2013-10-03: 5 [IU] via SUBCUTANEOUS

## 2013-10-01 MED ORDER — WARFARIN VIDEO: Freq: Once | Status: DC

## 2013-10-01 MED ORDER — COUMADIN BOOK
Freq: Once | Status: AC
  Administered 2013-10-01: 17:00:00
  Filled 2013-10-01: qty 1

## 2013-10-01 MED ORDER — FUROSEMIDE 10 MG/ML IJ SOLN
40.0000 mg | Freq: Once | INTRAMUSCULAR | Status: AC
  Administered 2013-10-01: 40 mg via INTRAVENOUS

## 2013-10-01 MED ORDER — HEPARIN (PORCINE) IN NACL 100-0.45 UNIT/ML-% IJ SOLN
2250.0000 [IU]/h | INTRAMUSCULAR | Status: DC
  Administered 2013-10-01 – 2013-10-04 (×4): 2250 [IU]/h via INTRAVENOUS
  Filled 2013-10-01 (×6): qty 250

## 2013-10-01 NOTE — Progress Notes (Signed)
Placed pt. On venturi mask at 50% due to pt. desating.

## 2013-10-01 NOTE — Progress Notes (Signed)
At 12:00AM pt c/o SOB, Sats=93% on O2 @ 2 L/min. V/S stable. B/L Expiratory wheezes noted.  Dr. York Spaniel informed. RT made aware. Albuterol tx given 2x 4 hours apart as ordered. IVF D/C'd at 02:40 AM. Lasix 40 mg given @ 02:54 am as ordered. Had 800 ml total urine output after Lasix. At approx. 05:10 AM pt noted desats=86 % on O2 @ 2 L/min. Pt stated he was fine not until he uses the urinal. Increased O2 to 6L/min without success. RTchanged nasal cannula to VM 50% pt sats=94%. Dr. York Spaniel informed. Stat chest X-ray done as ordered. Will continue to monitor pt.

## 2013-10-01 NOTE — Progress Notes (Addendum)
ANTICOAGULATION CONSULT NOTE - Initial Consult  Pharmacy Consult:  Coumadin Indication:  LV thrombus  Allergies  Allergen Reactions  . Ace Inhibitors     AVOID ALL  BECAUSE OF HEART DISEASE   . Nitroglycerin Other (See Comments)    Reaction unknown  . Procan Sr [Procainamide Hcl]     PT. HAS HEART DISEASE CALLED HYPERTHROPHIC CARDIOMYOPATHY  . Quinidine     PT. HAS HEART DISEASE CALLED HYPERTHROPHIC CARDIOMYOPATHY      Patient Measurements: Height: 6\' 1"  (185.4 cm) Weight: 307 lb 15.7 oz (139.7 kg) IBW/kg (Calculated) : 79.9 Heparin Dosing Weight: 111 kg  Vital Signs: Temp: 97.6 F (36.4 C) (12/29 1215) Temp src: Oral (12/29 1215) BP: 156/92 mmHg (12/29 1215) Pulse Rate: 60 (12/29 1215)  Labs:  Recent Labs  09/29/13 0302  09/29/13 1000 09/29/13 1050  09/29/13 1308  09/29/13 2056 09/30/13 0117 09/30/13 0515 09/30/13 1835 10/01/13 0431 10/01/13 1245  HGB 13.0  --   --  11.4*  --   --   --   --   --  11.8*  --  12.5*  --   HCT 38.4*  --   --  33.5*  --   --   --   --   --  37.0*  --  36.7*  --   PLT 129*  --   --  121*  --   --   --   --   --  114*  --  144*  --   HEPARINUNFRC  --   --   --   --   --   --   --   --   --   --  0.11* 0.18* 0.42  CREATININE 3.80*  < >  --   --   < >  --   < > 2.80* 2.60* 2.39*  --  2.20*  --   CKTOTAL 706*  --   --   --   --   --   --   --   --   --   --   --   --   CKMB 4.5*  --   --   --   --   --   --   --   --   --   --   --   --   TROPONINI <0.30  --  <0.30  --   --  <0.30  --  <0.30  --   --   --   --   --   < > = values in this interval not displayed.  Estimated Creatinine Clearance: 54.4 ml/min (by C-G formula based on Cr of 2.2).   Medical History: Past Medical History  Diagnosis Date  . DM   . HYPERLIPIDEMIA-MIXED   . Obesity, unspecified   . HYPERTENSION, UNSPECIFIED   . CAD (coronary artery disease)     a. Nonobstructive moderate CAD by cath - last 2012.  [name] Hypertrophic obstructive cardiomyopathy   . Complete  heart block     a. s/p pacemaker later upgraded to CRT-D.  [name] CKD (chronic kidney disease)     a. Baseline Cr 1.4-1.5.  [name] Chronic systolic CHF (congestive heart failure)     a. NICM (EF 25% in 2012, 20-25% in 09/2013). b. s/p BiV-ICD (MDT).  . Colon polyps   . Ventricular tachycardia     a. EPS/RFCA of VT in 2012. b. On amio, mexilitene. c. Last BiV-ICD gen change 05/2013 (MDT).  [name]  Hypothyroidism   . Fatty liver disease, nonalcoholic   . BPH (benign prostatic hyperplasia)   . Habitual alcohol use        Assessment: 57 YOM with LV clot started on IV heparin and now to add Coumadin.  Baseline INR not available; however, patient was not on anticoagulation PTA and is currently on IV heparin, therefore, will postpone lab until next blood draw.  Available labs reviewed.   Goal of Therapy:  INR 2-3 Heparin level 0.3-0.7 units/ml Monitor platelets by anticoagulation protocol: Yes    Plan:  - Coumadin 7.5mg  PO today - Check PT / INR with confirmatory heparin level - Daily PT / INR - Coumadin book / video    Cielo Arias D. Laney Potash, PharmD, BCPS Pager:  [phone] 10/01/2013, 4:34 PM   ====================================  Addendum: HL = 0.37 (goal 0.3 - 0.7 units/mL) Heparin dosing weight = 111 kg Baseline INR 1.37   Plan: - Increase heparin gtt to 2250 units/hr - F/U AM labs    Azzie Thiem D. Laney Potash, PharmD, BCPS Pager:  [phone] 10/01/2013, 7:35 PM

## 2013-10-01 NOTE — Care Management Note (Addendum)
    Page 1 of 1   10/03/2013     11:36:54 AM   CARE MANAGEMENT NOTE 10/03/2013  Patient:  [name], [name]   Account Number:  [id number]  Date Initiated:  10/01/2013  Documentation initiated by:  [name]  Subjective/Objective Assessment:   adm w pneumonia     Action/Plan:   lives w wife, pcp [name]   Anticipated DC Date:     Anticipated DC Plan:        DC Planning Services  CM consult      Choice offered to / List presented to:             Status of service:  Completed, signed off Medicare Important Message given?   (If response is "NO", the following Medicare IM given date fields will be blank) Date Medicare IM given:   Date Additional Medicare IM given:    Discharge Disposition:  HOME/SELF CARE  Per UR Regulation:  Reviewed for med. necessity/level of care/duration of stay  If discussed at Long Length of Stay Meetings, dates discussed:    Comments:  10-03-13 [address] [name], [place] [phone] CM did touch base with pt in regards to needing [facility] services for RN with medicaiton/disease management. Pt states he will not need a RN at this time. Wife assists him with all needs. No needs from CM at this time.

## 2013-10-01 NOTE — Progress Notes (Signed)
ANTICOAGULATION CONSULT NOTE - Follow Up Consult  Pharmacy Consult for Heparin  Indication: LV clot on TTE  Allergies  Allergen Reactions  . Ace Inhibitors     AVOID ALL  BECAUSE OF HEART DISEASE   . Nitroglycerin Other (See Comments)    Reaction unknown  . Procan Sr [Procainamide Hcl]     PT. HAS HEART DISEASE CALLED HYPERTHROPHIC CARDIOMYOPATHY  . Quinidine     PT. HAS HEART DISEASE CALLED HYPERTHROPHIC CARDIOMYOPATHY     Patient Measurements: Height: 6\' 1"  (185.4 cm) Weight: 307 lb 15.7 oz (139.7 kg) IBW/kg (Calculated) : 79.9 Heparin Dosing Weight: ~111 kg   Labs:  Recent Labs  09/29/13 0302  09/29/13 1000 09/29/13 1050  09/29/13 1308  09/29/13 2056 09/30/13 0117 09/30/13 0515 09/30/13 1835 10/01/13 0431 10/01/13 1245  HGB 13.0  --   --  11.4*  --   --   --   --   --  11.8*  --  12.5*  --   HCT 38.4*  --   --  33.5*  --   --   --   --   --  37.0*  --  36.7*  --   PLT 129*  --   --  121*  --   --   --   --   --  114*  --  144*  --   HEPARINUNFRC  --   --   --   --   --   --   --   --   --   --  0.11* 0.18* 0.42  CREATININE 3.80*  < >  --   --   < >  --   < > 2.80* 2.60* 2.39*  --  2.20*  --   CKTOTAL 706*  --   --   --   --   --   --   --   --   --   --   --   --   CKMB 4.5*  --   --   --   --   --   --   --   --   --   --   --   --   TROPONINI <0.30  --  <0.30  --   --  <0.30  --  <0.30  --   --   --   --   --   < > = values in this interval not displayed.  Estimated Creatinine Clearance: 54.4 ml/min (by C-G formula based on Cr of 2.2).  Medications:  Heparin 2200 units/hr  Assessment: 57 y/o M on heparin for LV clot. HL is 0.42 and at goal on 2200 units/hr.   Goal of Therapy:  Heparin level 0.3-0.7 units/ml Monitor platelets by anticoagulation protocol: Yes   Plan:  -No heparin changes needed -Repeat heparin level in 6 hours to confirm -Will follow for oral anticoagulation plans  Harland German, Pharm D 10/01/2013 2:20 PM

## 2013-10-01 NOTE — Progress Notes (Signed)
[facility] Progress Note [place] TEAM 1 - Stepdown/ICU TEAM   [name] Sr. ZOX:[id number] DOB: [birth year] DOA: 09/29/2013 PCP: [name], MD  Admit HPI / Brief Narrative: 57 y.o. male with DM, HTN, CAD, history of MI, ischemic cardiomyopathy, CKD cr range 1.4 to 1.5 who presented complaining of generalized weakness w/  dyspnea on exertion. He fell in the lobby of the hospital because of generalized weakness. He had been having diarrhea for 3 days. He related a productive cough, and dyspnea for 2 weeks.   HPI/Subjective: Pt endorses fatigue and some worsening of resp status- states did not sleep last night- productive cough..    Assessment/Plan:  Sepsis  -(pCO2 26.8 - WBC 13.7 - pyelo - probable PNA) -Sepsis physiology is slowly improving   Pyelonephritis -Cont empiric abx and f/u culture data  - if slow to improve will obtain US to r/o abscess  Diarrhea -C diff negative so  likely due to pyelo   Newly appreciated LV apical clot -EF 20-25% via TTE this admit - consult Cards -Full anticoag   Acute respiratory failure due to: A) HOCM w/ progressive Systolic CHF as well as grade 2 diastolic CHF B) Suspected CAP -developed worsened hypoxia after hydration overnight so IVFs held and was given 1 x dose of Lasix -diuresed 2200 cc since -am CXR looks c/w PNA process as well as edema and pt also coughing up thick tan sputum -cont empiric anbxs and gently diurese  CAD - diffuse via cath 2012 -? Progressive systolic dysfunction 2/2 ischemia  Mild rhabdo/myositis -Due to above  -very gentle hydration stopped due to concerns of CHF - follow  Severe acute on mild chronic renal failure -prior Cr range 1.4 to 1.5 -crt 3.8 at presentation  -improved slightly w/ hydration -may be more relational to progressive systolic dysfunction/low perfusion state   Diabetes Mellitus-uncontrolled -poorly controlled- at times > 400 -cont high dose Lantus with SSI -add meal  coverage -HgbA1c 7.9  Hyponatremia -Due to hypovolemia and hyperglycemia - improved w/ volume resuscitation  Hyperkalemia -Due to acute renal failure - stable  Metabolic acidosis / lactic acidosis -persistent in setting of ARF -follow  Hypothyroidism -cont Synthroid  Hx of CHB + Hx of ventricular tachycardia - s/p AICD (Dr. [name]) -cont Amiodarone  HLD  Code Status: FULL Family Communication: discussed w/ pt and wife at bedside Disposition Plan: SDU  Consultants: Cardiology   Procedures: TTE - 12/28 - apical LV clot - mutliple WMA - EF 20-25%  Antibiotics: Azithro 12/26 >>  Rocephin 12/26 >>  DVT prophylaxis: IV heparin  Objective: Blood pressure 156/92, pulse 60, temperature 97.6 F (36.4 C), temperature source Oral, resp. rate 20, height 6\' 1"  (1.854 m), weight 307 lb 15.7 oz (139.7 kg), SpO2 93.00%.  Intake/Output Summary (Last 24 hours) at 10/01/13 1257 Last data filed at 10/01/13 0900  Gross per 24 hour  Intake 1714.17 ml  Output   1951 ml  Net -236.83 ml   Exam: General: mild resp distress laying in bed  Lungs: distant bs th/o all fields - diffuse crackles to mid fields B  Cardiovascular: Regular rate - HS very distant - no appreciable M Abdomen: obese, nontender, nondistended, soft, bowel sounds positive, no rebound, no ascites, no appreciable mass Extremities: No significant cyanosis, or clubbing;  1+ edema bilateral lower extremities  Data Reviewed: Basic Metabolic Panel:  Recent Labs Lab 09/29/13 1815 09/29/13 2056 09/30/13 0117 09/30/13 0515 10/01/13 0431  NA 134* 132* 132* 133* 127*  K  5.0 4.4 4.5 4.3 5.2*  CL 99 100 100 102 95*  CO2 20 18* 19 19 17*  GLUCOSE 201* 194* 212* 138* 399*  BUN 75* 74* 71* 70* 82*  CREATININE 2.94* 2.80* 2.60* 2.39* 2.20*  CALCIUM 8.0* 7.8* 8.1* 7.9* 8.5   Liver Function Tests:  Recent Labs Lab 09/29/13 0302 10/01/13 0431  AST 58* 164*  ALT 36 56*  ALKPHOS 166* 274*  BILITOT 1.6* 0.7  PROT  6.5 6.4  ALBUMIN 1.8* 1.7*   CBC:  Recent Labs Lab 09/29/13 0302 09/29/13 1050 09/30/13 0515 10/01/13 0431  WBC 13.7* 10.7* 5.0 9.5  NEUTROABS 11.2*  --   --   --   HGB 13.0 11.4* 11.8* 12.5*  HCT 38.4* 33.5* 37.0* 36.7*  MCV 96.5 94.9 94.9 95.8  PLT 129* 121* 114* 144*   Cardiac Enzymes:  Recent Labs Lab 09/29/13 0302 09/29/13 1000 09/29/13 1308 09/29/13 2056  CKTOTAL 706*  --   --   --   CKMB 4.5*  --   --   --   TROPONINI <0.30 <0.30 <0.30 <0.30   BNP (last 3 results)  Recent Labs  09/29/13 0302 10/01/13 0431  PROBNP 12088.0* 19037.0*   CBG:  Recent Labs Lab 09/30/13 1213 09/30/13 1626 09/30/13 2135 10/01/13 0815 10/01/13 1214  GLUCAP 207* 341* 336* 424* 304*    Recent Results (from the past 240 hour(s))  CULTURE, BLOOD (ROUTINE X 2)     Status: None   Collection Time    09/29/13  2:50 AM      Result Value Range Status   Specimen Description BLOOD RIGHT ARM   Final   Special Requests BOTTLES DRAWN AEROBIC ONLY 9CC   Final   Culture  Setup Time     Final   Value: 09/29/2013 12:50     Performed at [facility]   Culture     Final   Value:        BLOOD CULTURE RECEIVED NO GROWTH TO DATE CULTURE WILL BE HELD FOR 5 DAYS BEFORE ISSUING A FINAL NEGATIVE REPORT     Performed at [facility]   Report Status PENDING   Incomplete  CULTURE, BLOOD (ROUTINE X 2)     Status: None   Collection Time    09/29/13  3:00 AM      Result Value Range Status   Specimen Description BLOOD LEFT ARM   Final   Special Requests BOTTLES DRAWN AEROBIC AND ANAEROBIC 10CC EACH   Final   Culture  Setup Time     Final   Value: 09/29/2013 12:50     Performed at [facility]   Culture     Final   Value:        BLOOD CULTURE RECEIVED NO GROWTH TO DATE CULTURE WILL BE HELD FOR 5 DAYS BEFORE ISSUING A FINAL NEGATIVE REPORT     Performed at [facility]   Report Status PENDING   Incomplete  URINE CULTURE     Status: None   Collection Time     09/29/13  3:49 AM      Result Value Range Status   Specimen Description URINE, CLEAN CATCH   Final   Special Requests NONE   Final   Culture  Setup Time     Final   Value: 09/29/2013 14:18     Performed at [facility] Count     Final   Value: 20,OOO COLONIES/ML  Performed at [facility]     Final   Value: Multiple bacterial morphotypes present, none predominant. Suggest appropriate recollection if clinically indicated.     Performed at [facility]   Report Status 09/30/2013 FINAL   Final  MRSA PCR SCREENING     Status: None   Collection Time    09/29/13  6:11 AM      Result Value Range Status   MRSA by PCR NEGATIVE  NEGATIVE Final   Comment:            The GeneXpert MRSA Assay (FDA     approved for NASAL specimens     only), is one component of a     comprehensive MRSA colonization     surveillance program. It is not     intended to diagnose MRSA     infection nor to guide or     monitor treatment for     MRSA infections.  CLOSTRIDIUM DIFFICILE BY PCR     Status: None   Collection Time    09/30/13  5:13 AM      Result Value Range Status   C difficile by pcr NEGATIVE  NEGATIVE Final  CULTURE, EXPECTORATED SPUTUM-ASSESSMENT     Status: None   Collection Time    09/30/13  9:21 AM      Result Value Range Status   Specimen Description SPUTUM   Final   Special Requests Normal   Final   Sputum evaluation     Final   Value: THIS SPECIMEN IS ACCEPTABLE. RESPIRATORY CULTURE REPORT TO FOLLOW.   Report Status 09/30/2013 FINAL   Final  CULTURE, RESPIRATORY (NON-EXPECTORATED)     Status: None   Collection Time    09/30/13  9:21 AM      Result Value Range Status   Specimen Description SPUTUM   Final   Special Requests NONE   Final   Gram Stain PENDING   Incomplete   Culture     Final   Value: Culture reincubated for better growth     Performed at [facility]   Report Status PENDING   Incomplete     Studies:  Recent  x-ray studies have been reviewed in detail by the Attending Physician  Scheduled Meds:  Scheduled Meds: . amiodarone  200 mg Oral Custom  . amiodarone  400 mg Oral Custom  . antiseptic oral rinse  15 mL Mouth Rinse BID  . atorvastatin  20 mg Oral q1800  . azithromycin  500 mg Intravenous Q24H  . carvedilol  3.125 mg Oral BID WC  . cefTRIAXone (ROCEPHIN)  IV  1 g Intravenous Q24H  . folic acid  1 mg Oral Daily  . insulin aspart  0-20 Units Subcutaneous TID WC  . insulin aspart  0-5 Units Subcutaneous QHS  . insulin aspart  10 Units Subcutaneous TID WC  . insulin glargine  40 Units Subcutaneous BID  . levothyroxine  150 mcg Oral QAC breakfast  . mexiletine  150 mg Oral Q8H  . thiamine  100 mg Oral Daily    Time spent on care of this patient: 35 mins   ELLIS,ALLISON L.  [facility] Office  [phone] Pager - Text Page per Loretha Stapler as per below:  On-Call/Text Page:      [email or web address]      password [email or web address]  If 7PM-7AM, please contact night-coverage [email or web address] Password [email or web address] 10/01/2013, 12:57 PM   LOS: 2 days   I have personally  examined this patient and reviewed the entire database. I have reviewed the above note, made any necessary editorial changes, and agree with its content.  Lonia Blood, MD [facility]

## 2013-10-01 NOTE — Progress Notes (Signed)
ANTICOAGULATION CONSULT NOTE - Follow Up Consult  Pharmacy Consult for Heparin  Indication: LV clot on TTE  Allergies  Allergen Reactions  . Ace Inhibitors     AVOID ALL  BECAUSE OF HEART DISEASE   . Nitroglycerin Other (See Comments)    Reaction unknown  . Procan Sr [Procainamide Hcl]     PT. HAS HEART DISEASE CALLED HYPERTHROPHIC CARDIOMYOPATHY  . Quinidine     PT. HAS HEART DISEASE CALLED HYPERTHROPHIC CARDIOMYOPATHY     Patient Measurements: Height: 6\' 1"  (185.4 cm) Weight: 307 lb 15.7 oz (139.7 kg) IBW/kg (Calculated) : 79.9 Heparin Dosing Weight: ~111 kg   Labs:  Recent Labs  09/29/13 0302  09/29/13 1000 09/29/13 1050  09/29/13 1308  09/29/13 2056 09/30/13 0117 09/30/13 0515 09/30/13 1835 10/01/13 0431  HGB 13.0  --   --  11.4*  --   --   --   --   --  11.8*  --  12.5*  HCT 38.4*  --   --  33.5*  --   --   --   --   --  37.0*  --  36.7*  PLT 129*  --   --  121*  --   --   --   --   --  114*  --  144*  HEPARINUNFRC  --   --   --   --   --   --   --   --   --   --  0.11* 0.18*  CREATININE 3.80*  < >  --   --   < >  --   < > 2.80* 2.60* 2.39*  --   --   CKTOTAL 706*  --   --   --   --   --   --   --   --   --   --   --   CKMB 4.5*  --   --   --   --   --   --   --   --   --   --   --   TROPONINI <0.30  --  <0.30  --   --  <0.30  --  <0.30  --   --   --   --   < > = values in this interval not displayed.  Estimated Creatinine Clearance: 50.1 ml/min (by C-G formula based on Cr of 2.39).  Medications:  Heparin 1900 units/hr  Assessment: 57 y/o M on heparin for LV clot. HL is 0.18. Other labs as above. No issues per RN.   Goal of Therapy:  Heparin level 0.3-0.7 units/ml Monitor platelets by anticoagulation protocol: Yes   Plan:  -Increase heparin to 2200 units/hr -6 hour HL at 1300 -Daily CBC/HL -Monitor for bleeding  Thank you for allowing me to take part in this patient's care,  Abran Duke, PharmD Clinical Pharmacist Phone: [phone] Pager:  [phone] 10/01/2013 6:20 AM

## 2013-10-01 NOTE — Consult Note (Signed)
Cardiology Consultation Note  Patient ID: Dakota Ingwersen., MRN: [id number], DOB/AGE: 11/14/1955 57 y.o. Admit date: 09/29/2013   Date of Consult: 10/01/2013 Primary Physician: [name], MD Primary Cardiologist: Antoine Poche, [place] [name]  Chief Complaint: weakness, SOB Reason for Consult: apical thrombus on echo  HPI: [name] is a 57 y/o M with history of NICM/chronic systolic CHF s/p CRT-D (EF 25% in 2012, mod nonobst CAD), 30+yr former tobacco, habitual alcohol use, CHB, HOCM, DM, VT s/p ablation (on amio/mex) who presented to [facility] 09/29/2013 with generalized weakness and dyspnea. 2 weeks ago he began feeling progressively more SOB to the point where he was barely able to get around. He also felt weak with tan productive cough, diarrhea x 3 days, and chills. Symptom onset coincides about the time that he spent time with a sick grandson. On admission he was found to have acute resp failure requiring supplemental O2. He was found to have acute on chronic renal insufficiency with BUN/Cr 68/3.8 (baseline 1.4-1.5), metabolic acidosis with multiple lab abnormalities including hyponatremia, hyperkalemia, hyperglycemia, hypoalbuminemia, and elevated lactic acid and WBC. He was admitted for sepsis, pyelonephritis, and possible multifocal PNA. He is being treated with antibiotics. He was given hydration for his sepsis, AKI, mild rhabdo/myositis. He developed worsened hypoxia after hydration overnight so was given 1 dose of IV Lasix with -2200. He continues to have expiratory wheezes as well and f/u CXR showed concern for worsening pneumonia. POC troponin was 0.15 but next regular sets were neg x 4. Flu, C-diff negative. He denies CP, palpitations. Endorses LEE and some orthopnea. Stopped taking his medicines several days prior to admission due to feeling bad, and self-discontinued his insulin due to low readings at home. He thought he was losing weight although weight is actually up this  admission from 3/9.  2D echo 09/30/13 with poor sound transmission did show a thrombus in the region of the apex and apical septal wall, measuring2.0 x 1.5 cm. EF 20-25%, grade 2 d/d, RMWA as described below, mild MR, mod dilated LA, poorly visualized RV, mildly dilated RA. pBNP 19k today. Cr improving but still 2.2.   Wt Readings from Last 3 Encounters:  10/01/13 307 lb 15.7 oz (139.7 kg)  09/11/13 303 lb 9.6 oz (137.712 kg)  07/26/13 319 lb (144.697 kg)    Past Medical History  Diagnosis Date  . DM   . HYPERLIPIDEMIA-MIXED   . Obesity, unspecified   . HYPERTENSION, UNSPECIFIED   . CAD (coronary artery disease)     a. Nonobstructive moderate CAD by cath - last 2012.  [name] Hypertrophic obstructive cardiomyopathy   . Complete heart block     a. s/p pacemaker later upgraded to CRT-D.  [name] CKD (chronic kidney disease)     a. Baseline Cr 1.4-1.5.  [name] Chronic systolic CHF (congestive heart failure)     a. NICM (EF 25% in 2012, 20-25% in 09/2013). b. s/p BiV-ICD (MDT).  . Colon polyps   . Ventricular tachycardia     a. EPS/RFCA of VT in 2012. b. On amio, mexilitene. c. Last BiV-ICD gen change 05/2013 (MDT).  . Hypothyroidism   . Fatty liver disease, nonalcoholic   . BPH (benign prostatic hyperplasia)   . Habitual alcohol use       Most Recent Cardiac Studies: 2D Echo 09/30/13 Study Conclusions  - Procedure narrative: Transthoracic echocardiography. Image quality was suboptimal, with poor sound transmission. - Left ventricle: A thrombus is seen in the region of the apex  and apical septal wall, measuring2.0 x 1.5 cm. The cavity size was mildly dilated. Wall thickness was increased in a pattern of mild LVH. Systolic function was severely reduced. The estimated ejection fraction was in the range of 20% to 25%. Features are consistent with a pseudonormal left ventricular filling pattern, with concomitant abnormal relaxation and increased filling pressure (grade 2 diastolic dysfunction). Doppler  parameters are consistent with high ventricular filling pressure. - Regional wall motion abnormality: Akinesis of the apical septal and apical myocardium; severe hypokinesis of the apical lateral myocardium; moderate hypokinesis of the mid inferoseptal and mid anterolateral myocardium. - Mitral valve: Calcified annulus. Mildly thickened leaflets . Mild regurgitation. - Left atrium: The atrium was moderately dilated. - Right ventricle: Poorly visualized. Unable to assess systolic function. The cavity size was mildly dilated. Pacer wire or catheter noted in right ventricle. - Right atrium: The atrium was mildly dilated. - Tricuspid valve: Inadequate spectral Doppler profile to accurately assess pulmonary pressures. Impressions: - A thrombus is seen in the region of the apex and apical septal wall, measuring 2.0 x 1.5 cm. Systolic function is severely reduced, EF 20-25%.  2D Echo 07/2011 (under Notes -> Echo Report) - Left ventricle: The cavity size was normal. Wall thickness   was increased in a pattern of severe LVH. Systolic   function was severely reduced. The estimated ejection   fraction was in the range of 25% to 30%. Diffuse   hypokinesis. There is akinesis of the apical myocardium.   Doppler parameters are consistent with abnormal left   ventricular relaxation (grade 1 diastolic dysfunction). - Left atrium: The atrium was mildly dilated.  Cardiac Cath 07/2011 PROCEDURE PERFORMED:  1. Left heart catheterization.  2. Right heart catheterization.  3. Selective coronary angiography.  OPERATOR: Verne Carrow, MD  INDICATION: This is a 57 year old Caucasian male with a history of nonischemic cardiomyopathy with ejection fraction of 25% to 30% who has a defibrillator in place. He is admitted with recurrent ventricular tachycardia. Cardiac catheterization was arranged today to assess his volume status as well as to rule out an ischemic focus for his  ventricular tachycardia.  DETAILS  OF PROCEDURE: The patient was brought to the main cardiac catheterization laboratory after signing informed consent for the procedure. The right groin was prepped and draped in sterile fashion. 1% lidocaine was used for local anesthesia. A 5-French sheath was inserted into the right femoral artery without difficulty. A 7-French sheath was inserted into the right femoral vein withoutdifficulty. Right heart catheterization was performed with a balloon-tipped catheter. Standard diagnostic catheters were used to perform selective coronary angiography. A pigtail catheter was used to measure left  ventricular pressures. No left ventricular angiogram was performed.  The patient tolerated the procedure well. There were no immediate complications.  HEMODYNAMIC FINDINGS: Central aortic pressure 111/70. Left ventricular pressure 106/16/21. Right atrial pressure 14. Right ventricular pressure 51/10/20. Pulmonary artery 54/28 with a mean of 38. Pulmonary capillary wedge pressure, mean of 24. Cardiac output 4.8 L/minute. Cardiac index 1.98 L/minute per meter squared. Pulmonary artery saturation 59%. Central aortic saturation 94%.  ANGIOGRAPHIC FINDINGS:  1. The left main coronary artery had no evidence of disease.  2. The left anterior descending was a large vessel that coursed to the apex and gave off a moderate-sized diagonal branch. The proximal LAD had a 50% stenosis. The mid LAD had a 60% stenosis. This appeared grossly unchanged from his prior catheterization 2 years ago.  3. The circumflex artery is a large-caliber vessel that gives  off a small to moderate-sized first obtuse marginal branch. The first obtuse marginal branch has a 50% ostial stenosis. There is a 40% stenosis in the second obtuse marginal branch. There is no flow- limiting lesions noted in this system. This is unchanged from prior catheterization.  4. The right coronary artery is a large dominant vessel with 40% mid stenosis.  5. No left ventricular  angiogram was performed.  IMPRESSION:  1. Moderate nonobstructive coronary artery disease with no change from the catheterization in 2010.  2. Ventricular tachycardia secondary to nonischemic cardiomyopathy with no focal coronary stenosis.  RECOMMENDATIONS: At this point, I recommend electrophysiology evaluation for potential ventricular tachycardia ablation.    Surgical History:  Past Surgical History  Procedure Laterality Date  . Pacemaker insertion      medtronic  . Cardiac catheterization    . Facial reconstruction surgery      after trauma in 2006     Home Meds: Prior to Admission medications   Medication Sig Start Date End Date Taking? Authorizing Provider  amiodarone (PACERONE) 400 MG tablet Take 200-400 mg by mouth daily. Take 200mg  on Sat and Sun take 400mg  all other days 09/11/13  Yes Marinus Maw, MD  aspirin 325 MG tablet Take 325 mg by mouth daily.     Yes Historical Provider, MD  carvedilol (COREG) 25 MG tablet TAKE 1 TABLET TWICE A DAY 07/09/13  Yes Rollene Rotunda, MD  cholecalciferol (VITAMIN D) 1000 UNITS tablet Take 2,000 Units by mouth daily.    Yes Historical Provider, MD  colesevelam (WELCHOL) 625 MG tablet Take 625 mg by mouth 3 (three) times daily.    Yes Historical Provider, MD  fish oil-omega-3 fatty acids 1000 MG capsule Take 3 capsules by mouth daily.    Yes Historical Provider, MD  furosemide (LASIX) 40 MG tablet Take 1 tablet (40 mg total) by mouth daily. 07/26/13  Yes Ernestina Penna, MD  glimepiride (AMARYL) 4 MG tablet Take 1 tablet (4 mg total) by mouth 2 (two) times daily. 06/13/13  Yes Ernestina Penna, MD  insulin glargine (LANTUS) 100 UNIT/ML injection Inject 0.5 mLs (50 Units total) into the skin 2 (two) times daily. 05/24/13  Yes Tammy Eckard, PHARMD  insulin lispro (HUMALOG KWIKPEN) 100 UNIT/ML SOPN Inject 20 units with largest/evening meal Dispense quikpens 05/24/13  Yes Tammy Eckard, PHARMD  levothyroxine (SYNTHROID, LEVOTHROID) 150 MCG tablet Take 1  tablet (150 mcg total) by mouth daily. 08/02/13  Yes Tammy Eckard, PHARMD  lisinopril (PRINIVIL,ZESTRIL) 2.5 MG tablet Take 1 tablet (2.5 mg total) by mouth 2 (two) times daily. 06/13/13  Yes Ernestina Penna, MD  mexiletine (MEXITIL) 150 MG capsule Take 1 capsule (150 mg total) by mouth every 8 (eight) hours. 07/26/13  Yes Ernestina Penna, MD  rosuvastatin (CRESTOR) 20 MG tablet Take 0.5 tablets (10 mg total) by mouth daily. 03/20/13  Yes Ernestina Penna, MD  sitaGLIPtin (JANUVIA) 50 MG tablet Take 1 tablet (50 mg total) by mouth daily. 09/03/13  Yes Henrene Pastor, PHARMD    Inpatient Medications:  . amiodarone  200 mg Oral Custom  . amiodarone  400 mg Oral Custom  . antiseptic oral rinse  15 mL Mouth Rinse BID  . atorvastatin  20 mg Oral q1800  . azithromycin  500 mg Intravenous Q24H  . carvedilol  3.125 mg Oral BID WC  . cefTRIAXone (ROCEPHIN)  IV  1 g Intravenous Q24H  . folic acid  1 mg Oral Daily  . insulin aspart  0-20 Units Subcutaneous TID WC  . insulin aspart  0-5 Units Subcutaneous QHS  . insulin aspart  10 Units Subcutaneous TID WC  . insulin glargine  40 Units Subcutaneous BID  . levothyroxine  150 mcg Oral QAC breakfast  . mexiletine  150 mg Oral Q8H  . thiamine  100 mg Oral Daily   . heparin 2,200 Units/hr (10/01/13 1610)    Allergies:  Allergies  Allergen Reactions  . Ace Inhibitors     AVOID ALL  BECAUSE OF HEART DISEASE   . Nitroglycerin Other (See Comments)    Reaction unknown  . Procan Sr [Procainamide Hcl]     PT. HAS HEART DISEASE CALLED HYPERTHROPHIC CARDIOMYOPATHY  . Quinidine     PT. HAS HEART DISEASE CALLED HYPERTHROPHIC CARDIOMYOPATHY      History   Social History  . Marital Status: Married    Spouse Name: N/A    Number of Children: N/A  . Years of Education: N/A   Occupational History  . Not on file.   Social History Main Topics  . Smoking status: Former Smoker    Types: Cigarettes    Quit date: 10/04/1968  . Smokeless tobacco: Never Used      Comment: 30+ yrs  . Alcohol Use: Yes     Comment: about 1 cup of liquor daily (straight liquor - not mixed)  . Drug Use: No  . Sexual Activity: Not on file   Other Topics Concern  . Not on file   Social History Narrative  . No narrative on file     Family History  Problem Relation Age of Onset  . Diabetic kidney disease Father   . Coronary artery disease Neg Hx      Review of Systems: General: negative for chills Cardiovascular: see above Dermatological: negative for rash Respiratory: ++++cough, wheezing Urologic: negative for hematuria Abdominal: negative for nausea, vomiting, diarrhea, bright red blood per rectum, melena, or hematemesis Neurologic: negative for syncope All other systems reviewed and are otherwise negative except as noted above.  Labs:  Recent Labs  09/29/13 0302 09/29/13 1000 09/29/13 1308 09/29/13 2056  CKTOTAL 706*  --   --   --   CKMB 4.5*  --   --   --   TROPONINI <0.30 <0.30 <0.30 <0.30   Lab Results  Component Value Date   WBC 9.5 10/01/2013   HGB 12.5* 10/01/2013   HCT 36.7* 10/01/2013   MCV 95.8 10/01/2013   PLT 144* 10/01/2013     Recent Labs Lab 10/01/13 0431  NA 127*  K 5.2*  CL 95*  CO2 17*  BUN 82*  CREATININE 2.20*  CALCIUM 8.5  PROT 6.4  BILITOT 0.7  ALKPHOS 274*  ALT 56*  AST 164*  GLUCOSE 399*   Lab Results  Component Value Date   CHOL 122 07/26/2013   LDLCALC 116* 01/30/2013   TRIG 194* 01/30/2013   Radiology/Studies:  Dg Chest 2 View 09/29/2013   CLINICAL DATA:  Cough and back pain.  History of smoking.  EXAM: CHEST  2 VIEW  COMPARISON:  Chest radiograph performed 07/30/2011  FINDINGS: The lungs are well-aerated. Right mid lung and right basilar airspace opacities raise concern for mild pneumonia. Pulmonary vascularity is at the upper limits of normal. There is no evidence of pleural effusion or pneumothorax.  The heart is mildly enlarged. A pacemaker/AICD is noted at the left chest wall, with leads ending  at the right atrium, right ventricle and coronary sinus. No acute osseous  abnormalities are seen.  IMPRESSION: 1. Mild multifocal right-sided pneumonia noted. 2. Mild cardiomegaly.   Electronically Signed   By: Roanna Raider M.D.   On: 09/29/2013 04:04   Dg Lumbar Spine Complete 09/29/2013   CLINICAL DATA:  Lower back pain.  EXAM: LUMBAR SPINE - COMPLETE 4+ VIEW  COMPARISON:  None.  FINDINGS: There is no evidence of fracture or subluxation. Vertebral bodies demonstrate normal height. There is mild apparent chronic grade 1 anterolisthesis of L4 on L5, reflecting facet disease. Mild disc space narrowing is noted at L4-L5. There is partial sacralization of vertebral body L5.  The visualized bowel gas pattern is unremarkable in appearance; air and stool are noted within the colon. The sacroiliac joints are within normal limits. Diffuse vascular calcifications are seen.  IMPRESSION: 1. No evidence of fracture or subluxation along the lumbar spine. 2. Mild degenerative change noted at the lower lumbar spine. 3. Diffuse vascular calcifications seen.   Electronically Signed   By: Roanna Raider M.D.   On: 09/29/2013 04:06   Dg Chest Port 1 View 10/01/2013   CLINICAL DATA:  Low oxygen saturation.  Shortness of breath.  EXAM: PORTABLE CHEST - 1 VIEW  COMPARISON:  09/29/2013  FINDINGS: Shallow inspiration. Cardiac enlargement with some prominence of pulmonary vascularity. Increasing infiltration or consolidation in the right mid and lower lung zones suggesting progression of pneumonia. Possible small pleural effusions bilaterally. No pneumothorax. Stable appearance of cardiac pacemaker.  IMPRESSION: Increasing infiltration in the right lung suggesting progression of pneumonia.   Electronically Signed   By: Burman Nieves M.D.   On: 10/01/2013 06:04   EKG: AV dual paced rhythm 62bpm  Physical Exam: Blood pressure 156/92, pulse 60, temperature 97.6 F (36.4 C), temperature source Oral, resp. rate 20, height 6\' 1"   (1.854 m), weight 307 lb 15.7 oz (139.7 kg), SpO2 93.00%. General: Morbidly obese WM in no acute distress but does become dyspneic with talking. Head: Normocephalic, atraumatic, sclera non-icteric, no xanthomas, nares are without discharge.  Neck: JVD difficult to assess due to habitus Lungs: Diffuse rhonchi R>L. Diffuse wheezing. Heart: Heart sounds quiet over loud lung sounds. RRR with S1 S2. No murmurs, rubs, or gallops appreciated. Abdomen: Soft, non-tender, non-distended with normoactive bowel sounds. No hepatomegaly. No rebound/guarding. No obvious abdominal masses. Msk:  Strength and tone appear normal for age. Extremities: No clubbing or cyanosis. 1+ LEE bilaterally.  Distal pedal pulses are 2+ and equal bilaterally. Neuro: Alert and oriented X 3. No facial asymmetry. No focal deficit. Moves all extremities spontaneously. Psych:  Responds to questions appropriately with a normal affect.   Assessment and Plan:   1. Sepsis with pyelonephritis and community acquired pneumonia 2. Acute respiratory failure, likely multifactorial 3. Mild rhabdomyolysis 4. Acute on chronic kidney insufficiency 5. Acute on chronic systolic CHF/HOCM - EF 20-25% - s/p BiV-ICD 6. LV thrombus, newly identified by echo 7. Metabolic acidosis/lactic acidosis 8. Uncontrolled diabetes mellitus 9. Rhythm: hx of VT s/p ablation, CHB s/p BiV-ICD, stable 10. Habitual alcohol use   Clinical presentation looks primarily pulmonary but agree with a component of CHF. Will give another dose of IV Lasix now and follow volume status/Cr. Check Medtronic ICD and optivol status. As for LV thrombus, we plan to ask Dr. Shirlee Latch to review to determine chronicity given known chronic LV dysfunction. Continue heparin per pharmacy until this is clarified. Recommend alcohol cessation. No ACEI/ARB due to renal dysfunction for now. Continue low dose BB for now but could consider changing to more selective  agent. Per d/w MD, will also continue  amiodarone for now for prevention of VT but may need to reconsider if status does not improve. Consider pulm consult and CIWA protocol.  Signed, Dayna Dunn PA-C 10/01/2013, 2:51 PM  I have seen, examined the patient, and reviewed the above assessment and plan.  Changes to above are made where necessary.  The patient presents with acute respiratory failure which is most prominently pneumonia/ chronic lung disease but also with a component of CHF.  BiV ICD is interrogated and reveals normal device function.  In addition, optivol has been elevated since 09/26/13 suggesting volume overload also.  Management is difficult in the setting of acute renal failure.  Will therefore cautiously diurese and follow creatinine closely.  Continue amiodarone and coreg at this time for VT.  Prognosis is guarded.  We will need to review echo to evaluate chronicity of the LV thrombus, though I am not certain that he is a good candidate for anticoagulation long term.  Co Sign: Hillis Range, MD 10/01/2013 3:36 PM

## 2013-10-01 NOTE — Progress Notes (Signed)
LV thrombus is mobile suggesting more acute than chronic (but does have some calficification). D/w MD. Start coumadin per pharmacy. Himmat Enberg PA-C

## 2013-10-02 LAB — BASIC METABOLIC PANEL
Calcium: 8.6 mg/dL (ref 8.4–10.5)
Chloride: 101 mEq/L (ref 96–112)
Creatinine, Ser: 2.06 mg/dL — ABNORMAL HIGH (ref 0.50–1.35)
GFR calc Af Amer: 39 mL/min — ABNORMAL LOW (ref >90)
GFR calc non Af Amer: 34 mL/min — ABNORMAL LOW (ref >90)

## 2013-10-02 LAB — CBC
HCT: 37 % — ABNORMAL LOW (ref 39.0–52.0)
Hemoglobin: 12.8 g/dL — ABNORMAL LOW (ref 13.0–17.0)
MCH: 32.3 pg (ref 26.0–34.0)
Platelets: 159 10*3/uL (ref 150–400)
RDW: 14.6 % (ref 11.5–15.5)
WBC: 7.2 10*3/uL (ref 4.0–10.5)

## 2013-10-02 LAB — CULTURE, RESPIRATORY W GRAM STAIN: Culture: NORMAL

## 2013-10-02 LAB — PROTIME-INR
INR: 1.45 (ref 0.00–1.49)
Prothrombin Time: 17.3 seconds — ABNORMAL HIGH (ref 11.6–15.2)

## 2013-10-02 LAB — HEPARIN LEVEL (UNFRACTIONATED): Heparin Unfractionated: 0.42 IU/mL (ref 0.30–0.70)

## 2013-10-02 LAB — GLUCOSE, CAPILLARY: Glucose-Capillary: 90 mg/dL (ref 70–99)

## 2013-10-02 LAB — CULTURE, RESPIRATORY

## 2013-10-02 MED ORDER — ALBUTEROL SULFATE (2.5 MG/3ML) 0.083% IN NEBU
2.5000 mg | INHALATION_SOLUTION | RESPIRATORY_TRACT | Status: DC | PRN
  Administered 2013-10-02 – 2013-10-03 (×4): 2.5 mg via RESPIRATORY_TRACT
  Filled 2013-10-02 (×5): qty 3

## 2013-10-02 MED ORDER — FUROSEMIDE 40 MG PO TABS
40.0000 mg | ORAL_TABLET | Freq: Every day | ORAL | Status: DC
  Administered 2013-10-02 – 2013-10-04 (×3): 40 mg via ORAL
  Filled 2013-10-02 (×3): qty 1

## 2013-10-02 MED ORDER — GUAIFENESIN ER 600 MG PO TB12
600.0000 mg | ORAL_TABLET | Freq: Two times a day (BID) | ORAL | Status: DC
  Administered 2013-10-02 – 2013-10-04 (×5): 600 mg via ORAL
  Filled 2013-10-02 (×6): qty 1

## 2013-10-02 MED ORDER — WARFARIN SODIUM 7.5 MG PO TABS
7.5000 mg | ORAL_TABLET | Freq: Once | ORAL | Status: AC
  Administered 2013-10-02: 7.5 mg via ORAL
  Filled 2013-10-02 (×2): qty 1

## 2013-10-02 NOTE — Progress Notes (Signed)
Inpatient Diabetes Program Recommendations  AACE/ADA: New Consensus Statement on Inpatient Glycemic Control (2013)  Target Ranges:  Prepandial:   less than 140 mg/dL      Peak postprandial:   less than 180 mg/dL (1-2 hours)      Critically ill patients:  140 - 180 mg/dL   Results for [name], [name] (MRN [id number]) as of 10/02/2013 14:58  Ref. Range 10/02/2013 04:55  Glucose Latest Range: 70-99 mg/dL 68 (L)   Results for [name], Dakota SR. (MRN [id number]) as of 10/02/2013 14:58  Ref. Range 10/01/2013 16:46 10/01/2013 21:32 10/02/2013 08:13 10/02/2013 12:23  Glucose-Capillary Latest Range: 70-99 mg/dL 782 (H) 956 (H) 213 (H) 90    Inpatient Diabetes Program Recommendations Insulin - Basal: Please consider decreasing evening dose of Lantus to 37 units.  Thanks, Orlando Penner, RN, MSN, CCRN Diabetes Coordinator Inpatient Diabetes Program [phone] (Team Pager) [phone] (AP office) [phone] [facility] office)

## 2013-10-02 NOTE — Progress Notes (Signed)
Pt left upper arm with large hematoma from heparin infiltrate. Pt states it has been that way for a long time. I immediately stopped infusion removed IV and called IV team. Emelda Brothers RN

## 2013-10-02 NOTE — Progress Notes (Signed)
Report called to [facility] [id number]. Patient with no compaints at the current time. Will transfer via WC.

## 2013-10-02 NOTE — Progress Notes (Signed)
SUBJECTIVE:  Breathing better today than yesterday.     PHYSICAL EXAM Filed Vitals:   10/02/13 0349 10/02/13 0400 10/02/13 0500 10/02/13 0725  BP:  130/72    Pulse:  86    Temp: 98.1 F (36.7 C)     TempSrc: Oral     Resp:  27    Height:      Weight:   302 lb 4 oz (137.1 kg)   SpO2:  97%  97%   General:  No acute distress Lungs:  Diffuse wheezing Heart:  RRR Abdomen:  Positive bowel sounds, no rebound no guarding Extremities:  Mild edema  LABS:  Results for orders placed during the hospital encounter of 09/29/13 (from the past 24 hour(s))  GLUCOSE, CAPILLARY     Status: Abnormal   Collection Time    10/01/13  8:15 AM      Result Value Range   Glucose-Capillary 424 (*) 70 - 99 mg/dL  GLUCOSE, CAPILLARY     Status: Abnormal   Collection Time    10/01/13 12:14 PM      Result Value Range   Glucose-Capillary 304 (*) 70 - 99 mg/dL  HEPARIN LEVEL (UNFRACTIONATED)     Status: None   Collection Time    10/01/13 12:45 PM      Result Value Range   Heparin Unfractionated 0.42  0.30 - 0.70 IU/mL  GLUCOSE, CAPILLARY     Status: Abnormal   Collection Time    10/01/13  4:46 PM      Result Value Range   Glucose-Capillary 181 (*) 70 - 99 mg/dL  HEPARIN LEVEL (UNFRACTIONATED)     Status: None   Collection Time    10/01/13  6:20 PM      Result Value Range   Heparin Unfractionated 0.36  0.30 - 0.70 IU/mL  PROTIME-INR     Status: Abnormal   Collection Time    10/01/13  6:20 PM      Result Value Range   Prothrombin Time 16.5 (*) 11.6 - 15.2 seconds   INR 1.37  0.00 - 1.49  GLUCOSE, CAPILLARY     Status: Abnormal   Collection Time    10/01/13  9:32 PM      Result Value Range   Glucose-Capillary 100 (*) 70 - 99 mg/dL  CBC     Status: Abnormal   Collection Time    10/02/13  4:55 AM      Result Value Range   WBC 7.2  4.0 - 10.5 K/uL   RBC 3.96 (*) 4.22 - 5.81 MIL/uL   Hemoglobin 12.8 (*) 13.0 - 17.0 g/dL   HCT 16.1 (*) 09.6 - 04.5 %   MCV 93.4  78.0 - 100.0 fL   MCH  32.3  26.0 - 34.0 pg   MCHC 34.6  30.0 - 36.0 g/dL   RDW 40.9  81.1 - 91.4 %   Platelets 159  150 - 400 K/uL  HEPARIN LEVEL (UNFRACTIONATED)     Status: None   Collection Time    10/02/13  4:55 AM      Result Value Range   Heparin Unfractionated 0.42  0.30 - 0.70 IU/mL  PROTIME-INR     Status: Abnormal   Collection Time    10/02/13  4:55 AM      Result Value Range   Prothrombin Time 17.3 (*) 11.6 - 15.2 seconds   INR 1.45  0.00 - 1.49  BASIC METABOLIC PANEL     Status: Abnormal  Collection Time    10/02/13  4:55 AM      Result Value Range   Sodium 137  137 - 147 mEq/L   Potassium 4.8  3.7 - 5.3 mEq/L   Chloride 101  96 - 112 mEq/L   CO2 20  19 - 32 mEq/L   Glucose, Bld 68 (*) 70 - 99 mg/dL   BUN 84 (*) 6 - 23 mg/dL   Creatinine, Ser 1.32 (*) 0.50 - 1.35 mg/dL   Calcium 8.6  8.4 - 44.0 mg/dL   GFR calc non Af Amer 34 (*) >90 mL/min   GFR calc Af Amer 39 (*) >90 mL/min    Intake/Output Summary (Last 24 hours) at 10/02/13 0732 Last data filed at 10/02/13 0700  Gross per 24 hour  Intake 1707.27 ml  Output   3675 ml  Net -1967.73 ml    ASSESSMENT AND PLAN:  Respiratory failure:  Multifactorial.    Good urine output yesterday and he tolerated the diuresis yesterday.  I would restart a low dose oral Lasix.    CKD:  Creat is slightly better today.   VT HISTORY:  Continue amiodarone  APICAL THROMBUS:  This will be a difficult treatment issue.  I think that it is reasonable to start with warfarin therapy.  However, if he does not comply with INR follow up as an outpatient we will need to stop this. I would use this for a limited time only (perhaps three months) at which time we can reassess for the appearance (mobile vs mural).    [name] 10/02/2013 7:32 AM

## 2013-10-02 NOTE — Progress Notes (Signed)
[facility] Progress Note  TEAM 1 - Stepdown/ICU TEAM   SCOTT VANDERVEER Sr. ZOX:[id number] DOB: July 29, 1956 DOA: 09/29/2013 PCP: [name], MD  Admit HPI / Brief Narrative: 57 y.o. male with DM, HTN, CAD, history of MI, ischemic cardiomyopathy, CKD cr range 1.4 to 1.5 who presented complaining of generalized weakness w/  dyspnea on exertion. He fell in the lobby of the hospital because of generalized weakness. He had been having diarrhea for 3 days. He related a productive cough, and dyspnea for 2 weeks.   HPI/Subjective: Pt endorses fatigue and some worsening of resp status- states did not sleep last night- productive cough..    Assessment/Plan:  Sepsis  -(pCO2 26.8 - WBC 13.7 - UTI with assoc CVAT/?pyelo - probable PNA) -Sepsis physiology is slowly improving   Pyelonephritis -Cont abx -culture data not helpful  Diarrhea -C diff negative so  likely due to UTI/pyelo   Newly appreciated LV apical clot -EF 20-25% via TTE this admit - consult Cards -Full anticoag with Coumadin for at least 3 months with FU ECHO- Cards expressing concern over pt compliance with therapy  Acute respiratory failure due to: A) HOCM w/ progressive Systolic CHF as well as grade 2 diastolic CHF B) Suspected CAP -developed worsened hypoxia after hydration so IVFs held and was given 1 x dose of Lasix -diuresed 5800 cc over past 24 hours -CXR 12/29  looked c/w PNA process as well as edema and pt also coughing up thick tan sputum -cont empiric anbxs and gently diurese-Cards transitioning to PO Lasix  CAD - diffuse via cath 2012 -? Progressive systolic dysfunction 2/2 ischemia  Mild rhabdo/myositis -Due to above  -very gentle hydration stopped due to concerns of CHF - follow  Severe acute on mild chronic renal failure -prior Cr range 1.4 to 1.5 -crt 3.8 at presentation  -improved slightly w/ hydration and further improvement seen with gentle diuresis -may be more relational to  progressive systolic dysfunction/low perfusion state   Diabetes Mellitus-uncontrolled -poorly controlled- at times > 400 -cont high dose Lantus with SSI -add meal coverage -HgbA1c 7.9  Hyponatremia -Due to hypovolemia and hyperglycemia - improved w/ volume resuscitation  Hyperkalemia -Due to acute renal failure - stable  Metabolic acidosis / lactic acidosis -persistent in setting of ARF -follow  Hypothyroidism -cont Synthroid  Hx of CHB + Hx of ventricular tachycardia - s/p AICD (Dr. [name]) -cont Amiodarone  HLD  Code Status: FULL Family Communication: discussed w/ pt and wife at bedside Disposition Plan: Transfer to Telemetry  Consultants: Cardiology   Procedures: TTE - 12/28 - apical LV clot - mutliple WMA - EF 20-25%  Antibiotics: Azithro 12/26 >>  Rocephin 12/26 >>  DVT prophylaxis: IV heparin  Objective: Blood pressure 132/82, pulse 86, temperature 97.6 F (36.4 C), temperature source Oral, resp. rate 27, height 6\' 1"  (1.854 m), weight 302 lb 4 oz (137.1 kg), SpO2 95.00%.  Intake/Output Summary (Last 24 hours) at 10/02/13 1415 Last data filed at 10/02/13 1300  Gross per 24 hour  Intake 1928.27 ml  Output   3976 ml  Net -2047.73 ml   Exam: General: mild resp distress laying in bed  Lungs: distant bs th/o all fields - diffuse crackles to mid fields B  Cardiovascular: Regular rate - HS very distant - no appreciable M Abdomen: obese, nontender, nondistended, soft, bowel sounds positive, no rebound, no ascites, no appreciable mass Extremities: No significant cyanosis, or clubbing;  1+ edema bilateral lower extremities  Data Reviewed: Basic Metabolic Panel:  Recent Labs Lab 09/29/13 2056 09/30/13 0117 09/30/13 0515 10/01/13 0431 10/02/13 0455  NA 132* 132* 133* 127* 137  K 4.4 4.5 4.3 5.2* 4.8  CL 100 100 102 95* 101  CO2 18* 19 19 17* 20  GLUCOSE 194* 212* 138* 399* 68*  BUN 74* 71* 70* 82* 84*  CREATININE 2.80* 2.60* 2.39* 2.20* 2.06*   CALCIUM 7.8* 8.1* 7.9* 8.5 8.6   Liver Function Tests:  Recent Labs Lab 09/29/13 0302 10/01/13 0431  AST 58* 164*  ALT 36 56*  ALKPHOS 166* 274*  BILITOT 1.6* 0.7  PROT 6.5 6.4  ALBUMIN 1.8* 1.7*   CBC:  Recent Labs Lab 09/29/13 0302 09/29/13 1050 09/30/13 0515 10/01/13 0431 10/02/13 0455  WBC 13.7* 10.7* 5.0 9.5 7.2  NEUTROABS 11.2*  --   --   --   --   HGB 13.0 11.4* 11.8* 12.5* 12.8*  HCT 38.4* 33.5* 37.0* 36.7* 37.0*  MCV 96.5 94.9 94.9 95.8 93.4  PLT 129* 121* 114* 144* 159   Cardiac Enzymes:  Recent Labs Lab 09/29/13 0302 09/29/13 1000 09/29/13 1308 09/29/13 2056  CKTOTAL 706*  --   --   --   CKMB 4.5*  --   --   --   TROPONINI <0.30 <0.30 <0.30 <0.30   BNP (last 3 results)  Recent Labs  09/29/13 0302 10/01/13 0431  PROBNP 12088.0* 19037.0*   CBG:  Recent Labs Lab 10/01/13 1214 10/01/13 1646 10/01/13 2132 10/02/13 0813 10/02/13 1223  GLUCAP 304* 181* 100* 102* 90    Recent Results (from the past 240 hour(s))  CULTURE, BLOOD (ROUTINE X 2)     Status: None   Collection Time    09/29/13  2:50 AM      Result Value Range Status   Specimen Description BLOOD RIGHT ARM   Final   Special Requests BOTTLES DRAWN AEROBIC ONLY 9CC   Final   Culture  Setup Time     Final   Value: 09/29/2013 12:50     Performed at [facility]   Culture     Final   Value:        BLOOD CULTURE RECEIVED NO GROWTH TO DATE CULTURE WILL BE HELD FOR 5 DAYS BEFORE ISSUING A FINAL NEGATIVE REPORT     Performed at [facility]   Report Status PENDING   Incomplete  CULTURE, BLOOD (ROUTINE X 2)     Status: None   Collection Time    09/29/13  3:00 AM      Result Value Range Status   Specimen Description BLOOD LEFT ARM   Final   Special Requests BOTTLES DRAWN AEROBIC AND ANAEROBIC 10CC EACH   Final   Culture  Setup Time     Final   Value: 09/29/2013 12:50     Performed at [facility]   Culture     Final   Value:        BLOOD CULTURE RECEIVED  NO GROWTH TO DATE CULTURE WILL BE HELD FOR 5 DAYS BEFORE ISSUING A FINAL NEGATIVE REPORT     Performed at [facility]   Report Status PENDING   Incomplete  URINE CULTURE     Status: None   Collection Time    09/29/13  3:49 AM      Result Value Range Status   Specimen Description URINE, CLEAN CATCH   Final   Special Requests NONE   Final   Culture  Setup Time  Final   Value: 09/29/2013 14:18     Performed at [facility] Count     Final   Value: 20,OOO COLONIES/ML     Performed at [facility]   Culture     Final   Value: Multiple bacterial morphotypes present, none predominant. Suggest appropriate recollection if clinically indicated.     Performed at [facility]   Report Status 09/30/2013 FINAL   Final  MRSA PCR SCREENING     Status: None   Collection Time    09/29/13  6:11 AM      Result Value Range Status   MRSA by PCR NEGATIVE  NEGATIVE Final   Comment:            The GeneXpert MRSA Assay (FDA     approved for NASAL specimens     only), is one component of a     comprehensive MRSA colonization     surveillance program. It is not     intended to diagnose MRSA     infection nor to guide or     monitor treatment for     MRSA infections.  CLOSTRIDIUM DIFFICILE BY PCR     Status: None   Collection Time    09/30/13  5:13 AM      Result Value Range Status   C difficile by pcr NEGATIVE  NEGATIVE Final  CULTURE, EXPECTORATED SPUTUM-ASSESSMENT     Status: None   Collection Time    09/30/13  9:21 AM      Result Value Range Status   Specimen Description SPUTUM   Final   Special Requests Normal   Final   Sputum evaluation     Final   Value: THIS SPECIMEN IS ACCEPTABLE. RESPIRATORY CULTURE REPORT TO FOLLOW.   Report Status 09/30/2013 FINAL   Final  CULTURE, RESPIRATORY (NON-EXPECTORATED)     Status: None   Collection Time    09/30/13  9:21 AM      Result Value Range Status   Specimen Description SPUTUM   Final   Special Requests  NONE   Final   Gram Stain     Final   Value: RARE WBC PRESENT, PREDOMINANTLY PMN     NO SQUAMOUS EPITHELIAL CELLS SEEN     NO ORGANISMS SEEN     Performed at [facility]   Culture     Final   Value: NORMAL OROPHARYNGEAL FLORA     Performed at [facility]   Report Status 10/02/2013 FINAL   Final     Studies:  Recent x-ray studies have been reviewed in detail by the Attending Physician  Scheduled Meds:  Scheduled Meds: . amiodarone  200 mg Oral Custom  . amiodarone  400 mg Oral Custom  . antiseptic oral rinse  15 mL Mouth Rinse BID  . aspirin EC  81 mg Oral Daily  . atorvastatin  20 mg Oral q1800  . azithromycin  500 mg Intravenous Q24H  . carvedilol  3.125 mg Oral BID WC  . cefTRIAXone (ROCEPHIN)  IV  1 g Intravenous Q24H  . folic acid  1 mg Oral Daily  . furosemide  40 mg Oral Daily  . guaiFENesin  600 mg Oral BID  . insulin aspart  0-20 Units Subcutaneous TID WC  . insulin aspart  0-5 Units Subcutaneous QHS  . insulin aspart  10 Units Subcutaneous TID WC  . insulin glargine  40 Units Subcutaneous BID  . levothyroxine  150 mcg Oral QAC breakfast  . mexiletine  150 mg Oral Q8H  . thiamine  100 mg Oral Daily  . warfarin  7.5 mg Oral ONCE-1800  . warfarin   Does not apply Once  . Warfarin - Pharmacist Dosing Inpatient   Does not apply q1800    Time spent on care of this patient: 35 mins   ELLIS,ALLISON L.  [facility] Office  [phone] Pager - Text Page per Loretha Stapler as per below:  On-Call/Text Page:      [email or web address]      password [email or web address]  If 7PM-7AM, please contact night-coverage [email or web address] Password [email or web address] 10/02/2013, 2:15 PM   LOS: 3 days       I have examined the patient, reviewed the chart and modified the above note which I agree with.   Deshone Lyssy,MD [phone] 10/02/2013, 6:57 PM

## 2013-10-02 NOTE — Progress Notes (Signed)
ANTICOAGULATION CONSULT NOTE - Follow Up Consult  Pharmacy Consult for Heparin, coumadin Indication: LV clot on TTE  Allergies  Allergen Reactions  . Ace Inhibitors     AVOID ALL  BECAUSE OF HEART DISEASE   . Nitroglycerin Other (See Comments)    Reaction unknown  . Procan Sr [Procainamide Hcl]     PT. HAS HEART DISEASE CALLED HYPERTHROPHIC CARDIOMYOPATHY  . Quinidine     PT. HAS HEART DISEASE CALLED HYPERTHROPHIC CARDIOMYOPATHY     Patient Measurements: Height: 6\' 1"  (185.4 cm) Weight: 302 lb 4 oz (137.1 kg) IBW/kg (Calculated) : 79.9 Heparin Dosing Weight: ~111 kg   Labs:  Recent Labs  09/29/13 1308  09/29/13 2056  09/30/13 0515  10/01/13 0431 10/01/13 1245 10/01/13 1820 10/02/13 0455  HGB  --   --   --   < > 11.8*  --  12.5*  --   --  12.8*  HCT  --   --   --   --  37.0*  --  36.7*  --   --  37.0*  PLT  --   --   --   --  114*  --  144*  --   --  159  LABPROT  --   --   --   --   --   --   --   --  16.5* 17.3*  INR  --   --   --   --   --   --   --   --  1.37 1.45  HEPARINUNFRC  --   --   --   --   --   < > 0.18* 0.42 0.36 0.42  CREATININE  --   < > 2.80*  < > 2.39*  --  2.20*  --   --  2.06*  TROPONINI <0.30  --  <0.30  --   --   --   --   --   --   --   < > = values in this interval not displayed.  Estimated Creatinine Clearance: 57.5 ml/min (by C-G formula based on Cr of 2.06).  Medications:  Heparin 2250 units/hr  Assessment: 57 y/o M on heparin for LV clot and coumadin added 09/01/13. Heparin level is 0.42 and INR is 1.45. Patient noted on amiodarone and azithromycin.  Goal of Therapy:  Heparin level 0.3-0.7 units/ml Monitor platelets by anticoagulation protocol: Yes   Plan:  -No heparin changes needed -Coumadin 7.5mg  po today and will consider a reduced dose in am -Daily PT/INR -Daily CBC and heparin level  Harland German, Pharm D 10/02/2013 11:33 AM

## 2013-10-03 LAB — BASIC METABOLIC PANEL
BUN: 61 mg/dL — ABNORMAL HIGH (ref 6–23)
CO2: 25 mEq/L (ref 19–32)
Chloride: 103 mEq/L (ref 96–112)
Creatinine, Ser: 1.6 mg/dL — ABNORMAL HIGH (ref 0.50–1.35)
Potassium: 4.6 mEq/L (ref 3.7–5.3)

## 2013-10-03 LAB — GLUCOSE, CAPILLARY
Glucose-Capillary: 95 mg/dL (ref 70–99)
Glucose-Capillary: 174 mg/dL — ABNORMAL HIGH (ref 70–99)
Glucose-Capillary: 203 mg/dL — ABNORMAL HIGH (ref 70–99)

## 2013-10-03 LAB — CBC
HCT: 36.6 % — ABNORMAL LOW (ref 39.0–52.0)
Hemoglobin: 12.4 g/dL — ABNORMAL LOW (ref 13.0–17.0)
MCH: 32.3 pg (ref 26.0–34.0)
MCHC: 33.9 g/dL (ref 30.0–36.0)
MCV: 95.3 fL (ref 78.0–100.0)

## 2013-10-03 LAB — HEPARIN LEVEL (UNFRACTIONATED): Heparin Unfractionated: 0.41 IU/mL (ref 0.30–0.70)

## 2013-10-03 MED ORDER — BLISTEX EX OINT
TOPICAL_OINTMENT | CUTANEOUS | Status: DC | PRN
  Filled 2013-10-03 (×2): qty 10

## 2013-10-03 MED ORDER — MAGIC MOUTHWASH W/LIDOCAINE
5.0000 mL | Freq: Four times a day (QID) | ORAL | Status: DC
  Administered 2013-10-03 – 2013-10-04 (×5): 5 mL via ORAL
  Filled 2013-10-03 (×8): qty 5

## 2013-10-03 MED ORDER — WARFARIN SODIUM 5 MG PO TABS
5.0000 mg | ORAL_TABLET | Freq: Once | ORAL | Status: AC
  Administered 2013-10-03: 5 mg via ORAL
  Filled 2013-10-03: qty 1

## 2013-10-03 MED ORDER — AZITHROMYCIN 500 MG PO TABS
500.0000 mg | ORAL_TABLET | Freq: Every day | ORAL | Status: DC
  Administered 2013-10-03: 500 mg via ORAL
  Filled 2013-10-03 (×2): qty 1

## 2013-10-03 NOTE — Progress Notes (Signed)
[facility] Progress Note [place] TEAM 1 - Stepdown/ICU TEAM   [name] Sr. WUJ:[id number] DOB: 1955/11/07 DOA: 09/29/2013 PCP: [name], MD  Admit HPI / Brief Narrative: 57 y.o. male with DM, HTN, CAD, history of MI, ischemic cardiomyopathy, CKD cr range 1.4 to 1.5 who presented complaining of generalized weakness w/  dyspnea on exertion. He fell in the lobby of the hospital because of generalized weakness. He had been having diarrhea for 3 days. He related a productive cough, and dyspnea for 2 weeks.   HPI/Subjective: Patient seen, no new complaints.   Assessment/Plan:  Sepsis  -(pCO2 26.8 - WBC 13.7 - UTI with assoc CVAT/?pyelo - probable PNA) -Sepsis physiology is slowly improving   Pyelonephritis -Cont abx -culture data not helpful  Diarrhea -C diff negative so  likely due to UTI/pyelo   Newly appreciated LV apical clot -EF 20-25% via TTE this admit - consult Cards -Full anticoag with Coumadin for at least 3 months with FU ECHO- Cards expressing concern over pt compliance with therapy  Acute respiratory failure due to: A) HOCM w/ progressive Systolic CHF as well as grade 2 diastolic CHF B) Suspected CAP -developed worsened hypoxia after hydration so IVFs held and was given 1 x dose of Lasix -diuresed 5800 cc over past 24 hours -CXR 12/29  looked c/w PNA process as well as edema and pt also coughing up thick tan sputum -cont empiric anbxs and gently diurese-Cards transitioning to PO Lasix  CAD - diffuse via cath 2012 -? Progressive systolic dysfunction 2/2 ischemia  Mild rhabdo/myositis -Due to above  -very gentle hydration stopped due to concerns of CHF - follow  Severe acute on mild chronic renal failure -prior Cr range 1.4 to 1.5 -crt 3.8 at presentation  -improved slightly w/ hydration and further improvement seen with gentle diuresis -may be more relational to progressive systolic dysfunction/low perfusion state   Diabetes  Mellitus-uncontrolled -poorly controlled- at times > 400 -cont high dose Lantus with SSI -add meal coverage -HgbA1c 7.9  Hyponatremia -Due to hypovolemia and hyperglycemia - improved w/ volume resuscitation  Hyperkalemia -Due to acute renal failure - stable  Metabolic acidosis / lactic acidosis -persistent in setting of ARF -follow  Hypothyroidism -cont Synthroid  Hx of CHB + Hx of ventricular tachycardia - s/p AICD (Dr. [name]) -cont Amiodarone    Code Status: FULL Family Communication: discussed w/ pt and wife at bedside Disposition Plan: Transfer to Telemetry  Consultants: Cardiology   Procedures: TTE - 12/28 - apical LV clot - mutliple WMA - EF 20-25%  Antibiotics: Azithro 12/26 >>  Rocephin 12/26 >>  DVT prophylaxis: IV heparin  Objective: Blood pressure 131/64, pulse 63, temperature 98.2 F (36.8 C), temperature source Oral, resp. rate 18, height 6\' 1"  (1.854 m), weight 136.788 kg (301 lb 9 oz), SpO2 98.00%.  Intake/Output Summary (Last 24 hours) at 10/03/13 1852 Last data filed at 10/03/13 1637  Gross per 24 hour  Intake    600 ml  Output   2725 ml  Net  -2125 ml   Exam: Physical Exam: Head: Normocephalic, atraumatic.  Eyes: No signs of jaundice, EOMI Nose: Mucous membranes dry.  Throat: Oropharynx nonerythematous, no exudate appreciated.  Neck: supple,No deformities, masses, or tenderness noted. Lungs: Normal respiratory effort. B/L Clear to auscultation, no crackles or wheezes.  Heart: Regular RR. S1 and S2 normal  Abdomen: BS normoactive. Soft, Nondistended, non-tender.  Extremities: Trace edema in the lower extremities bilaterally   Data Reviewed: Basic Metabolic Panel:  Recent Labs Lab 09/30/13 0117 09/30/13 0515 10/01/13 0431 10/02/13 0455 10/03/13 0530  NA 132* 133* 127* 137 140  K 4.5 4.3 5.2* 4.8 4.6  CL 100 102 95* 101 103  CO2 19 19 17* 20 25  GLUCOSE 212* 138* 399* 68* 100*  BUN 71* 70* 82* 84* 61*  CREATININE 2.60*  2.39* 2.20* 2.06* 1.60*  CALCIUM 8.1* 7.9* 8.5 8.6 8.6   Liver Function Tests:  Recent Labs Lab 09/29/13 0302 10/01/13 0431  AST 58* 164*  ALT 36 56*  ALKPHOS 166* 274*  BILITOT 1.6* 0.7  PROT 6.5 6.4  ALBUMIN 1.8* 1.7*   CBC:  Recent Labs Lab 09/29/13 0302 09/29/13 1050 09/30/13 0515 10/01/13 0431 10/02/13 0455 10/03/13 0530  WBC 13.7* 10.7* 5.0 9.5 7.2 9.8  NEUTROABS 11.2*  --   --   --   --   --   HGB 13.0 11.4* 11.8* 12.5* 12.8* 12.4*  HCT 38.4* 33.5* 37.0* 36.7* 37.0* 36.6*  MCV 96.5 94.9 94.9 95.8 93.4 95.3  PLT 129* 121* 114* 144* 159 204   Cardiac Enzymes:  Recent Labs Lab 09/29/13 0302 09/29/13 1000 09/29/13 1308 09/29/13 2056  CKTOTAL 706*  --   --   --   CKMB 4.5*  --   --   --   TROPONINI <0.30 <0.30 <0.30 <0.30   BNP (last 3 results)  Recent Labs  09/29/13 0302 10/01/13 0431  PROBNP 12088.0* 19037.0*   CBG:  Recent Labs Lab 10/02/13 1625 10/02/13 2206 10/03/13 0726 10/03/13 1208 10/03/13 1635  GLUCAP 184* 166* 95 174* 203*    Recent Results (from the past 240 hour(s))  CULTURE, BLOOD (ROUTINE X 2)     Status: None   Collection Time    09/29/13  2:50 AM      Result Value Range Status   Specimen Description BLOOD RIGHT ARM   Final   Special Requests BOTTLES DRAWN AEROBIC ONLY 9CC   Final   Culture  Setup Time     Final   Value: 09/29/2013 12:50     Performed at [facility]   Culture     Final   Value:        BLOOD CULTURE RECEIVED NO GROWTH TO DATE CULTURE WILL BE HELD FOR 5 DAYS BEFORE ISSUING A FINAL NEGATIVE REPORT     Performed at [facility]   Report Status PENDING   Incomplete  CULTURE, BLOOD (ROUTINE X 2)     Status: None   Collection Time    09/29/13  3:00 AM      Result Value Range Status   Specimen Description BLOOD LEFT ARM   Final   Special Requests BOTTLES DRAWN AEROBIC AND ANAEROBIC 10CC EACH   Final   Culture  Setup Time     Final   Value: 09/29/2013 12:50     Performed at [facility]   Culture     Final   Value:        BLOOD CULTURE RECEIVED NO GROWTH TO DATE CULTURE WILL BE HELD FOR 5 DAYS BEFORE ISSUING A FINAL NEGATIVE REPORT     Performed at [facility]   Report Status PENDING   Incomplete  URINE CULTURE     Status: None   Collection Time    09/29/13  3:49 AM      Result Value Range Status   Specimen Description URINE, CLEAN CATCH   Final   Special Requests NONE   Final  Culture  Setup Time     Final   Value: 09/29/2013 14:18     Performed at [facility] Count     Final   Value: 20,OOO COLONIES/ML     Performed at [facility]   Culture     Final   Value: Multiple bacterial morphotypes present, none predominant. Suggest appropriate recollection if clinically indicated.     Performed at [facility]   Report Status 09/30/2013 FINAL   Final  MRSA PCR SCREENING     Status: None   Collection Time    09/29/13  6:11 AM      Result Value Range Status   MRSA by PCR NEGATIVE  NEGATIVE Final   Comment:            The GeneXpert MRSA Assay (FDA     approved for NASAL specimens     only), is one component of a     comprehensive MRSA colonization     surveillance program. It is not     intended to diagnose MRSA     infection nor to guide or     monitor treatment for     MRSA infections.  CLOSTRIDIUM DIFFICILE BY PCR     Status: None   Collection Time    09/30/13  5:13 AM      Result Value Range Status   C difficile by pcr NEGATIVE  NEGATIVE Final  CULTURE, EXPECTORATED SPUTUM-ASSESSMENT     Status: None   Collection Time    09/30/13  9:21 AM      Result Value Range Status   Specimen Description SPUTUM   Final   Special Requests Normal   Final   Sputum evaluation     Final   Value: THIS SPECIMEN IS ACCEPTABLE. RESPIRATORY CULTURE REPORT TO FOLLOW.   Report Status 09/30/2013 FINAL   Final  CULTURE, RESPIRATORY (NON-EXPECTORATED)     Status: None   Collection Time    09/30/13  9:21 AM      Result Value  Range Status   Specimen Description SPUTUM   Final   Special Requests NONE   Final   Gram Stain     Final   Value: RARE WBC PRESENT, PREDOMINANTLY PMN     NO SQUAMOUS EPITHELIAL CELLS SEEN     NO ORGANISMS SEEN     Performed at [facility]   Culture     Final   Value: NORMAL OROPHARYNGEAL FLORA     Performed at [facility]   Report Status 10/02/2013 FINAL   Final     Studies:  Recent x-ray studies have been reviewed in detail by the Attending Physician  Scheduled Meds:  Scheduled Meds: . amiodarone  200 mg Oral Custom  . amiodarone  400 mg Oral Custom  . antiseptic oral rinse  15 mL Mouth Rinse BID  . aspirin EC  81 mg Oral Daily  . atorvastatin  20 mg Oral q1800  . azithromycin  500 mg Oral QHS  . carvedilol  3.125 mg Oral BID WC  . cefTRIAXone (ROCEPHIN)  IV  1 g Intravenous Q24H  . folic acid  1 mg Oral Daily  . furosemide  40 mg Oral Daily  . guaiFENesin  600 mg Oral BID  . insulin aspart  0-20 Units Subcutaneous TID WC  . insulin aspart  0-5 Units Subcutaneous QHS  . insulin aspart  10 Units Subcutaneous TID WC  . insulin glargine  40 Units Subcutaneous BID  . levothyroxine  150 mcg Oral QAC breakfast  . magic mouthwash w/lidocaine  5 mL Oral QID  . mexiletine  150 mg Oral Q8H  . thiamine  100 mg Oral Daily  . warfarin   Does not apply Once  . Warfarin - Pharmacist Dosing Inpatient   Does not apply q1800    Time spent on care of this patient: 35 mins   [facility]  [facility] Office  [phone] Pager - Text Page per Loretha Stapler as per below:   If 7PM-7AM, please contact night-coverage [email or web address] Password [email or web address] 10/03/2013, 6:52 PM   LOS: 4 days

## 2013-10-03 NOTE — Progress Notes (Signed)
SUBJECTIVE:  Breathing better today than yesterday.  He has sores on his lips   PHYSICAL EXAM Filed Vitals:   10/02/13 1552 10/02/13 2209 10/03/13 0545 10/03/13 1112  BP: 142/79 142/79 126/78   Pulse: 62 60 60   Temp: 97.5 F (36.4 C) 97.7 F (36.5 C) 97.9 F (36.6 C)   TempSrc: Oral Oral Oral   Resp:  18 18   Height:      Weight:   301 lb 9 oz (136.788 kg)   SpO2: 98% 94% 97% 94%   General:  No acute distress Lungs:   Clear Heart:  RRR Abdomen:  Positive bowel sounds, no rebound no guarding Extremities:  Mild edema  LABS:  Results for orders placed during the hospital encounter of 09/29/13 (from the past 24 hour(s))  GLUCOSE, CAPILLARY     Status: None   Collection Time    10/02/13 12:23 PM      Result Value Range   Glucose-Capillary 90  70 - 99 mg/dL  GLUCOSE, CAPILLARY     Status: Abnormal   Collection Time    10/02/13  4:25 PM      Result Value Range   Glucose-Capillary 184 (*) 70 - 99 mg/dL  GLUCOSE, CAPILLARY     Status: Abnormal   Collection Time    10/02/13 10:06 PM      Result Value Range   Glucose-Capillary 166 (*) 70 - 99 mg/dL   Comment 1 Notify RN    CBC     Status: Abnormal   Collection Time    10/03/13  5:30 AM      Result Value Range   WBC 9.8  4.0 - 10.5 K/uL   RBC 3.84 (*) 4.22 - 5.81 MIL/uL   Hemoglobin 12.4 (*) 13.0 - 17.0 g/dL   HCT 40.1 (*) 02.7 - 25.3 %   MCV 95.3  78.0 - 100.0 fL   MCH 32.3  26.0 - 34.0 pg   MCHC 33.9  30.0 - 36.0 g/dL   RDW 66.4  40.3 - 47.4 %   Platelets 204  150 - 400 K/uL  HEPARIN LEVEL (UNFRACTIONATED)     Status: None   Collection Time    10/03/13  5:30 AM      Result Value Range   Heparin Unfractionated 0.41  0.30 - 0.70 IU/mL  PROTIME-INR     Status: Abnormal   Collection Time    10/03/13  5:30 AM      Result Value Range   Prothrombin Time 22.8 (*) 11.6 - 15.2 seconds   INR 2.09 (*) 0.00 - 1.49  BASIC METABOLIC PANEL     Status: Abnormal   Collection Time    10/03/13  5:30 AM      Result Value  Range   Sodium 140  137 - 147 mEq/L   Potassium 4.6  3.7 - 5.3 mEq/L   Chloride 103  96 - 112 mEq/L   CO2 25  19 - 32 mEq/L   Glucose, Bld 100 (*) 70 - 99 mg/dL   BUN 61 (*) 6 - 23 mg/dL   Creatinine, Ser 2.59 (*) 0.50 - 1.35 mg/dL   Calcium 8.6  8.4 - 56.3 mg/dL   GFR calc non Af Amer 46 (*) >90 mL/min   GFR calc Af Amer 54 (*) >90 mL/min  GLUCOSE, CAPILLARY     Status: None   Collection Time    10/03/13  7:26 AM      Result Value Range  Glucose-Capillary 95  70 - 99 mg/dL   Comment 1 Notify RN      Intake/Output Summary (Last 24 hours) at 10/03/13 1158 Last data filed at 10/03/13 0800  Gross per 24 hour  Intake    330 ml  Output   2875 ml  Net  -2545 ml    ASSESSMENT AND PLAN:  Respiratory failure:  Multifactorial.    Good urine output yesterday and he tolerated the diuresis yesterday.  Continue low dose Lasix.   CKD:  Creat is slightly better again today.   VT HISTORY:  Continue amiodarone  APICAL THROMBUS:  Continue warfarin.     Fayrene Fearing [facility] 10/03/2013 11:58 AM

## 2013-10-03 NOTE — Progress Notes (Signed)
ANTICOAGULATION CONSULT NOTE - Follow Up Consult  Pharmacy Consult for Heparin, coumadin Indication: LV clot on TTE  Allergies  Allergen Reactions  . Ace Inhibitors     AVOID ALL  BECAUSE OF HEART DISEASE   . Nitroglycerin Other (See Comments)    Reaction unknown  . Procan Sr [Procainamide Hcl]     PT. HAS HEART DISEASE CALLED HYPERTHROPHIC CARDIOMYOPATHY  . Quinidine     PT. HAS HEART DISEASE CALLED HYPERTHROPHIC CARDIOMYOPATHY     Patient Measurements: Height: 6\' 1"  (185.4 cm) Weight: 301 lb 9 oz (136.788 kg) IBW/kg (Calculated) : 79.9 Heparin Dosing Weight: ~111 kg   Labs:  Recent Labs  10/01/13 0431  10/01/13 1820 10/02/13 0455 10/03/13 0530  HGB 12.5*  --   --  12.8* 12.4*  HCT 36.7*  --   --  37.0* 36.6*  PLT 144*  --   --  159 204  LABPROT  --   --  16.5* 17.3* 22.8*  INR  --   --  1.37 1.45 2.09*  HEPARINUNFRC 0.18*  < > 0.36 0.42 0.41  CREATININE 2.20*  --   --  2.06* 1.60*  < > = values in this interval not displayed.  Estimated Creatinine Clearance: 74 ml/min (by C-G formula based on Cr of 1.6).  Medications:  Heparin 2250 units/hr  Assessment: 57 y/o M on heparin for LV clot and coumadin added 09/01/13. Heparin level is 0.41 and INR is 2.09.  Large 5.5 second jump in protime after 2 doses of coumadin 7.5 mg.  First day of therapeutic INR.  CBC stable with no bleeding reported.  Patient noted on amiodarone and azithromycin.  Goal of Therapy:  Heparin level 0.3-0.7 units/ml Monitor platelets by anticoagulation protocol: Yes   Plan:  -continue heparin drip at 2250 units/hr -Coumadin 5 mg po x 1 dose today -Daily PT/INR -Daily CBC and heparin level -change IV azithromycin to PO Herby Abraham, Pharm.D. [phone] 10/03/2013 1:30 PM

## 2013-10-04 DIAGNOSIS — J9600 Acute respiratory failure, unspecified whether with hypoxia or hypercapnia: Secondary | ICD-10-CM

## 2013-10-04 LAB — GLUCOSE, CAPILLARY
Glucose-Capillary: 54 mg/dL — ABNORMAL LOW (ref 70–99)
Glucose-Capillary: 88 mg/dL (ref 70–99)
Glucose-Capillary: 114 mg/dL — ABNORMAL HIGH (ref 70–99)
Glucose-Capillary: 75 mg/dL (ref 70–99)

## 2013-10-04 LAB — CBC
HCT: 35.5 % — ABNORMAL LOW (ref 39.0–52.0)
Hemoglobin: 12 g/dL — ABNORMAL LOW (ref 13.0–17.0)
MCH: 32.3 pg (ref 26.0–34.0)
MCHC: 33.8 g/dL (ref 30.0–36.0)
MCV: 95.7 fL (ref 78.0–100.0)
Platelets: 230 10*3/uL (ref 150–400)
RBC: 3.71 MIL/uL — ABNORMAL LOW (ref 4.22–5.81)
RDW: 14.9 % (ref 11.5–15.5)
WBC: 12.1 10*3/uL — ABNORMAL HIGH (ref 4.0–10.5)

## 2013-10-04 LAB — HEPARIN LEVEL (UNFRACTIONATED): Heparin Unfractionated: 0.27 IU/mL — ABNORMAL LOW (ref 0.30–0.70)

## 2013-10-04 LAB — PROTIME-INR
INR: 2.99 — ABNORMAL HIGH (ref 0.00–1.49)
Prothrombin Time: 30 seconds — ABNORMAL HIGH (ref 11.6–15.2)

## 2013-10-04 LAB — CLOSTRIDIUM DIFFICILE BY PCR: CDIFFPCR: NEGATIVE

## 2013-10-04 MED ORDER — WARFARIN SODIUM 5 MG PO TABS: 5.0000 mg | ORAL_TABLET | Freq: Every day | ORAL | Status: DC

## 2013-10-04 MED ORDER — WARFARIN SODIUM 1 MG PO TABS
1.0000 mg | ORAL_TABLET | Freq: Once | ORAL | Status: DC
  Filled 2013-10-04: qty 1

## 2013-10-04 NOTE — Discharge Instructions (Signed)
Acute Coronary Syndrome °Acute coronary syndrome (ACS) is an urgent problem in which the blood and oxygen supply to the heart is critically deficient. ACS requires hospitalization because one or more coronary arteries may be blocked. °ACS represents a range of conditions including: °· Previous angina that is now unstable, lasts longer, happens at rest, or is more intense. °· A heart attack, with heart muscle cell injury and death. °There are three vital coronary arteries that supply the heart muscle with blood and oxygen so that it can pump blood effectively. If blockages to these arteries develop, blood flow to the heart muscle is reduced. If the heart does not get enough blood, angina may occur as the first warning sign. °SYMPTOMS  °· The most common signs of angina include: °· Tightness or squeezing in the chest. °· Feeling of heaviness on the chest. °· Discomfort in the arms, neck, or jaw. °· Shortness of breath and nausea. °· Cold, wet skin. °· Angina is usually brought on by physical effort or excitement which increase the oxygen needs of the heart. These states increase the blood flow needs of the heart beyond what can be delivered. °TREATMENT  °· Medicines to help discomfort may include nitroglycerin (nitro) in the form of tablets or a spray for rapid relief, or longer-acting forms such as cream, patches, or capsules. (Be aware that there are many side effects and possible interactions with other drugs). °· Other medicines may be used to help the heart pump better. °· Procedures to open blocked arteries including angioplasty or stent placement to keep the arteries open. °· Open heart surgery may be needed when there are many blockages or they are in critical locations that are best treated with surgery. °HOME CARE INSTRUCTIONS  °· Avoid smoking. °· Take one baby or adult aspirin daily, if your caregiver advises. This helps reduce the risk of a heart attack. °· It is very important that you follow the angina  treatment prescribed by your caregiver. Make arrangements for proper follow-up care. °· Eat a heart healthy diet with salt and fat restrictions as advised. °· Regular exercise is good for you as long as it does not cause discomfort. Do not begin any new type of exercise until you check with your caregiver. °· If you are overweight, you should lose weight. °· Try to maintain normal blood lipid levels. °· Keep your blood pressure under control as recommended by your caregiver. °· You should tell your caregiver right away about any increase in the severity or frequency of your chest discomfort or angina attacks. When you have angina, you should stop what you are doing and sit down. This may bring relief in 3 to 5 minutes. If your caregiver has prescribed nitro, take it as directed. °· If your caregiver has given you a follow-up appointment, it is very important to keep that appointment. Not keeping the appointment could result in a chronic or permanent injury, pain, and disability. If there is any problem keeping the appointment, you must call back to this facility for assistance. °SEEK IMMEDIATE MEDICAL CARE IF:  °· You develop nausea, vomiting, or shortness of breath. °· You feel faint, lightheaded, or pass out. °· Your chest discomfort gets worse. °· You are sweating or experience sudden profound fatigue. °· You do not get relief of your chest pain after 3 doses of nitro. °· Your discomfort lasts longer than 15 minutes. °MAKE SURE YOU:  °· Understand these instructions. °· Will watch your condition. °· Will get help   right away if you are not doing well or get worse. °Document Released: 09/20/2005 Document Revised: 12/13/2011 Document Reviewed: 04/23/2008 °[employer]® Patient Information ©2014 [employer]. ° °

## 2013-10-04 NOTE — Progress Notes (Signed)
SUBJECTIVE:  His biggest complaint is that he is having a sore mouth and diarrhea.  He did not respond to magic mouthwash.    PHYSICAL EXAM Filed Vitals:   10/03/13 1112 10/03/13 1327 10/03/13 1951 10/04/13 0545  BP:  131/64 118/62 122/74  Pulse:  63 61 62  Temp:  98.2 F (36.8 C) 98.1 F (36.7 C) 98.1 F (36.7 C)  TempSrc:  Oral Oral Oral  Resp:  18 18 16   Height:      Weight:    291 lb 10.7 oz (132.3 kg)  SpO2: 94% 98% 96% 94%   General:  No acute distress HEENT:  Ulcers on the lips but not mucosal ulcers or thrush Lungs:   Clear Heart:  RRR Abdomen:  Positive bowel sounds, no rebound no guarding Extremities:  Mild edema reduced  LABS:  Results for orders placed during the hospital encounter of 09/29/13 (from the past 24 hour(s))  GLUCOSE, CAPILLARY     Status: Abnormal   Collection Time    10/03/13 12:08 PM      Result Value Range   Glucose-Capillary 174 (*) 70 - 99 mg/dL   Comment 1 Notify RN    GLUCOSE, CAPILLARY     Status: Abnormal   Collection Time    10/03/13  4:35 PM      Result Value Range   Glucose-Capillary 203 (*) 70 - 99 mg/dL   Comment 1 Notify RN    GLUCOSE, CAPILLARY     Status: Abnormal   Collection Time    10/03/13  7:58 PM      Result Value Range   Glucose-Capillary 148 (*) 70 - 99 mg/dL   Comment 1 Notify RN    CBC     Status: Abnormal   Collection Time    10/04/13  5:32 AM      Result Value Range   WBC 12.1 (*) 4.0 - 10.5 K/uL   RBC 3.71 (*) 4.22 - 5.81 MIL/uL   Hemoglobin 12.0 (*) 13.0 - 17.0 g/dL   HCT 37.9 (*) 02.4 - 09.7 %   MCV 95.7  78.0 - 100.0 fL   MCH 32.3  26.0 - 34.0 pg   MCHC 33.8  30.0 - 36.0 g/dL   RDW 35.3  29.9 - 24.2 %   Platelets 230  150 - 400 K/uL  HEPARIN LEVEL (UNFRACTIONATED)     Status: Abnormal   Collection Time    10/04/13  5:32 AM      Result Value Range   Heparin Unfractionated 0.27 (*) 0.30 - 0.70 IU/mL  PROTIME-INR     Status: Abnormal   Collection Time    10/04/13  5:32 AM      Result Value  Range   Prothrombin Time 30.0 (*) 11.6 - 15.2 seconds   INR 2.99 (*) 0.00 - 1.49  GLUCOSE, CAPILLARY     Status: Abnormal   Collection Time    10/04/13  7:45 AM      Result Value Range   Glucose-Capillary 54 (*) 70 - 99 mg/dL   Comment 1 Notify RN     Comment 2 Documented in Chart      Intake/Output Summary (Last 24 hours) at 10/04/13 0805 Last data filed at 10/04/13 0600  Gross per 24 hour  Intake   1500 ml  Output   2600 ml  Net  -1100 ml    ASSESSMENT AND PLAN:  Respiratory failure:  Multifactorial.    Good urine output yesterday  and he tolerated the diuresis yesterday.  Continue low dose Lasix.  (Creat is not resulted yet.)  CKD:  Creat is pending today.   VT HISTORY:  Continue amiodarone  APICAL THROMBUS:  Continue warfarin. Discontinue heparin.     Fayrene FearingJames St [facility] LLCochrein 10/04/2013 8:05 AM

## 2013-10-04 NOTE — Discharge Summary (Signed)
Physician Discharge Summary  [name]. RUE:[id number] DOB: 09-Feb-1956 DOA: 09/29/2013  PCP: [name], MD  Admit date: 09/29/2013 Discharge date: 10/04/2013  Time spent: 50* minutes  Recommendations for Outpatient Follow-up:  1. Follow up cardiology in one week 2. Followup cardiology office on Monday for checking PT/INR, BMET  Discharge Diagnoses:  Active Problems:   Acute on chronic systolic CHF (congestive heart failure)   Sepsis   Acute renal failure   Cardiomyopathy   Diabetes mellitus   Metabolic acidosis   Hyperkalemia   Acute respiratory failure   Discharge Condition: Stable  Diet recommendation: Low salt diet  Filed Weights   10/02/13 0500 10/03/13 0545 10/04/13 0545  Weight: 137.1 kg (302 lb 4 oz) 136.788 kg (301 lb 9 oz) 132.3 kg (291 lb 10.7 oz)    History of present illness:  is a 58 y.o. male with DM, HTN, CAD - history of MI, ischemic cardiomyopathy, CKD cr range 1.4 to 1.5 who presents complaining of generalized weakness, dyspnea on exertion. He fell in the lobby of the hospital because of generalized weakness. He has been having diarrhea for last 3 days. He relates productive cough, Dyspnea, chest pain with cough for last 2 week.  He denies abdominal pain.  BP at this time in the 100 range. Oxygen saturation 96 on 3 L. He received 1 L IV fluids in the ED.      Hospital Course:  Sepsis  Patient was admitted with questionable sepsis, and started on broad-spectrum antibiotics Rocephin and Aztreonam, patient has improved with antibiotics for all the cultures are negative. Urine culture growing more for fights, blood cultures x2 have been negative so far. At this time antibiotics have been given for almost 6 days and will be discontinued  Diarrhea Patient came with diarrhea , C. difficile PCR was negative x2 At this time diarrhea has improved and patient is only having 1-2 stools per day Most likely viral  Newly appreciated LV apical clot   Patient had transthoracic echo which showed-EF 20-25% and  apical LV clot, patient was started on heparin and Coumadin. At this time heparin has been discontinued as the PT/INR is therapeutic today the INR is 2.99. Patient will be discharged on Coumadin 5 mg ER daily to be started from 10/05/13. Patient will followup with cardiology on Monday 10/08/13 to check PT/INR and also basic metabolic panel.   Acute respiratory failure due to:  A) HOCM w/ progressive Systolic CHF as well as grade 2 diastolic CHF  B) Suspected CAP  Patient-developed worsened hypoxia after hydration so IVFs held and was given 1 x dose of Lasix  -diuresed 5800 cc over past 24 hours  -CXR 12/29 looked c/w PNA process as well as edema and pt also coughing up thick tan sputum  Patient has completed 6 days of antibiotics he has been afebrile, blood cultures are negative so at this time will discontinue the antibiotics.   Mild rhabdo/myositis  -Due to above  -very gentle hydration stopped due to concerns of CHF - follow   Severe acute on mild chronic renal failure  Patient's-prior Cr range 1.4 to 1.5  His-crt was 3.8 at presentation  -Creatinine improved slightly w/ hydration and further improvement seen with gentle diuresis  At this time creatinine is back to almost baseline with 1.60 He'll be started back on the same dose of Lasix at home   Diabetes Mellitus-uncontrolled  Patient will continue his home regimen  Hyponatremia  -Due to hypovolemia and hyperglycemia  -  improved w/ volume resuscitation   Hyperkalemia  Resolved -Due to acute renal failure - stable   Metabolic acidosis / lactic acidosis  Resolved  Hypothyroidism  -cont Synthroid   Hx of CHB + Hx of ventricular tachycardia - s/p AICD (Dr. Ladona Ridgelaylor)  -cont Amiodarone   4 Consultants:  Cardiology  Procedures:  TTE - 12/28 - apical LV clot - mutliple WMA - EF 20-25%       Consultations:  Cardiology  Discharge Exam: Filed Vitals:   10/04/13  1220  BP: 116/64  Pulse: 62  Temp: 98 F (36.7 C)  Resp: 17    General: *Appears in no acute distress Cardiovascular: *S1-S2 regular Respiratory: *Clear bilaterally view of the sight  Discharge Instructions  Discharge Orders   Future Appointments Provider Department Dept Phone   10/08/2013 2:40 PM [facility] [facility] [facility] [phone]   10/29/2013 9:30 AM Ernestina Pennaonald W Moore, MD [facility] [phone]   12/13/2013 8:25 AM [facility] [phone]   Future Orders Complete By Expires   Diet - low sodium heart healthy  As directed    Discharge instructions  As directed    Comments:     Follow up cardiology in one week to check PT/INR   Increase activity slowly  As directed        Medication List    STOP taking these medications       aspirin 325 MG tablet      TAKE these medications       amiodarone 400 MG tablet  Commonly known as:  PACERONE  Take 200-400 mg by mouth daily. Take 200mg  on Sat and Sun take 400mg  all other days     carvedilol 25 MG tablet  Commonly known as:  COREG  TAKE 1 TABLET TWICE A DAY     cholecalciferol 1000 UNITS tablet  Commonly known as:  VITAMIN D  Take 2,000 Units by mouth daily.     colesevelam 625 MG tablet  Commonly known as:  WELCHOL  Take 625 mg by mouth 3 (three) times daily.     fish oil-omega-3 fatty acids 1000 MG capsule  Take 3 capsules by mouth daily.     furosemide 40 MG tablet  Commonly known as:  LASIX  Take 1 tablet (40 mg total) by mouth daily.     glimepiride 4 MG tablet  Commonly known as:  AMARYL  Take 1 tablet (4 mg total) by mouth 2 (two) times daily.     insulin glargine 100 UNIT/ML injection  Commonly known as:  LANTUS  Inject 0.5 mLs (50 Units total) into the skin 2 (two) times daily.     insulin lispro 100 UNIT/ML KiwkPen  Commonly known as:  HUMALOG KWIKPEN  - Inject 20 units with largest/evening  meal  - Dispense quikpens     levothyroxine 150 MCG tablet  Commonly known as:  SYNTHROID, LEVOTHROID  Take 1 tablet (150 mcg total) by mouth daily.     lisinopril 2.5 MG tablet  Commonly known as:  PRINIVIL,ZESTRIL  Take 1 tablet (2.5 mg total) by mouth 2 (two) times daily.     mexiletine 150 MG capsule  Commonly known as:  MEXITIL  Take 1 capsule (150 mg total) by mouth every 8 (eight) hours.     rosuvastatin 20 MG tablet  Commonly known as:  CRESTOR  Take 0.5 tablets (10 mg total) by mouth daily.     sitaGLIPtin  50 MG tablet  Commonly known as:  JANUVIA  Take 1 tablet (50 mg total) by mouth daily.     warfarin 5 MG tablet  Commonly known as:  COUMADIN  Take 1 tablet (5 mg total) by mouth daily.       Allergies  Allergen Reactions  . Ace Inhibitors     AVOID ALL  BECAUSE OF HEART DISEASE   . Nitroglycerin Other (See Comments)    Reaction unknown  . Procan Holloway [Procainamide Hcl]     PT. HAS HEART DISEASE CALLED HYPERTHROPHIC CARDIOMYOPATHY  . Quinidine     PT. HAS HEART DISEASE CALLED HYPERTHROPHIC CARDIOMYOPATHY        The results of significant diagnostics from this hospitalization (including imaging, microbiology, ancillary and laboratory) are listed below for reference.    Significant Diagnostic Studies: Dg Chest 2 View  09/29/2013   CLINICAL DATA:  Cough and back pain.  History of smoking.  EXAM: CHEST  2 VIEW  COMPARISON:  Chest radiograph performed 07/30/2011  FINDINGS: The lungs are well-aerated. Right mid lung and right basilar airspace opacities raise concern for mild pneumonia. Pulmonary vascularity is at the upper limits of normal. There is no evidence of pleural effusion or pneumothorax.  The heart is mildly enlarged. A pacemaker/AICD is noted at the left chest wall, with leads ending at the right atrium, right ventricle and coronary sinus. No acute osseous abnormalities are seen.  IMPRESSION: 1. Mild multifocal right-sided pneumonia noted. 2. Mild  cardiomegaly.   Electronically Signed   By: Roanna Raider M.D.   On: 09/29/2013 04:04   Dg Lumbar Spine Complete  09/29/2013   CLINICAL DATA:  Lower back pain.  EXAM: LUMBAR SPINE - COMPLETE 4+ VIEW  COMPARISON:  None.  FINDINGS: There is no evidence of fracture or subluxation. Vertebral bodies demonstrate normal height. There is mild apparent chronic grade 1 anterolisthesis of L4 on L5, reflecting facet disease. Mild disc space narrowing is noted at L4-L5. There is partial sacralization of vertebral body L5.  The visualized bowel gas pattern is unremarkable in appearance; air and stool are noted within the colon. The sacroiliac joints are within normal limits. Diffuse vascular calcifications are seen.  IMPRESSION: 1. No evidence of fracture or subluxation along the lumbar spine. 2. Mild degenerative change noted at the lower lumbar spine. 3. Diffuse vascular calcifications seen.   Electronically Signed   By: Roanna Raider M.D.   On: 09/29/2013 04:06   Dg Chest Port 1 View  10/01/2013   CLINICAL DATA:  Low oxygen saturation.  Shortness of breath.  EXAM: PORTABLE CHEST - 1 VIEW  COMPARISON:  09/29/2013  FINDINGS: Shallow inspiration. Cardiac enlargement with some prominence of pulmonary vascularity. Increasing infiltration or consolidation in the right mid and lower lung zones suggesting progression of pneumonia. Possible small pleural effusions bilaterally. No pneumothorax. Stable appearance of cardiac pacemaker.  IMPRESSION: Increasing infiltration in the right lung suggesting progression of pneumonia.   Electronically Signed   By: Burman Nieves M.D.   On: 10/01/2013 06:04    Microbiology: Recent Results (from the past 240 hour(s))  CULTURE, BLOOD (ROUTINE X 2)     Status: None   Collection Time    09/29/13  2:50 AM      Result Value Range Status   Specimen Description BLOOD RIGHT ARM   Final   Special Requests BOTTLES DRAWN AEROBIC ONLY 9CC   Final   Culture  Setup Time     Final   Value:  09/29/2013 12:50     Performed at [facility]   Culture     Final   Value:        BLOOD CULTURE RECEIVED NO GROWTH TO DATE CULTURE WILL BE HELD FOR 5 DAYS BEFORE ISSUING A FINAL NEGATIVE REPORT     Performed at [facility]   Report Status PENDING   Incomplete  CULTURE, BLOOD (ROUTINE X 2)     Status: None   Collection Time    09/29/13  3:00 AM      Result Value Range Status   Specimen Description BLOOD LEFT ARM   Final   Special Requests BOTTLES DRAWN AEROBIC AND ANAEROBIC 10CC EACH   Final   Culture  Setup Time     Final   Value: 09/29/2013 12:50     Performed at [facility]   Culture     Final   Value:        BLOOD CULTURE RECEIVED NO GROWTH TO DATE CULTURE WILL BE HELD FOR 5 DAYS BEFORE ISSUING A FINAL NEGATIVE REPORT     Performed at [facility]   Report Status PENDING   Incomplete  URINE CULTURE     Status: None   Collection Time    09/29/13  3:49 AM      Result Value Range Status   Specimen Description URINE, CLEAN CATCH   Final   Special Requests NONE   Final   Culture  Setup Time     Final   Value: 09/29/2013 14:18     Performed at [facility] Count     Final   Value: 20,OOO COLONIES/ML     Performed at [facility]   Culture     Final   Value: Multiple bacterial morphotypes present, none predominant. Suggest appropriate recollection if clinically indicated.     Performed at [facility]   Report Status 09/30/2013 FINAL   Final  MRSA PCR SCREENING     Status: None   Collection Time    09/29/13  6:11 AM      Result Value Range Status   MRSA by PCR NEGATIVE  NEGATIVE Final   Comment:            The GeneXpert MRSA Assay (FDA     approved for NASAL specimens     only), is one component of a     comprehensive MRSA colonization     surveillance program. It is not     intended to diagnose MRSA     infection nor to guide or     monitor treatment for     MRSA infections.  CLOSTRIDIUM DIFFICILE BY  PCR     Status: None   Collection Time    09/30/13  5:13 AM      Result Value Range Status   C difficile by pcr NEGATIVE  NEGATIVE Final  CULTURE, EXPECTORATED SPUTUM-ASSESSMENT     Status: None   Collection Time    09/30/13  9:21 AM      Result Value Range Status   Specimen Description SPUTUM   Final   Special Requests Normal   Final   Sputum evaluation     Final   Value: THIS SPECIMEN IS ACCEPTABLE. RESPIRATORY CULTURE REPORT TO FOLLOW.   Report Status 09/30/2013 FINAL   Final  CULTURE, RESPIRATORY (NON-EXPECTORATED)     Status: None   Collection Time    09/30/13  9:21 AM  Result Value Range Status   Specimen Description SPUTUM   Final   Special Requests NONE   Final   Gram Stain     Final   Value: RARE WBC PRESENT, PREDOMINANTLY PMN     NO SQUAMOUS EPITHELIAL CELLS SEEN     NO ORGANISMS SEEN     Performed at [facility]   Culture     Final   Value: NORMAL OROPHARYNGEAL FLORA     Performed at [facility]   Report Status 10/02/2013 FINAL   Final  CLOSTRIDIUM DIFFICILE BY PCR     Status: None   Collection Time    10/04/13  9:08 AM      Result Value Range Status   C difficile by pcr NEGATIVE  NEGATIVE Final     Labs: Basic Metabolic Panel:  Recent Labs Lab 09/30/13 0117 09/30/13 0515 10/01/13 0431 10/02/13 0455 10/03/13 0530  NA 132* 133* 127* 137 140  K 4.5 4.3 5.2* 4.8 4.6  CL 100 102 95* 101 103  CO2 19 19 17* 20 25  GLUCOSE 212* 138* 399* 68* 100*  BUN 71* 70* 82* 84* 61*  CREATININE 2.60* 2.39* 2.20* 2.06* 1.60*  CALCIUM 8.1* 7.9* 8.5 8.6 8.6   Liver Function Tests:  Recent Labs Lab 09/29/13 0302 10/01/13 0431  AST 58* 164*  ALT 36 56*  ALKPHOS 166* 274*  BILITOT 1.6* 0.7  PROT 6.5 6.4  ALBUMIN 1.8* 1.7*   No results found for this basename: LIPASE, AMYLASE,  in the last 168 hours No results found for this basename: AMMONIA,  in the last 168 hours CBC:  Recent Labs Lab 09/29/13 0302  09/30/13 0515 10/01/13 0431  10/02/13 0455 10/03/13 0530 10/04/13 0532  WBC 13.7*  < > 5.0 9.5 7.2 9.8 12.1*  NEUTROABS 11.2*  --   --   --   --   --   --   HGB 13.0  < > 11.8* 12.5* 12.8* 12.4* 12.0*  HCT 38.4*  < > 37.0* 36.7* 37.0* 36.6* 35.5*  MCV 96.5  < > 94.9 95.8 93.4 95.3 95.7  PLT 129*  < > 114* 144* 159 204 230  < > = values in this interval not displayed. Cardiac Enzymes:  Recent Labs Lab 09/29/13 0302 09/29/13 1000 09/29/13 1308 09/29/13 2056  CKTOTAL 706*  --   --   --   CKMB 4.5*  --   --   --   TROPONINI <0.30 <0.30 <0.30 <0.30   BNP: BNP (last 3 results)  Recent Labs  09/29/13 0302 10/01/13 0431  PROBNP 12088.0* 19037.0*   CBG:  Recent Labs Lab 10/03/13 1635 10/03/13 1958 10/04/13 0745 10/04/13 0836 10/04/13 1139  GLUCAP 203* 148* 54* 88 114*       Signed:  Teagen Mcleary S  [facility] 10/04/2013, 3:05 PM

## 2013-10-04 NOTE — Progress Notes (Signed)
DC orders received.  Patient stable with no S/S of distress.  Medication and discharge information reviewed with patient and patient's wife.  Patient DC home with wife. [name]  

## 2013-10-04 NOTE — Progress Notes (Signed)
ANTICOAGULATION CONSULT NOTE - Follow Up Consult  Pharmacy Consult for coumadin Indication: LV clot on TTE  Allergies  Allergen Reactions  . Ace Inhibitors     AVOID ALL  BECAUSE OF HEART DISEASE   . Nitroglycerin Other (See Comments)    Reaction unknown  . Procan Sr [Procainamide Hcl]     PT. HAS HEART DISEASE CALLED HYPERTHROPHIC CARDIOMYOPATHY  . Quinidine     PT. HAS HEART DISEASE CALLED HYPERTHROPHIC CARDIOMYOPATHY     Patient Measurements: Height: 6\' 1"  (185.4 cm) Weight: 291 lb 10.7 oz (132.3 kg) IBW/kg (Calculated) : 79.9 Heparin Dosing Weight: ~111 kg   Labs:  Recent Labs  10/02/13 0455 10/03/13 0530 10/04/13 0532  HGB 12.8* 12.4* 12.0*  HCT 37.0* 36.6* 35.5*  PLT 159 204 230  LABPROT 17.3* 22.8* 30.0*  INR 1.45 2.09* 2.99*  HEPARINUNFRC 0.42 0.41 0.27*  CREATININE 2.06* 1.60*  --     Estimated Creatinine Clearance: 72.7 ml/min (by C-G formula based on Cr of 1.6).  Medications:  Heparin 2250 units/hr  Assessment: 58 y/o M on heparin for LV clot and coumadin added 09/01/13. Heparin level is 0.27 and INR is 2.99.  Large 7.2 second jump in protime today and large 5.5 second jump in protime yesterday.  Today will be day #4 of coumadin.  2nd day of therapeutic INR.  CBC stable with no bleeding reported.  Patient noted on amiodarone and azithromycin.  Goal of Therapy:  INR 2-3   Plan:  - heparin drip stopped by cardiology - coumadin 1 mg po x 1 dose today  -Daily PT/INR  Herby Abraham, Pharm.D. [phone] 10/04/2013 9:34 AM

## 2013-10-05 LAB — CULTURE, BLOOD (ROUTINE X 2)
Culture: NO GROWTH
Culture: NO GROWTH

## 2013-10-08 ENCOUNTER — Ambulatory Visit (INDEPENDENT_AMBULATORY_CARE_PROVIDER_SITE_OTHER): Payer: BC Managed Care – PPO | Admitting: *Deleted

## 2013-10-08 ENCOUNTER — Other Ambulatory Visit: Payer: Self-pay | Admitting: Cardiology

## 2013-10-08 DIAGNOSIS — I8290 Acute embolism and thrombosis of unspecified vein: Secondary | ICD-10-CM

## 2013-10-08 DIAGNOSIS — I513 Intracardiac thrombosis, not elsewhere classified: Secondary | ICD-10-CM | POA: Insufficient documentation

## 2013-10-08 DIAGNOSIS — I749 Embolism and thrombosis of unspecified artery: Secondary | ICD-10-CM

## 2013-10-08 LAB — BASIC METABOLIC PANEL
BUN: 24 mg/dL — AB (ref 6–23)
CHLORIDE: 101 meq/L (ref 96–112)
CO2: 25 meq/L (ref 19–32)
CREATININE: 1.8 mg/dL — AB (ref 0.4–1.5)
Calcium: 8.6 mg/dL (ref 8.4–10.5)
GFR: 42.52 mL/min — ABNORMAL LOW (ref >60.00)
Glucose, Bld: 371 mg/dL — ABNORMAL HIGH (ref 70–99)
Potassium: 5 mEq/L (ref 3.5–5.1)
Sodium: 131 mEq/L — ABNORMAL LOW (ref 135–145)

## 2013-10-08 LAB — POCT INR: INR: 4

## 2013-10-11 ENCOUNTER — Other Ambulatory Visit: Payer: Self-pay

## 2013-10-11 ENCOUNTER — Telehealth: Payer: Self-pay | Admitting: Pharmacist

## 2013-10-11 DIAGNOSIS — E871 Hypo-osmolality and hyponatremia: Secondary | ICD-10-CM

## 2013-10-11 NOTE — Telephone Encounter (Signed)
Called patient to follow how he is doing following hospitalization.  Patient was started on warfarin and the last INR was reviewed by [facility].  He is schedule to follow up with provider there and have protime rechecked 10/16/13.  After if needed we can take over anticoagulation monitoring.  Patient states BG is slowly improving again.  He is still a little weak but otherwise reports feeling better.

## 2013-10-16 ENCOUNTER — Ambulatory Visit (INDEPENDENT_AMBULATORY_CARE_PROVIDER_SITE_OTHER): Payer: BC Managed Care – PPO

## 2013-10-16 ENCOUNTER — Encounter: Payer: Self-pay | Admitting: Cardiology

## 2013-10-16 ENCOUNTER — Ambulatory Visit (INDEPENDENT_AMBULATORY_CARE_PROVIDER_SITE_OTHER): Payer: BC Managed Care – PPO | Admitting: Cardiology

## 2013-10-16 ENCOUNTER — Other Ambulatory Visit (INDEPENDENT_AMBULATORY_CARE_PROVIDER_SITE_OTHER): Payer: BC Managed Care – PPO

## 2013-10-16 VITALS — BP 118/58 | HR 56 | Ht 73.0 in | Wt 289.0 lb

## 2013-10-16 DIAGNOSIS — I4729 Other ventricular tachycardia: Secondary | ICD-10-CM

## 2013-10-16 DIAGNOSIS — I749 Embolism and thrombosis of unspecified artery: Secondary | ICD-10-CM

## 2013-10-16 DIAGNOSIS — I472 Ventricular tachycardia: Secondary | ICD-10-CM

## 2013-10-16 DIAGNOSIS — I428 Other cardiomyopathies: Secondary | ICD-10-CM

## 2013-10-16 DIAGNOSIS — Z95810 Presence of automatic (implantable) cardiac defibrillator: Secondary | ICD-10-CM

## 2013-10-16 DIAGNOSIS — I8290 Acute embolism and thrombosis of unspecified vein: Secondary | ICD-10-CM

## 2013-10-16 DIAGNOSIS — I442 Atrioventricular block, complete: Secondary | ICD-10-CM

## 2013-10-16 DIAGNOSIS — I5022 Chronic systolic (congestive) heart failure: Secondary | ICD-10-CM

## 2013-10-16 DIAGNOSIS — E871 Hypo-osmolality and hyponatremia: Secondary | ICD-10-CM

## 2013-10-16 LAB — POCT INR: INR: 2.4

## 2013-10-16 LAB — BASIC METABOLIC PANEL
BUN: 22 mg/dL (ref 6–23)
CALCIUM: 8.8 mg/dL (ref 8.4–10.5)
CO2: 24 mEq/L (ref 19–32)
Chloride: 105 mEq/L (ref 96–112)
Creatinine, Ser: 1.6 mg/dL — ABNORMAL HIGH (ref 0.4–1.5)
GFR: 49.23 mL/min — ABNORMAL LOW (ref >60.00)
GLUCOSE: 131 mg/dL — AB (ref 70–99)
Potassium: 5 mEq/L (ref 3.5–5.1)
Sodium: 137 mEq/L (ref 135–145)

## 2013-10-16 NOTE — Progress Notes (Signed)
Patient ID: Dakota Brook Sr. MRN: [id number], DOB/AGE: 1956-02-26   Date of Visit: 10/16/2013  Primary Physician: [name], MD Primary Cardiologist: Antoine Poche, MD Primary EP: [name], MD Reason for Visit: Hospital follow-up  History of Present Illness  Dakota STITELY Sr. is a 58 y.o. male with a NICM, HCM, chronic systolic CHF s/p CRT-D (EF 25% in 2012, mod nonobst CAD), CHB and VT s/p ablation (on amio/mex) who presented to [facility] 09/29/2013 with generalized weakness and dyspnea, found to have sepsis syndrome, pyelonephritis, acute renal failure and PNA. He was hydrated and developed acute on chronic systolic HF which improved with IV diuresis. An echo was done revealing LV thrombus and he was started on warfarin. He was discharged 10/04/2013. Of note, his blood cultures showed no growth at 5 days.   He presents today for hospital followup. Since discharge, he reports persistent weakness. He has not fully regained his strength. He denies any other complaints. He denies chest pain. He has chronic DOE but states this is not worsening from baseline and is improved since his hospitalization. He denies palpitations, dizziness, near syncope or syncope. He denies LE swelling, orthopnea, PND or recent weight gain. He reports compliance with medications.   Past Medical History Past Medical History  Diagnosis Date  . DM   . HYPERLIPIDEMIA-MIXED   . Obesity, unspecified   . HYPERTENSION, UNSPECIFIED   . CAD (coronary artery disease)     a. Nonobstructive moderate CAD by cath - last 2012.  [name] Hypertrophic obstructive cardiomyopathy   . Complete heart block     a. s/p pacemaker later upgraded to CRT-D.  [name] CKD (chronic kidney disease)     a. Baseline Cr 1.4-1.5.  [name] Chronic systolic CHF (congestive heart failure)     a. NICM (EF 25% in 2012, 20-25% in 09/2013). b. s/p BiV-ICD (MDT).  . Colon polyps   . Ventricular tachycardia     a. EPS/RFCA of VT in 2012. b. On amio,  mexilitene. c. Last BiV-ICD gen change 05/2013 (MDT).  . Hypothyroidism   . Fatty liver disease, nonalcoholic   . BPH (benign prostatic hyperplasia)   . Habitual alcohol use     Past Surgical History Past Surgical History  Procedure Laterality Date  . Pacemaker insertion      medtronic  . Cardiac catheterization    . Facial reconstruction surgery      after trauma in 2006    Allergies/Intolerances Allergies  Allergen Reactions  . Ace Inhibitors     AVOID ALL  BECAUSE OF HEART DISEASE   . Nitroglycerin Other (See Comments)    Reaction unknown  . Procan Sr [Procainamide Hcl]     PT. HAS HEART DISEASE CALLED HYPERTHROPHIC CARDIOMYOPATHY  . Quinidine     PT. HAS HEART DISEASE CALLED HYPERTHROPHIC CARDIOMYOPATHY      Current Home Medications Current Outpatient Prescriptions  Medication Sig Dispense Refill  . amiodarone (PACERONE) 400 MG tablet Take 200-400 mg by mouth daily. Take 200mg  on Sat and Sun take 400mg  all other days      . carvedilol (COREG) 25 MG tablet TAKE 1 TABLET TWICE A DAY  180 tablet  0  . cholecalciferol (VITAMIN D) 1000 UNITS tablet Take 2,000 Units by mouth daily.       . colesevelam (WELCHOL) 625 MG tablet Take 625 mg by mouth 3 (three) times daily.       . fish oil-omega-3 fatty acids 1000 MG capsule Take 3 capsules by mouth  daily.       . furosemide (LASIX) 40 MG tablet Take 1 tablet (40 mg total) by mouth daily.  90 tablet  1  . glimepiride (AMARYL) 4 MG tablet Take 1 tablet (4 mg total) by mouth 2 (two) times daily.  180 tablet  3  . insulin glargine (LANTUS) 100 UNIT/ML injection Inject 0.5 mLs (50 Units total) into the skin 2 (two) times daily.  90 mL  1  . insulin lispro (HUMALOG KWIKPEN) 100 UNIT/ML SOPN Inject 20 units with largest/evening meal Dispense quikpens  30 mL  1  . levothyroxine (SYNTHROID, LEVOTHROID) 150 MCG tablet Take 1 tablet (150 mcg total) by mouth daily.  90 tablet  3  . lisinopril (PRINIVIL,ZESTRIL) 2.5 MG tablet Take 1 tablet  (2.5 mg total) by mouth 2 (two) times daily.  180 tablet  3  . mexiletine (MEXITIL) 150 MG capsule Take 1 capsule (150 mg total) by mouth every 8 (eight) hours.  270 capsule  1  . rosuvastatin (CRESTOR) 20 MG tablet Take 0.5 tablets (10 mg total) by mouth daily.  21 tablet  0  . sitaGLIPtin (JANUVIA) 50 MG tablet Take 1 tablet (50 mg total) by mouth daily.  30 tablet  2  . warfarin (COUMADIN) 5 MG tablet Take 1 tablet (5 mg total) by mouth daily.  30 tablet  2   No current facility-administered medications for this visit.    Social History History   Social History  . Marital Status: Married    Spouse Name: N/A    Number of Children: N/A  . Years of Education: N/A   Occupational History  . Not on file.   Social History Main Topics  . Smoking status: Former Smoker    Types: Cigarettes    Quit date: 10/04/1968  . Smokeless tobacco: Never Used     Comment: 30+ yrs  . Alcohol Use: Yes     Comment: about 1 cup of liquor daily (straight liquor - not mixed)  . Drug Use: No  . Sexual Activity: Not on file   Other Topics Concern  . Not on file   Social History Narrative  . No narrative on file     Review of Systems General: No chills, fever, night sweats or weight changes Cardiovascular: No chest pain, dyspnea on exertion, edema, orthopnea, palpitations, paroxysmal nocturnal dyspnea Dermatological: No rash, lesions or masses Respiratory: No cough, dyspnea Urologic: No hematuria, dysuria Abdominal: No nausea, vomiting, diarrhea, bright red blood per rectum, melena, or hematemesis Neurologic: No visual changes, weakness, changes in mental status All other systems reviewed and are otherwise negative except as noted above.  Physical Exam Vitals: Blood pressure 118/58, pulse 56, height 6\' 1"  (1.854 m), weight 289 lb (131.09 kg).  General: Well developed 58 y.o. male in no acute distress. HEENT: Normocephalic, atraumatic. EOMs intact. Sclera nonicteric. Oropharynx clear.  Neck:  Supple. No JVD. Lungs: Respirations regular and unlabored, CTA bilaterally. No wheezes, rales or rhonchi. Heart: RRR. S1, S2 present. No murmurs, rub, S3 or S4. Abdomen: Soft, non-tender, non-distended. BS present x 4 quadrants. No hepatosplenomegaly.  Extremities: No clubbing, cyanosis or edema. PT/Radials 2+ and equal bilaterally. Psych: Normal affect. Neuro: Alert and oriented X 3. Moves all extremities spontaneously.   Diagnostics  2D Echo 09/30/13  Study Conclusions  - Procedure narrative: Transthoracic echocardiography. Image quality was suboptimal, with poor sound transmission. - Left ventricle: A thrombus is seen in the region of the apex and apical septal wall, measuring 2.0 x  1.5 cm. The cavity size was mildly dilated. Wall thickness was increased in a pattern of mild LVH. Systolic function was severely reduced. The estimated ejection fraction was in the range of 20% to 25%. Features are consistent with a pseudonormal left ventricular filling pattern, with concomitant abnormal relaxation and increased filling pressure (grade 2 diastolic dysfunction). Doppler parameters are consistent with high ventricular filling pressure. - Regional wall motion abnormality: Akinesis of the apical septal and apical myocardium; severe hypokinesis of the apical lateral myocardium; moderate hypokinesis of the mid inferoseptal and mid anterolateral myocardium. - Mitral valve: Calcified annulus. Mildly thickened leaflets . Mild regurgitation. - Left atrium: The atrium was moderately dilated. - Right ventricle: Poorly visualized. Unable to assess systolic function. The cavity size was mildly dilated. Pacer wire or catheter noted in right ventricle.  - Right atrium: The atrium was mildly dilated. - Tricuspid valve: Inadequate spectral Doppler profile to accurately assess pulmonary pressures.  Impression: - A thrombus is seen in the region of the apex and apical septal wall, measuring 2.0 x 1.5 cm. Systolic  function is severely reduced, EF 20-25%.   Device interrogation today - normal BiV ICD function; stable lead impedances, sensing and thresholds (tested with surface ECG in place and confirmed with Dr. Ladona Ridgelaylor); pacemaker dependent; 0 VT/VF episodes; 0 AT/AF episodes; BiV paced 99.7% of time; no programming changes made; see PaceArt report for full details  Assessment and Plan 1. NICM and complete heart block s/p CRT-D implant - normal device function - no programming changes made - continue routine remote device follow-up every 3 months - return for follow-up with Dr. Ladona Ridgelaylor in 6 months - he is also due for follow-up with Dr. Antoine PocheHochrein so will arrange within next 4 weeks 2. LV thrombus - continue warfarin 3. Paroxysmal VT - stable; no VT/VF episodes by interrogation today - continue amiodarone and mexiletine as previously directed by Dr. Ladona Ridgelaylor 4. Chronic systolic HF - stable; improved since hospitalization - OptiVol reviewed - check BMET today  - continue medical therapy  Signed, Rick DuffDMISTEN, Devanny Palecek, PA-C 10/16/2013, 10:24 AM

## 2013-10-16 NOTE — Patient Instructions (Signed)
Your physician wants you to follow-up in: with Dr. [name] 6 months  You will receive a reminder letter in the mail two months in advance. If you don't receive a letter, please call our office to schedule the follow-up appointment.  Your physician recommends that you continue on your current medications as directed. Please refer to the Current Medication list given to you today.

## 2013-10-18 ENCOUNTER — Telehealth: Payer: Self-pay | Admitting: Family Medicine

## 2013-10-18 NOTE — Telephone Encounter (Signed)
Patient aware to pick up 

## 2013-10-23 ENCOUNTER — Encounter: Payer: Self-pay | Admitting: Internal Medicine

## 2013-10-23 ENCOUNTER — Ambulatory Visit (INDEPENDENT_AMBULATORY_CARE_PROVIDER_SITE_OTHER): Payer: BC Managed Care – PPO | Admitting: *Deleted

## 2013-10-23 DIAGNOSIS — I749 Embolism and thrombosis of unspecified artery: Secondary | ICD-10-CM

## 2013-10-23 DIAGNOSIS — I8290 Acute embolism and thrombosis of unspecified vein: Secondary | ICD-10-CM

## 2013-10-23 LAB — MDC_IDC_ENUM_SESS_TYPE_INCLINIC
Brady Statistic AP VP Percent: 99.51 %
Brady Statistic AP VS Percent: 0.08 %
Brady Statistic AS VP Percent: 0.41 %
Brady Statistic AS VS Percent: 0 %
Brady Statistic RA Percent Paced: 99.58 %
Date Time Interrogation Session: 20150113164537
HIGH POWER IMPEDANCE MEASURED VALUE: 228 Ohm
HIGH POWER IMPEDANCE MEASURED VALUE: 61 Ohm
HighPow Impedance: 46 Ohm
Lead Channel Impedance Value: 399 Ohm
Lead Channel Impedance Value: 437 Ohm
Lead Channel Impedance Value: 456 Ohm
Lead Channel Pacing Threshold Amplitude: 0.875 V
Lead Channel Pacing Threshold Amplitude: 0.875 V
Lead Channel Pacing Threshold Amplitude: 1 V
Lead Channel Pacing Threshold Pulse Width: 0.4 ms
Lead Channel Pacing Threshold Pulse Width: 0.4 ms
Lead Channel Pacing Threshold Pulse Width: 0.4 ms
Lead Channel Pacing Threshold Pulse Width: 0.4 ms
Lead Channel Pacing Threshold Pulse Width: 0.8 ms
Lead Channel Sensing Intrinsic Amplitude: 1.625 mV
Lead Channel Sensing Intrinsic Amplitude: 2.25 mV
Lead Channel Setting Pacing Amplitude: 2.25 V
Lead Channel Setting Pacing Pulse Width: 0.4 ms
MDC IDC MSMT BATTERY REMAINING LONGEVITY: 75 mo
MDC IDC MSMT BATTERY VOLTAGE: 3.01 V
MDC IDC MSMT LEADCHNL LV IMPEDANCE VALUE: 228 Ohm
MDC IDC MSMT LEADCHNL LV IMPEDANCE VALUE: 513 Ohm
MDC IDC MSMT LEADCHNL LV PACING THRESHOLD PULSEWIDTH: 0.8 ms
MDC IDC MSMT LEADCHNL RA PACING THRESHOLD AMPLITUDE: 0.625 V
MDC IDC MSMT LEADCHNL RA PACING THRESHOLD AMPLITUDE: 0.75 V
MDC IDC MSMT LEADCHNL RV PACING THRESHOLD AMPLITUDE: 0.75 V
MDC IDC SET LEADCHNL LV PACING PULSEWIDTH: 0.8 ms
MDC IDC SET LEADCHNL RA PACING AMPLITUDE: 2 V
MDC IDC SET LEADCHNL RV PACING AMPLITUDE: 2.5 V
MDC IDC SET LEADCHNL RV SENSING SENSITIVITY: 0.3 mV
MDC IDC SET ZONE DETECTION INTERVAL: 350 ms
MDC IDC SET ZONE DETECTION INTERVAL: 510 ms
MDC IDC STAT BRADY RV PERCENT PACED: 99.69 %
Zone Setting Detection Interval: 280 ms
Zone Setting Detection Interval: 350 ms
Zone Setting Detection Interval: 500 ms

## 2013-10-23 LAB — POCT INR: INR: 1.8

## 2013-10-28 ENCOUNTER — Other Ambulatory Visit: Payer: Self-pay | Admitting: Family Medicine

## 2013-10-29 ENCOUNTER — Encounter: Payer: Self-pay | Admitting: Family Medicine

## 2013-10-29 ENCOUNTER — Ambulatory Visit (INDEPENDENT_AMBULATORY_CARE_PROVIDER_SITE_OTHER): Payer: BC Managed Care – PPO | Admitting: Pharmacist

## 2013-10-29 ENCOUNTER — Ambulatory Visit (INDEPENDENT_AMBULATORY_CARE_PROVIDER_SITE_OTHER): Payer: BC Managed Care – PPO | Admitting: Family Medicine

## 2013-10-29 VITALS — BP 95/66 | HR 65 | Temp 96.5°F | Ht 73.0 in | Wt 292.0 lb

## 2013-10-29 DIAGNOSIS — E039 Hypothyroidism, unspecified: Secondary | ICD-10-CM

## 2013-10-29 DIAGNOSIS — I10 Essential (primary) hypertension: Secondary | ICD-10-CM

## 2013-10-29 DIAGNOSIS — N400 Enlarged prostate without lower urinary tract symptoms: Secondary | ICD-10-CM

## 2013-10-29 DIAGNOSIS — G629 Polyneuropathy, unspecified: Secondary | ICD-10-CM

## 2013-10-29 DIAGNOSIS — M545 Low back pain, unspecified: Secondary | ICD-10-CM

## 2013-10-29 DIAGNOSIS — E785 Hyperlipidemia, unspecified: Secondary | ICD-10-CM

## 2013-10-29 DIAGNOSIS — N259 Disorder resulting from impaired renal tubular function, unspecified: Secondary | ICD-10-CM

## 2013-10-29 DIAGNOSIS — G589 Mononeuropathy, unspecified: Secondary | ICD-10-CM

## 2013-10-29 DIAGNOSIS — I8290 Acute embolism and thrombosis of unspecified vein: Secondary | ICD-10-CM

## 2013-10-29 DIAGNOSIS — I749 Embolism and thrombosis of unspecified artery: Secondary | ICD-10-CM

## 2013-10-29 DIAGNOSIS — E119 Type 2 diabetes mellitus without complications: Secondary | ICD-10-CM

## 2013-10-29 DIAGNOSIS — I421 Obstructive hypertrophic cardiomyopathy: Secondary | ICD-10-CM

## 2013-10-29 DIAGNOSIS — Z79899 Other long term (current) drug therapy: Secondary | ICD-10-CM | POA: Insufficient documentation

## 2013-10-29 DIAGNOSIS — E559 Vitamin D deficiency, unspecified: Secondary | ICD-10-CM

## 2013-10-29 DIAGNOSIS — N179 Acute kidney failure, unspecified: Secondary | ICD-10-CM

## 2013-10-29 DIAGNOSIS — R21 Rash and other nonspecific skin eruption: Secondary | ICD-10-CM

## 2013-10-29 LAB — POCT URINALYSIS DIPSTICK
BILIRUBIN UA: NEGATIVE
Glucose, UA: NEGATIVE
Ketones, UA: NEGATIVE
Leukocytes, UA: NEGATIVE
NITRITE UA: NEGATIVE
Protein, UA: NEGATIVE
RBC UA: NEGATIVE
SPEC GRAV UA: 1.025
Urobilinogen, UA: NEGATIVE
pH, UA: 5

## 2013-10-29 LAB — POCT CBC
GRANULOCYTE PERCENT: 57.3 % (ref 37–80)
HCT, POC: 38 % — AB (ref 43.5–53.7)
Hemoglobin: 11.9 g/dL — AB (ref 14.1–18.1)
LYMPH, POC: 2.2 (ref 0.6–3.4)
MCH, POC: 29.2 pg (ref 27–31.2)
MCHC: 31.3 g/dL — AB (ref 31.8–35.4)
MCV: 93.3 fL (ref 80–97)
MPV: 7.8 fL (ref 0–99.8)
PLATELET COUNT, POC: 154 10*3/uL (ref 142–424)
POC Granulocyte: 3.5 (ref 2–6.9)
POC LYMPH %: 36.2 % (ref 10–50)
RBC: 4.1 M/uL — AB (ref 4.69–6.13)
RDW, POC: 14 %
WBC: 6.1 10*3/uL (ref 4.6–10.2)

## 2013-10-29 LAB — POCT UA - MICROSCOPIC ONLY
BACTERIA, U MICROSCOPIC: NEGATIVE
CASTS, UR, LPF, POC: NEGATIVE
Crystals, Ur, HPF, POC: NEGATIVE
RBC, urine, microscopic: NEGATIVE
YEAST UA: NEGATIVE

## 2013-10-29 LAB — POCT WET PREP WITH KOH
Bacteria Wet Prep HPF POC: NEGATIVE
CLUE CELLS WET PREP PER HPF POC: NEGATIVE
KOH Prep POC: NEGATIVE
Trichomonas, UA: NEGATIVE
Yeast Wet Prep HPF POC: NEGATIVE

## 2013-10-29 LAB — POCT INR: INR: 2.4

## 2013-10-29 MED ORDER — INSULIN GLARGINE 100 UNIT/ML ~~LOC~~ SOLN: 50.0000 [IU] | Freq: Two times a day (BID) | SUBCUTANEOUS | Status: DC

## 2013-10-29 NOTE — Patient Instructions (Signed)
Anticoagulation Dose Instructions as of 10/29/2013     [name] Tue Wed Thu Fri Sat   New Dose 2.5 mg 2.5 mg 2.5 mg 5 mg 2.5 mg 2.5 mg 5 mg    Description       Continue on same dosage 1/2 tablet daily except 1 tablet on Wednesdays and Saturdays. Recheck in one week.      INR was 2.4 today

## 2013-10-29 NOTE — Patient Instructions (Addendum)
Continue current medications. Continue good therapeutic lifestyle changes which include good diet and exercise. Fall precautions discussed with patient. Schedule your flu vaccine if you haven't had it yet If you are over 58 years old - you may need Prevnar 13 or the adult Pneumonia vaccine. Please check with your insurance regarding the Prevnar vaccine advised to check blood pressures more regularly at home Take blood pressure readings to the cardiologist for review at your next visit with him Continue to watch diet and gradually improve exercise and activity Use appropriate cream on the rash on your back We will check x-rays of your back from the hospital--- these confirmed there was no fracture and just degenerative changes or wear and tear arthritis

## 2013-10-29 NOTE — Addendum Note (Signed)
Addended by: Prescott Gum on: 10/29/2013 11:59 AM   Modules accepted: Orders

## 2013-10-29 NOTE — Progress Notes (Signed)
Patient saw Dr Christell Constant today - pharamcist was asked to check INR.

## 2013-10-29 NOTE — Progress Notes (Signed)
Subjective:    Patient ID: Dakota Royal Sr., male    DOB: 1956/05/08, 58 y.o.   MRN: [id number]  HPI Pt here for follow up and management of chronic medical problems. Patient comes to the visit today with his wife. The patient is usual has no complaints. The good news is that he has a hemoglobin A1c which is much improved from the past on 10/01/2013 and this was 7.1%. He will need a PSA and prostate. He will also get his lab work done today and he will check with his insurance regarding the Prevnar vaccine. Patient notes that he failed several months ago and had a large contusion above his sacrum affecting the left side more than the right. He subsequently ended up in the hospital with pneumonia and heart failure on December 26. He was hospitalized for this for about a week. He is still feeling very weak and has some dizziness. He indicates that his blood sugars are under control better because he has been watching his diet much more closely. He complains of itching and a rash on his right sacral area. We will do a KOH prep on this particular area. Patient also needs his annual exam done today. The blood sugars were in overall these are good.        Patient Active Problem List   Diagnosis Date Noted  . High risk medication use 10/29/2013  . Thrombus 10/08/2013  . Acute respiratory failure 10/01/2013  . Sepsis 09/29/2013  . Acute renal failure 09/29/2013  . Cardiomyopathy 09/29/2013  . Diabetes mellitus 09/29/2013  . Metabolic acidosis 29/52/8413  . Hyperkalemia 09/29/2013  . Hypothyroidism (acquired) 04/09/2013  . Ventricular tachycardia 08/12/2011  . Acute on chronic systolic CHF (congestive heart failure) 01/27/2011  . OBESITY, UNSPECIFIED 01/07/2010  . RENAL INSUFFICIENCY 01/07/2010  . AUTOMATIC IMPLANTABLE CARDIAC DEFIBRILLATOR SITU 06/10/2009  . DM 01/16/2009  . HYPERLIPIDEMIA-MIXED 01/16/2009  . HYPERTENSION, UNSPECIFIED 01/16/2009  . MYOCARDIAL INFARCTION 01/16/2009  .  Hypertrophic obstructive cardiomyopathy(425.11) 01/16/2009  . CARDIOMYOPATHY, SECONDARY 01/16/2009  . AV BLOCK, COMPLETE 01/16/2009  . BRADYCARDIA 01/16/2009   Outpatient Encounter Prescriptions as of 10/29/2013  Medication Sig  . amiodarone (PACERONE) 400 MG tablet Take 200-400 mg by mouth daily. Take $RemoveBef'200mg'vKBrXMGrEo$  on Sat and Sun take $Remove'400mg'PSEiGCK$  all other days  . carvedilol (COREG) 25 MG tablet TAKE 1 TABLET TWICE A DAY  . cholecalciferol (VITAMIN D) 1000 UNITS tablet Take 2,000 Units by mouth daily.   . colesevelam (WELCHOL) 625 MG tablet Take 625 mg by mouth 3 (three) times daily.   . fish oil-omega-3 fatty acids 1000 MG capsule Take 3 capsules by mouth daily.   . furosemide (LASIX) 40 MG tablet Take 1 tablet (40 mg total) by mouth daily.  . insulin glargine (LANTUS) 100 UNIT/ML injection Inject 0.5 mLs (50 Units total) into the skin 2 (two) times daily.  . insulin lispro (HUMALOG KWIKPEN) 100 UNIT/ML SOPN Inject 20 units with largest/evening meal Dispense quikpens  . levothyroxine (SYNTHROID, LEVOTHROID) 150 MCG tablet Take 1 tablet (150 mcg total) by mouth daily.  [name] lisinopril (PRINIVIL,ZESTRIL) 2.5 MG tablet Take 1 tablet (2.5 mg total) by mouth 2 (two) times daily.  [name] mexiletine (MEXITIL) 150 MG capsule Take 1 capsule (150 mg total) by mouth every 8 (eight) hours.  . rosuvastatin (CRESTOR) 20 MG tablet Take 0.5 tablets (10 mg total) by mouth daily.  . sitaGLIPtin (JANUVIA) 50 MG tablet Take 1 tablet (50 mg total) by mouth daily.  [name] warfarin (COUMADIN)  5 MG tablet Take 1 tablet (5 mg total) by mouth daily.  . [DISCONTINUED] glimepiride (AMARYL) 4 MG tablet Take 1 tablet (4 mg total) by mouth 2 (two) times daily.    Review of Systems  Constitutional: Negative.   HENT: Negative.   Eyes: Negative.   Respiratory: Negative.   Cardiovascular: Negative.   Gastrointestinal: Negative.   Endocrine: Negative.   Genitourinary: Negative.   Musculoskeletal: Negative.   Skin: Negative.     Allergic/Immunologic: Negative.   Neurological: Negative.   Hematological: Negative.   Psychiatric/Behavioral: Negative.        Objective:   Physical Exam  Nursing note and vitals reviewed. Constitutional: He is oriented to person, place, and time. He appears well-developed and well-nourished. No distress.  Overweight and quiet demeanor as usual  HENT:  Head: Normocephalic and atraumatic.  Right Ear: External ear normal.  Left Ear: External ear normal.  Nose: Nose normal.  Mouth/Throat: Oropharynx is clear and moist. No oropharyngeal exudate.  Edentulous  Eyes: Conjunctivae and EOM are normal. Pupils are equal, round, and reactive to light. Right eye exhibits no discharge. Left eye exhibits no discharge. No scleral icterus.  Neck: Normal range of motion. Neck supple. No tracheal deviation present. No thyromegaly present.  Cardiovascular: Normal rate and regular rhythm.  Exam reveals no gallop and no friction rub.   No murmur heard. Heart sounds were distant and the rhythm appeared to be regular between 60 and 72 per minute. The pulse is in the distal extremity on the right foot were difficult to palpate  Pulmonary/Chest: Effort normal and breath sounds normal. No respiratory distress. He has no wheezes. He has no rales. He exhibits no tenderness.  No axillary adenopathy  Abdominal: Soft. Bowel sounds are normal. He exhibits no mass. There is no tenderness. There is no rebound and no guarding.  Obesity present, no inguinal adenopathy  Genitourinary: Rectum normal and penis normal.  The prostate was enlarged but soft and smooth. There were no rectal masses. There was no inguinal hernia. The external genitalia were normal.  Musculoskeletal: Normal range of motion. He exhibits no edema and no tenderness.  Lymphadenopathy:    He has no cervical adenopathy.  Neurological: He is alert and oriented to person, place, and time. He has normal reflexes. No cranial nerve deficit.  Skin: Skin  is warm and dry. Rash noted. No erythema. No pallor.  Patient had 2 circular areas superior and to the right of the sacrum skin scrapings were done of these areas for fungal components. There was also a resolving hematoma more to the left from the apparent fall the patient described several months ago.  Psychiatric: He has a normal mood and affect. His behavior is normal. Judgment and thought content normal.  Patient is quiet as usual. He does complain of severe pain in his low back after only riding for a short distance. This is requiring him to get out of the car and move around   BP 95/66  Pulse 65  Temp(Src) 96.5 F (35.8 C) (Oral)  Ht $R'6\' 1"'sd$  (1.854 m)  Wt 292 lb (132.45 kg)  BMI 38.53 kg/m2  Results for orders placed in visit on 10/29/13  POCT CBC      Result Value Range   WBC 6.1  4.6 - 10.2 K/uL   Lymph, poc 2.2  0.6 - 3.4   POC LYMPH PERCENT 36.2  10 - 50 %L   POC Granulocyte 3.5  2 - 6.9   Granulocyte percent  57.3  37 - 80 %G   RBC 4.1 (*) 4.69 - 6.13 M/uL   Hemoglobin 11.9 (*) 14.1 - 18.1 g/dL   HCT, POC 38.0 (*) 43.5 - 53.7 %   MCV 93.3  80 - 97 fL   MCH, POC 29.2  27 - 31.2 pg   MCHC 31.3 (*) 31.8 - 35.4 g/dL   RDW, POC 14.0     Platelet Count, POC 154.0  142 - 424 K/uL   MPV 7.8  0 - 99.8 fL  POCT INR      Result Value Range   INR 2.4    POCT WET PREP WITH KOH      Result Value Range   Trichomonas, UA Negative     Clue Cells Wet Prep HPF POC neg     Epithelial Wet Prep HPF POC occ     Yeast Wet Prep HPF POC neg     Bacteria Wet Prep HPF POC neg     RBC Wet Prep HPF POC 1-2     WBC Wet Prep HPF POC 1-5     KOH Prep POC Negative           Assessment & Plan:  1. Acute renal failure - POCT CBC - BMP8+EGFR  2. Diabetes mellitus - POCT CBC  3. HYPERLIPIDEMIA-MIXED - POCT CBC - NMR, lipoprofile  4. HYPERTENSION, UNSPECIFIED - POCT CBC - BMP8+EGFR - Hepatic function panel  5. Hypothyroidism (acquired) - POCT CBC - Thyroid Panel With TSH  6.  RENAL INSUFFICIENCY - POCT CBC - BMP8+EGFR  7. Hypertrophic obstructive cardiomyopathy(425.11) - POCT CBC  8. High risk medication use - POCT CBC  9. Vitamin D deficiency - Vit D  25 hydroxy (rtn osteoporosis monitoring)  10. BPH (benign prostatic hyperplasia) - POCT UA - Microscopic Only - POCT urinalysis dipstick - PSA, total and free  11. Diabetes - insulin glargine (LANTUS) 100 UNIT/ML injection; Inject 0.5 mLs (50 Units total) into the skin 2 (two) times daily.  Dispense: 90 mL; Refill: 3  12. Low back pain  13. Rash and nonspecific skin eruption -K. [place] -Alternate Lamisil cream and cortisone 10 for rash which looks fungal but that fungal prep was negative.  14. Annual exam  Meds ordered this encounter  Medications  . insulin glargine (LANTUS) 100 UNIT/ML injection    Sig: Inject 0.5 mLs (50 Units total) into the skin 2 (two) times daily.    Dispense:  90 mL    Refill:  3   Patient Instructions  Continue current medications. Continue good therapeutic lifestyle changes which include good diet and exercise. Fall precautions discussed with patient. Schedule your flu vaccine if you haven't had it yet If you are over 52 years old - you may need Prevnar 39 or the adult Pneumonia vaccine. Please check with your insurance regarding the Prevnar vaccine advised to check blood pressures more regularly at home Take blood pressure readings to the cardiologist for review at your next visit with him Continue to watch diet and gradually improve exercise and activity Use appropriate cream on the rash on your back We will check x-rays of your back from the hospital--- these confirmed there was no fracture and just degenerative changes or wear and tear arthritis    Arrie Senate MD

## 2013-10-30 LAB — THYROID PANEL WITH TSH
FREE THYROXINE INDEX: 3.3 (ref 1.2–4.9)
T3 UPTAKE RATIO: 29 % (ref 24–39)
T4, Total: 11.4 ug/dL (ref 4.5–12.0)
TSH: 18.47 u[IU]/mL — ABNORMAL HIGH (ref 0.450–4.500)

## 2013-10-30 LAB — BMP8+EGFR
BUN/Creatinine Ratio: 13 (ref 9–20)
BUN: 20 mg/dL (ref 6–24)
CALCIUM: 8.4 mg/dL — AB (ref 8.7–10.2)
CO2: 17 mmol/L — AB (ref 18–29)
Chloride: 105 mmol/L (ref 97–108)
Creatinine, Ser: 1.51 mg/dL — ABNORMAL HIGH (ref 0.76–1.27)
GFR calc Af Amer: 58 mL/min/{1.73_m2} — ABNORMAL LOW (ref >59)
GFR calc non Af Amer: 51 mL/min/{1.73_m2} — ABNORMAL LOW (ref >59)
Glucose: 165 mg/dL — ABNORMAL HIGH (ref 65–99)
Potassium: 4.6 mmol/L (ref 3.5–5.2)
Sodium: 139 mmol/L (ref 134–144)

## 2013-10-30 LAB — NMR, LIPOPROFILE
CHOLESTEROL: 139 mg/dL (ref <200)
HDL Cholesterol by NMR: 38 mg/dL — ABNORMAL LOW (ref >=40)
HDL PARTICLE NUMBER: 14.5 umol/L — AB (ref >=30.5)
LDL PARTICLE NUMBER: 1113 nmol/L — AB (ref <1000)
LDL Size: 20.7 nm (ref >20.5)
LDLC SERPL CALC-MCNC: 68 mg/dL (ref <100)
LP-IR SCORE: 36 (ref <=45)
SMALL LDL PARTICLE NUMBER: 497 nmol/L (ref <=527)
TRIGLYCERIDES BY NMR: 165 mg/dL — AB (ref <150)

## 2013-10-30 LAB — HEPATIC FUNCTION PANEL
ALBUMIN: 2.8 g/dL — AB (ref 3.5–5.5)
ALT: 51 IU/L — ABNORMAL HIGH (ref 0–44)
AST: 107 IU/L — AB (ref 0–40)
Alkaline Phosphatase: 211 IU/L — ABNORMAL HIGH (ref 39–117)
BILIRUBIN TOTAL: 0.4 mg/dL (ref 0.0–1.2)
Bilirubin, Direct: 0.21 mg/dL (ref 0.00–0.40)
TOTAL PROTEIN: 5.5 g/dL — AB (ref 6.0–8.5)

## 2013-10-30 LAB — VITAMIN D 25 HYDROXY (VIT D DEFICIENCY, FRACTURES): VIT D 25 HYDROXY: 40.1 ng/mL (ref 30.0–100.0)

## 2013-10-30 LAB — PSA, TOTAL AND FREE
PSA, Free Pct: 31.7 %
PSA, Free: 0.19 ng/mL
PSA: 0.6 ng/mL (ref 0.0–4.0)

## 2013-10-31 LAB — URINE CULTURE: ORGANISM ID, BACTERIA: NO GROWTH

## 2013-11-02 ENCOUNTER — Telehealth: Payer: Self-pay | Admitting: Cardiology

## 2013-11-02 NOTE — Telephone Encounter (Signed)
Spoke to patient's wife advised since patient on coumadin needs to go to ER.Appointment scheduled with Dr.Hochrein 11/08/13 at 11:30 am.

## 2013-11-02 NOTE — Telephone Encounter (Signed)
New problem   Pt need to speak to nurse concerning low blood pressure. Please call pt

## 2013-11-02 NOTE — Telephone Encounter (Signed)
Returned call to patient he stated he passed out appox 20 min ago.Stated he was in bathroom and blacked out.No chest pain,no fast heart beat.Wife checked B/P 84/61,88/57.Stated he feels good at present.Advised to see PCP Dr.Moore.State

## 2013-11-05 ENCOUNTER — Encounter: Payer: Self-pay | Admitting: Pharmacist

## 2013-11-05 ENCOUNTER — Ambulatory Visit (INDEPENDENT_AMBULATORY_CARE_PROVIDER_SITE_OTHER): Payer: BC Managed Care – PPO | Admitting: Pharmacist

## 2013-11-05 VITALS — BP 102/62 | HR 60

## 2013-11-05 DIAGNOSIS — Z23 Encounter for immunization: Secondary | ICD-10-CM

## 2013-11-05 DIAGNOSIS — I8290 Acute embolism and thrombosis of unspecified vein: Secondary | ICD-10-CM

## 2013-11-05 DIAGNOSIS — E119 Type 2 diabetes mellitus without complications: Secondary | ICD-10-CM

## 2013-11-05 DIAGNOSIS — I749 Embolism and thrombosis of unspecified artery: Secondary | ICD-10-CM

## 2013-11-05 LAB — POCT INR: INR: 2.5

## 2013-11-05 NOTE — Patient Instructions (Signed)
Anticoagulation Dose Instructions as of 11/05/2013     [name] Tue Wed Thu Fri Sat   New Dose 2.5 mg 2.5 mg 2.5 mg 5 mg 2.5 mg 2.5 mg 5 mg    Description       Continue on same dosage 1/2 tablet daily except 1 tablet on Wednesdays and Saturdays.      INR was 2.5 today

## 2013-11-05 NOTE — Progress Notes (Signed)
Filed Vitals:   11/05/13 1646  BP: 102/62  Pulse: 60     Patient felt faint and fell in bathroom Friday, 11/02/13.  He feels like it was related to his BP.  His wife checked BP 88/60.  He has also been having more frequent hypoglycemia during the night.  I recommended he decrease carvediolo to 12.5mg  bid - scheduled for F/U with Dr Kirtland Bouchard for Thursday 11/08/13. Also adjusted Lantus insulin from 50 to 48 units BID and also his Humalog from 20 to 16 units with supper.   RTC in 2 weeks. Henrene Pastor, PharmD, CPP

## 2013-11-05 NOTE — Telephone Encounter (Signed)
Noted - will follow up as scheduled.  Pt did not go to ER for evaluation and did not go to see PCP either.  He did have an INR and reported to them the fainting spell and that he felt fine now.

## 2013-11-06 ENCOUNTER — Ambulatory Visit (HOSPITAL_COMMUNITY): Payer: BC Managed Care – PPO

## 2013-11-08 ENCOUNTER — Ambulatory Visit (INDEPENDENT_AMBULATORY_CARE_PROVIDER_SITE_OTHER): Payer: BC Managed Care – PPO | Admitting: Cardiology

## 2013-11-08 ENCOUNTER — Encounter: Payer: Self-pay | Admitting: Cardiology

## 2013-11-08 VITALS — BP 100/80 | HR 72 | Ht 73.0 in | Wt 294.0 lb

## 2013-11-08 DIAGNOSIS — I429 Cardiomyopathy, unspecified: Secondary | ICD-10-CM

## 2013-11-08 NOTE — Patient Instructions (Signed)
The current medical regimen is effective;  continue present plan and medications.  Follow up in March in [place] office.

## 2013-11-08 NOTE — Progress Notes (Signed)
HPI The patient presents for followup of coronary disease cardiomyopathy and ventricular tachycardia. I last saw him in the hospital when he was there with pneumonia. He was to the schedule today because he had a syncopal episode last week. He said he got up walked a few yards to the bathroom. He been standing there having finished urinating when he felt lightheaded. He thought he would get to the bed to sit down before this happened he had probably a brief episode of unconsciousness. He found himself sitting on the floor he had no trauma. He doesn't think he was out for any long period of time. He didn't feel any palpitations. He has been having any orthostatic symptoms although he knows his blood pressure has been running low. He denied any chest pressure, neck or arm discomfort. He's had no weight gain or edema. He's been breathing well. He's not having any PND or orthopnea.  Allergies  Allergen Reactions  . Ace Inhibitors     AVOID ALL  BECAUSE OF HEART DISEASE   . Nitroglycerin Other (See Comments)    Reaction unknown  . Procan Sr [Procainamide Hcl]     PT. HAS HEART DISEASE CALLED HYPERTHROPHIC CARDIOMYOPATHY  . Quinidine     PT. HAS HEART DISEASE CALLED HYPERTHROPHIC CARDIOMYOPATHY      Current Outpatient Prescriptions  Medication Sig Dispense Refill  . amiodarone (PACERONE) 400 MG tablet Take 200-400 mg by mouth daily. Take 200mg  on Sat and Sun take 400mg  all other days      . carvedilol (COREG) 25 MG tablet Take 0.5 tablets (12.5 mg total) by mouth 2 (two) times daily with a meal.  180 tablet  0  . cholecalciferol (VITAMIN D) 1000 UNITS tablet Take 2,000 Units by mouth daily.       . fish oil-omega-3 fatty acids 1000 MG capsule Take 3 capsules by mouth daily.       . furosemide (LASIX) 40 MG tablet Take 1 tablet (40 mg total) by mouth daily.  90 tablet  1  . insulin glargine (LANTUS) 100 UNIT/ML injection Inject 0.48 mLs (48 Units total) into the skin 2 (two) times daily.  90 mL   3  . insulin lispro (HUMALOG KWIKPEN) 100 UNIT/ML KiwkPen Inject 16 units with largest/evening meal Dispense quikpens  30 mL  1  . levothyroxine (SYNTHROID, LEVOTHROID) 150 MCG tablet Take 1 tablet (150 mcg total) by mouth daily.  90 tablet  3  . lisinopril (PRINIVIL,ZESTRIL) 2.5 MG tablet Take 1 tablet (2.5 mg total) by mouth 2 (two) times daily.  180 tablet  3  . mexiletine (MEXITIL) 150 MG capsule Take 1 capsule (150 mg total) by mouth every 8 (eight) hours.  270 capsule  1  . rosuvastatin (CRESTOR) 20 MG tablet Take 0.5 tablets (10 mg total) by mouth daily.  21 tablet  0  . sitaGLIPtin (JANUVIA) 50 MG tablet Take 1 tablet (50 mg total) by mouth daily.  30 tablet  2  . warfarin (COUMADIN) 5 MG tablet Take 1 tablet (5 mg total) by mouth daily.  30 tablet  2  . WELCHOL 625 MG tablet TAKE 1 TABLET THREE TIMES A DAY  270 tablet  0   No current facility-administered medications for this visit.    Past Medical History  Diagnosis Date  . DM   . HYPERLIPIDEMIA-MIXED   . Obesity, unspecified   . HYPERTENSION, UNSPECIFIED   . CAD (coronary artery disease)     a. Nonobstructive moderate CAD by  cath - last 2012.  [name]. Hypertrophic obstructive cardiomyopathy   . Complete heart block     a. s/p pacemaker later upgraded to CRT-D.  [name]. CKD (chronic kidney disease)     a. Baseline Cr 1.4-1.5.  [name]. Chronic systolic CHF (congestive heart failure)     a. NICM (EF 25% in 2012, 20-25% in 09/2013). b. s/p BiV-ICD (MDT).  . Colon polyps   . Ventricular tachycardia     a. EPS/RFCA of VT in 2012. b. On amio, mexilitene. c. Last BiV-ICD gen change 05/2013 (MDT).  . Hypothyroidism   . Fatty liver disease, nonalcoholic   . BPH (benign prostatic hyperplasia)   . Habitual alcohol use     Past Surgical History  Procedure Laterality Date  . Pacemaker insertion      medtronic  . Cardiac catheterization    . Facial reconstruction surgery      after trauma in 2006    ROS:  As stated in the HPI and negative for  all other systems.  PHYSICAL EXAM BP 100/80  Pulse 72  Ht 6\' 1"  (1.854 m)  Wt 294 lb (133.358 kg)  BMI 38.80 kg/m2 GENERAL:  Well appearing HEENT:  Pupils equal round and reactive, fundi not visualized, oral mucosa unremarkable, edentulous NECK:  No jugular venous distention, waveform within normal limits, carotid upstroke brisk and symmetric, no bruits, no thyromegaly LUNGS:  Diffuse expiratory wheezes with decreased breath sounds. BACK:  No CVA tenderness CHEST:  Well healed device pocket HEART:  PMI not displaced or sustained,S1 and S2 within normal limits, no S3, no S4, no clicks, no rubs, no murmurs ABD:  Flat, positive bowel sounds normal in frequency in pitch, no bruits, no rebound, no guarding, no midline pulsatile mass, no hepatomegaly, no splenomegaly, obese EXT:  2 plus pulses throughout, trace edema, no cyanosis no clubbing SKIN:  No rashes no nodules  ASSESSMENT AND PLAN  SYNCOPE - I believe this was probably related to a drop in blood pressure. However, with his recent decompensated heart failure and the fact that he periodically has to take an extra dose of diuretic because of increasing weight I do not want to discontinue or reduce his medications. He had temporal rarely had his dose of beta blocker reduced but I like him to go back to the previous dose.  However, if he has recurrent symptoms I will likely need to adjust his medications downward.  Ventricular tachycardia -  He is up to date with follow up.  No change in therapy is planned.   CARDIOMYOPATHY, SECONDARY -  He seems to be euvolemic and he will continue the meds as listed.  MYOCARDIAL INFARCTION -  The patient has no new sypmtoms. No further cardiovascular testing is indicated. We will continue with aggressive risk reduction and meds as listed.   HYPERTENSION, UNSPECIFIED - This is being managed in the context of treating his CHF.  Again I will address low blood pressure as needed.

## 2013-11-19 ENCOUNTER — Ambulatory Visit (INDEPENDENT_AMBULATORY_CARE_PROVIDER_SITE_OTHER): Payer: BC Managed Care – PPO | Admitting: Pharmacist

## 2013-11-19 DIAGNOSIS — I8290 Acute embolism and thrombosis of unspecified vein: Secondary | ICD-10-CM

## 2013-11-19 DIAGNOSIS — I749 Embolism and thrombosis of unspecified artery: Secondary | ICD-10-CM

## 2013-11-19 LAB — POCT INR: INR: 1.9

## 2013-11-19 MED ORDER — WARFARIN SODIUM 5 MG PO TABS: ORAL_TABLET | ORAL | Status: DC

## 2013-11-19 NOTE — Patient Instructions (Signed)
Anticoagulation Dose Instructions as of 11/19/2013     [name] Tue Wed Thu Fri Sat   New Dose 2.5 mg 5 mg 2.5 mg 5 mg 2.5 mg 5 mg 2.5 mg    Description       Increase to 5mg  mondays, Wednesdays and fridays.  1/2 tablet all other days.      INR was 1.9 today

## 2013-12-05 ENCOUNTER — Ambulatory Visit (INDEPENDENT_AMBULATORY_CARE_PROVIDER_SITE_OTHER): Payer: BC Managed Care – PPO | Admitting: Pharmacist

## 2013-12-05 ENCOUNTER — Ambulatory Visit (INDEPENDENT_AMBULATORY_CARE_PROVIDER_SITE_OTHER): Payer: BC Managed Care – PPO | Admitting: Cardiology

## 2013-12-05 ENCOUNTER — Encounter: Payer: Self-pay | Admitting: Cardiology

## 2013-12-05 VITALS — BP 111/73 | HR 76 | Ht 73.0 in | Wt 300.0 lb

## 2013-12-05 DIAGNOSIS — I509 Heart failure, unspecified: Secondary | ICD-10-CM

## 2013-12-05 DIAGNOSIS — I8290 Acute embolism and thrombosis of unspecified vein: Secondary | ICD-10-CM

## 2013-12-05 DIAGNOSIS — I749 Embolism and thrombosis of unspecified artery: Secondary | ICD-10-CM

## 2013-12-05 DIAGNOSIS — I4729 Other ventricular tachycardia: Secondary | ICD-10-CM

## 2013-12-05 DIAGNOSIS — I5023 Acute on chronic systolic (congestive) heart failure: Secondary | ICD-10-CM

## 2013-12-05 DIAGNOSIS — I472 Ventricular tachycardia, unspecified: Secondary | ICD-10-CM

## 2013-12-05 DIAGNOSIS — I429 Cardiomyopathy, unspecified: Secondary | ICD-10-CM

## 2013-12-05 DIAGNOSIS — I428 Other cardiomyopathies: Secondary | ICD-10-CM

## 2013-12-05 LAB — POCT INR: INR: 1.4

## 2013-12-05 NOTE — Patient Instructions (Signed)
The current medical regimen is effective;  continue present plan and medications.  Follow up in 6 months with Dr Hochrein.  You will receive a letter in the mail 2 months before you are due.  Please call us when you receive this letter to schedule your follow up appointment.  

## 2013-12-05 NOTE — Progress Notes (Signed)
HPI The patient presents for followup of coronary disease cardiomyopathy and ventricular tachycardia. I saw him recently because of syncope. This was likely related to an orthostatic blood pressure drop. Since that time he's had no recurrence of these events. He still gets dyspnea walking back from his horse.  However, he's not describing any new PND or orthopnea. He's had no cough fevers or chills. He's had none of the symptoms that he had at the time of a hospitalization recently with pneumonia and questionable sepsis. He's had some mild lower externally swelling but no significant change in this. He's not had any palpitations and isn't describing orthostatic symptoms at this point.  Allergies  Allergen Reactions  . Ace Inhibitors     AVOID ALL  BECAUSE OF HEART DISEASE   . Nitroglycerin Other (See Comments)    Reaction unknown  . Procan Sr [Procainamide Hcl]     PT. HAS HEART DISEASE CALLED HYPERTHROPHIC CARDIOMYOPATHY  . Quinidine     PT. HAS HEART DISEASE CALLED HYPERTHROPHIC CARDIOMYOPATHY      Current Outpatient Prescriptions  Medication Sig Dispense Refill  . amiodarone (PACERONE) 400 MG tablet Take 200-400 mg by mouth daily. Take 200mg  on Sat and Sun take 400mg  all other days      . carvedilol (COREG) 25 MG tablet Take 0.5 tablets (12.5 mg total) by mouth 2 (two) times daily with a meal.  180 tablet  0  . fish oil-omega-3 fatty acids 1000 MG capsule Take 3 capsules by mouth daily.       . furosemide (LASIX) 40 MG tablet Take 1 tablet (40 mg total) by mouth daily.  90 tablet  1  . insulin glargine (LANTUS) 100 UNIT/ML injection Inject 0.48 mLs (48 Units total) into the skin 2 (two) times daily.  90 mL  3  . insulin lispro (HUMALOG KWIKPEN) 100 UNIT/ML KiwkPen Inject 16 units with largest/evening meal Dispense quikpens  30 mL  1  . levothyroxine (SYNTHROID, LEVOTHROID) 150 MCG tablet Take 1 tablet (150 mcg total) by mouth daily.  90 tablet  3  . lisinopril (PRINIVIL,ZESTRIL) 2.5  MG tablet Take 1 tablet (2.5 mg total) by mouth 2 (two) times daily.  180 tablet  3  . mexiletine (MEXITIL) 150 MG capsule Take 1 capsule (150 mg total) by mouth every 8 (eight) hours.  270 capsule  1  . rosuvastatin (CRESTOR) 20 MG tablet Take 0.5 tablets (10 mg total) by mouth daily.  21 tablet  0  . sitaGLIPtin (JANUVIA) 50 MG tablet Take 1 tablet (50 mg total) by mouth daily.  30 tablet  2  . warfarin (COUMADIN) 5 MG tablet Take 1 tablet by mouth daily or as directed by anticoagulation clinic  90 tablet  1  . WELCHOL 625 MG tablet TAKE 1 TABLET THREE TIMES A DAY  270 tablet  0  . cholecalciferol (VITAMIN D) 1000 UNITS tablet Take 2,000 Units by mouth daily.        No current facility-administered medications for this visit.    Past Medical History  Diagnosis Date  . DM   . HYPERLIPIDEMIA-MIXED   . Obesity, unspecified   . HYPERTENSION, UNSPECIFIED   . CAD (coronary artery disease)     a. Nonobstructive moderate CAD by cath - last 2012.  [name]. Hypertrophic obstructive cardiomyopathy   . Complete heart block     a. s/p pacemaker later upgraded to CRT-D.  [name]. CKD (chronic kidney disease)     a. Baseline Cr 1.4-1.5.  .[name]  Chronic systolic CHF (congestive heart failure)     a. NICM (EF 25% in 2012, 20-25% in 09/2013). b. s/p BiV-ICD (MDT).  . Colon polyps   . Ventricular tachycardia     a. EPS/RFCA of VT in 2012. b. On amio, mexilitene. c. Last BiV-ICD gen change 05/2013 (MDT).  . Hypothyroidism   . Fatty liver disease, nonalcoholic   . BPH (benign prostatic hyperplasia)   . Habitual alcohol use     Past Surgical History  Procedure Laterality Date  . Pacemaker insertion      medtronic  . Cardiac catheterization    . Facial reconstruction surgery      after trauma in 2006    ROS:  As stated in the HPI and negative for all other systems.  PHYSICAL EXAM BP 111/73  Pulse 76  Ht 6\' 1"  (1.854 m)  Wt 300 lb (136.079 kg)  BMI 39.59 kg/m2 GENERAL:  Well appearing NECK:  No jugular  venous distention, waveform within normal limits, carotid upstroke brisk and symmetric, no bruits, no thyromegaly LUNGS:  Diffuse expiratory wheezes with decreased breath sounds. CHEST:  Well healed device pocket HEART:  PMI not displaced or sustained,S1 and S2 within normal limits, no S3, no S4, no clicks, no rubs, no murmurs ABD:  Flat, positive bowel sounds normal in frequency in pitch, no bruits, no rebound, no guarding, no midline pulsatile mass, no hepatomegaly, no splenomegaly, obese EXT:  2 plus pulses throughout, trace edema, no cyanosis no clubbing SKIN:  No rashes no nodules  ASSESSMENT AND PLAN  SYNCOPE - He has had no recurrence of this. I suggested that he consider compression stockings area  Ventricular tachycardia -  He is up to date with follow up.  No change in therapy is planned.   CARDIOMYOPATHY, SECONDARY -  He seems to be euvolemic and he will continue the meds as listed.  MYOCARDIAL INFARCTION -  The patient has no new sypmtoms. No further cardiovascular testing is indicated. We will continue with aggressive risk reduction and meds as listed.

## 2013-12-05 NOTE — Patient Instructions (Signed)
Anticoagulation Dose Instructions as of 12/05/2013     [name] Tue Wed Thu Fri Sat   New Dose 5 mg 2.5 mg 5 mg 5 mg 5 mg 2.5 mg 5 mg    Description       Take 1 and 1/2 tablets for next 2 days. Then increase to 1/2 tablet on mondays and fridays and 1 tablet all other days.      INR was 1.4 today

## 2013-12-13 ENCOUNTER — Ambulatory Visit (INDEPENDENT_AMBULATORY_CARE_PROVIDER_SITE_OTHER): Payer: BC Managed Care – PPO | Admitting: Pharmacist

## 2013-12-13 DIAGNOSIS — I8290 Acute embolism and thrombosis of unspecified vein: Secondary | ICD-10-CM

## 2013-12-13 DIAGNOSIS — I749 Embolism and thrombosis of unspecified artery: Secondary | ICD-10-CM

## 2013-12-13 LAB — POCT INR: INR: 2.1

## 2013-12-13 NOTE — Patient Instructions (Signed)
Anticoagulation Dose Instructions as of 12/13/2013     [name] Tue Wed Thu Fri Sat   New Dose 5 mg 2.5 mg 5 mg 5 mg 5 mg 2.5 mg 5 mg    Description       Continue 1/2 tablet on mondays and fridays and 1 tablet all other days.      INR was 2.1 today

## 2013-12-30 ENCOUNTER — Other Ambulatory Visit: Payer: Self-pay | Admitting: Family Medicine

## 2014-01-03 ENCOUNTER — Ambulatory Visit (INDEPENDENT_AMBULATORY_CARE_PROVIDER_SITE_OTHER): Payer: BC Managed Care – PPO | Admitting: Pharmacist

## 2014-01-03 DIAGNOSIS — I8290 Acute embolism and thrombosis of unspecified vein: Secondary | ICD-10-CM

## 2014-01-03 DIAGNOSIS — I749 Embolism and thrombosis of unspecified artery: Secondary | ICD-10-CM

## 2014-01-03 LAB — POCT INR: INR: 1.8

## 2014-01-03 NOTE — Patient Instructions (Signed)
Anticoagulation Dose Instructions as of 01/03/2014     [name] Tue Wed Thu Fri Sat   New Dose 5 mg 2.5 mg 5 mg 5 mg 5 mg 5 mg 5 mg    Description       Increase to 1/2 tablet on mondays only and 1 tablet all other days.      INR was 1.8 today

## 2014-01-13 ENCOUNTER — Other Ambulatory Visit: Payer: Self-pay | Admitting: Family Medicine

## 2014-01-17 ENCOUNTER — Encounter: Payer: BC Managed Care – PPO | Admitting: *Deleted

## 2014-01-21 ENCOUNTER — Ambulatory Visit (INDEPENDENT_AMBULATORY_CARE_PROVIDER_SITE_OTHER): Payer: BC Managed Care – PPO | Admitting: Pharmacist

## 2014-01-21 DIAGNOSIS — I8290 Acute embolism and thrombosis of unspecified vein: Secondary | ICD-10-CM

## 2014-01-21 DIAGNOSIS — I749 Embolism and thrombosis of unspecified artery: Secondary | ICD-10-CM

## 2014-01-21 LAB — POCT INR: INR: 3

## 2014-01-21 NOTE — Patient Instructions (Signed)
Anticoagulation Dose Instructions as of 01/21/2014     [name] Tue Wed Thu Fri Sat   New Dose 5 mg 2.5 mg 5 mg 5 mg 5 mg 5 mg 5 mg    Description       Continue 1/2 tablet on mondays only and 1 tablet all other days.      INR was 3.0 today

## 2014-01-30 ENCOUNTER — Encounter: Payer: Self-pay | Admitting: *Deleted

## 2014-02-05 ENCOUNTER — Other Ambulatory Visit: Payer: Self-pay | Admitting: Cardiology

## 2014-02-14 ENCOUNTER — Telehealth: Payer: Self-pay | Admitting: Family Medicine

## 2014-02-14 NOTE — Telephone Encounter (Signed)
Pt to call pharmacy first

## 2014-02-27 IMAGING — US US ABDOMEN COMPLETE
1 series · 13 of 25 positions shown · non-contrast
Comparison: None

CLINICAL DATA: Elevated LFTs, history diabetes, unspecified
hypertension, hyperlipidemia, hypertrophic obstructive
cardiomyopathy, an MRI, renal insufficiency

ULTRASOUND ABDOMEN:
TECHNIQUE: Sonography of upper abdominal structures was performed.

[Series 1: us abdomen complete · 0.28mm/px · 13 of 99 slices shown]
[im 1/99]
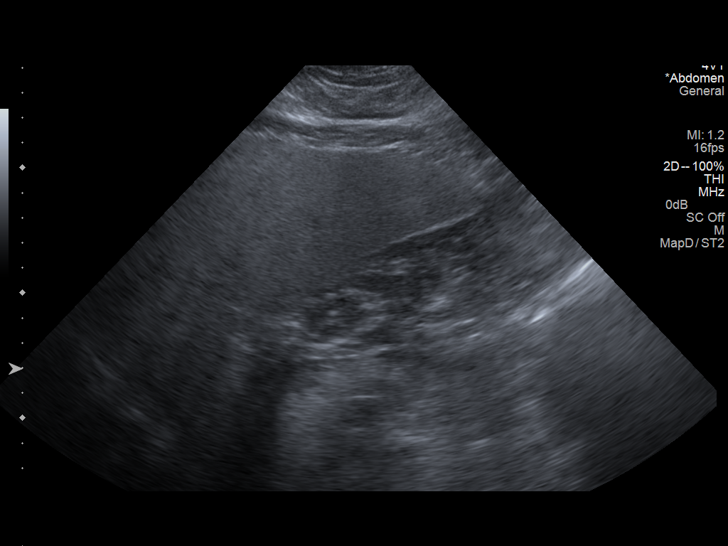
[im 9/99]
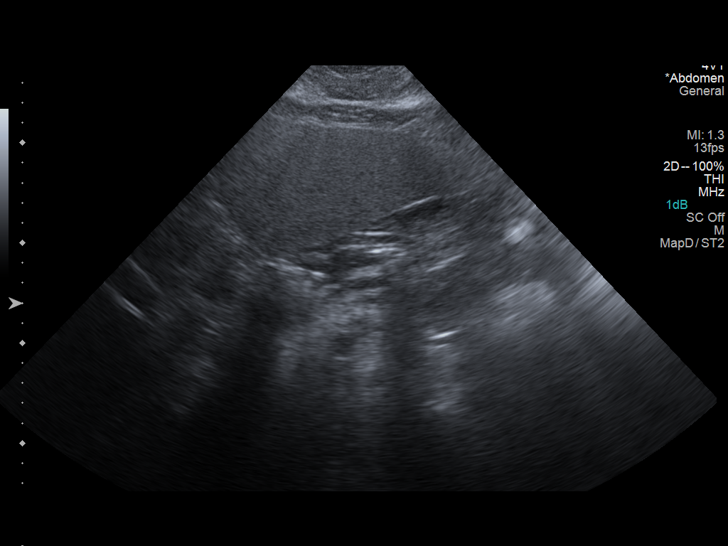
[im 17/99]
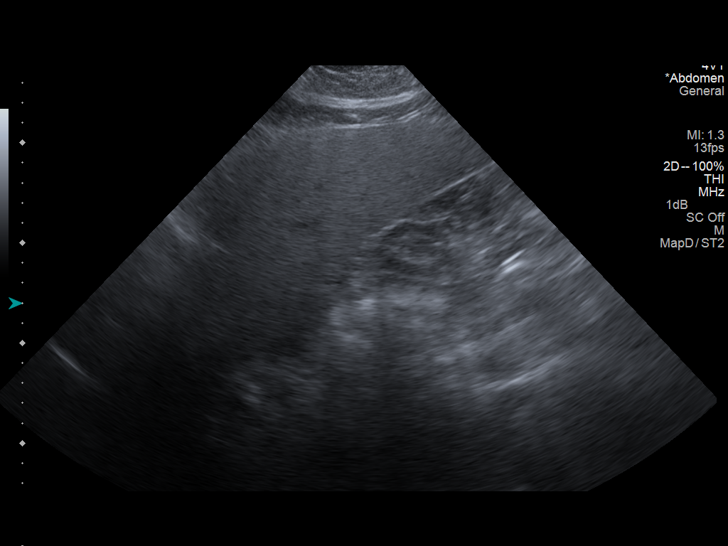
[im 25/99]
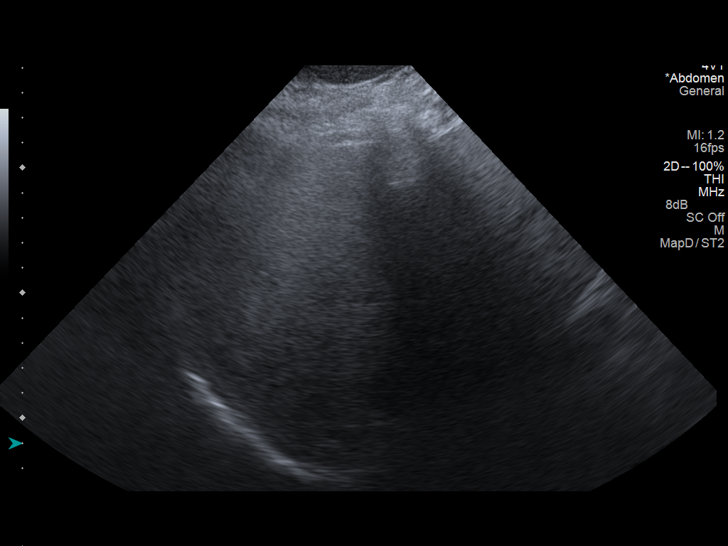
[im 33/99]
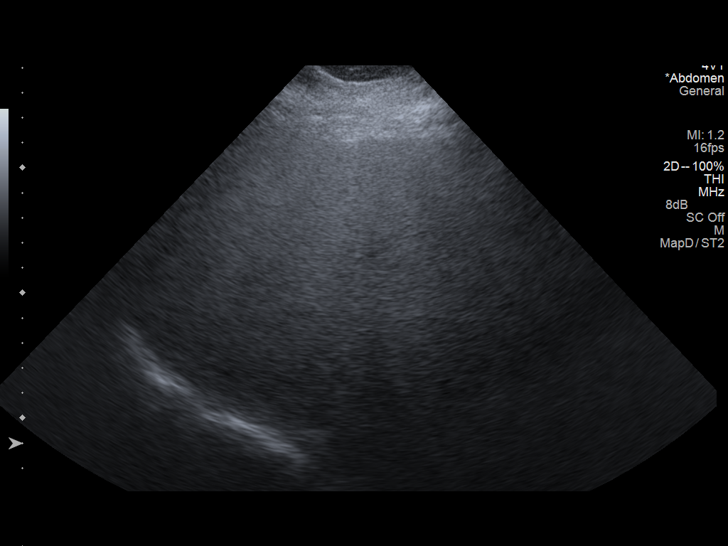
[im 41/99]
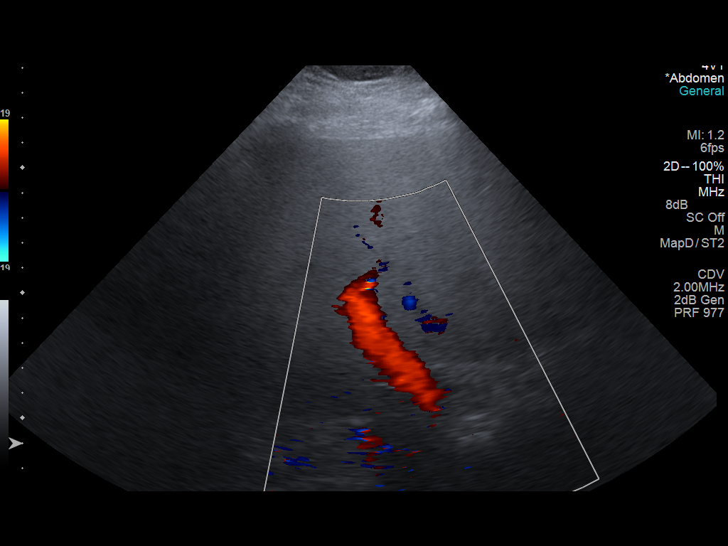
[im 50/99]
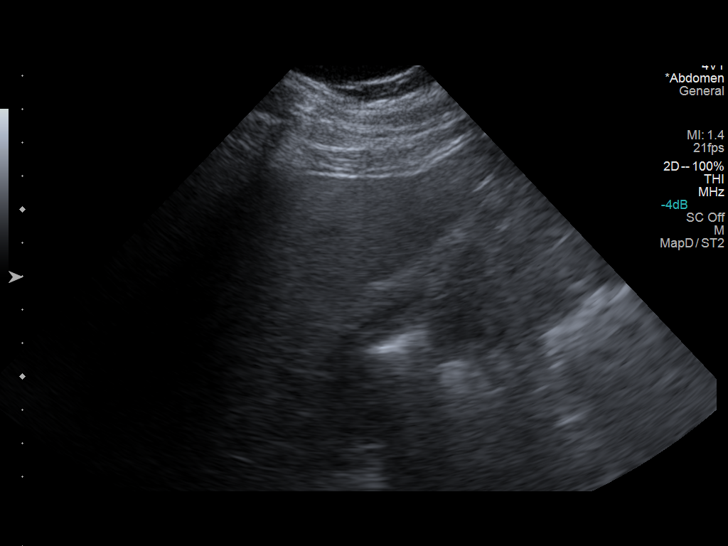
[im 58/99]
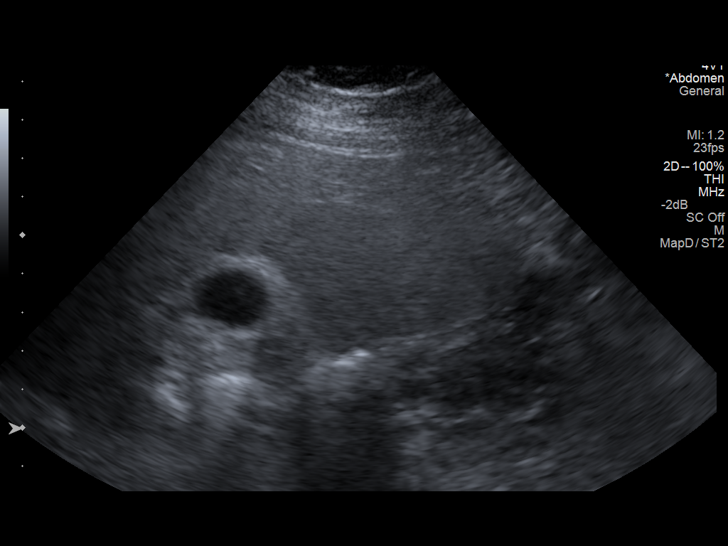
[im 66/99]
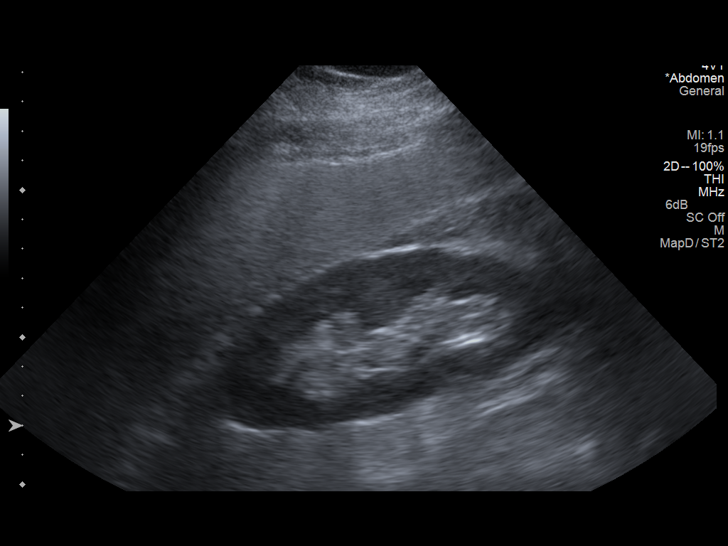
[im 74/99]
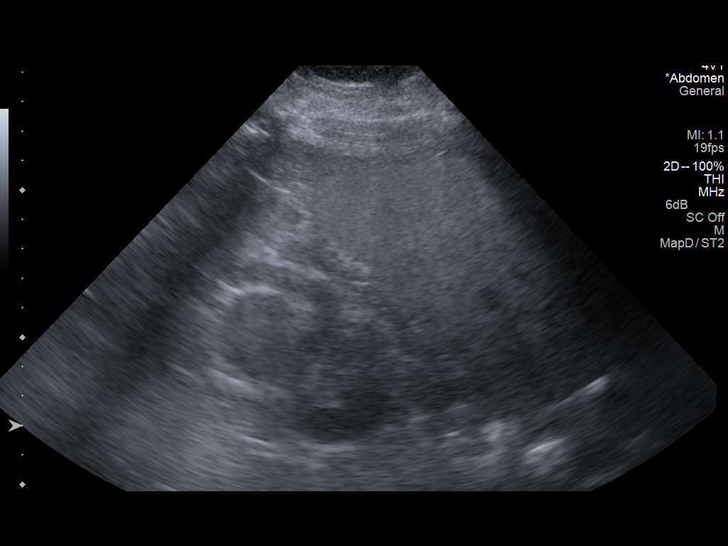
[im 82/99]
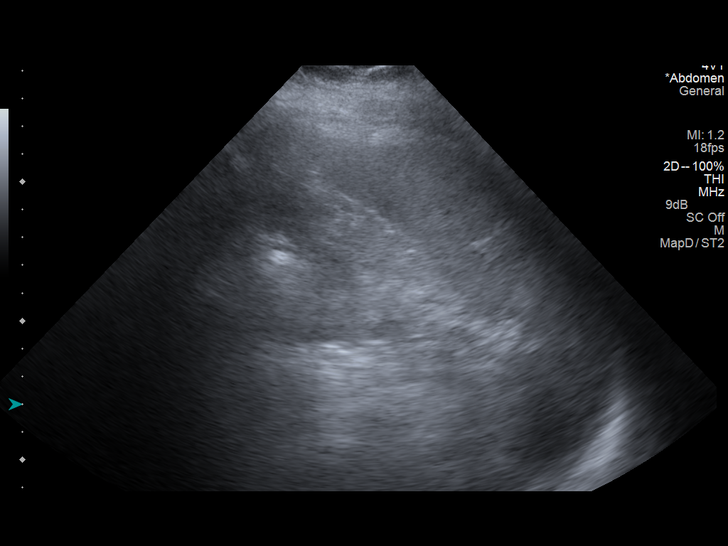
[im 90/99]
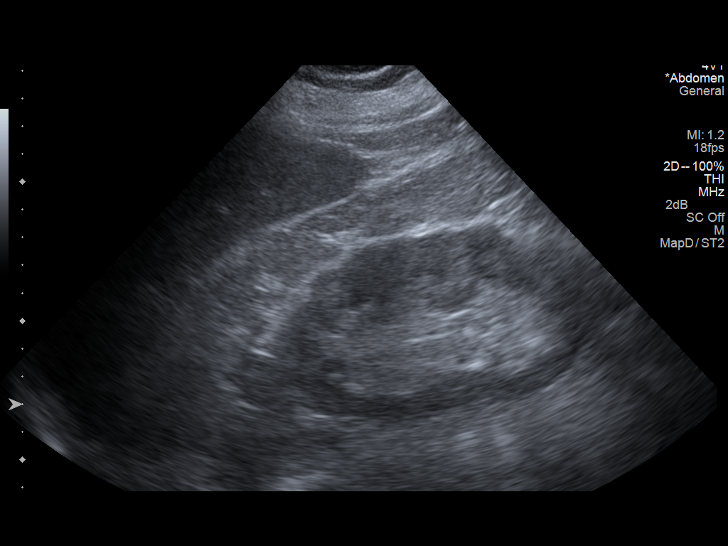
[im 99/99]
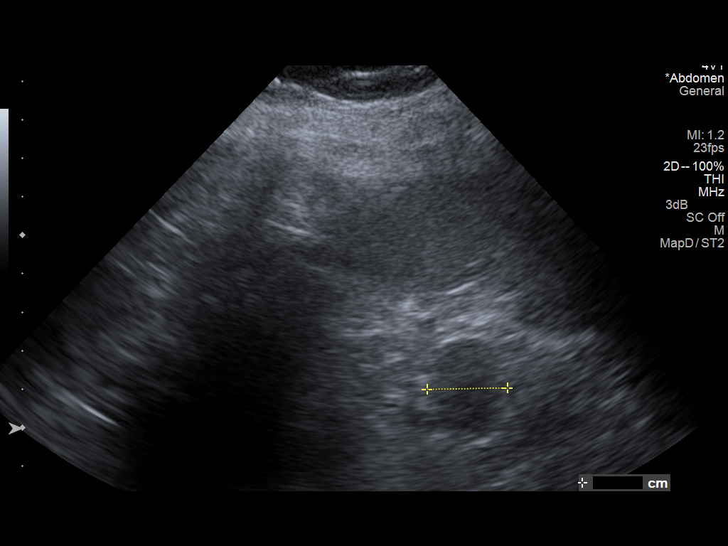

[13 of 25 positions shown; findings below may reference images not displayed]

Gallbladder:  Mild gallbladder wall thickening 4.2 mm thick.  No
gallstones, sonographic Murphy's sign or pericholecystic fluid
identified.

Common bile duct:  5 mm diameter, normal

Liver:  Echogenic, likely fatty infiltration, though this can be
seen with cirrhosis and certain infiltrative disorders.  Liver
measures 20.1 cm length.  Suboptimal assessment of the intrahepatic
detail due to sound attenuation.  No gross hepatic mass or
nodularity though intrahepatic pathology is not excluded.
Hepatopetal portal venous flow.

IVC:  Normal appearance

Pancreas:  Tail obscured by bowel gas.  Visualized portions of
pancreatic head and body grossly normal in appearance.

Spleen:  Normal appearance, appears borderline prominent in size at
11.5 cm length.

Right kidney:  12.7 cm length. Normal morphology without mass or
hydronephrosis.

Left kidney:  12.0 cm length Normal morphology without mass or
hydronephrosis.

Aorta:  Obscured by bowel gas at mid and distal portions, small
portion of proximal aorta normal caliber

Other:  Hypoechoic nodule at splenic hilum question splenule 2.8 x
2.6 x 2.1 cm, echogenicity similar to the spleen.
IMPRESSION: Probable fatty infiltration of liver as above with suboptimal
assessment of intrahepatic detail.
Incomplete visualization of pancreatic tail.
Borderline increased size of spleen.
Probable splenule.
Mild gallbladder wall thickening without definite evidence of
cholelithiasis or sonographic Murphy's sign.
If better intra abdominal visualization is required, recommend CT
with IV and oral contrast.

## 2014-02-28 ENCOUNTER — Encounter: Payer: Self-pay | Admitting: Family Medicine

## 2014-02-28 ENCOUNTER — Ambulatory Visit (INDEPENDENT_AMBULATORY_CARE_PROVIDER_SITE_OTHER): Payer: BC Managed Care – PPO | Admitting: Family Medicine

## 2014-02-28 VITALS — BP 98/65 | HR 81 | Temp 96.5°F | Ht 73.0 in | Wt 304.0 lb

## 2014-02-28 DIAGNOSIS — I8290 Acute embolism and thrombosis of unspecified vein: Secondary | ICD-10-CM

## 2014-02-28 DIAGNOSIS — Z79899 Other long term (current) drug therapy: Secondary | ICD-10-CM

## 2014-02-28 DIAGNOSIS — E559 Vitamin D deficiency, unspecified: Secondary | ICD-10-CM

## 2014-02-28 DIAGNOSIS — I429 Cardiomyopathy, unspecified: Secondary | ICD-10-CM

## 2014-02-28 DIAGNOSIS — I10 Essential (primary) hypertension: Secondary | ICD-10-CM

## 2014-02-28 DIAGNOSIS — N259 Disorder resulting from impaired renal tubular function, unspecified: Secondary | ICD-10-CM

## 2014-02-28 DIAGNOSIS — E119 Type 2 diabetes mellitus without complications: Secondary | ICD-10-CM

## 2014-02-28 DIAGNOSIS — I428 Other cardiomyopathies: Secondary | ICD-10-CM

## 2014-02-28 DIAGNOSIS — E785 Hyperlipidemia, unspecified: Secondary | ICD-10-CM

## 2014-02-28 DIAGNOSIS — I749 Embolism and thrombosis of unspecified artery: Secondary | ICD-10-CM

## 2014-02-28 DIAGNOSIS — E039 Hypothyroidism, unspecified: Secondary | ICD-10-CM

## 2014-02-28 DIAGNOSIS — Z95810 Presence of automatic (implantable) cardiac defibrillator: Secondary | ICD-10-CM

## 2014-02-28 LAB — POCT CBC
Granulocyte percent: 69.9 %G (ref 37–80)
HCT, POC: 40.5 % — AB (ref 43.5–53.7)
HEMOGLOBIN: 13.3 g/dL — AB (ref 14.1–18.1)
Lymph, poc: 1.8 (ref 0.6–3.4)
MCH: 27.8 pg (ref 27–31.2)
MCHC: 32.7 g/dL (ref 31.8–35.4)
MCV: 84.9 fL (ref 80–97)
MPV: 8.6 fL (ref 0–99.8)
POC Granulocyte: 5.3 (ref 2–6.9)
POC LYMPH PERCENT: 23.8 %L (ref 10–50)
Platelet Count, POC: 115 10*3/uL — AB (ref 142–424)
RBC: 4.8 M/uL (ref 4.69–6.13)
RDW, POC: 15 %
WBC: 7.6 10*3/uL (ref 4.6–10.2)

## 2014-02-28 LAB — POCT UA - MICROALBUMIN: Microalbumin Ur, POC: NEGATIVE mg/L

## 2014-02-28 LAB — POCT GLYCOSYLATED HEMOGLOBIN (HGB A1C)

## 2014-02-28 LAB — POCT INR: INR: 1.7

## 2014-02-28 MED ORDER — FUROSEMIDE 40 MG PO TABS: ORAL_TABLET | ORAL | Status: DC

## 2014-02-28 MED ORDER — WELCHOL 625 MG PO TABS
ORAL_TABLET | ORAL | Status: DC
Start: 2014-02-28 — End: 2014-10-11

## 2014-02-28 MED ORDER — WARFARIN SODIUM 5 MG PO TABS: ORAL_TABLET | ORAL | Status: DC

## 2014-02-28 MED ORDER — MEXILETINE HCL 150 MG PO CAPS: ORAL_CAPSULE | ORAL | Status: AC

## 2014-02-28 MED ORDER — AMIODARONE HCL 400 MG PO TABS: 200.0000 mg | ORAL_TABLET | Freq: Every day | ORAL | Status: DC

## 2014-02-28 MED ORDER — INSULIN LISPRO 100 UNIT/ML (KWIKPEN)
PEN_INJECTOR | SUBCUTANEOUS | Status: AC
Start: 2014-02-28 — End: ?

## 2014-02-28 NOTE — Progress Notes (Signed)
Subjective:    Patient ID: Dakota Royal Sr., male    DOB: 05/28/1956, 58 y.o.   MRN: [id number]  HPI Pt here for follow up and management of chronic medical problems. The patient comes to the visit today with his wife. He sees a cardiologist regularly because of his cardiomyopathy and has a defibrillator in place. On health maintenance issues he is due to return and FOBT, get lab work and have a urine microalbumin done today. He'll also get his pro time done today. He does bring in his home blood sugars and overall they appear to be improved from the past visit. These go back to February. We will scan into the record. The patient has not had any cardiac issues since Christmas time. At that point in time he had pneumonia congestive heart failure et Ronney Asters. He's been working with the clinical pharmacists in the practice to keep his blood sugars down or.        Patient Active Problem List   Diagnosis Date Noted  . High risk medication use 10/29/2013  . Thrombus 10/08/2013  . Acute respiratory failure 10/01/2013  . Sepsis 09/29/2013  . Acute renal failure 09/29/2013  . Cardiomyopathy 09/29/2013  . Diabetes mellitus 09/29/2013  . Metabolic acidosis 71/24/5809  . Hyperkalemia 09/29/2013  . Hypothyroidism (acquired) 04/09/2013  . Ventricular tachycardia 08/12/2011  . Acute on chronic systolic CHF (congestive heart failure) 01/27/2011  . OBESITY, UNSPECIFIED 01/07/2010  . RENAL INSUFFICIENCY 01/07/2010  . AUTOMATIC IMPLANTABLE CARDIAC DEFIBRILLATOR SITU 06/10/2009  . DM 01/16/2009  . HYPERLIPIDEMIA-MIXED 01/16/2009  . HYPERTENSION, UNSPECIFIED 01/16/2009  . MYOCARDIAL INFARCTION 01/16/2009  . Hypertrophic obstructive cardiomyopathy(425.11) 01/16/2009  . CARDIOMYOPATHY, SECONDARY 01/16/2009  . AV BLOCK, COMPLETE 01/16/2009  . BRADYCARDIA 01/16/2009   Outpatient Encounter Prescriptions as of 02/28/2014  Medication Sig  . amiodarone (PACERONE) 400 MG tablet Take 200-400 mg by mouth  daily. Take $RemoveBef'200mg'mccKzVZflr$  on Sat and Sun take $Remove'400mg'ScidFVe$  all other days  . carvedilol (COREG) 25 MG tablet Take 1 tablet (25 mg total) by mouth 2 (two) times daily with a meal.  . cholecalciferol (VITAMIN D) 1000 UNITS tablet Take 2,000 Units by mouth daily.   . fish oil-omega-3 fatty acids 1000 MG capsule Take 3 capsules by mouth daily.   . furosemide (LASIX) 40 MG tablet TAKE 1 TABLET DAILY  . insulin glargine (LANTUS) 100 UNIT/ML injection Inject 0.48 mLs (48 Units total) into the skin 2 (two) times daily.  . insulin lispro (HUMALOG KWIKPEN) 100 UNIT/ML KiwkPen Inject 16 units with largest/evening meal Dispense quikpens  . levothyroxine (SYNTHROID, LEVOTHROID) 150 MCG tablet Take 1 tablet (150 mcg total) by mouth daily.  [name] lisinopril (PRINIVIL,ZESTRIL) 2.5 MG tablet Take 1 tablet (2.5 mg total) by mouth 2 (two) times daily.  [name] mexiletine (MEXITIL) 150 MG capsule TAKE 1 CAPSULE EVERY 8 HOURS  . rosuvastatin (CRESTOR) 20 MG tablet Take 0.5 tablets (10 mg total) by mouth daily.  . sitaGLIPtin (JANUVIA) 50 MG tablet Take 1 tablet (50 mg total) by mouth daily.  [name] warfarin (COUMADIN) 5 MG tablet Take 1 tablet by mouth daily or as directed by anticoagulation clinic  . WELCHOL 625 MG tablet TAKE 1 TABLET THREE TIMES A DAY    Review of Systems  Constitutional: Negative.   HENT: Negative.   Eyes: Negative.   Respiratory: Negative.   Cardiovascular: Negative.   Gastrointestinal: Negative.   Endocrine: Negative.   Genitourinary: Negative.   Musculoskeletal: Negative.   Skin: Negative.   Allergic/Immunologic: Negative.  Neurological: Negative.   Hematological: Negative.   Psychiatric/Behavioral: Negative.        Objective:   Physical Exam  Nursing note and vitals reviewed. Constitutional: He is oriented to person, place, and time. He appears well-developed and well-nourished. No distress.  Patient is calm and quiet as usual with no complaints. I did speak with his wife regarding his overall demeanor  and she indicates that things seem to be better at home with him taking better control of his health care situation.  HENT:  Head: Normocephalic and atraumatic.  Right Ear: External ear normal.  Left Ear: External ear normal.  Nose: Nose normal.  Mouth/Throat: Oropharynx is clear and moist. No oropharyngeal exudate.  Eyes: Conjunctivae and EOM are normal. Pupils are equal, round, and reactive to light. Right eye exhibits no discharge. Left eye exhibits no discharge. No scleral icterus.  Neck: Normal range of motion. Neck supple. No thyromegaly present.  Cardiovascular: Normal rate, regular rhythm, normal heart sounds and intact distal pulses.  Exam reveals no gallop and no friction rub.   No murmur heard. At 60 per minute  Pulmonary/Chest: Effort normal and breath sounds normal. No respiratory distress. He has no wheezes. He has no rales. He exhibits no tenderness.  Abdominal: Soft. Bowel sounds are normal. He exhibits no mass. There is no tenderness. There is no rebound and no guarding.  Genitourinary: Penis normal.  Musculoskeletal: Normal range of motion. He exhibits no edema and no tenderness.  Lymphadenopathy:    He has no cervical adenopathy.  Neurological: He is alert and oriented to person, place, and time. He has normal reflexes. No cranial nerve deficit.  Skin: Skin is warm and dry. No rash noted. No erythema. No pallor.  Psychiatric: He has a normal mood and affect. His behavior is normal. Judgment and thought content normal.   BP 98/65  Pulse 81  Temp(Src) 96.5 F (35.8 C) (Oral)  Ht $R'6\' 1"'GF$  (1.854 m)  Wt 304 lb (137.893 kg)  BMI 40.12 kg/m2        Assessment & Plan:  1. DM - POCT CBC - POCT glycosylated hemoglobin (Hb A1C) - POCT UA - Microalbumin  2. HYPERLIPIDEMIA-MIXED - POCT CBC - NMR, lipoprofile  3. HYPERTENSION, UNSPECIFIED - POCT CBC - POCT UA - Microalbumin - BMP8+EGFR - Hepatic function panel  4. Hypothyroidism (acquired) - POCT CBC - Thyroid  Panel With TSH  5. Vitamin D deficiency - Vit D  25 hydroxy (rtn osteoporosis monitoring)  6. Diabetes - insulin lispro (HUMALOG KWIKPEN) 100 UNIT/ML KiwkPen; Inject 16 units with largest/evening meal.Dispense quikpens  Dispense: 30 mL; Refill: 11  7. Thrombus - POCT INR  8. AUTOMATIC IMPLANTABLE CARDIAC DEFIBRILLATOR SITU  9. RENAL INSUFFICIENCY  10. High risk medication use  11. Cardiomyopathy -Continue to followup with the cardiologist Meds ordered this encounter  Medications  . amiodarone (PACERONE) 400 MG tablet    Sig: Take 0.5-1 tablets (200-400 mg total) by mouth daily. Take $RemoveBef'200mg'ciSwkxHeuR$  on Sat and Sun take $Remove'400mg'fkTrbsy$  all other days    Dispense:  90 tablet    Refill:  3  . furosemide (LASIX) 40 MG tablet    Sig: TAKE 1 TABLET DAILY    Dispense:  90 tablet    Refill:  3  . mexiletine (MEXITIL) 150 MG capsule    Sig: TAKE 1 CAPSULE EVERY 8 HOURS    Dispense:  270 capsule    Refill:  3  . WELCHOL 625 MG tablet    Sig: TAKE  1 TABLET THREE TIMES A DAY    Dispense:  270 tablet    Refill:  3  . warfarin (COUMADIN) 5 MG tablet    Sig: Take 1 tablet by mouth daily or as directed by anticoagulation clinic    Dispense:  90 tablet    Refill:  2  . insulin lispro (HUMALOG KWIKPEN) 100 UNIT/ML KiwkPen    Sig: Inject 16 units with largest/evening meal.Dispense quikpens    Dispense:  30 mL    Refill:  11   Patient Instructions  Continue current medications. Continue good therapeutic lifestyle changes which include good diet and exercise. Fall precautions discussed with patient. If an FOBT was given today- please return it to our front desk. If you are over 51 years old - you may need Prevnar 54 or the adult Pneumonia vaccine.  Continue to followup with the cardiologist. Continue to monitor blood sugars regularly. Continue to exercise regularly   Arrie Senate MD

## 2014-02-28 NOTE — Patient Instructions (Addendum)
Continue current medications. Continue good therapeutic lifestyle changes which include good diet and exercise. Fall precautions discussed with patient. If an FOBT was given today- please return it to our front desk. If you are over 58 years old - you may need Prevnar 13 or the adult Pneumonia vaccine.  Continue to followup with the cardiologist. Continue to monitor blood sugars regularly. Continue to exercise regularly  Anticoagulation Dose Instructions as of 02/28/2014     [name] Tue Wed Thu Fri Sat   New Dose 5 mg 2.5 mg 5 mg 5 mg 5 mg 5 mg 5 mg    Description       Take 1 and 1/2 tablets today then continue 1/2 tablet on mondays only and 1 tablet all other days.      INR was 1.7 today

## 2014-03-01 ENCOUNTER — Encounter: Payer: Self-pay | Admitting: Internal Medicine

## 2014-03-01 ENCOUNTER — Telehealth: Payer: Self-pay | Admitting: Family Medicine

## 2014-03-01 LAB — NMR, LIPOPROFILE
Cholesterol: 163 mg/dL (ref 100–199)
HDL CHOLESTEROL BY NMR: 29 mg/dL — AB (ref >39)
HDL Particle Number: 19 umol/L — ABNORMAL LOW (ref >=30.5)
LDL Particle Number: 1457 nmol/L — ABNORMAL HIGH (ref <1000)
LDL SIZE: 21.4 nm (ref >20.5)
LDLC SERPL CALC-MCNC: 100 mg/dL — ABNORMAL HIGH (ref 0–99)
LP-IR Score: 30 (ref <=45)
SMALL LDL PARTICLE NUMBER: 648 nmol/L — AB (ref <=527)
TRIGLYCERIDES BY NMR: 171 mg/dL — AB (ref 0–149)

## 2014-03-01 LAB — HEPATIC FUNCTION PANEL
ALBUMIN: 3.6 g/dL (ref 3.5–5.5)
ALT: 88 IU/L — ABNORMAL HIGH (ref 0–44)
AST: 150 IU/L — AB (ref 0–40)
Alkaline Phosphatase: 126 IU/L — ABNORMAL HIGH (ref 39–117)
BILIRUBIN DIRECT: 0.15 mg/dL (ref 0.00–0.40)
BILIRUBIN TOTAL: 0.5 mg/dL (ref 0.0–1.2)
Total Protein: 6.7 g/dL (ref 6.0–8.5)

## 2014-03-01 LAB — BMP8+EGFR
BUN / CREAT RATIO: 19 (ref 9–20)
BUN: 31 mg/dL — AB (ref 6–24)
CO2: 25 mmol/L (ref 18–29)
CREATININE: 1.67 mg/dL — AB (ref 0.76–1.27)
Calcium: 9.3 mg/dL (ref 8.7–10.2)
Chloride: 106 mmol/L (ref 97–108)
GFR calc Af Amer: 51 mL/min/{1.73_m2} — ABNORMAL LOW (ref >59)
GFR, EST NON AFRICAN AMERICAN: 44 mL/min/{1.73_m2} — AB (ref >59)
Glucose: 83 mg/dL (ref 65–99)
Potassium: 4.6 mmol/L (ref 3.5–5.2)
SODIUM: 144 mmol/L (ref 134–144)

## 2014-03-01 LAB — VITAMIN D 25 HYDROXY (VIT D DEFICIENCY, FRACTURES): VIT D 25 HYDROXY: 35.7 ng/mL (ref 30.0–100.0)

## 2014-03-01 LAB — THYROID PANEL WITH TSH
Free Thyroxine Index: 2.9 (ref 1.2–4.9)
T3 Uptake Ratio: 28 % (ref 24–39)
T4, Total: 10.3 ug/dL (ref 4.5–12.0)
TSH: 8.8 u[IU]/mL — AB (ref 0.450–4.500)

## 2014-03-01 NOTE — Telephone Encounter (Signed)
Message copied by Azalee Course on Fri Mar 01, 2014  9:07 AM ------      Message from: Ernestina Penna      Created: Thu Feb 28, 2014 12:38 PM       The white blood cell count is normal. The hemoglobin is slightly decreased, but actually improved over the past several months at 13.3. Platelet count is decreased at 115,000.--- We will continue to monitor this at future visits ------

## 2014-03-04 ENCOUNTER — Other Ambulatory Visit: Payer: Self-pay | Admitting: *Deleted

## 2014-03-04 DIAGNOSIS — E039 Hypothyroidism, unspecified: Secondary | ICD-10-CM

## 2014-03-04 MED ORDER — LEVOTHYROXINE SODIUM 25 MCG PO TABS: 12.5000 ug | ORAL_TABLET | Freq: Every day | ORAL | Status: DC

## 2014-03-04 MED ORDER — ROSUVASTATIN CALCIUM 20 MG PO TABS: 10.0000 mg | ORAL_TABLET | Freq: Every day | ORAL | Status: DC

## 2014-03-04 NOTE — Addendum Note (Signed)
Addended by: Gwenith Daily on: 03/04/2014 10:06 AM   Modules accepted: Orders

## 2014-03-14 ENCOUNTER — Ambulatory Visit (INDEPENDENT_AMBULATORY_CARE_PROVIDER_SITE_OTHER): Payer: BC Managed Care – PPO | Admitting: Pharmacist

## 2014-03-14 DIAGNOSIS — I8290 Acute embolism and thrombosis of unspecified vein: Secondary | ICD-10-CM

## 2014-03-14 DIAGNOSIS — I749 Embolism and thrombosis of unspecified artery: Secondary | ICD-10-CM

## 2014-03-14 LAB — POCT INR: INR: 2.1

## 2014-03-14 NOTE — Patient Instructions (Signed)
Anticoagulation Dose Instructions as of 03/14/2014     [name] Tue Wed Thu Fri Sat   New Dose 5 mg 2.5 mg 5 mg 5 mg 5 mg 5 mg 5 mg    Description       Continue 1/2 tablet on mondays only and 1 tablet all other days.      Make record of 3 days of everything you eat and check blood glucose / sugar 2 to 3 times on those days.  Bring to next appointment to review diet and blood glusocse

## 2014-04-08 ENCOUNTER — Encounter: Payer: Self-pay | Admitting: Pharmacist

## 2014-04-08 ENCOUNTER — Encounter: Payer: BC Managed Care – PPO | Admitting: Internal Medicine

## 2014-04-08 ENCOUNTER — Ambulatory Visit (INDEPENDENT_AMBULATORY_CARE_PROVIDER_SITE_OTHER): Payer: BC Managed Care – PPO | Admitting: Pharmacist

## 2014-04-08 VITALS — BP 116/68 | HR 78 | Ht 73.0 in | Wt 300.2 lb

## 2014-04-08 DIAGNOSIS — E032 Hypothyroidism due to medicaments and other exogenous substances: Secondary | ICD-10-CM

## 2014-04-08 DIAGNOSIS — I749 Embolism and thrombosis of unspecified artery: Secondary | ICD-10-CM

## 2014-04-08 DIAGNOSIS — I8290 Acute embolism and thrombosis of unspecified vein: Secondary | ICD-10-CM

## 2014-04-08 DIAGNOSIS — E0821 Diabetes mellitus due to underlying condition with diabetic nephropathy: Secondary | ICD-10-CM

## 2014-04-08 DIAGNOSIS — N058 Unspecified nephritic syndrome with other morphologic changes: Secondary | ICD-10-CM

## 2014-04-08 DIAGNOSIS — E1329 Other specified diabetes mellitus with other diabetic kidney complication: Secondary | ICD-10-CM

## 2014-04-08 DIAGNOSIS — E038 Other specified hypothyroidism: Secondary | ICD-10-CM

## 2014-04-08 DIAGNOSIS — R7989 Other specified abnormal findings of blood chemistry: Secondary | ICD-10-CM

## 2014-04-08 DIAGNOSIS — R945 Abnormal results of liver function studies: Secondary | ICD-10-CM

## 2014-04-08 DIAGNOSIS — T50904A Poisoning by unspecified drugs, medicaments and biological substances, undetermined, initial encounter: Secondary | ICD-10-CM

## 2014-04-08 DIAGNOSIS — E669 Obesity, unspecified: Secondary | ICD-10-CM

## 2014-04-08 LAB — POCT INR: INR: 2.9

## 2014-04-08 NOTE — Patient Instructions (Signed)
Increase Lantus to 55 units twice a day Continue Humalog 18 units with evening meal  Increase checking blood glucose to 2 to 3 times a day at varying - sometimes after meal and sometimes before meals. If you have more than 2 readings over 200 or less than 70 call office for medication adjustment. [name] - Please call [phone]) or send a message through My Chart.      Diabetes and Standards of Medical Care  Diabetes is complicated. You may find that your diabetes team includes a dietitian, nurse, diabetes educator, eye doctor, and more. To help everyone know what is going on and to help you get the care you deserve, the following schedule of care was developed to help keep you on track. Below are the tests, exams, vaccines, medicines, education, and plans you will need.  Blood Glucose Goals Prior to meals = 80 - 130 Within 2 hours of the start of a meal = less than 180  HbA1c test (goal is less than 7.0% - your last value was 9.2%) This test shows how well you have controlled your glucose over the past 2 3 months. It is used to see if your diabetes management plan needs to be adjusted.   It is performed at least 2 times a year if you are meeting treatment goals.  It is performed 4 times a year if therapy has changed or if you are not meeting treatment goals.   Blood pressure test (Your last Blood Pressure reading was 116/68)  This test is performed at every routine medical visit. The goal is less than 140/90 mmHg for most people, but 130/80 mmHg in some cases. Ask your health care provider about your goal.  Dental exam  Follow up with the dentist regularly.  Eye exam  If you are diagnosed with type 1 diabetes as a child, get an exam upon reaching the age of 10 years or older and have had diabetes for 3 5 years. Yearly eye exams are recommended after that initial eye exam.  If you are diagnosed with type 1 diabetes as an adult, get an exam within 5 years of diagnosis  and then yearly.  If you are diagnosed with type 2 diabetes, get an exam as soon as possible after the diagnosis and then yearly.  Foot care exam  Visual foot exams are performed at every routine medical visit. The exams check for cuts, injuries, or other problems with the feet.  A comprehensive foot exam should be done yearly. This includes visual inspection as well as assessing foot pulses and testing for loss of sensation.  Check your feet nightly for cuts, injuries, or other problems with your feet. Tell your health care provider if anything is not healing.  Kidney function test (urine microalbumin - last was normal on 02/28/2014)  This test is performed once a year.  Type 1 diabetes: The first test is performed 5 years after diagnosis.  Type 2 diabetes: The first test is performed at the time of diagnosis.  A serum creatinine and estimated glomerular filtration rate (eGFR) test is done once a year to assess the level of chronic kidney disease (CKD), if present.  Lipid profile (cholesterol, HDL, LDL, triglycerides)  Performed every 5 years for most people.  The goal for LDL is less than 100 mg/dL. If you are at high risk, the goal is less than 70 mg/dL. (Your goal is less than 70, your last LCL was 100)  The goal for HDL is  40 mg/dL 50 mg/dL for men and 50 mg/dL 60 mg/dL for women. An HDL cholesterol of 60 mg/dL or higher gives some protection against heart disease. (Your last HDL was 43)  The goal for triglycerides is less than 150 mg/dL. (Your last reading was 171)  Influenza vaccine, pneumococcal vaccine, and hepatitis B vaccine  The influenza vaccine is recommended yearly.  The pneumococcal vaccine is generally given once in a lifetime. However, there are some instances when another vaccination is recommended. Check with your health care provider.  The hepatitis B vaccine is also recommended for adults with diabetes.

## 2014-04-08 NOTE — Progress Notes (Signed)
Diabetes Follow-Up Visit Chief Complaint:   Chief Complaint  Patient presents with  . Atrial Fibrillation  . Diabetes     Filed Vitals:   04/08/14 1247  BP: 116/68  Pulse: 78   Filed Weights   04/08/14 1247  Weight: 300 lb 4 oz (136.193 kg)     HPI:   Type 2 DM with insulin use:  Patient here for follow up INR/Protime and diabetes.  Current meds for DM include - lantus 50 units bid, Humalog 18 units with largest meal / supper, Januvia 24m daily  Insulin regimen was last adjusted 03/14/2014.    Patient is unable to take metformin due to elevated Scr.   Home BG Monitoring:  Checking 1or 2 time a day. Average:  UTD - did not bring in HBG records Per patient - High: 180's and Low:  87   Hypothyroidism - last thyroid panel from 02/2014 showed elevated TSH.  Levothyroxine was increased by 12.569m daily.  Patient is currently taking levothyroxine 15055mdaily and 79m74m/2 tablet daily.  Obesity:   Patient in last 2 months has been trying to limit high calorie foods and CHO's.  He has lost about 3.75# since 6 weeks ago.  Hyperlipidemia - patient is currently taking Crestor 10mg35mly and Welchol 679mg 41mblets daily (started 02/2014)  Polyuria:  denies  Polydipsia:  denies Polyphagia:  denies  BMI:  Body mass index is 39.62 kg/(m^2).    General Appearance:  obese Mood/Affect:  normal  Low fat/carbohydrate diet?  No - but has tried to increase non-starchy vegetables recently and limit CHO intake Nicotine Abuse?  No Medication Compliance?  Yes Exercise?  No Alcohol Abuse?  Yes   Lab Results  Component Value Date   HGBA1C 9.2% 02/28/2014    Previous A1C was 10.9% (01/2013) and 12.3% (09/2012)   Microalbumin was normal (02/28/14)   Lab Results  Component Value Date   CHOL 163 02/28/2014   HDL 29* 02/28/2014   LDLCALC 100* 02/28/2014   TRIG 171* 02/28/2014      Assessment: 1.  Diabetes.  uncontrolled 2.  Blood Pressure.    controlled 3.  Lipids.  Uncontrolled but  Welchol has recently been added 4.  Obesity - weight trending down 5.  Therapeutic anticiagulation 6.  Hypothyroidism - due to recheck TSH today  Recommendations: 1.  Medication recommendations at this time are as follows:   Increase lantus to 55 units BID  Continue Humalog to 18 units with largest meal  Continue Januvia 50mg d89m f Anticoagulation Dose Instructions as of 04/08/2014     Sun MonDorene Grebed Thu Fri Sat   New Dose 5 mg 2.5 _0  mg    Description       Continue warfarin 5mg 1/218mblet on mondays only and 1 tablet all other days.      2.  Reviewed HBG goals:  Fasting 80-130 and 1-2 hour post prandial <180.  Patient is instructed to check BG 1-2 times per day.    3.  BP goal < 140/80. 4.  LDL goal of < 100, HDL > 40 and TG < 150. Continue crestor 10mg dai50mnd Welchol 679mg 3 pe56my 5.  Eye Exam yearly and Dental Exam every 6 months. 6.  Dietary recommendations:  Continue to limit calorie, carbohydrate and saturated fat intake.  Increase intake of fresh fruits and vegetables 7.  Physical Activity recommendations:  Discussed increasing movement and activity  8.  Return to clinic in 4 wks  Orders Placed This Encounter  Procedures  . CMP14+EGFR  and thyroid panel (Dr Tawanna Sat order)  Time spent counseling patient:  60 minutes   Cherre Robins, PharmD, CPP, CDE

## 2014-04-09 LAB — THYROID PANEL WITH TSH
FREE THYROXINE INDEX: 3.1 (ref 1.2–4.9)
T3 UPTAKE RATIO: 29 % (ref 24–39)
T4 TOTAL: 10.8 ug/dL (ref 4.5–12.0)
TSH: 8.39 u[IU]/mL — ABNORMAL HIGH (ref 0.450–4.500)

## 2014-04-09 LAB — CMP14+EGFR
A/G RATIO: 1.1 (ref 1.1–2.5)
ALT: 74 IU/L — AB (ref 0–44)
AST: 108 IU/L — ABNORMAL HIGH (ref 0–40)
Albumin: 3.4 g/dL — ABNORMAL LOW (ref 3.5–5.5)
Alkaline Phosphatase: 156 IU/L — ABNORMAL HIGH (ref 39–117)
BUN/Creatinine Ratio: 15 (ref 9–20)
BUN: 25 mg/dL — ABNORMAL HIGH (ref 6–24)
CALCIUM: 9.3 mg/dL (ref 8.7–10.2)
CO2: 22 mmol/L (ref 18–29)
Chloride: 104 mmol/L (ref 97–108)
Creatinine, Ser: 1.67 mg/dL — ABNORMAL HIGH (ref 0.76–1.27)
GFR calc Af Amer: 51 mL/min/{1.73_m2} — ABNORMAL LOW (ref >59)
GFR, EST NON AFRICAN AMERICAN: 44 mL/min/{1.73_m2} — AB (ref >59)
Globulin, Total: 3 g/dL (ref 1.5–4.5)
Glucose: 184 mg/dL — ABNORMAL HIGH (ref 65–99)
POTASSIUM: 5.4 mmol/L — AB (ref 3.5–5.2)
SODIUM: 140 mmol/L (ref 134–144)
Total Bilirubin: 0.4 mg/dL (ref 0.0–1.2)
Total Protein: 6.4 g/dL (ref 6.0–8.5)

## 2014-04-10 ENCOUNTER — Telehealth: Payer: Self-pay | Admitting: Family Medicine

## 2014-04-10 MED ORDER — LEVOTHYROXINE SODIUM 175 MCG PO TABS: 175.0000 ug | ORAL_TABLET | Freq: Every day | ORAL | Status: DC

## 2014-04-10 NOTE — Telephone Encounter (Signed)
Message copied by Azalee Course on Wed Apr 10, 2014 11:40 AM ------      Message from: Ernestina Penna      Created: Tue Apr 09, 2014  7:05 AM       The TSH remains elevated at 8.390. This means that he is still not getting enough thyroid medication. Please confirm his current treatment and discussed with me how much more medication we will give him. In discussing his current treatment make sure that he is taking this as directed. Discuss with me after talking with the patient and his wife++++++++      The blood sugar is elevated at 184. The creatinine, the most important kidney function tests is elevated at 1.67. And this is consistent with past readings. The electrolytes are within normal limits except the potassium is slightly elevated.-------the BMP should be repeated in 4 weeks.      3 liver function tests are elevated and these are consistent with past readings.----these are most likely elevated secondary to a fatty liver. ------

## 2014-04-10 NOTE — Telephone Encounter (Signed)
Patient's wife notified of lab results - increase levothyroxine to daily.  They have #15 days of and to take until the dose arrives from [facility].

## 2014-04-16 ENCOUNTER — Encounter: Payer: Self-pay | Admitting: Internal Medicine

## 2014-04-16 ENCOUNTER — Ambulatory Visit (INDEPENDENT_AMBULATORY_CARE_PROVIDER_SITE_OTHER): Payer: BC Managed Care – PPO | Admitting: Internal Medicine

## 2014-04-16 VITALS — BP 136/86 | HR 79 | Ht 73.0 in | Wt 301.0 lb

## 2014-04-16 DIAGNOSIS — I429 Cardiomyopathy, unspecified: Secondary | ICD-10-CM

## 2014-04-16 DIAGNOSIS — I472 Ventricular tachycardia, unspecified: Secondary | ICD-10-CM

## 2014-04-16 DIAGNOSIS — I442 Atrioventricular block, complete: Secondary | ICD-10-CM

## 2014-04-16 DIAGNOSIS — Z95810 Presence of automatic (implantable) cardiac defibrillator: Secondary | ICD-10-CM

## 2014-04-16 DIAGNOSIS — I5023 Acute on chronic systolic (congestive) heart failure: Secondary | ICD-10-CM

## 2014-04-16 DIAGNOSIS — I4729 Other ventricular tachycardia: Secondary | ICD-10-CM

## 2014-04-16 DIAGNOSIS — I509 Heart failure, unspecified: Secondary | ICD-10-CM

## 2014-04-16 DIAGNOSIS — I428 Other cardiomyopathies: Secondary | ICD-10-CM

## 2014-04-16 LAB — MDC_IDC_ENUM_SESS_TYPE_INCLINIC
Battery Remaining Longevity: 64 mo
Battery Voltage: 2.98 V
Brady Statistic AP VP Percent: 99.78 %
Brady Statistic AS VP Percent: 0.1 %
Brady Statistic AS VS Percent: 0 %
Brady Statistic RA Percent Paced: 99.9 %
Brady Statistic RV Percent Paced: 99.58 %
Date Time Interrogation Session: 20150714170248
HighPow Impedance: 228 Ohm
HighPow Impedance: 52 Ohm
HighPow Impedance: 72 Ohm
Lead Channel Impedance Value: 399 Ohm
Lead Channel Impedance Value: 456 Ohm
Lead Channel Pacing Threshold Amplitude: 1.125 V
Lead Channel Pacing Threshold Pulse Width: 0.4 ms
Lead Channel Pacing Threshold Pulse Width: 0.4 ms
Lead Channel Sensing Intrinsic Amplitude: 3.75 mV
Lead Channel Sensing Intrinsic Amplitude: 6 mV
Lead Channel Setting Pacing Amplitude: 2 V
Lead Channel Setting Pacing Amplitude: 2.75 V
Lead Channel Setting Pacing Pulse Width: 0.8 ms
Lead Channel Setting Sensing Sensitivity: 0.3 mV
MDC IDC MSMT LEADCHNL LV IMPEDANCE VALUE: 266 Ohm
MDC IDC MSMT LEADCHNL LV IMPEDANCE VALUE: 513 Ohm
MDC IDC MSMT LEADCHNL LV PACING THRESHOLD AMPLITUDE: 1 V
MDC IDC MSMT LEADCHNL LV PACING THRESHOLD PULSEWIDTH: 0.8 ms
MDC IDC MSMT LEADCHNL RA PACING THRESHOLD AMPLITUDE: 0.625 V
MDC IDC MSMT LEADCHNL RA SENSING INTR AMPL: 1.625 mV
MDC IDC MSMT LEADCHNL RV IMPEDANCE VALUE: 437 Ohm
MDC IDC MSMT LEADCHNL RV SENSING INTR AMPL: 6 mV
MDC IDC SET LEADCHNL LV PACING AMPLITUDE: 2 V
MDC IDC SET LEADCHNL RV PACING PULSEWIDTH: 0.4 ms
MDC IDC SET ZONE DETECTION INTERVAL: 350 ms
MDC IDC SET ZONE DETECTION INTERVAL: 500 ms
MDC IDC STAT BRADY AP VS PERCENT: 0.12 %
Zone Setting Detection Interval: 280 ms
Zone Setting Detection Interval: 350 ms
Zone Setting Detection Interval: 510 ms

## 2014-04-16 NOTE — Progress Notes (Signed)
HPI [name] returns today for followup. He is a very pleasant middle-age man with a hypertrophic cardiomyopathy, chronic systolic heart failure, complete heart block, and ventricular tachycardia. He has been on amiodarone and mexiletine. His ventricular tachycardia has been fairly well-controlled with a one episode of ICD shock in the last 6 months. His heart failure remains class III. The patient denies syncope. He has mild peripheral edema. He is now disabled. He denies chest pain. He has had some insomnia. Allergies  Allergen Reactions  . Ace Inhibitors     AVOID ALL  BECAUSE OF HEART DISEASE   . Nitroglycerin Other (See Comments)    Reaction unknown  . Procan Sr [Procainamide Hcl]     PT. HAS HEART DISEASE CALLED HYPERTHROPHIC CARDIOMYOPATHY  . Quinidine     PT. HAS HEART DISEASE CALLED HYPERTHROPHIC CARDIOMYOPATHY       Current Outpatient Prescriptions  Medication Sig Dispense Refill  . amiodarone (PACERONE) 400 MG tablet Take 0.5-1 tablets (200-400 mg total) by mouth daily. Take 200mg  on Sat and Sun take 400mg  all other days  90 tablet  3  . carvedilol (COREG) 25 MG tablet Take 1 tablet (25 mg total) by mouth 2 (two) times daily with a meal.  180 tablet  2  . cholecalciferol (VITAMIN D) 1000 UNITS tablet Take 2,000 Units by mouth daily.       . fish oil-omega-3 fatty acids 1000 MG capsule Take 3 capsules by mouth daily.       . furosemide (LASIX) 40 MG tablet Take by mouth every morning. TAKE 1 TABLET DAILY      . insulin glargine (LANTUS) 100 UNIT/ML injection Inject 0.55 mLs (55 Units total) into the skin 2 (two) times daily.  90 mL  3  . insulin lispro (HUMALOG KWIKPEN) 100 UNIT/ML KiwkPen Inject 16 units with largest/evening meal.Dispense quikpens  30 mL  11  . levothyroxine (SYNTHROID, LEVOTHROID) 175 MCG tablet Take 1 tablet (175 mcg total) by mouth daily before breakfast.  90 tablet  0  . lisinopril (PRINIVIL,ZESTRIL) 2.5 MG tablet Take 1 tablet (2.5 mg total) by mouth 2  (two) times daily.  180 tablet  3  . mexiletine (MEXITIL) 150 MG capsule TAKE 1 CAPSULE EVERY 8 HOURS  270 capsule  3  . rosuvastatin (CRESTOR) 20 MG tablet Take 0.5 tablets (10 mg total) by mouth daily.  28 tablet  0  . sitaGLIPtin (JANUVIA) 50 MG tablet Take 1 tablet (50 mg total) by mouth daily.  30 tablet  2  . warfarin (COUMADIN) 5 MG tablet Take 1 tablet by mouth daily or as directed by anticoagulation clinic  90 tablet  2  . WELCHOL 625 MG tablet TAKE 1 TABLET THREE TIMES A DAY  270 tablet  3   No current facility-administered medications for this visit.     Past Medical History  Diagnosis Date  . DM   . HYPERLIPIDEMIA-MIXED   . Obesity, unspecified   . HYPERTENSION, UNSPECIFIED   . CAD (coronary artery disease)     a. Nonobstructive moderate CAD by cath - last 2012.  [name] Hypertrophic obstructive cardiomyopathy   . Complete heart block     a. s/p pacemaker later upgraded to CRT-D.  [name] CKD (chronic kidney disease)     a. Baseline Cr 1.4-1.5.  [name] Chronic systolic CHF (congestive heart failure)     a. NICM (EF 25% in 2012, 20-25% in 09/2013). b. s/p BiV-ICD (MDT).  . Colon polyps   . Ventricular tachycardia  a. EPS/RFCA of VT in 2012. b. On amio, mexilitene. c. Last BiV-ICD gen change 05/2013 (MDT).  . Hypothyroidism   . Fatty liver disease, nonalcoholic   . BPH (benign prostatic hyperplasia)   . Habitual alcohol use     ROS:   All systems reviewed and negative except as noted in the HPI.   Past Surgical History  Procedure Laterality Date  . Pacemaker insertion      medtronic  . Cardiac catheterization    . Facial reconstruction surgery      after trauma in 2006     Family History  Problem Relation Age of Onset  . Diabetic kidney disease Father   . Coronary artery disease Neg Hx      History   Social History  . Marital Status: Married    Spouse Name: N/A    Number of Children: N/A  . Years of Education: N/A   Occupational History  . Not on file.    Social History Main Topics  . Smoking status: Former Smoker    Types: Cigarettes    Quit date: 10/04/1968  . Smokeless tobacco: Never Used     Comment: 30+ yrs  . Alcohol Use: Yes     Comment: about 1 cup of liquor daily (straight liquor - not mixed)  . Drug Use: No  . Sexual Activity: Not on file   Other Topics Concern  . Not on file   Social History Narrative  . No narrative on file     BP 136/86  Pulse 79  Ht 6\' 1"  (1.854 m)  Wt 301 lb (136.533 kg)  BMI 39.72 kg/m2  Physical Exam:  stable appearing middle-aged man,NAD HEENT: Unremarkable Neck:  7 cm JVD, no thyromegally Back:  No CVA tenderness Lungs:  Clear with no wheezes, rales, or rhonchi. HEART:  Regular rate rhythm, no murmurs, no rubs, no clicks Abd:  soft, positive bowel sounds, no organomegally, no rebound, no guarding Ext:  2 plus pulses, no edema, no cyanosis, no clubbing Skin:  No rashes no nodules Neuro:  CN II through XII intact, motor grossly intact  EKG - normal sinus rhythm with AV sequential biventricular pacing  DEVICE  Normal device function.  See PaceArt for details. Device at elective replacement  Assess/Plan:

## 2014-04-16 NOTE — Assessment & Plan Note (Signed)
His medtronic BiV ICD is working normally. Will recheck in several months.  

## 2014-04-16 NOTE — Assessment & Plan Note (Signed)
His symptoms appear to be improved and he has no edema. He is trying to do better with his diet. He will continue his current meds.

## 2014-04-16 NOTE — Patient Instructions (Signed)

## 2014-04-16 NOTE — Assessment & Plan Note (Signed)
He will continue amio and mextil. No change in therapy. Hopefully his VT will remain quiet.

## 2014-04-18 ENCOUNTER — Telehealth: Payer: Self-pay | Admitting: *Deleted

## 2014-04-25 ENCOUNTER — Encounter: Payer: Self-pay | Admitting: *Deleted

## 2014-05-09 ENCOUNTER — Ambulatory Visit: Payer: Self-pay

## 2014-05-10 ENCOUNTER — Other Ambulatory Visit: Payer: Self-pay

## 2014-05-10 MED ORDER — WARFARIN SODIUM 5 MG PO TABS: ORAL_TABLET | ORAL | Status: DC

## 2014-05-23 ENCOUNTER — Other Ambulatory Visit: Payer: Self-pay | Admitting: Family Medicine

## 2014-06-07 ENCOUNTER — Telehealth: Payer: Self-pay | Admitting: Pharmacist

## 2014-06-07 NOTE — Telephone Encounter (Signed)
appt made to follow up DM and protime for 06/14/2014 - patient missed appt in August

## 2014-06-14 ENCOUNTER — Encounter: Payer: Self-pay | Admitting: Nurse Practitioner

## 2014-06-14 ENCOUNTER — Ambulatory Visit (INDEPENDENT_AMBULATORY_CARE_PROVIDER_SITE_OTHER): Payer: BC Managed Care – PPO | Admitting: Nurse Practitioner

## 2014-06-14 ENCOUNTER — Ambulatory Visit (INDEPENDENT_AMBULATORY_CARE_PROVIDER_SITE_OTHER): Payer: BC Managed Care – PPO

## 2014-06-14 VITALS — BP 113/68 | HR 81 | Temp 97.8°F | Ht 73.0 in | Wt 312.4 lb

## 2014-06-14 DIAGNOSIS — I749 Embolism and thrombosis of unspecified artery: Secondary | ICD-10-CM

## 2014-06-14 DIAGNOSIS — E119 Type 2 diabetes mellitus without complications: Secondary | ICD-10-CM

## 2014-06-14 DIAGNOSIS — R059 Cough, unspecified: Secondary | ICD-10-CM

## 2014-06-14 DIAGNOSIS — R05 Cough: Secondary | ICD-10-CM

## 2014-06-14 DIAGNOSIS — J209 Acute bronchitis, unspecified: Secondary | ICD-10-CM

## 2014-06-14 LAB — POCT INR: INR: 4.1

## 2014-06-14 LAB — POCT GLYCOSYLATED HEMOGLOBIN (HGB A1C): Hemoglobin A1C: 7.4

## 2014-06-14 MED ORDER — AMOXICILLIN 875 MG PO TABS: 875.0000 mg | ORAL_TABLET | Freq: Two times a day (BID) | ORAL | Status: DC

## 2014-06-14 NOTE — Patient Instructions (Signed)
Anticoagulation Dose Instructions as of 06/14/2014     [name] Tue Wed Thu Fri Sat   New Dose 5 mg 2.5 mg 5 mg 5 mg 5 mg 2.5 mg 5 mg    Description       No warfarin today - 06/14/14.  Take 1/2 tablet tomorrow - 06/15/14.  Then decrease dose to warfarin 5mg  1/2 tablet on mondays and fridays and 1 tablet all other days.      INR was 4.1 today

## 2014-06-14 NOTE — Progress Notes (Signed)
   Subjective:    Patient ID: Dakota Brook Sr., male    DOB: [birth year], 58 y.o.   MRN: [id number]  HPI Patient in c/o nasal congestion and headache- started about 1 week ago- has been using saline nasal spray and OTC cough meds- no better. Wheezing.    Review of Systems  Constitutional: Positive for fever. Negative for chills.  HENT: Positive for congestion, rhinorrhea and sinus pressure. Negative for sore throat and trouble swallowing.   Respiratory: Positive for cough (productive).   Cardiovascular: Negative.   Musculoskeletal: Negative.   Neurological: Negative.   Psychiatric/Behavioral: Negative.   All other systems reviewed and are negative.      Objective:   Physical Exam  Constitutional: He is oriented to person, place, and time. He appears well-developed and well-nourished.  HENT:  Right Ear: Hearing, tympanic membrane, external ear and ear canal normal.  Left Ear: Hearing, tympanic membrane, external ear and ear canal normal.  Nose: Mucosal edema and rhinorrhea present. Right sinus exhibits no maxillary sinus tenderness and no frontal sinus tenderness. Left sinus exhibits no maxillary sinus tenderness and no frontal sinus tenderness.  Mouth/Throat: Uvula is midline, oropharynx is clear and moist and mucous membranes are normal.  Eyes: Pupils are equal, round, and reactive to light.  Neck: Normal range of motion. Neck supple.  Cardiovascular: Normal rate, regular rhythm and normal heart sounds.   Pulmonary/Chest: Effort normal. He has wheezes (faint bil bases).  Deep cough  Neurological: He is alert and oriented to person, place, and time.  Skin: Skin is warm and dry.  Psychiatric: He has a normal mood and affect. His behavior is normal. Judgment and thought content normal.  BP 113/68  Pulse 81  Temp(Src) 97.8 F (36.6 C) (Oral)  Ht 6\' 1"  (1.854 m)  Wt 312 lb 6.4 oz (141.704 kg)  BMI 41.23 kg/m2   Chest x ray- bronchitic changes with cardiomegaly-Preliminary  reading by Paulene Floor, FNP  [facility]       Assessment & Plan:   1. Cough   2. Acute bronchitis, unspecified organism    Meds ordered this encounter  Medications  . amoxicillin (AMOXIL) 875 MG tablet    Sig: Take 1 tablet (875 mg total) by mouth 2 (two) times daily.    Dispense:  20 tablet    Refill:  0    Order Specific Question:  Supervising Provider    Answer:  Ernestina Penna [[id number]]   1. Take meds as prescribed 2. Use a cool mist humidifier especially during the winter months and when heat has been humid. 3. Use saline nose sprays frequently 4. Saline irrigations of the nose can be very helpful if done frequently.  * 4X daily for 1 week*  * Use of a nettie pot can be helpful with this. Follow directions with this* 5. Drink plenty of fluids 6. Keep thermostat turn down low 7.For any cough or congestion  Use plain Mucinex- regular strength or max strength is fine   * Children- consult with Pharmacist for dosing 8. For fever or aces or pains- take tylenol or ibuprofen appropriate for age and weight.  * for fevers greater than 101 orally you may alternate ibuprofen and tylenol every  3 hours.   Mary-Margaret Daphine Deutscher, FNP

## 2014-06-14 NOTE — Progress Notes (Signed)
Patient ID: Dakota Brook Sr., male   DOB: April 04, 1956, 58 y.o.   MRN: [id number]  Patient came in today to follow up diabetes and have INR rechecked.  He c/o cough, nasal congestion and wheezing.  Triaged to Bennie Pierini, NP for work up.

## 2014-06-14 NOTE — Patient Instructions (Signed)

## 2014-06-15 LAB — THYROID PANEL WITH TSH
FREE THYROXINE INDEX: 3.5 (ref 1.2–4.9)
T3 Uptake Ratio: 30 % (ref 24–39)
T4, Total: 11.8 ug/dL (ref 4.5–12.0)
TSH: 5.34 u[IU]/mL — ABNORMAL HIGH (ref 0.450–4.500)

## 2014-06-15 LAB — BMP8+EGFR
BUN / CREAT RATIO: 12 (ref 9–20)
BUN: 18 mg/dL (ref 6–24)
CO2: 24 mmol/L (ref 18–29)
Calcium: 8.7 mg/dL (ref 8.7–10.2)
Chloride: 101 mmol/L (ref 97–108)
Creatinine, Ser: 1.48 mg/dL — ABNORMAL HIGH (ref 0.76–1.27)
GFR, EST AFRICAN AMERICAN: 59 mL/min/{1.73_m2} — AB (ref >59)
GFR, EST NON AFRICAN AMERICAN: 51 mL/min/{1.73_m2} — AB (ref >59)
Glucose: 190 mg/dL — ABNORMAL HIGH (ref 65–99)
Potassium: 5.1 mmol/L (ref 3.5–5.2)
Sodium: 139 mmol/L (ref 134–144)

## 2014-06-16 ENCOUNTER — Other Ambulatory Visit: Payer: Self-pay | Admitting: Family Medicine

## 2014-06-17 ENCOUNTER — Telehealth: Payer: Self-pay | Admitting: Pharmacist

## 2014-06-17 NOTE — Telephone Encounter (Signed)
Called to discuss lab results and lovenox bridging prior to colonoscopy.  Patient nor wife was available will call back.

## 2014-06-19 MED ORDER — WARFARIN SODIUM 5 MG PO TABS: ORAL_TABLET | ORAL | Status: DC

## 2014-06-19 MED ORDER — ENOXAPARIN SODIUM 120 MG/0.8ML ~~LOC~~ SOLN: 120.0000 mg | Freq: Two times a day (BID) | SUBCUTANEOUS | Status: DC

## 2014-06-19 MED ORDER — LEVOTHYROXINE SODIUM 75 MCG PO TABS: ORAL_TABLET | ORAL | Status: DC

## 2014-06-19 NOTE — Telephone Encounter (Signed)
His recent thyroid labs have not been reviewed and dosage may change

## 2014-06-19 NOTE — Telephone Encounter (Signed)
Spoke with patient's wife about increasing levothyroxine dose currently taking daily and TSH was still elevated - dose increase needed.   She states that she has over 3 months supple of and would like to use.   Instructed to take 1 tablet of and 1/2 tablet of (sent new rx in) for a total of 187.5 mcg daily.   I also recommended lovenox bridging therapy prior to colonoscopy.  See below.  Copy given to patient.  Warfarin-Lovenox Bridging Plan for [name] (DOB: 1956-04-02) Dx:  History of thrombus, ventricular tachycardia and CHF  Friday, September 18th Last dose of warfarin   Saturday, September 19th No warfarin   Sunday September 20th No warfarin Begin Lovenox/enoxaparin 120mg  each morning and each evening  Monday, September 21st No warfarin Lovenox/enoxaparin 120mg  each morning and each evening  Tuesday, September 22nd No warfarin Lovenox/enoxaparin 120mg  each morning and each evening  Wednesday, September 23rd No warfarin Lovenox/enoxaparin 120mg  in the morning only   Thursday, September 25th Day of procedure No warfarin No Lovenox/enoxaparin  Friday, September 26th Restart warfarin take 7.5 mg (= 1 & 1/2 tablets) today Restart Lovenox/enoxaparin 120mg  daily in evening   Saturday, September 27th Take warfarin 7.5mg   (= 1 and 1/2 tablets) Lovenox/enoxaparin 120mg  each morning and each evening  Sunday, September 28th Take warfarin 5mg  (= 1 tablet) Loevnox 120mg  injected in the morning only  Monday, September 29th Come in for protime - will get instructions at that time. Appt for Monday, September 29th at 3:00pm

## 2014-06-19 NOTE — Telephone Encounter (Signed)
Please confirm that the patient is taking his thyroid medicine regularly, also confirm the current dose, since the TSH is only slightly elevated I would continue the current medication and repeat his thyroid profile in 4 weeks. Make sure that he is taking the medicine regularly and confirm the current dose

## 2014-07-04 ENCOUNTER — Ambulatory Visit (INDEPENDENT_AMBULATORY_CARE_PROVIDER_SITE_OTHER): Payer: BC Managed Care – PPO | Admitting: Family Medicine

## 2014-07-04 ENCOUNTER — Encounter: Payer: Self-pay | Admitting: Family Medicine

## 2014-07-04 VITALS — BP 123/79 | HR 82 | Temp 98.3°F | Ht 73.0 in | Wt 309.0 lb

## 2014-07-04 DIAGNOSIS — I10 Essential (primary) hypertension: Secondary | ICD-10-CM

## 2014-07-04 DIAGNOSIS — E119 Type 2 diabetes mellitus without complications: Secondary | ICD-10-CM

## 2014-07-04 DIAGNOSIS — Z23 Encounter for immunization: Secondary | ICD-10-CM

## 2014-07-04 DIAGNOSIS — I429 Cardiomyopathy, unspecified: Secondary | ICD-10-CM

## 2014-07-04 DIAGNOSIS — E039 Hypothyroidism, unspecified: Secondary | ICD-10-CM

## 2014-07-04 DIAGNOSIS — E559 Vitamin D deficiency, unspecified: Secondary | ICD-10-CM

## 2014-07-04 DIAGNOSIS — E785 Hyperlipidemia, unspecified: Secondary | ICD-10-CM

## 2014-07-04 DIAGNOSIS — I8290 Acute embolism and thrombosis of unspecified vein: Secondary | ICD-10-CM

## 2014-07-04 LAB — POCT CBC
Granulocyte percent: 74.5 %G (ref 37–80)
HCT, POC: 37.6 % — AB (ref 43.5–53.7)
Hemoglobin: 11.9 g/dL — AB (ref 14.1–18.1)
LYMPH, POC: 1.3 (ref 0.6–3.4)
MCH: 26.6 pg — AB (ref 27–31.2)
MCHC: 31.6 g/dL — AB (ref 31.8–35.4)
MCV: 84.1 fL (ref 80–97)
MPV: 8.4 fL (ref 0–99.8)
PLATELET COUNT, POC: 89 10*3/uL — AB (ref 142–424)
POC Granulocyte: 4.6 (ref 2–6.9)
POC LYMPH %: 21 % (ref 10–50)
RBC: 4.5 M/uL — AB (ref 4.69–6.13)
RDW, POC: 16.1 %
WBC: 6.2 10*3/uL (ref 4.6–10.2)

## 2014-07-04 LAB — POCT INR: INR: 2.3

## 2014-07-04 LAB — POCT GLYCOSYLATED HEMOGLOBIN (HGB A1C): Hemoglobin A1C: 6.8

## 2014-07-04 NOTE — Progress Notes (Signed)
Subjective:    Patient ID: Dakota Royal Sr., male    DOB: Apr 11, 1956, 58 y.o.   MRN: [id number]  HPI Pt here for follow up and management of chronic medical problems. The patient is doing well. As usual he has no complaints. He has had a recent colonoscopy. He will get lab work today and his flu shot. The patient's recent colonoscopy was done by Dr. Nigel Mormon. He did have polyps and it was necessary for them to recheck another colonoscopy in 3 years         Patient Active Problem List   Diagnosis Date Noted  . High risk medication use 10/29/2013  . Thrombus 10/08/2013  . Acute respiratory failure 10/01/2013  . Sepsis 09/29/2013  . Acute renal failure 09/29/2013  . Cardiomyopathy 09/29/2013  . Diabetes mellitus 09/29/2013  . Metabolic acidosis 56/31/4970  . Hyperkalemia 09/29/2013  . Hypothyroidism (acquired) 04/09/2013  . Ventricular tachycardia 08/12/2011  . Acute on chronic systolic CHF (congestive heart failure) 01/27/2011  . OBESITY, UNSPECIFIED 01/07/2010  . RENAL INSUFFICIENCY 01/07/2010  . AUTOMATIC IMPLANTABLE CARDIAC DEFIBRILLATOR SITU 06/10/2009  . Type 2 diabetes mellitus treated with insulin 01/16/2009  . Hyperlipidemia 01/16/2009  . HTN (hypertension) 01/16/2009  . MYOCARDIAL INFARCTION 01/16/2009  . Hypertrophic obstructive cardiomyopathy(425.11) 01/16/2009  . CARDIOMYOPATHY, SECONDARY 01/16/2009  . AV BLOCK, COMPLETE 01/16/2009  . BRADYCARDIA 01/16/2009   Outpatient Encounter Prescriptions as of 07/04/2014  Medication Sig  . amiodarone (PACERONE) 400 MG tablet Take 0.5-1 tablets (200-400 mg total) by mouth daily. Take $RemoveBef'200mg'DnLvBXnBkL$  on Sat and Sun take $Remove'400mg'qSbmRnO$  all other days  . carvedilol (COREG) 25 MG tablet Take 1 tablet (25 mg total) by mouth 2 (two) times daily with a meal.  . cholecalciferol (VITAMIN D) 1000 UNITS tablet Take 2,000 Units by mouth daily.   . fish oil-omega-3 fatty acids 1000 MG capsule Take 3 capsules by mouth daily.   . furosemide (LASIX) 40  MG tablet Take by mouth every morning. TAKE 1 TABLET DAILY  . insulin glargine (LANTUS) 100 UNIT/ML injection Inject 0.55 mLs (55 Units total) into the skin 2 (two) times daily.  . insulin lispro (HUMALOG KWIKPEN) 100 UNIT/ML KiwkPen Inject 16 units with largest/evening meal.Dispense quikpens  . levothyroxine (SYNTHROID, LEVOTHROID) 175 MCG tablet TAKE 1 TABLET DAILY BEFORE BREAKFAST  . lisinopril (PRINIVIL,ZESTRIL) 2.5 MG tablet TAKE 1 TABLET TWICE A DAY  . mexiletine (MEXITIL) 150 MG capsule TAKE 1 CAPSULE EVERY 8 HOURS  . rosuvastatin (CRESTOR) 20 MG tablet Take 0.5 tablets (10 mg total) by mouth daily.  . sitaGLIPtin (JANUVIA) 50 MG tablet Take 1 tablet (50 mg total) by mouth daily.  [name] warfarin (COUMADIN) 5 MG tablet Take 1 tablet by mouth daily or as directed by anticoagulation clinic  . WELCHOL 625 MG tablet TAKE 1 TABLET THREE TIMES A DAY  . [DISCONTINUED] amoxicillin (AMOXIL) 875 MG tablet Take 1 tablet (875 mg total) by mouth 2 (two) times daily.  . [DISCONTINUED] enoxaparin (LOVENOX) 120 MG/0.8ML injection Inject 0.8 mLs (120 mg total) into the skin every 12 (twelve) hours.    Review of Systems  Constitutional: Negative.   HENT: Negative.   Eyes: Negative.   Respiratory: Negative.   Cardiovascular: Negative.   Gastrointestinal: Negative.   Endocrine: Negative.   Genitourinary: Negative.   Musculoskeletal: Negative.   Skin: Negative.   Allergic/Immunologic: Negative.   Neurological: Negative.   Hematological: Negative.   Psychiatric/Behavioral: Negative.        Objective:   Physical Exam  Nursing note and vitals reviewed. Constitutional: He is oriented to person, place, and time. He appears well-developed and well-nourished. No distress.  HENT:  Head: Normocephalic and atraumatic.  Right Ear: External ear normal.  Left Ear: External ear normal.  Nose: Nose normal.  Mouth/Throat: Oropharynx is clear and moist. No oropharyngeal exudate.  Eyes: Conjunctivae and EOM are  normal. Pupils are equal, round, and reactive to light. Right eye exhibits no discharge. Left eye exhibits no discharge. No scleral icterus.  Neck: Normal range of motion. Neck supple. No thyromegaly present.  Cardiovascular: Normal rate, regular rhythm, normal heart sounds and intact distal pulses.  Exam reveals no gallop and no friction rub.   No murmur heard. At 72 per minute  Pulmonary/Chest: Effort normal. No respiratory distress. He has wheezes. He has no rales. He exhibits no tenderness.  Rare wheeze  Abdominal: Soft. Bowel sounds are normal. He exhibits no mass. There is no tenderness. There is no rebound and no guarding.  Obesity without organ enlargement or tenderness  Musculoskeletal: Normal range of motion. He exhibits no edema and no tenderness.  Lymphadenopathy:    He has no cervical adenopathy.  Neurological: He is alert and oriented to person, place, and time. He has normal reflexes. No cranial nerve deficit.  Decreased but equal lower extremity 3 foot  Skin: Skin is warm and dry. No rash noted. No erythema. No pallor.  Psychiatric: He has a normal mood and affect. His behavior is normal. Judgment and thought content normal.   BP 123/79  Pulse 82  Temp(Src) 98.3 F (36.8 C) (Oral)  Ht $R'6\' 1"'Co$  (1.854 m)  Wt 309 lb (140.161 kg)  BMI 40.78 kg/m2        Assessment & Plan:  1. Type 2 diabetes mellitus without complication - POCT CBC - POCT glycosylated hemoglobin (Hb A1C)  2. Essential hypertension - POCT CBC - BMP8+EGFR - Hepatic function panel  3. Hypothyroidism (acquired) - POCT CBC  4. Hyperlipidemia - POCT CBC - NMR, lipoprofile  5. Vitamin D deficiency - Vit D  25 hydroxy (rtn osteoporosis monitoring)  6. Thrombus - POCT INR  7. Cardiomyopathy  Patient Instructions  Continue current medications. Continue good therapeutic lifestyle changes which include good diet and exercise. Fall precautions discussed with patient. If an FOBT was given today-  please return it to our front desk. If you are over 71 years old - you may need Prevnar 77 or the adult Pneumonia vaccine.  Flu Shots will be available at our office starting mid- September. Please call and schedule a FLU CLINIC APPOINTMENT.   Continue to watch diet and get as much exercise as possible Check blood sugars and bring readings to the office at her next visit   Arrie Senate MD

## 2014-07-04 NOTE — Patient Instructions (Addendum)
Continue current medications. Continue good therapeutic lifestyle changes which include good diet and exercise. Fall precautions discussed with patient. If an FOBT was given today- please return it to our front desk. If you are over 58 years old - you may need Prevnar 13 or the adult Pneumonia vaccine.  Flu Shots will be available at our office starting mid- September. Please call and schedule a FLU CLINIC APPOINTMENT.   Continue to watch diet and get as much exercise as possible Check blood sugars and bring readings to the office at her next visit   Anticoagulation Dose Instructions as of 07/04/2014     [name] Tue Wed Thu Fri Sat   New Dose 5 mg 2.5 mg 5 mg 5 mg 5 mg 2.5 mg 5 mg    Description       Continue current dose of warfarin 5mg  1/2 tablet on mondays and fridays and 1 tablet all other days.      INR was 2.3 today

## 2014-07-05 LAB — HEPATIC FUNCTION PANEL
ALT: 52 IU/L — AB (ref 0–44)
AST: 82 IU/L — AB (ref 0–40)
Albumin: 2.9 g/dL — ABNORMAL LOW (ref 3.5–5.5)
Alkaline Phosphatase: 151 IU/L — ABNORMAL HIGH (ref 39–117)
Bilirubin, Direct: 0.13 mg/dL (ref 0.00–0.40)
Total Bilirubin: 0.4 mg/dL (ref 0.0–1.2)
Total Protein: 6.3 g/dL (ref 6.0–8.5)

## 2014-07-05 LAB — NMR, LIPOPROFILE
Cholesterol: 127 mg/dL (ref 100–199)
HDL CHOLESTEROL BY NMR: 24 mg/dL — AB (ref >39)
HDL Particle Number: 13.5 umol/L — ABNORMAL LOW (ref >=30.5)
LDL Particle Number: 1069 nmol/L — ABNORMAL HIGH (ref <1000)
LDL Size: 21.7 nm (ref >20.5)
LDLC SERPL CALC-MCNC: 73 mg/dL (ref 0–99)
LP-IR Score: 26 (ref <=45)
Small LDL Particle Number: 443 nmol/L (ref <=527)
Triglycerides by NMR: 150 mg/dL — ABNORMAL HIGH (ref 0–149)

## 2014-07-05 LAB — BMP8+EGFR
BUN/Creatinine Ratio: 9 (ref 9–20)
BUN: 15 mg/dL (ref 6–24)
CHLORIDE: 106 mmol/L (ref 97–108)
CO2: 27 mmol/L (ref 18–29)
Calcium: 8.7 mg/dL (ref 8.7–10.2)
Creatinine, Ser: 1.64 mg/dL — ABNORMAL HIGH (ref 0.76–1.27)
GFR calc Af Amer: 53 mL/min/{1.73_m2} — ABNORMAL LOW (ref >59)
GFR calc non Af Amer: 45 mL/min/{1.73_m2} — ABNORMAL LOW (ref >59)
Glucose: 141 mg/dL — ABNORMAL HIGH (ref 65–99)
Potassium: 4.8 mmol/L (ref 3.5–5.2)
Sodium: 146 mmol/L — ABNORMAL HIGH (ref 134–144)

## 2014-07-05 LAB — VITAMIN D 25 HYDROXY (VIT D DEFICIENCY, FRACTURES): VIT D 25 HYDROXY: 46.6 ng/mL (ref 30.0–100.0)

## 2014-07-16 ENCOUNTER — Ambulatory Visit (INDEPENDENT_AMBULATORY_CARE_PROVIDER_SITE_OTHER): Payer: BC Managed Care – PPO | Admitting: *Deleted

## 2014-07-16 ENCOUNTER — Telehealth: Payer: Self-pay | Admitting: Cardiology

## 2014-07-16 DIAGNOSIS — I5022 Chronic systolic (congestive) heart failure: Secondary | ICD-10-CM

## 2014-07-16 DIAGNOSIS — I472 Ventricular tachycardia, unspecified: Secondary | ICD-10-CM

## 2014-07-16 NOTE — Telephone Encounter (Signed)
Attempted to confirm appt with pt. No answer and unable to leave a message.

## 2014-07-17 NOTE — Progress Notes (Signed)
Remote ICD transmission.   

## 2014-07-21 LAB — MDC_IDC_ENUM_SESS_TYPE_REMOTE
Brady Statistic AP VP Percent: 99.8 %
Brady Statistic AP VS Percent: 0.12 %
Brady Statistic AS VP Percent: 0.09 %
Brady Statistic AS VS Percent: 0 %
Brady Statistic RA Percent Paced: 99.91 %
Brady Statistic RV Percent Paced: 99.67 %
Date Time Interrogation Session: 20151013222614
HIGH POWER IMPEDANCE MEASURED VALUE: 63 Ohm
HighPow Impedance: 209 Ohm
HighPow Impedance: 47 Ohm
Lead Channel Impedance Value: 228 Ohm
Lead Channel Impedance Value: 380 Ohm
Lead Channel Impedance Value: 380 Ohm
Lead Channel Impedance Value: 437 Ohm
Lead Channel Impedance Value: 494 Ohm
Lead Channel Pacing Threshold Amplitude: 0.625 V
Lead Channel Pacing Threshold Amplitude: 0.75 V
Lead Channel Pacing Threshold Pulse Width: 0.4 ms
Lead Channel Sensing Intrinsic Amplitude: 1.5 mV
Lead Channel Sensing Intrinsic Amplitude: 6 mV
Lead Channel Setting Pacing Amplitude: 1.75 V
Lead Channel Setting Pacing Amplitude: 2 V
Lead Channel Setting Pacing Pulse Width: 0.4 ms
Lead Channel Setting Sensing Sensitivity: 0.3 mV
MDC IDC MSMT BATTERY REMAINING LONGEVITY: 59 mo
MDC IDC MSMT BATTERY VOLTAGE: 2.98 V
MDC IDC MSMT LEADCHNL LV PACING THRESHOLD PULSEWIDTH: 0.8 ms
MDC IDC MSMT LEADCHNL RV PACING THRESHOLD AMPLITUDE: 1.5 V
MDC IDC MSMT LEADCHNL RV PACING THRESHOLD PULSEWIDTH: 0.4 ms
MDC IDC SET LEADCHNL LV PACING PULSEWIDTH: 0.8 ms
MDC IDC SET LEADCHNL RV PACING AMPLITUDE: 3 V
MDC IDC SET ZONE DETECTION INTERVAL: 350 ms
MDC IDC SET ZONE DETECTION INTERVAL: 350 ms
MDC IDC SET ZONE DETECTION INTERVAL: 510 ms
Zone Setting Detection Interval: 280 ms
Zone Setting Detection Interval: 500 ms

## 2014-07-25 ENCOUNTER — Ambulatory Visit (INDEPENDENT_AMBULATORY_CARE_PROVIDER_SITE_OTHER): Payer: BC Managed Care – PPO | Admitting: Pharmacist

## 2014-07-25 DIAGNOSIS — E0821 Diabetes mellitus due to underlying condition with diabetic nephropathy: Secondary | ICD-10-CM

## 2014-07-25 DIAGNOSIS — I8290 Acute embolism and thrombosis of unspecified vein: Secondary | ICD-10-CM

## 2014-07-25 DIAGNOSIS — E039 Hypothyroidism, unspecified: Secondary | ICD-10-CM

## 2014-07-25 LAB — POCT INR: INR: 3.7

## 2014-07-25 NOTE — Patient Instructions (Signed)
Anticoagulation Dose Instructions as of 07/25/2014     [name] Tue Wed Thu Fri Sat   New Dose 5 mg 2.5 mg 5 mg 2.5 mg 5 mg 2.5 mg 5 mg    Description       No warfarin today - Thursday, October 22nd.  Then decrease dose to warfarin 5mg  1/2 tablet on mondays, wednesdays and fridays and 1 tablet all other days.      INR was 3.7 today   Increase lantus to 56units BID  Decrease Humalog to 15 units with largest meal  Continue Januvia 50mg  daily

## 2014-07-25 NOTE — Progress Notes (Signed)
Diabetes Follow-Up Visit  HPI:   Type 2 DM with insulin use:  Patient here for follow up INR/Protime and diabetes.  Current meds for DM include - lantus 55 units bid, Humalog 18 units with largest meal / supper, Januvia 50mg  daily    Patient is unable to take metformin due to elevated Scr.   Home BG Monitoring:  Checking 1or 2 time a day. Average:  UTD - did not bring in HBG records Per patient - High: 180's and Low:  87   Hypothyroidism - last thyroid panel from 02/2014 showed elevated TSH.  Levothyroxine was increased by 12.38mcg daily.  Patient is currently taking levothyroxine daily and 1/2 tablet daily.  Obesity:   Patient in last 2 months has been trying to limit high calorie foods and CHO's.  He has lost about 3.75# since 6 weeks ago.  Hyperlipidemia - patient is currently taking Crestor 10mg  daily and Welchol 625mg  3 tablets daily (started 02/2014)  Polyuria:  denies  Polydipsia:  denies Polyphagia:  denies  BMI:  Body mass index is 39.62 kg/(m^2).    General Appearance:  obese Mood/Affect:  normal  Low fat/carbohydrate diet?  No - but has tried to increase non-starchy vegetables recently and limit CHO intake Nicotine Abuse?  No Medication Compliance?  Yes Exercise?  No Alcohol Abuse?  Yes   Lab Results  Component Value Date   HGBA1C 6.8 07/04/2014    INR was 3.7 today in office Microalbumin was normal (02/28/14)   Lab Results  Component Value Date   CHOL 127 07/04/2014   HDL 24* 07/04/2014   LDLCALC 73 07/04/2014   TRIG 150* 07/04/2014      Assessment: 1.  Diabetes.  A1c at goal.  Some episodes of hypoglycemia now.   2.  Blood Pressure.    controlled 3.  Lipids.  Uncontrolled but Welchol has recently been added 5.  Supratherapeutic anticiagulation 6.  Hypothyroidism - due to recheck TSH today  Recommendations: 1.  Medication recommendations at this time are as follows:   Increase lantus to 56units BID  Decrease Humalog to 15 units with largest  meal  Continue Januvia 50mg  daily f Anticoagulation Dose Instructions as of 07/25/2014     [name] Tue Wed Thu Fri Sat   New Dose 5 mg 2.5 mg 5 mg 2.5 mg 5 mg 2.5 mg 5 mg    Description       No warfarin today - Thursday, October 22nd.  Then decrease dose to warfarin 5mg  1/2 tablet on mondays, wednesdays and fridays and 1 tablet all other days.     2.  Reviewed HBG goals:  Fasting 80-130 and 1-2 hour post prandial <180.  Patient is instructed to check BG 1-2 times per day.    3.  BP goal < 140/80. 4.  LDL goal of < 100, HDL > 40 and TG < 150. Continue crestor 10mg  daily and Welchol 625mg  3 per day 5.  Eye Exam yearly and Dental Exam every 6 months. 6.  Dietary recommendations:  Continue to limit calorie, carbohydrate and saturated fat intake.  Increase intake of fresh fruits and vegetables 7.  Physical Activity recommendations:  Discussed increasing movement and activity 8.  Return to clinic in 4 wks  Orders Placed This Encounter  Procedures  . Thyroid Panel With TSH   Time spent counseling patient:  30 minutes   Henrene Pastor, PharmD, CPP, CDE

## 2014-07-26 ENCOUNTER — Encounter: Payer: Self-pay | Admitting: *Deleted

## 2014-07-26 ENCOUNTER — Telehealth: Payer: Self-pay | Admitting: Pharmacist

## 2014-07-26 LAB — THYROID PANEL WITH TSH
Free Thyroxine Index: 3.7 (ref 1.2–4.9)
T3 UPTAKE RATIO: 30 % (ref 24–39)
T4 TOTAL: 12.2 ug/dL — AB (ref 4.5–12.0)
TSH: 2.41 u[IU]/mL (ref 0.450–4.500)

## 2014-07-26 NOTE — Telephone Encounter (Signed)
Patient's TSH WNL.  Continue current levothyroxine dose of daily + 1/2 tablet on , which equal 187. daily. Recheck thyroid in 4-6 weeks.

## 2014-08-01 ENCOUNTER — Other Ambulatory Visit: Payer: Self-pay | Admitting: Family Medicine

## 2014-08-08 ENCOUNTER — Telehealth: Payer: Self-pay | Admitting: Family Medicine

## 2014-08-09 ENCOUNTER — Encounter: Payer: Self-pay | Admitting: Internal Medicine

## 2014-08-15 ENCOUNTER — Ambulatory Visit: Payer: Self-pay

## 2014-08-21 ENCOUNTER — Other Ambulatory Visit: Payer: Self-pay | Admitting: Family Medicine

## 2014-08-31 ENCOUNTER — Inpatient Hospital Stay (HOSPITAL_COMMUNITY)
Admission: EM | Admit: 2014-08-31 | Discharge: 2014-09-06 | DRG: 637 | Disposition: A | Discharge status: Home / Self Care | Payer: BC Managed Care – PPO | Attending: Internal Medicine | Admitting: Internal Medicine

## 2014-08-31 ENCOUNTER — Emergency Department (HOSPITAL_COMMUNITY): Payer: BC Managed Care – PPO

## 2014-08-31 ENCOUNTER — Encounter (HOSPITAL_COMMUNITY): Payer: Self-pay

## 2014-08-31 DIAGNOSIS — I5023 Acute on chronic systolic (congestive) heart failure: Secondary | ICD-10-CM

## 2014-08-31 DIAGNOSIS — Z833 Family history of diabetes mellitus: Secondary | ICD-10-CM

## 2014-08-31 DIAGNOSIS — Z87891 Personal history of nicotine dependence: Secondary | ICD-10-CM | POA: Diagnosis not present

## 2014-08-31 DIAGNOSIS — Z95810 Presence of automatic (implantable) cardiac defibrillator: Secondary | ICD-10-CM

## 2014-08-31 DIAGNOSIS — Z7901 Long term (current) use of anticoagulants: Secondary | ICD-10-CM

## 2014-08-31 DIAGNOSIS — R599 Enlarged lymph nodes, unspecified: Secondary | ICD-10-CM | POA: Diagnosis present

## 2014-08-31 DIAGNOSIS — K7460 Unspecified cirrhosis of liver: Secondary | ICD-10-CM | POA: Diagnosis present

## 2014-08-31 DIAGNOSIS — E039 Hypothyroidism, unspecified: Secondary | ICD-10-CM

## 2014-08-31 DIAGNOSIS — Z86010 Personal history of colonic polyps: Secondary | ICD-10-CM

## 2014-08-31 DIAGNOSIS — Z841 Family history of disorders of kidney and ureter: Secondary | ICD-10-CM | POA: Diagnosis not present

## 2014-08-31 DIAGNOSIS — N189 Chronic kidney disease, unspecified: Secondary | ICD-10-CM | POA: Diagnosis present

## 2014-08-31 DIAGNOSIS — E872 Acidosis, unspecified: Secondary | ICD-10-CM

## 2014-08-31 DIAGNOSIS — I5043 Acute on chronic combined systolic (congestive) and diastolic (congestive) heart failure: Secondary | ICD-10-CM | POA: Diagnosis present

## 2014-08-31 DIAGNOSIS — Z79899 Other long term (current) drug therapy: Secondary | ICD-10-CM

## 2014-08-31 DIAGNOSIS — E162 Hypoglycemia, unspecified: Secondary | ICD-10-CM

## 2014-08-31 DIAGNOSIS — I429 Cardiomyopathy, unspecified: Secondary | ICD-10-CM

## 2014-08-31 DIAGNOSIS — E669 Obesity, unspecified: Secondary | ICD-10-CM

## 2014-08-31 DIAGNOSIS — R680 Hypothermia, not associated with low environmental temperature: Secondary | ICD-10-CM | POA: Diagnosis present

## 2014-08-31 DIAGNOSIS — I421 Obstructive hypertrophic cardiomyopathy: Secondary | ICD-10-CM | POA: Diagnosis present

## 2014-08-31 DIAGNOSIS — I472 Ventricular tachycardia, unspecified: Secondary | ICD-10-CM

## 2014-08-31 DIAGNOSIS — E1122 Type 2 diabetes mellitus with diabetic chronic kidney disease: Secondary | ICD-10-CM | POA: Diagnosis present

## 2014-08-31 DIAGNOSIS — E11649 Type 2 diabetes mellitus with hypoglycemia without coma: Principal | ICD-10-CM | POA: Diagnosis present

## 2014-08-31 DIAGNOSIS — I10 Essential (primary) hypertension: Secondary | ICD-10-CM

## 2014-08-31 DIAGNOSIS — E785 Hyperlipidemia, unspecified: Secondary | ICD-10-CM | POA: Diagnosis present

## 2014-08-31 DIAGNOSIS — Z6841 Body Mass Index (BMI) 40.0 and over, adult: Secondary | ICD-10-CM

## 2014-08-31 DIAGNOSIS — I442 Atrioventricular block, complete: Secondary | ICD-10-CM

## 2014-08-31 DIAGNOSIS — G4700 Insomnia, unspecified: Secondary | ICD-10-CM | POA: Diagnosis present

## 2014-08-31 DIAGNOSIS — K760 Fatty (change of) liver, not elsewhere classified: Secondary | ICD-10-CM | POA: Diagnosis present

## 2014-08-31 DIAGNOSIS — R748 Abnormal levels of other serum enzymes: Secondary | ICD-10-CM

## 2014-08-31 DIAGNOSIS — R911 Solitary pulmonary nodule: Secondary | ICD-10-CM | POA: Diagnosis present

## 2014-08-31 DIAGNOSIS — I509 Heart failure, unspecified: Secondary | ICD-10-CM

## 2014-08-31 DIAGNOSIS — N400 Enlarged prostate without lower urinary tract symptoms: Secondary | ICD-10-CM | POA: Diagnosis present

## 2014-08-31 DIAGNOSIS — N183 Chronic kidney disease, stage 3 (moderate): Secondary | ICD-10-CM | POA: Diagnosis present

## 2014-08-31 DIAGNOSIS — N179 Acute kidney failure, unspecified: Secondary | ICD-10-CM | POA: Diagnosis present

## 2014-08-31 DIAGNOSIS — Z888 Allergy status to other drugs, medicaments and biological substances status: Secondary | ICD-10-CM

## 2014-08-31 DIAGNOSIS — F1099 Alcohol use, unspecified with unspecified alcohol-induced disorder: Secondary | ICD-10-CM | POA: Diagnosis present

## 2014-08-31 DIAGNOSIS — Z794 Long term (current) use of insulin: Secondary | ICD-10-CM | POA: Diagnosis not present

## 2014-08-31 DIAGNOSIS — N058 Unspecified nephritic syndrome with other morphologic changes: Secondary | ICD-10-CM

## 2014-08-31 DIAGNOSIS — D696 Thrombocytopenia, unspecified: Secondary | ICD-10-CM | POA: Diagnosis present

## 2014-08-31 DIAGNOSIS — J9801 Acute bronchospasm: Secondary | ICD-10-CM

## 2014-08-31 DIAGNOSIS — J189 Pneumonia, unspecified organism: Secondary | ICD-10-CM | POA: Diagnosis present

## 2014-08-31 DIAGNOSIS — I240 Acute coronary thrombosis not resulting in myocardial infarction: Secondary | ICD-10-CM | POA: Diagnosis present

## 2014-08-31 DIAGNOSIS — I129 Hypertensive chronic kidney disease with stage 1 through stage 4 chronic kidney disease, or unspecified chronic kidney disease: Secondary | ICD-10-CM | POA: Diagnosis present

## 2014-08-31 DIAGNOSIS — D649 Anemia, unspecified: Secondary | ICD-10-CM | POA: Diagnosis present

## 2014-08-31 DIAGNOSIS — E875 Hyperkalemia: Secondary | ICD-10-CM | POA: Diagnosis present

## 2014-08-31 DIAGNOSIS — N289 Disorder of kidney and ureter, unspecified: Secondary | ICD-10-CM

## 2014-08-31 DIAGNOSIS — I5022 Chronic systolic (congestive) heart failure: Secondary | ICD-10-CM

## 2014-08-31 DIAGNOSIS — R062 Wheezing: Secondary | ICD-10-CM | POA: Diagnosis present

## 2014-08-31 DIAGNOSIS — I5041 Acute combined systolic (congestive) and diastolic (congestive) heart failure: Secondary | ICD-10-CM

## 2014-08-31 DIAGNOSIS — E1129 Type 2 diabetes mellitus with other diabetic kidney complication: Secondary | ICD-10-CM

## 2014-08-31 DIAGNOSIS — E119 Type 2 diabetes mellitus without complications: Secondary | ICD-10-CM

## 2014-08-31 DIAGNOSIS — T68XXXA Hypothermia, initial encounter: Secondary | ICD-10-CM

## 2014-08-31 DIAGNOSIS — I513 Intracardiac thrombosis, not elsewhere classified: Secondary | ICD-10-CM

## 2014-08-31 DIAGNOSIS — T68XXXD Hypothermia, subsequent encounter: Secondary | ICD-10-CM

## 2014-08-31 LAB — COMPREHENSIVE METABOLIC PANEL
ALBUMIN: 2.6 g/dL — AB (ref 3.5–5.2)
ALK PHOS: 124 U/L — AB (ref 39–117)
ALT: 49 U/L (ref 0–53)
AST: 87 U/L — AB (ref 0–37)
Anion gap: 12 (ref 5–15)
BUN: 45 mg/dL — ABNORMAL HIGH (ref 6–23)
CALCIUM: 8.8 mg/dL (ref 8.4–10.5)
CO2: 24 mEq/L (ref 19–32)
Chloride: 104 mEq/L (ref 96–112)
Creatinine, Ser: 2.51 mg/dL — ABNORMAL HIGH (ref 0.50–1.35)
GFR calc Af Amer: 31 mL/min — ABNORMAL LOW (ref >90)
GFR calc non Af Amer: 27 mL/min — ABNORMAL LOW (ref >90)
GLUCOSE: 128 mg/dL — AB (ref 70–99)
POTASSIUM: 5 meq/L (ref 3.7–5.3)
SODIUM: 140 meq/L (ref 137–147)
TOTAL PROTEIN: 6.7 g/dL (ref 6.0–8.3)
Total Bilirubin: 0.6 mg/dL (ref 0.3–1.2)

## 2014-08-31 LAB — CBC WITH DIFFERENTIAL/PLATELET
BASOS ABS: 0 10*3/uL (ref 0.0–0.1)
Basophils Relative: 0 % (ref 0–1)
EOS ABS: 0 10*3/uL (ref 0.0–0.7)
EOS PCT: 1 % (ref 0–5)
HCT: 33.4 % — ABNORMAL LOW (ref 39.0–52.0)
Hemoglobin: 10.7 g/dL — ABNORMAL LOW (ref 13.0–17.0)
LYMPHS ABS: 0.6 10*3/uL — AB (ref 0.7–4.0)
Lymphocytes Relative: 11 % — ABNORMAL LOW (ref 12–46)
MCH: 26.9 pg (ref 26.0–34.0)
MCHC: 32 g/dL (ref 30.0–36.0)
MCV: 83.9 fL (ref 78.0–100.0)
Monocytes Absolute: 0.3 10*3/uL (ref 0.1–1.0)
Monocytes Relative: 5 % (ref 3–12)
NEUTROS PCT: 83 % — AB (ref 43–77)
Neutro Abs: 4.9 10*3/uL (ref 1.7–7.7)
PLATELETS: 74 10*3/uL — AB (ref 150–400)
RBC: 3.98 MIL/uL — AB (ref 4.22–5.81)
RDW: 15.8 % — ABNORMAL HIGH (ref 11.5–15.5)
WBC: 5.9 10*3/uL (ref 4.0–10.5)

## 2014-08-31 LAB — URINALYSIS, ROUTINE W REFLEX MICROSCOPIC
Bilirubin Urine: NEGATIVE
Glucose, UA: NEGATIVE mg/dL
Hgb urine dipstick: NEGATIVE
Ketones, ur: NEGATIVE mg/dL
LEUKOCYTES UA: NEGATIVE
NITRITE: NEGATIVE
PH: 5 (ref 5.0–8.0)
Protein, ur: NEGATIVE mg/dL
SPECIFIC GRAVITY, URINE: 1.01 (ref 1.005–1.030)
Urobilinogen, UA: 0.2 mg/dL (ref 0.0–1.0)

## 2014-08-31 LAB — I-STAT TROPONIN, ED: TROPONIN I, POC: 0.06 ng/mL (ref 0.00–0.08)

## 2014-08-31 LAB — PROCALCITONIN: PROCALCITONIN: 0.22 ng/mL

## 2014-08-31 LAB — TSH: TSH: 0.708 u[IU]/mL (ref 0.350–4.500)

## 2014-08-31 LAB — STREP PNEUMONIAE URINARY ANTIGEN: Strep Pneumo Urinary Antigen: NEGATIVE

## 2014-08-31 LAB — PROTIME-INR
INR: 3.01 — ABNORMAL HIGH (ref 0.00–1.49)
PROTHROMBIN TIME: 31.4 s — AB (ref 11.6–15.2)

## 2014-08-31 LAB — I-STAT CG4 LACTIC ACID, ED: Lactic Acid, Venous: 1.05 mmol/L (ref 0.5–2.2)

## 2014-08-31 LAB — PRO B NATRIURETIC PEPTIDE: Pro B Natriuretic peptide (BNP): 1597 pg/mL — ABNORMAL HIGH (ref 0–125)

## 2014-08-31 LAB — MRSA PCR SCREENING: MRSA by PCR: NEGATIVE

## 2014-08-31 LAB — GLUCOSE, CAPILLARY: Glucose-Capillary: 309 mg/dL — ABNORMAL HIGH (ref 70–99)

## 2014-08-31 MED ORDER — ROSUVASTATIN CALCIUM 10 MG PO TABS
10.0000 mg | ORAL_TABLET | Freq: Every evening | ORAL | Status: DC
  Administered 2014-08-31 – 2014-09-05 (×6): 10 mg via ORAL
  Filled 2014-08-31 (×7): qty 1

## 2014-08-31 MED ORDER — AMIODARONE HCL 200 MG PO TABS: 200.0000 mg | ORAL_TABLET | Freq: Every day | ORAL | Status: DC

## 2014-08-31 MED ORDER — IPRATROPIUM-ALBUTEROL 0.5-2.5 (3) MG/3ML IN SOLN
3.0000 mL | Freq: Four times a day (QID) | RESPIRATORY_TRACT | Status: DC
  Administered 2014-08-31 – 2014-09-02 (×7): 3 mL via RESPIRATORY_TRACT
  Filled 2014-08-31 (×7): qty 3

## 2014-08-31 MED ORDER — LEVOFLOXACIN IN D5W 750 MG/150ML IV SOLN
750.0000 mg | Freq: Once | INTRAVENOUS | Status: AC
  Administered 2014-08-31: 750 mg via INTRAVENOUS
  Filled 2014-08-31: qty 150

## 2014-08-31 MED ORDER — ALBUTEROL SULFATE (2.5 MG/3ML) 0.083% IN NEBU
2.5000 mg | INHALATION_SOLUTION | RESPIRATORY_TRACT | Status: DC | PRN
  Administered 2014-08-31: 2.5 mg via RESPIRATORY_TRACT

## 2014-08-31 MED ORDER — ALBUTEROL (5 MG/ML) CONTINUOUS INHALATION SOLN
10.0000 mg/h | INHALATION_SOLUTION | RESPIRATORY_TRACT | Status: DC
  Administered 2014-08-31: 10 mg/h via RESPIRATORY_TRACT
  Filled 2014-08-31: qty 20

## 2014-08-31 MED ORDER — MAGNESIUM HYDROXIDE 400 MG/5ML PO SUSP: 30.0000 mL | Freq: Every day | ORAL | Status: DC | PRN

## 2014-08-31 MED ORDER — ALBUTEROL SULFATE (2.5 MG/3ML) 0.083% IN NEBU
INHALATION_SOLUTION | RESPIRATORY_TRACT | Status: AC
  Filled 2014-08-31: qty 3

## 2014-08-31 MED ORDER — ACETAMINOPHEN 650 MG RE SUPP: 650.0000 mg | Freq: Four times a day (QID) | RECTAL | Status: DC | PRN

## 2014-08-31 MED ORDER — ALUM & MAG HYDROXIDE-SIMETH 200-200-20 MG/5ML PO SUSP: 30.0000 mL | Freq: Four times a day (QID) | ORAL | Status: DC | PRN

## 2014-08-31 MED ORDER — AMIODARONE HCL 200 MG PO TABS
400.0000 mg | ORAL_TABLET | ORAL | Status: DC
  Administered 2014-09-02 – 2014-09-06 (×5): 400 mg via ORAL
  Filled 2014-08-31 (×5): qty 2

## 2014-08-31 MED ORDER — LEVOTHYROXINE SODIUM 125 MCG PO TABS
187.5000 ug | ORAL_TABLET | Freq: Every day | ORAL | Status: DC
  Administered 2014-09-01 – 2014-09-06 (×6): 187.5 ug via ORAL
  Filled 2014-08-31 (×7): qty 1.5

## 2014-08-31 MED ORDER — INSULIN ASPART 100 UNIT/ML ~~LOC~~ SOLN
0.0000 [IU] | Freq: Every day | SUBCUTANEOUS | Status: DC
  Administered 2014-08-31: 4 [IU] via SUBCUTANEOUS

## 2014-08-31 MED ORDER — COLESEVELAM HCL 625 MG PO TABS
625.0000 mg | ORAL_TABLET | Freq: Two times a day (BID) | ORAL | Status: DC
  Administered 2014-08-31 – 2014-09-06 (×12): 625 mg via ORAL
  Filled 2014-08-31 (×14): qty 1

## 2014-08-31 MED ORDER — WARFARIN - PHARMACIST DOSING INPATIENT: Freq: Every day | Status: DC

## 2014-08-31 MED ORDER — ONDANSETRON HCL 4 MG/2ML IJ SOLN
4.0000 mg | Freq: Four times a day (QID) | INTRAMUSCULAR | Status: DC | PRN
Start: 2014-08-31 — End: 2014-09-06

## 2014-08-31 MED ORDER — LEVOFLOXACIN IN D5W 750 MG/150ML IV SOLN: 750.0000 mg | INTRAVENOUS | Status: DC

## 2014-08-31 MED ORDER — AMIODARONE HCL 200 MG PO TABS
200.0000 mg | ORAL_TABLET | ORAL | Status: DC
  Administered 2014-09-01: 200 mg via ORAL
  Filled 2014-08-31: qty 1

## 2014-08-31 MED ORDER — METHYLPREDNISOLONE SODIUM SUCC 125 MG IJ SOLR
125.0000 mg | Freq: Once | INTRAMUSCULAR | Status: AC
  Administered 2014-08-31: 125 mg via INTRAVENOUS
  Filled 2014-08-31: qty 2

## 2014-08-31 MED ORDER — GUAIFENESIN-DM 100-10 MG/5ML PO SYRP: 5.0000 mL | ORAL_SOLUTION | ORAL | Status: DC | PRN

## 2014-08-31 MED ORDER — SODIUM CHLORIDE 0.9 % IV SOLN
INTRAVENOUS | Status: AC
  Administered 2014-08-31: 19:00:00 via INTRAVENOUS

## 2014-08-31 MED ORDER — ONDANSETRON HCL 4 MG PO TABS: 4.0000 mg | ORAL_TABLET | Freq: Four times a day (QID) | ORAL | Status: DC | PRN

## 2014-08-31 MED ORDER — MEXILETINE HCL 150 MG PO CAPS
150.0000 mg | ORAL_CAPSULE | Freq: Three times a day (TID) | ORAL | Status: DC
  Administered 2014-08-31 – 2014-09-06 (×17): 150 mg via ORAL
  Filled 2014-08-31 (×20): qty 1

## 2014-08-31 MED ORDER — ACETAMINOPHEN 325 MG PO TABS: 650.0000 mg | ORAL_TABLET | Freq: Four times a day (QID) | ORAL | Status: DC | PRN

## 2014-08-31 MED ORDER — INSULIN ASPART 100 UNIT/ML ~~LOC~~ SOLN
0.0000 [IU] | Freq: Three times a day (TID) | SUBCUTANEOUS | Status: DC
  Administered 2014-09-01: 11 [IU] via SUBCUTANEOUS

## 2014-08-31 MED ORDER — IPRATROPIUM-ALBUTEROL 0.5-2.5 (3) MG/3ML IN SOLN
3.0000 mL | Freq: Once | RESPIRATORY_TRACT | Status: AC
  Administered 2014-08-31: 3 mL via RESPIRATORY_TRACT
  Filled 2014-08-31: qty 3

## 2014-08-31 MED ORDER — SODIUM CHLORIDE 0.9 % IJ SOLN
3.0000 mL | Freq: Two times a day (BID) | INTRAMUSCULAR | Status: DC
  Administered 2014-09-01 – 2014-09-06 (×10): 3 mL via INTRAVENOUS

## 2014-08-31 NOTE — ED Provider Notes (Signed)
CSN: [id number]     Arrival date & time 08/31/14  1206 History   First MD Initiated Contact with Patient 08/31/14 1232     Chief Complaint  Patient presents with  . Hypoglycemia  . Shortness of Breath   HPI  Patient is a 58 year old male who presents to the emergency room via EMS for hypoglycemia and shortness of breath. Patient has a past medical history of diabetes, hyperlipidemia, hypertension, coronary artery disease, complete heart block, systolic CHF, and smoking history. Per EMS the patient was found hypoglycemic with a blood sugar of 36 while sitting on his couch watching TV. Patient was given peanut better sandwich and soda after an amp of D50 with improvement. In route patient's blood sugar dropped again and was given another amp of D50. Upon arrival patient was noted to be extremely short of breath. Patient states that he has been having worsening shortness of breath for the past and half to 2 weeks. He has been coughing and has been producing white sputum. He denies fevers, chills, nausea, vomiting, chest pain, melena, hematochezia, abdominal pain, urinary symptoms. Patient does admit to orthopnea and PND. Patient states he has been taking his medications as prescribed. Of note patient was cold on arrival with a temperature of 93.7 per the patient he lives in a wood-burning stove house with no heat.  Past Medical History  Diagnosis Date  . DM   . HYPERLIPIDEMIA-MIXED   . Obesity, unspecified   . HYPERTENSION, UNSPECIFIED   . CAD (coronary artery disease)     a. Nonobstructive moderate CAD by cath - last 2012.  [name] Hypertrophic obstructive cardiomyopathy   . Complete heart block     a. s/p pacemaker later upgraded to CRT-D.  [name] CKD (chronic kidney disease)     a. Baseline Cr 1.4-1.5.  [name] Chronic systolic CHF (congestive heart failure)     a. NICM (EF 25% in 2012, 20-25% in 09/2013). b. s/p BiV-ICD (MDT).  . Colon polyps   . Ventricular tachycardia     a. EPS/RFCA of VT in 2012. b.  On amio, mexilitene. c. Last BiV-ICD gen change 05/2013 (MDT).  . Hypothyroidism   . Fatty liver disease, nonalcoholic   . BPH (benign prostatic hyperplasia)   . Habitual alcohol use    Past Surgical History  Procedure Laterality Date  . Pacemaker insertion      medtronic  . Cardiac catheterization    . Facial reconstruction surgery      after trauma in 2006   Family History  Problem Relation Age of Onset  . Diabetic kidney disease Father   . Coronary artery disease Neg Hx    History  Substance Use Topics  . Smoking status: Former Smoker    Types: Cigarettes    Quit date: 10/04/1968  . Smokeless tobacco: Never Used     Comment: 30+ yrs  . Alcohol Use: Yes     Comment: about 1 cup of liquor daily (straight liquor - not mixed)    Review of Systems  Constitutional: Negative for fever, chills and fatigue.  Respiratory: Positive for cough, chest tightness, shortness of breath and wheezing. Negative for stridor.   Cardiovascular: Negative for chest pain, palpitations and leg swelling.  Gastrointestinal: Negative for nausea, vomiting, abdominal pain, diarrhea, constipation and blood in stool.  Neurological: Negative for dizziness, seizures, speech difficulty, light-headedness and numbness.  All other systems reviewed and are negative.     Allergies  Ace inhibitors; Nitroglycerin; Procan sr; and Quinidine  Home Medications   Prior to Admission medications   Medication Sig Start Date End Date Taking? Authorizing Provider  amiodarone (PACERONE) 400 MG tablet Take 0.5-1 tablets (200-400 mg total) by mouth daily. Take 200mg  on Sat and Sun take 400mg  all other days 02/28/14   Ernestina Penna, MD  carvedilol (COREG) 25 MG tablet Take 1 tablet (25 mg total) by mouth 2 (two) times daily with a meal.    Rollene Rotunda, MD  cholecalciferol (VITAMIN D) 1000 UNITS tablet Take 2,000 Units by mouth daily.     Historical Provider, MD  fish oil-omega-3 fatty acids 1000 MG capsule Take 3  capsules by mouth daily.     Historical Provider, MD  furosemide (LASIX) 40 MG tablet Take by mouth every morning. TAKE 1 TABLET DAILY 02/28/14   Ernestina Penna, MD  insulin glargine (LANTUS) 100 UNIT/ML injection Inject 0.56 mLs (56 Units total) into the skin 2 (two) times daily. 07/25/14   Tammy Eckard, PHARMD  insulin lispro (HUMALOG KWIKPEN) 100 UNIT/ML KiwkPen Inject 16 units with largest/evening meal.Dispense quikpens 02/28/14   Ernestina Penna, MD  levothyroxine (LEVOTHROID) 25 MCG tablet Take 0.5 tablets (12.5 mcg total) by mouth daily before breakfast. 07/26/14   Tammy Eckard, PHARMD  levothyroxine (SYNTHROID, LEVOTHROID) 175 MCG tablet TAKE 1 TABLET DAILY BEFORE BREAKFAST 06/19/14   Ernestina Penna, MD  levothyroxine (SYNTHROID, LEVOTHROID) 75 MCG tablet TAKE 1/2 TABLET (=37.5MCG) ALONG WITH LEVOTHYROXINE TO EQUAL 187. DAILY. 08/05/14   Ernestina Penna, MD  lisinopril (PRINIVIL,ZESTRIL) 2.5 MG tablet TAKE 1 TABLET TWICE A DAY 08/21/14   Ernestina Penna, MD  mexiletine (MEXITIL) 150 MG capsule TAKE 1 CAPSULE EVERY 8 HOURS 02/28/14   Ernestina Penna, MD  rosuvastatin (CRESTOR) 20 MG tablet Take 0.5 tablets (10 mg total) by mouth daily. 03/04/14   Ernestina Penna, MD  sitaGLIPtin (JANUVIA) 50 MG tablet Take 1 tablet (50 mg total) by mouth daily. 09/03/13   Tammy Eckard, PHARMD  warfarin (COUMADIN) 5 MG tablet Take 1 tablet by mouth daily or as directed by anticoagulation clinic 06/19/14   Ernestina Penna, MD  [facility] 625 MG tablet TAKE 1 TABLET THREE TIMES A DAY 02/28/14   Ernestina Penna, MD   BP 112/59 mmHg  Pulse 59  Temp(Src) 94 F (34.4 C) (Oral)  Resp 18  Ht 6\' 1"  (1.854 m)  Wt 309 lb (140.161 kg)  BMI 40.78 kg/m2  SpO2 97% Physical Exam  Constitutional: He is oriented to person, place, and time. He appears well-developed and well-nourished. No distress.  HENT:  Head: Normocephalic and atraumatic.  Mouth/Throat: Oropharynx is clear and moist. No oropharyngeal exudate.  Eyes:  Conjunctivae and EOM are normal. Pupils are equal, round, and reactive to light. No scleral icterus.  Neck: Normal range of motion. Neck supple. No JVD present. No thyromegaly present.  Cardiovascular: Normal rate, regular rhythm, normal heart sounds and intact distal pulses.  Exam reveals no gallop and no friction rub.   No murmur heard. Pulmonary/Chest: No respiratory distress. He has wheezes. He has no rales. He exhibits no tenderness.  Diffuse wheezing in all lung fields. There is diminished lung sounds in the right lower lung base  Abdominal: Soft. Bowel sounds are normal. He exhibits no distension and no mass. There is no tenderness. There is no rebound and no guarding.  Musculoskeletal: Normal range of motion.  Lymphadenopathy:    He has no cervical adenopathy.  Neurological: He is alert and oriented to  person, place, and time. He has normal strength. No cranial nerve deficit or sensory deficit. Coordination normal.  Skin: Skin is warm and dry. He is not diaphoretic.  Psychiatric: He has a normal mood and affect. His behavior is normal. Judgment and thought content normal.  Nursing note and vitals reviewed.   ED Course  Procedures (including critical care time) Labs Review Labs Reviewed  CBC WITH DIFFERENTIAL - Abnormal; Notable for the following:    RBC 3.98 (*)    Hemoglobin 10.7 (*)    HCT 33.4 (*)    RDW 15.8 (*)    Platelets 74 (*)    Neutrophils Relative % 83 (*)    Lymphocytes Relative 11 (*)    Lymphs Abs 0.6 (*)    All other components within normal limits  COMPREHENSIVE METABOLIC PANEL - Abnormal; Notable for the following:    Glucose, Bld 128 (*)    BUN 45 (*)    Creatinine, Ser 2.51 (*)    Albumin 2.6 (*)    AST 87 (*)    Alkaline Phosphatase 124 (*)    GFR calc non Af Amer 27 (*)    GFR calc Af Amer 31 (*)    All other components within normal limits  PRO B NATRIURETIC PEPTIDE - Abnormal; Notable for the following:    Pro B Natriuretic peptide (BNP) 1597.0  (*)    All other components within normal limits  CULTURE, BLOOD (ROUTINE X 2)  CULTURE, BLOOD (ROUTINE X 2)  URINALYSIS, ROUTINE W REFLEX MICROSCOPIC  I-STAT TROPOININ, ED  I-STAT CG4 LACTIC ACID, ED    Imaging Review Dg Chest 2 View  08/31/2014   CLINICAL DATA:  Hyperglycemia, shortness of breath  EXAM: CHEST  2 VIEW  COMPARISON:  06/14/2014  FINDINGS: Cardiomegaly is noted. Central mild vascular congestion without convincing pulmonary edema. For leads cardiac pacemaker is unchanged in position. Mild basilar atelectasis or infiltrate.  IMPRESSION: Central mild vascular congestion without convincing pulmonary edema. Mild basilar atelectasis or infiltrate. For leads cardiac pacemaker unchanged in position.   Electronically Signed   By: Natasha Mead M.D.   On: 08/31/2014 13:43     EKG Interpretation   Date/Time:  Saturday August 31 2014 12:18:34 EST Ventricular Rate:  65 PR Interval:  154 QRS Duration: 232 QT Interval:  581 QTC Calculation: 604 R Axis:   -105 Text Interpretation:  Sinus rhythm Ventricular premature complex  Nonspecific IVCD with LAD Borderline abnrm T, anterolateral leads No  significant change since last tracing Confirmed by BEATON  MD, ROBERT  ([id number]) on 08/31/2014 12:38:41 PM      MDM   Final diagnoses:  Acute renal failure, unspecified acute renal failure type  Bronchospasm, acute  Chronic congestive heart failure, unspecified congestive heart failure type  Hypoglycemia   Patient is a 58 year old male who presents to the emergency room for hypoglycemia and shortness of breath. Physical exam reveals alert and oriented 4 male with diffuse wheezing in all lung fields. EKG reveals no changes from prior tracings. Chest x-ray reveals central mild vascular congestion with atelectasis versus infiltrate in the right lower lobe. Upon arrival patient was hypothermic with temperatures rectally of 93.19F. Patient placed in a bear hugger blanket. Patient was given 1  DuoNeb treatment with continued wheezing. I will give the patient a 10 mg continuous albuterol nebulizer treatment and 125 mg of Solu-Medrol at this time. UA is negative. I-STAT troponin is negative. I-STAT lactic acid is negative. BNP is mildly elevated at 1500. CMP  reveals acute renal failure with stable blood glucose.  CBC reveals anemia with thrombocytopenia with no evidence of leukocytosis.  On recheck the patient has less wheezing after continuous nebulizer treatment, but is still wheezing in all lung fields.  Patient feels much better after being placed in bair hugger.  I will triad for admission at this time for admission. I spoke with Dr. Lendell CapriceSullivan who will admit the patient to stepdown given temperature, renal failure, and wheezing. Temporary admit orders have been placed.  Eben Burowourtney A Forcucci, PA-C 08/31/14 1554  Nelia Shiobert L Beaton, MD 08/31/14 [phone]

## 2014-08-31 NOTE — ED Notes (Signed)
Respiratory and Phlebotomy at the bedside. 

## 2014-08-31 NOTE — H&P (Signed)
[facility] History and Physical  Dakota MatasDonald R Artz Sr. ZOX:[id number] DOB: 09/23/1956 DOA: 08/31/2014  Referring physician: EDP PCP: Rudi HeapMOORE, Jerret, MD   Chief Complaint: shortness of breath and low blood sugar  HPI: Dakota BrookDonald R Dehnert Sr. is a 58 y.o. male  With h/o DM, CKD stage III, chronic systolic heart failure with EF 25%, apical thrombus, htn, presents to ED with blood glucose in the 30s this morning.  Family noted he was lethargic and confused, called EMS.  recived 2 amps D50 en route. He also c/o wheezing, shortness of breath and cough for a week.  Has had several hypoglycemic episodes over the past week. In ED, hypothermic. Rectal temp 93.8 degrees F.  Bear Hugger placed.  CBG 128.  CXR shows bibasilar atelectasis vs infiltrate. WBC normal, UA negative. Lactic acid normal.  Creatinine 2.5 (baseline creatinine about 1.5).  Patient was noted to be wheezing, required hour long nebulizer and Solumedrol.  Denies fever, chills, dysuria, orthopnea.   Review of Systems:  As above. Systems reviewed. Otherwise negative.   Past Medical History  Diagnosis Date  . DM   . HYPERLIPIDEMIA-MIXED   . Obesity, unspecified   . HYPERTENSION, UNSPECIFIED   . CAD (coronary artery disease)     a. Nonobstructive moderate CAD by cath - last 2012.  [name]. Hypertrophic obstructive cardiomyopathy   . Complete heart block     a. s/p pacemaker later upgraded to CRT-D.  [name]. CKD (chronic kidney disease)     a. Baseline Cr 1.4-1.5.  [name]. Chronic systolic CHF (congestive heart failure)     a. NICM (EF 25% in 2012, 20-25% in 09/2013). b. s/p BiV-ICD (MDT).  . Colon polyps   . Ventricular tachycardia     a. EPS/RFCA of VT in 2012. b. On amio, mexilitene. c. Last BiV-ICD gen change 05/2013 (MDT).  . Hypothyroidism   . Fatty liver disease, nonalcoholic   . BPH (benign prostatic hyperplasia)   . Habitual alcohol use    Past Surgical History  Procedure Laterality Date  . Pacemaker insertion      medtronic  .  Cardiac catheterization    . Facial reconstruction surgery      after trauma in 2006   Social History:  reports that he quit smoking about 45 years ago. His smoking use included Cigarettes. He smoked 0.00 packs per day. He has never used smokeless tobacco. He reports that he drinks alcohol. He reports that he does not use illicit drugs.  Allergies  Allergen Reactions  . Ace Inhibitors     AVOID ALL  BECAUSE OF HEART DISEASE   . Nitroglycerin Other (See Comments)    Reaction unknown  . Procan Sr [Procainamide Hcl]     PT. HAS HEART DISEASE CALLED HYPERTHROPHIC CARDIOMYOPATHY  . Quinidine     PT. HAS HEART DISEASE CALLED HYPERTHROPHIC CARDIOMYOPATHY      Family History  Problem Relation Age of Onset  . Diabetic kidney disease Father   . Coronary artery disease Neg Hx      Prior to Admission medications   Medication Sig Start Date End Date Taking? Authorizing Provider  amiodarone (PACERONE) 400 MG tablet Take 0.5-1 tablets (200-400 mg total) by mouth daily. Take 200mg  on Sat and Sun take 400mg  all other days 02/28/14   Ernestina Pennaonald W Moore, MD  carvedilol (COREG) 25 MG tablet Take 1 tablet (25 mg total) by mouth 2 (two) times daily with a meal.    Rollene RotundaJames Hochrein, MD  cholecalciferol (VITAMIN D) 1000  UNITS tablet Take 2,000 Units by mouth daily.     Historical Provider, MD  fish oil-omega-3 fatty acids 1000 MG capsule Take 3 capsules by mouth daily.     Historical Provider, MD  furosemide (LASIX) 40 MG tablet Take by mouth every morning. TAKE 1 TABLET DAILY 02/28/14   Ernestina Penna, MD  insulin glargine (LANTUS) 100 UNIT/ML injection Inject 0.56 mLs (56 Units total) into the skin 2 (two) times daily. 07/25/14   Tammy Eckard, PHARMD  insulin lispro (HUMALOG KWIKPEN) 100 UNIT/ML KiwkPen Inject 16 units with largest/evening meal.Dispense quikpens 02/28/14   Ernestina Penna, MD  levothyroxine (LEVOTHROID) 25 MCG tablet Take 0.5 tablets (12.5 mcg total) by mouth daily before breakfast. 07/26/14    Tammy Eckard, PHARMD  levothyroxine (SYNTHROID, LEVOTHROID) 175 MCG tablet TAKE 1 TABLET DAILY BEFORE BREAKFAST 06/19/14   Ernestina Penna, MD  levothyroxine (SYNTHROID, LEVOTHROID) 75 MCG tablet TAKE 1/2 TABLET (=37.5MCG) ALONG WITH LEVOTHYROXINE TO EQUAL 187. DAILY. 08/05/14   Ernestina Penna, MD  lisinopril (PRINIVIL,ZESTRIL) 2.5 MG tablet TAKE 1 TABLET TWICE A DAY 08/21/14   Ernestina Penna, MD  mexiletine (MEXITIL) 150 MG capsule TAKE 1 CAPSULE EVERY 8 HOURS 02/28/14   Ernestina Penna, MD  rosuvastatin (CRESTOR) 20 MG tablet Take 0.5 tablets (10 mg total) by mouth daily. 03/04/14   Ernestina Penna, MD  sitaGLIPtin (JANUVIA) 50 MG tablet Take 1 tablet (50 mg total) by mouth daily. 09/03/13   Tammy Eckard, PHARMD  warfarin (COUMADIN) 5 MG tablet Take 1 tablet by mouth daily or as directed by anticoagulation clinic 06/19/14   Ernestina Penna, MD  [facility] 625 MG tablet TAKE 1 TABLET THREE TIMES A DAY 02/28/14   Ernestina Penna, MD   Physical Exam: Filed Vitals:   08/31/14 1541 08/31/14 1600 08/31/14 1645 08/31/14 1658  BP:  115/73 105/56   Pulse:  60 63   Temp: 94 F (34.4 C)   97.5 F (36.4 C)  TempSrc: Oral   Oral  Resp:  23 21   Height:      Weight:      SpO2:  96% 93%     Wt Readings from Last 3 Encounters:  08/31/14 140.161 kg (309 lb)  07/04/14 140.161 kg (309 lb)  06/14/14 141.704 kg (312 lb 6.4 oz)  BP 134/63 mmHg  Pulse 60  Temp(Src) 98.6 F (37 C) (Oral)  Resp 20  Ht 6\' 1"  (1.854 m)  Wt 139.1 kg (306 lb 10.6 oz)  BMI 40.47 kg/m2  SpO2 94%  General Appearance:    Alert, cooperative, oriented  Head:    Normocephalic, without obvious abnormality, atraumatic  Eyes:    PERRL, conjunctiva/corneas clear, EOM's intact,      Nose:   Nares normal, septum midline, mucosa normal, no drainage   or sinus tenderness  Throat:   Lips, mucosa, and tongue normal; teeth and gums normal  Neck:   Supple, symmetrical, trachea midline, no adenopathy;       thyroid:  No  enlargement/tenderness/nodules; no carotid   bruit or JVD  Back:     Symmetric, no curvature, ROM normal, no CVA tenderness  Lungs:     Bilateral wheeze. No rhonchi or rales.  Chest wall:    No tenderness or deformity  Heart:    Regular rate and rhythm, S1 and S2 normal, no murmur, rub   or gallop  Abdomen:     Soft, non-tender, bowel sounds active. obese  Genitalia:  deferred  Rectal:    deferred  Extremities:   2+ pitting edema. Pulses intact. No clubbing, cyanosis  Pulses:   2+ and symmetric all extremities  Skin:   Skin color, texture, turgor normal, no rashes or lesions  Lymph nodes:   Cervical, supraclavicular, and axillary nodes normal  Neurologic:   CNII-XII intact. Normal strength, sensation and reflexes      throughout    Psych: normal affect        Labs on Admission:  Basic Metabolic Panel:  Recent Labs Lab 08/31/14 1350  NA 140  K 5.0  CL 104  CO2 24  GLUCOSE 128*  BUN 45*  CREATININE 2.51*  CALCIUM 8.8   Liver Function Tests:  Recent Labs Lab 08/31/14 1350  AST 87*  ALT 49  ALKPHOS 124*  BILITOT 0.6  PROT 6.7  ALBUMIN 2.6*   No results for input(s): LIPASE, AMYLASE in the last 168 hours. No results for input(s): AMMONIA in the last 168 hours. CBC:  Recent Labs Lab 08/31/14 1350  WBC 5.9  NEUTROABS 4.9  HGB 10.7*  HCT 33.4*  MCV 83.9  PLT 74*   Cardiac Enzymes: No results for input(s): CKTOTAL, CKMB, CKMBINDEX, TROPONINI in the last 168 hours.  BNP (last 3 results)  Recent Labs  09/29/13 0302 10/01/13 0431 08/31/14 1350  PROBNP 12088.0* 19037.0* 1597.0*   CBG: No results for input(s): GLUCAP in the last 168 hours.  Radiological Exams on Admission: Dg Chest 2 View  08/31/2014   CLINICAL DATA:  Hyperglycemia, shortness of breath  EXAM: CHEST  2 VIEW  COMPARISON:  06/14/2014  FINDINGS: Cardiomegaly is noted. Central mild vascular congestion without convincing pulmonary edema. For leads cardiac pacemaker is unchanged in  position. Mild basilar atelectasis or infiltrate.  IMPRESSION: Central mild vascular congestion without convincing pulmonary edema. Mild basilar atelectasis or infiltrate. For leads cardiac pacemaker unchanged in position.   Electronically Signed   By: Natasha Mead M.D.   On: 08/31/2014 13:43    EKG: tracing reviewed. Ventricular paced  Assessment/Plan Principal Problem:   Hypothermia:  May be related to hypoglycemia, but with wheeze, cough, abnormal CXR, acute renal failure, concerned about sepsis. Blood pressure stabe. Admit to SDU. Bear hugger. Check TSH. Check blood cultures. IV antibiotics. Active Problems:   Wheezing: bronchodilators. Hold further steroids for now.   Hypoglycemia: may be related to acute renal failure, r/o sepsis. Hold lantus. SSI only for now. Check hgb a1C   Hyperlipidemia   Obesity   HTN (hypertension):  Hold antihypertensives for now   Automatic implantable cardioverter-defibrillator, pacemaker in situ   Hypothyroidism check TSH   Acute renal failure: hold diuretics, ace inhibitor, gentle hydration, watching for pulmonary edema.   Type 2 diabetes mellitus with renal manifestations, controlled   Mural thrombus of left ventricle: coumadin per pharmacy   Chronic systolic heart failure: EF 25%. Watch for fluid overload   Anemia   Thrombocytopenia: r/o sepsis. Monitor.  Code Status: full Family Communication: multiple at bedside Disposition Plan: admit to stepdown  Time spent: 60 minutes  Cariana Karge L [facility] Text page [email or web address] password [facility]

## 2014-08-31 NOTE — ED Notes (Signed)
Applied warm blankets to patient. Patient temperature to be rechecked.

## 2014-08-31 NOTE — Progress Notes (Addendum)
ANTIBIOTIC + ANTICOAGULATION CONSULT NOTE - INITIAL  Pharmacy Consult for levaquin + warfarin Indication: pneumonia and hx LV thrombus  Allergies  Allergen Reactions  . Ace Inhibitors     AVOID ALL  BECAUSE OF HEART DISEASE   . Nitroglycerin Other (See Comments)    Reaction unknown  . Procan Sr [Procainamide Hcl]     PT. HAS HEART DISEASE CALLED HYPERTHROPHIC CARDIOMYOPATHY  . Quinidine     PT. HAS HEART DISEASE CALLED HYPERTHROPHIC CARDIOMYOPATHY      Patient Measurements: Height: 6\' 1"  (185.4 cm) Weight: (!) 309 lb (140.161 kg) IBW/kg (Calculated) : 79.9 Adjusted Body Weight:   Vital Signs: Temp: 97.5 F (36.4 C) (11/28 1658) Temp Source: Oral (11/28 1658) BP: 105/56 mmHg (11/28 1645) Pulse Rate: 63 (11/28 1645) Intake/Output from previous day:   Intake/Output from this shift: Total I/O In: -  Out: 400 [Urine:400]  Labs:  Recent Labs  08/31/14 1350  WBC 5.9  HGB 10.7*  PLT 74*  CREATININE 2.51*   Estimated Creatinine Clearance: 47.2 mL/min (by C-G formula based on Cr of 2.51). No results for input(s): VANCOTROUGH, VANCOPEAK, VANCORANDOM, GENTTROUGH, GENTPEAK, GENTRANDOM, TOBRATROUGH, TOBRAPEAK, TOBRARND, AMIKACINPEAK, AMIKACINTROU, AMIKACIN in the last 72 hours.   Microbiology: No results found for this or any previous visit (from the past 720 hour(s)).  Medical History: Past Medical History  Diagnosis Date  . DM   . HYPERLIPIDEMIA-MIXED   . Obesity, unspecified   . HYPERTENSION, UNSPECIFIED   . CAD (coronary artery disease)     a. Nonobstructive moderate CAD by cath - last 2012.  [name] Hypertrophic obstructive cardiomyopathy   . Complete heart block     a. s/p pacemaker later upgraded to CRT-D.  [name] CKD (chronic kidney disease)     a. Baseline Cr 1.4-1.5.  [name] Chronic systolic CHF (congestive heart failure)     a. NICM (EF 25% in 2012, 20-25% in 09/2013). b. s/p BiV-ICD (MDT).  . Colon polyps   . Ventricular tachycardia     a. EPS/RFCA of VT in 2012.  b. On amio, mexilitene. c. Last BiV-ICD gen change 05/2013 (MDT).  . Hypothyroidism   . Fatty liver disease, nonalcoholic   . BPH (benign prostatic hyperplasia)   . Habitual alcohol use     Medications:  Anti-infectives    Start     Dose/Rate Route Frequency Ordered Stop   09/02/14 1800  levofloxacin (LEVAQUIN) IVPB 750 mg     750 mg100 mL/hr over 90 Minutes Intravenous Every 48 hours 08/31/14 1752     08/31/14 1600  levofloxacin (LEVAQUIN) IVPB 750 mg     750 mg100 mL/hr over 90 Minutes Intravenous  Once 08/31/14 1552       Assessment: 58 yom presented to the ED with hypoglycemia and SOB. To start empiric levaquin for treatment of pneumonia. Pt is hypothermic, WBC is WNL and Scr is 2.51. Of note, he is on multiple QTc prolonging drugs and appears to have baseline prolongation. Will need to monitor closely.   Levaquin 11/28>>  Also ordered to continue home warfarin for hx LV thrombus. INR slightly above goal at 3.01. Levaquin may increase INR.   Goal of Therapy:  Eradication of infection INR 2-3  Plan:  1. Levaquin 750mg  IV Q48H 2. F/u renal fxn, C&S, clinical status and QTc 3. No further coumadin tonight 4. Daily INR  Lysle Pearl, PharmD, BCPS Pager # [phone] 08/31/2014 5:57 PM

## 2014-08-31 NOTE — ED Notes (Signed)
[employer] Turned up to [employer]. Patient alert and oriented. Family at the bedside.

## 2014-08-31 NOTE — Plan of Care (Signed)
Problem: Phase I Progression Outcomes Goal: O2 sats > or equal 90% or at baseline Outcome: Completed/Met Date Met:  08/31/14 Goal: Dyspnea controlled at rest Outcome: Progressing Goal: Hemodynamically stable Outcome: Progressing Goal: Flu/PneumoVaccines if indicated Outcome: Completed/Met Date Met:  08/31/14 Goal: Pain controlled Outcome: Completed/Met Date Met:  08/31/14 Goal: Progress activity as tolerated unless otherwise ordered Outcome: Progressing Goal: Discharge plan established Outcome: Progressing Goal: Tolerating diet Outcome: Completed/Met Date Met:  08/31/14

## 2014-08-31 NOTE — ED Notes (Addendum)
Phlebotomy attempted blood x1. Called [facility] to retrieve. Patient returned from Xray.

## 2014-08-31 NOTE — ED Notes (Signed)
Per EMS, Patient forgot to eat breakfast this morning and became hypoglycemic. When EMS arrived, Patient's BG was 36. Patient was given an amp of D50 and a peanut butter sandwich. CBG came up to 139. During transport, patient's CBG dropped to 62. Patient was given another amp of D50. Upon arrival, patient's CBG was 133. Patient also has had a cough for a couple weeks and complains of feeling Short of Breath upon arrival. Patient has wheezing noted. Patient has history of CHF, Pacemaker, DM, and Complete Heart BLock. Patient is alert and oriented x4. Patient has dyspnea at rest with audible wheezing noted. Patient is able to complete full sentences.

## 2014-08-31 NOTE — ED Notes (Signed)
I Stat Lactic Acid results shown to [employer] PA

## 2014-08-31 NOTE — ED Notes (Signed)
Resp Tech called to preform treatment.

## 2014-09-01 ENCOUNTER — Inpatient Hospital Stay (HOSPITAL_COMMUNITY): Payer: BC Managed Care – PPO

## 2014-09-01 DIAGNOSIS — N179 Acute kidney failure, unspecified: Secondary | ICD-10-CM | POA: Insufficient documentation

## 2014-09-01 DIAGNOSIS — I213 ST elevation (STEMI) myocardial infarction of unspecified site: Secondary | ICD-10-CM

## 2014-09-01 DIAGNOSIS — J9801 Acute bronchospasm: Secondary | ICD-10-CM

## 2014-09-01 DIAGNOSIS — I369 Nonrheumatic tricuspid valve disorder, unspecified: Secondary | ICD-10-CM

## 2014-09-01 DIAGNOSIS — D696 Thrombocytopenia, unspecified: Secondary | ICD-10-CM

## 2014-09-01 DIAGNOSIS — T68XXXD Hypothermia, subsequent encounter: Secondary | ICD-10-CM

## 2014-09-01 DIAGNOSIS — I509 Heart failure, unspecified: Secondary | ICD-10-CM

## 2014-09-01 LAB — CBC WITH DIFFERENTIAL/PLATELET
BASOS ABS: 0 10*3/uL (ref 0.0–0.1)
BASOS PCT: 0 % (ref 0–1)
Eosinophils Absolute: 0 10*3/uL (ref 0.0–0.7)
Eosinophils Relative: 0 % (ref 0–5)
HCT: 30.9 % — ABNORMAL LOW (ref 39.0–52.0)
Hemoglobin: 9.8 g/dL — ABNORMAL LOW (ref 13.0–17.0)
LYMPHS PCT: 9 % — AB (ref 12–46)
Lymphs Abs: 0.5 10*3/uL — ABNORMAL LOW (ref 0.7–4.0)
MCH: 26.3 pg (ref 26.0–34.0)
MCHC: 31.7 g/dL (ref 30.0–36.0)
MCV: 82.8 fL (ref 78.0–100.0)
Monocytes Absolute: 0.1 10*3/uL (ref 0.1–1.0)
Monocytes Relative: 2 % — ABNORMAL LOW (ref 3–12)
NEUTROS ABS: 4.7 10*3/uL (ref 1.7–7.7)
Neutrophils Relative %: 89 % — ABNORMAL HIGH (ref 43–77)
Platelets: 67 10*3/uL — ABNORMAL LOW (ref 150–400)
RBC: 3.73 MIL/uL — ABNORMAL LOW (ref 4.22–5.81)
RDW: 15.6 % — AB (ref 11.5–15.5)
WBC: 5.3 10*3/uL (ref 4.0–10.5)

## 2014-09-01 LAB — COMPREHENSIVE METABOLIC PANEL
ALBUMIN: 2.4 g/dL — AB (ref 3.5–5.2)
ALT: 47 U/L (ref 0–53)
AST: 62 U/L — AB (ref 0–37)
Alkaline Phosphatase: 113 U/L (ref 39–117)
Anion gap: 18 — ABNORMAL HIGH (ref 5–15)
BILIRUBIN TOTAL: 0.5 mg/dL (ref 0.3–1.2)
BUN: 50 mg/dL — ABNORMAL HIGH (ref 6–23)
CO2: 18 mEq/L — ABNORMAL LOW (ref 19–32)
Calcium: 8.5 mg/dL (ref 8.4–10.5)
Chloride: 101 mEq/L (ref 96–112)
Creatinine, Ser: 2.24 mg/dL — ABNORMAL HIGH (ref 0.50–1.35)
GFR calc Af Amer: 35 mL/min — ABNORMAL LOW (ref >90)
GFR, EST NON AFRICAN AMERICAN: 31 mL/min — AB (ref >90)
Glucose, Bld: 287 mg/dL — ABNORMAL HIGH (ref 70–99)
Potassium: 5.6 mEq/L — ABNORMAL HIGH (ref 3.7–5.3)
Sodium: 137 mEq/L (ref 137–147)
Total Protein: 6.4 g/dL (ref 6.0–8.3)

## 2014-09-01 LAB — CK: Total CK: 130 U/L (ref 7–232)

## 2014-09-01 LAB — IRON AND TIBC
Iron: 61 ug/dL (ref 42–135)
Saturation Ratios: 15 % — ABNORMAL LOW (ref 20–55)
TIBC: 395 ug/dL (ref 215–435)
UIBC: 334 ug/dL (ref 125–400)

## 2014-09-01 LAB — GLUCOSE, CAPILLARY
GLUCOSE-CAPILLARY: 133 mg/dL — AB (ref 70–99)
GLUCOSE-CAPILLARY: 310 mg/dL — AB (ref 70–99)
Glucose-Capillary: 455 mg/dL — ABNORMAL HIGH (ref 70–99)
Glucose-Capillary: 271 mg/dL — ABNORMAL HIGH (ref 70–99)
Glucose-Capillary: 280 mg/dL — ABNORMAL HIGH (ref 70–99)

## 2014-09-01 LAB — PROTIME-INR
INR: 3.14 — AB (ref 0.00–1.49)
Prothrombin Time: 32.5 seconds — ABNORMAL HIGH (ref 11.6–15.2)

## 2014-09-01 LAB — FERRITIN: Ferritin: 124 ng/mL (ref 22–322)

## 2014-09-01 LAB — HEMOGLOBIN A1C
Hgb A1c MFr Bld: 6.8 % — ABNORMAL HIGH (ref <5.7)
MEAN PLASMA GLUCOSE: 148 mg/dL — AB (ref <117)

## 2014-09-01 LAB — VITAMIN B12: Vitamin B-12: 509 pg/mL (ref 211–911)

## 2014-09-01 LAB — FOLATE: FOLATE: 7.5 ng/mL

## 2014-09-01 MED ORDER — LEVOFLOXACIN 750 MG PO TABS
750.0000 mg | ORAL_TABLET | Freq: Every day | ORAL | Status: DC
  Administered 2014-09-01 – 2014-09-05 (×5): 750 mg via ORAL
  Filled 2014-09-01 (×6): qty 1

## 2014-09-01 MED ORDER — DIPHENHYDRAMINE HCL 25 MG PO CAPS: 50.0000 mg | ORAL_CAPSULE | Freq: Every evening | ORAL | Status: DC | PRN

## 2014-09-01 MED ORDER — INSULIN ASPART 100 UNIT/ML ~~LOC~~ SOLN
0.0000 [IU] | Freq: Every day | SUBCUTANEOUS | Status: DC
  Administered 2014-09-01: 3 [IU] via SUBCUTANEOUS

## 2014-09-01 MED ORDER — INSULIN ASPART 100 UNIT/ML ~~LOC~~ SOLN
0.0000 [IU] | Freq: Three times a day (TID) | SUBCUTANEOUS | Status: DC
Start: 2014-09-01 — End: 2014-09-01
  Administered 2014-09-01: 12:00:00 via SUBCUTANEOUS

## 2014-09-01 MED ORDER — CARVEDILOL 6.25 MG PO TABS: 6.2500 mg | ORAL_TABLET | Freq: Two times a day (BID) | ORAL | Status: DC

## 2014-09-01 MED ORDER — INSULIN ASPART 100 UNIT/ML ~~LOC~~ SOLN
0.0000 [IU] | Freq: Three times a day (TID) | SUBCUTANEOUS | Status: DC
  Administered 2014-09-01: 11 [IU] via SUBCUTANEOUS
  Administered 2014-09-02: 15 [IU] via SUBCUTANEOUS
  Administered 2014-09-02: 20 [IU] via SUBCUTANEOUS
  Administered 2014-09-02: 7 [IU] via SUBCUTANEOUS
  Administered 2014-09-03: 3 [IU] via SUBCUTANEOUS
  Administered 2014-09-03: 7 [IU] via SUBCUTANEOUS
  Administered 2014-09-03: 4 [IU] via SUBCUTANEOUS
  Administered 2014-09-04: 7 [IU] via SUBCUTANEOUS
  Administered 2014-09-04: 4 [IU] via SUBCUTANEOUS
  Administered 2014-09-04: 7 [IU] via SUBCUTANEOUS
  Administered 2014-09-05 (×3): 3 [IU] via SUBCUTANEOUS
  Administered 2014-09-06: 4 [IU] via SUBCUTANEOUS

## 2014-09-01 MED ORDER — CARVEDILOL 12.5 MG PO TABS
12.5000 mg | ORAL_TABLET | Freq: Two times a day (BID) | ORAL | Status: DC
  Administered 2014-09-01 – 2014-09-06 (×11): 12.5 mg via ORAL
  Filled 2014-09-01 (×13): qty 1

## 2014-09-01 MED ORDER — INSULIN ASPART 100 UNIT/ML ~~LOC~~ SOLN: 0.0000 [IU] | Freq: Every day | SUBCUTANEOUS | Status: DC

## 2014-09-01 MED ORDER — INSULIN GLARGINE 100 UNIT/ML ~~LOC~~ SOLN
10.0000 [IU] | Freq: Two times a day (BID) | SUBCUTANEOUS | Status: DC
  Administered 2014-09-01: 10 [IU] via SUBCUTANEOUS
  Filled 2014-09-01 (×3): qty 0.1

## 2014-09-01 MED ORDER — INSULIN GLARGINE 100 UNIT/ML ~~LOC~~ SOLN
30.0000 [IU] | Freq: Once | SUBCUTANEOUS | Status: AC
  Administered 2014-09-01: 30 [IU] via SUBCUTANEOUS
  Filled 2014-09-01: qty 0.3

## 2014-09-01 MED ORDER — FUROSEMIDE 40 MG PO TABS
40.0000 mg | ORAL_TABLET | ORAL | Status: DC
  Administered 2014-09-01 – 2014-09-02 (×2): 40 mg via ORAL
  Filled 2014-09-01 (×3): qty 1

## 2014-09-01 MED ORDER — FUROSEMIDE 40 MG PO TABS
40.0000 mg | ORAL_TABLET | ORAL | Status: DC
  Filled 2014-09-01 (×2): qty 1

## 2014-09-01 MED ORDER — INSULIN GLARGINE 100 UNIT/ML ~~LOC~~ SOLN
30.0000 [IU] | Freq: Two times a day (BID) | SUBCUTANEOUS | Status: DC
Start: 2014-09-02 — End: 2014-09-02
  Filled 2014-09-01: qty 0.3

## 2014-09-01 NOTE — Plan of Care (Signed)
Problem: ICU Phase Progression Outcomes Goal: O2 sats trending toward baseline Outcome: Completed/Met Date Met:  09/01/14 Goal: Dyspnea controlled at rest Outcome: Completed/Met Date Met:  09/01/14 Goal: Hemodynamically stable Outcome: Completed/Met Date Met:  09/01/14 Goal: Pain controlled with appropriate interventions Outcome: Completed/Met Date Met:  09/01/14 Goal: Flu/PneumoVaccines if indicated Outcome: Completed/Met Date Met:  09/01/14 Goal: Initial discharge plan identified Outcome: Completed/Met Date Met:  09/01/14 Goal: Voiding-avoid urinary catheter unless indicated Outcome: Completed/Met Date Met:  09/01/14

## 2014-09-01 NOTE — Progress Notes (Signed)
Utilization Review Completed.   Billie Trager, RN, BSN Nurse Case Manager  

## 2014-09-01 NOTE — Progress Notes (Signed)
[facility] PROGRESS NOTE  [name] Sr. DYJ:[id number] DOB: Feb 14, 1956 DOA: 08/31/2014 PCP: [name], MD  Summary 58 year old white male with history of type 2 diabetes, heart failure, previous smoking presented with hypothermia, hypoglycemia, wheezing, thrombocytopenia, acute renal failure and possible pneumonia. Admitted to step down for possible sepsis.  Assessment/Plan:  Principal Problem:   Hypothermia:  Resolved. Pro calcitonin not very high, nontoxic appearing. TSH normal. May have been related to hypoglycemia. No evidence of septic shock. Transfer to telemetry. Active Problems:    Hypoglycemia:  None further. Suspect related to worsening renal failure. Will resume Lantus at lower dose, as blood glucoses high, and potassium slightly high. Increase sliding scale to moderate.    Chronic systolic heart failure:  No echo since last year. ProBNP lower than previous, but weight up. Chest x-ray without pulmonary edema. No rales on lung exam. Does have peripheral edema. Unclear what dry weight is, but higher than a few months ago. Will resume Lasix and check echocardiogram.    Acute renal failure:  ACE inhibitor held. Will check renal ultrasound to rule out obstruction. UA benign.      Possible community-acquired pneumonia with Wheezing:  Change levofloxacin to by mouth. Continue bronchodilators.  Urine strep pneumonia antigen negative. Legionella antigen pending.    Hyperlipidemia    Obesity    HTN (hypertension)    Automatic implantable cardioverter-defibrillator in situ    Hypothyroidism:  TSH okay. Patient's wife reports that he takes 187.5 g of Synthroid    Thrombocytopenia:  Etiology not clear. May be infection related.    Type 2 diabetes mellitus with renal manifestations: See above.    Mural thrombus of left ventricle:  Based on echocardiogram a year ago. Repeat echocardiogram pending. Will hold warfarin, as platelet count dropping. Once platelet count  stabilizes, may resume if no bleeding.    Anemia:  Check stool Hemoccults and anemia panel.  Insomnia: When necessary Benadryl.  Code Status:  full Family Communication:  Multiple at bedside 11/28  Disposition Plan:  Transfer to telemetry.  Consultants:    Procedures:     Antibiotics:  Levofloxacin 11/28  HPI/Subjective: Couldn't sleep last night. Wheezing. Still with cough. Eating okay. No bleeding.  Objective: Filed Vitals:   09/01/14 0730  BP: 142/80  Pulse: 66  Temp: 97.9 F (36.6 C)  Resp: 17    Intake/Output Summary (Last 24 hours) at 09/01/14 0804 Last data filed at 09/01/14 0600  Gross per 24 hour  Intake   1305 ml  Output   1125 ml  Net    180 ml   Filed Weights   08/31/14 1216 08/31/14 1740 09/01/14 0403  Weight: 140.161 kg (309 lb) 139.1 kg (306 lb 10.6 oz) 144 kg (317 lb 7.4 oz)    Exam:   General:  More comfortable. Alert, oriented. Watching TV. Nontoxic-appearing.  Cardiovascular: Rate rhythm without murmurs gallops rubs  Respiratory: Less rhonchi and wheeze today. No rales.  Abdomen: Obese, soft nontender nondistended  Ext: 1+ pitting edema  Basic Metabolic Panel:  Recent Labs Lab 08/31/14 1350 09/01/14 0210  NA 140 137  K 5.0 5.6*  CL 104 101  CO2 24 18*  GLUCOSE 128* 287*  BUN 45* 50*  CREATININE 2.51* 2.24*  CALCIUM 8.8 8.5   Liver Function Tests:  Recent Labs Lab 08/31/14 1350 09/01/14 0210  AST 87* 62*  ALT 49 47  ALKPHOS 124* 113  BILITOT 0.6 0.5  PROT 6.7 6.4  ALBUMIN 2.6* 2.4*   No results  for input(s): LIPASE, AMYLASE in the last 168 hours. No results for input(s): AMMONIA in the last 168 hours. CBC:  Recent Labs Lab 08/31/14 1350 09/01/14 0210  WBC 5.9 5.3  NEUTROABS 4.9 4.7  HGB 10.7* 9.8*  HCT 33.4* 30.9*  MCV 83.9 82.8  PLT 74* 67*   Cardiac Enzymes:  Recent Labs Lab 09/01/14 0210  CKTOTAL 130   BNP (last 3 results)  Recent Labs  09/29/13 0302 10/01/13 0431 08/31/14 1350   PROBNP 12088.0* 19037.0* 1597.0*   CBG:  Recent Labs Lab 08/31/14 2118 09/01/14 0751  GLUCAP 309* 310*    Recent Results (from the past 240 hour(s))  MRSA PCR Screening     Status: None   Collection Time: 08/31/14  5:46 PM  Result Value Ref Range Status   MRSA by PCR NEGATIVE NEGATIVE Final    Comment:        The GeneXpert MRSA Assay (FDA approved for NASAL specimens only), is one component of a comprehensive MRSA colonization surveillance program. It is not intended to diagnose MRSA infection nor to guide or monitor treatment for MRSA infections.      Studies: Dg Chest 2 View  08/31/2014   CLINICAL DATA:  Hyperglycemia, shortness of breath  EXAM: CHEST  2 VIEW  COMPARISON:  06/14/2014  FINDINGS: Cardiomegaly is noted. Central mild vascular congestion without convincing pulmonary edema. For leads cardiac pacemaker is unchanged in position. Mild basilar atelectasis or infiltrate.  IMPRESSION: Central mild vascular congestion without convincing pulmonary edema. Mild basilar atelectasis or infiltrate. For leads cardiac pacemaker unchanged in position.   Electronically Signed   By: Natasha MeadLiviu  Pop M.D.   On: 08/31/2014 13:43    Scheduled Meds: . albuterol      . amiodarone  200 mg Oral Once per day on Sun Sat   And  . [START ON 09/02/2014] amiodarone  400 mg Oral Once per day on Mon Tue Wed Thu Fri  . colesevelam  625 mg Oral BID WC  . insulin aspart  0-15 Units Subcutaneous TID WC  . insulin aspart  0-5 Units Subcutaneous QHS  . insulin glargine  10 Units Subcutaneous BID  . ipratropium-albuterol  3 mL Nebulization QID  . [START ON 09/02/2014] levofloxacin (LEVAQUIN) IV  750 mg Intravenous Q48H  . levothyroxine  187.5 mcg Oral QAC breakfast  . mexiletine  150 mg Oral 3 times per day  . rosuvastatin  10 mg Oral QPM  . sodium chloride  3 mL Intravenous Q12H  . Warfarin - Pharmacist Dosing Inpatient   Does not apply q1800   Continuous Infusions:   Time spent: 35  minutes  Raylie Maddison L  [facility] Pager [phone]. If 7PM-7AM, please contact night-coverage at [email or web address], password [email or web address] 09/01/2014, 8:04 AM  LOS: 1 day

## 2014-09-01 NOTE — Progress Notes (Signed)
Pharmacist Heart Failure Core Measure Documentation  Assessment: Dakota SKAHAN Sr. has an EF documented as 25% on 09/01/14 by ECHO.  Rationale: Heart failure patients with left ventricular systolic dysfunction (LVSD) and an EF < 40% should be prescribed an angiotensin converting enzyme inhibitor (ACEI) or angiotensin receptor blocker (ARB) at discharge unless a contraindication is documented in the medical record.  This patient is not currently on an ACEI or ARB for HF.  This note is being placed in the record in order to provide documentation that a contraindication to the use of these agents is present for this encounter.  ACE Inhibitor or Angiotensin Receptor Blocker is contraindicated (specify all that apply)  []   ACEI allergy AND ARB allergy []   Angioedema []   Moderate or severe aortic stenosis []   Hyperkalemia []   Hypotension []   Renal artery stenosis [x]   Worsening renal function, preexisting renal disease or dysfunction   Tad Moore, BCPS  Clinical Pharmacist Pager [phone]  09/01/2014 2:45 PM

## 2014-09-01 NOTE — Progress Notes (Signed)
Report called pt transferring to [id number] via w/c with belongings, notified wife via phone of new room number.

## 2014-09-01 NOTE — Progress Notes (Signed)
Echo Lab  2D Echocardiogram completed.  Ramzy Cappelletti L Vencil Basnett, RDCS 09/01/2014 12:04 PM

## 2014-09-02 DIAGNOSIS — I10 Essential (primary) hypertension: Secondary | ICD-10-CM

## 2014-09-02 DIAGNOSIS — Z95810 Presence of automatic (implantable) cardiac defibrillator: Secondary | ICD-10-CM

## 2014-09-02 LAB — BASIC METABOLIC PANEL
ANION GAP: 16 — AB (ref 5–15)
BUN: 63 mg/dL — ABNORMAL HIGH (ref 6–23)
CALCIUM: 8.6 mg/dL (ref 8.4–10.5)
CO2: 19 meq/L (ref 19–32)
Chloride: 100 mEq/L (ref 96–112)
Creatinine, Ser: 2.38 mg/dL — ABNORMAL HIGH (ref 0.50–1.35)
GFR calc Af Amer: 33 mL/min — ABNORMAL LOW (ref >90)
GFR calc non Af Amer: 28 mL/min — ABNORMAL LOW (ref >90)
Glucose, Bld: 257 mg/dL — ABNORMAL HIGH (ref 70–99)
POTASSIUM: 5.5 meq/L — AB (ref 3.7–5.3)
Sodium: 135 mEq/L — ABNORMAL LOW (ref 137–147)

## 2014-09-02 LAB — CBC
HCT: 29.6 % — ABNORMAL LOW (ref 39.0–52.0)
Hemoglobin: 9.4 g/dL — ABNORMAL LOW (ref 13.0–17.0)
MCH: 26.3 pg (ref 26.0–34.0)
MCHC: 31.8 g/dL (ref 30.0–36.0)
MCV: 82.9 fL (ref 78.0–100.0)
PLATELETS: 81 10*3/uL — AB (ref 150–400)
RBC: 3.57 MIL/uL — AB (ref 4.22–5.81)
RDW: 15.9 % — AB (ref 11.5–15.5)
WBC: 9.4 10*3/uL (ref 4.0–10.5)

## 2014-09-02 LAB — GLUCOSE, CAPILLARY
Glucose-Capillary: 247 mg/dL — ABNORMAL HIGH (ref 70–99)
Glucose-Capillary: 303 mg/dL — ABNORMAL HIGH (ref 70–99)
Glucose-Capillary: 361 mg/dL — ABNORMAL HIGH (ref 70–99)

## 2014-09-02 LAB — PROTIME-INR
INR: 2.6 — AB (ref 0.00–1.49)
Prothrombin Time: 28.1 seconds — ABNORMAL HIGH (ref 11.6–15.2)

## 2014-09-02 LAB — AMMONIA: Ammonia: 108 umol/L — ABNORMAL HIGH (ref 11–60)

## 2014-09-02 MED ORDER — SODIUM POLYSTYRENE SULFONATE 15 GM/60ML PO SUSP
15.0000 g | Freq: Once | ORAL | Status: AC
  Administered 2014-09-02: 15 g via ORAL
  Filled 2014-09-02: qty 60

## 2014-09-02 MED ORDER — INSULIN GLARGINE 100 UNIT/ML ~~LOC~~ SOLN
50.0000 [IU] | Freq: Two times a day (BID) | SUBCUTANEOUS | Status: DC
  Administered 2014-09-02 – 2014-09-06 (×9): 50 [IU] via SUBCUTANEOUS
  Filled 2014-09-02 (×13): qty 0.5

## 2014-09-02 MED ORDER — LACTULOSE 10 GM/15ML PO SOLN
20.0000 g | Freq: Three times a day (TID) | ORAL | Status: DC
  Administered 2014-09-02 – 2014-09-06 (×4): 20 g via ORAL
  Filled 2014-09-02 (×13): qty 30

## 2014-09-02 MED ORDER — IPRATROPIUM-ALBUTEROL 0.5-2.5 (3) MG/3ML IN SOLN: 3.0000 mL | Freq: Four times a day (QID) | RESPIRATORY_TRACT | Status: DC | PRN

## 2014-09-02 MED ORDER — FUROSEMIDE 10 MG/ML IJ SOLN
40.0000 mg | Freq: Every day | INTRAMUSCULAR | Status: DC
  Administered 2014-09-02 – 2014-09-06 (×5): 40 mg via INTRAVENOUS
  Filled 2014-09-02 (×5): qty 4

## 2014-09-02 NOTE — Plan of Care (Signed)
Problem: Phase I Progression Outcomes Goal: Dyspnea controlled at rest Outcome: Completed/Met Date Met:  09/02/14 Goal: Hemodynamically stable Outcome: Completed/Met Date Met:  09/02/14 Goal: Progress activity as tolerated unless otherwise ordered Outcome: Completed/Met Date Met:  09/02/14 Goal: Discharge plan established Outcome: Progressing     

## 2014-09-02 NOTE — Progress Notes (Signed)
[facility] PROGRESS NOTE  Dakota BrookDonald R Whicker Sr. NAT:[id number] DOB: 07/05/1956 DOA: 08/31/2014 PCP: Dakota HeapMOORE, Halil, MD   Subjective:  Patient feels okay, denies major complaints. Feels his abdomen is bloated, slightly short of breath.  Summary 58 year old white male with history of type 2 diabetes, heart failure, previous smoking presented with hypothermia, hypoglycemia, wheezing, thrombocytopenia, acute renal failure and possible pneumonia. Admitted to step down for possible sepsis.  Assessment/Plan: Principal Problem:   Hypothermia Active Problems:   Hyperlipidemia   Obesity   HTN (hypertension)   Automatic implantable cardioverter-defibrillator in situ   Hypothyroidism (acquired)   Acute renal failure   Type 2 diabetes mellitus with renal manifestations, controlled   Mural thrombus of left ventricle   Wheezing   Chronic systolic heart failure   Hypoglycemia   Anemia   Thrombocytopenia   Acute renal failure syndrome   Bronchospasm, acute   Congestive heart disease  Possible community-acquired pneumonia with Wheezing:   Presented with hypothermia and wheezing, chest x-ray showed mild atelectasis versus infiltrates.  Change levofloxacin to by mouth. Continue bronchodilators.   Urine strep pneumonia antigen negative. Legionella antigen pending.  Acute on chronic systolic heart failure:   No echo since last year. ProBNP lower than previous, but weight up.  hest x-ray without pulmonary edema. No rales on lung exam. Does have peripheral edema.  Patient does have +2 edema bilaterally as well as findings of mild ascites on ultrasound. Restart Lasix and IV form, reacting might worsen, that happened will consider calling nephrology.  Hypothermia:  Resolved. Pro calcitonin not very high, nontoxic appearing.  TSH normal. May have been related to hypoglycemia. No evidence of septic shock. Transfer to telemetry.  Hypoglycemia:   None further. Suspect related to worsening  renal failure.  Lantus restarted at 30 units twice a day initially, increase it today to 50 units twice a day..  Acute renal failure:   ACE inhibitor held. Will check renal ultrasound to rule out obstruction. UA benign.     Automatic implantable cardioverter-defibrillator in situ Appears to be stable no issues.  Hypothyroidism:   TSH okay. Patient's wife reports that he takes 187.5 g of Synthroid  Thrombocytopenia:   Etiology not clear. May be infection related, Improving slightly. Check AST/ALT, albumin to rule out congestive hepatopathy   Type 2 diabetes mellitus with renal manifestations: See above.  Mural thrombus of left ventricle:   Based on echocardiogram a year ago. Repeat echocardiogram pending.  Restart warfarin, platelets above 50 and the numbers trending up.   Anemia:   Check stool Hemoccults and anemia panel.  Hyperkalemia Slight hyperkalemia will give dose of Kayexalate.   Code Status:  full Family Communication:  Multiple at bedside 11/28  Disposition Plan:  Transfer to telemetry.  Consultants:    Procedures:     Antibiotics:  Levofloxacin 11/28  HPI/Subjective: Couldn't sleep last night. Wheezing. Still with cough. Eating okay. No bleeding.  Objective: Filed Vitals:   09/02/14 1055  BP: 101/51  Pulse: 61  Temp:   Resp: 20    Intake/Output Summary (Last 24 hours) at 09/02/14 1159 Last data filed at 09/02/14 1151  Gross per 24 hour  Intake   1422 ml  Output   1300 ml  Net    122 ml   Filed Weights   09/01/14 1048 09/01/14 1050 09/02/14 0523  Weight: 145.968 kg (321 lb 12.8 oz) 145.968 kg (321 lb 12.8 oz) 147.238 kg (324 lb 9.6 oz)    Exam:   General:  More  comfortable. Alert, oriented. Watching TV. Nontoxic-appearing.  Cardiovascular: Rate rhythm without murmurs gallops rubs  Respiratory: Less rhonchi and wheeze today. No rales.  Abdomen: Obese, soft nontender nondistended  Ext: 1+ pitting edema  Basic Metabolic  Panel:  Recent Labs Lab 08/31/14 1350 09/01/14 0210 09/02/14 0419  NA 140 137 135*  K 5.0 5.6* 5.5*  CL 104 101 100  CO2 24 18* 19  GLUCOSE 128* 287* 257*  BUN 45* 50* 63*  CREATININE 2.51* 2.24* 2.38*  CALCIUM 8.8 8.5 8.6   Liver Function Tests:  Recent Labs Lab 08/31/14 1350 09/01/14 0210  AST 87* 62*  ALT 49 47  ALKPHOS 124* 113  BILITOT 0.6 0.5  PROT 6.7 6.4  ALBUMIN 2.6* 2.4*   No results for input(s): LIPASE, AMYLASE in the last 168 hours. No results for input(s): AMMONIA in the last 168 hours. CBC:  Recent Labs Lab 08/31/14 1350 09/01/14 0210 09/02/14 0419  WBC 5.9 5.3 9.4  NEUTROABS 4.9 4.7  --   HGB 10.7* 9.8* 9.4*  HCT 33.4* 30.9* 29.6*  MCV 83.9 82.8 82.9  PLT 74* 67* 81*   Cardiac Enzymes:  Recent Labs Lab 09/01/14 0210  CKTOTAL 130   BNP (last 3 results)  Recent Labs  09/29/13 0302 10/01/13 0431 08/31/14 1350  PROBNP 12088.0* 19037.0* 1597.0*   CBG:  Recent Labs Lab 09/01/14 1059 09/01/14 1700 09/01/14 2108 09/02/14 0605 09/02/14 1149  GLUCAP 455* 271* 280* 247* 303*    Recent Results (from the past 240 hour(s))  MRSA PCR Screening     Status: None   Collection Time: 08/31/14  5:46 PM  Result Value Ref Range Status   MRSA by PCR NEGATIVE NEGATIVE Final    Comment:        The GeneXpert MRSA Assay (FDA approved for NASAL specimens only), is one component of a comprehensive MRSA colonization surveillance program. It is not intended to diagnose MRSA infection nor to guide or monitor treatment for MRSA infections.   Blood culture (routine x 2)     Status: None (Preliminary result)   Collection Time: 08/31/14  6:55 PM  Result Value Ref Range Status   Specimen Description BLOOD LEFT ARM  Final   Special Requests BOTTLES DRAWN AEROBIC AND ANAEROBIC 8CC  Final   Culture  Setup Time   Final    09/01/2014 01:01 Performed at [facility]    Culture   Final           BLOOD CULTURE RECEIVED NO GROWTH TO DATE  CULTURE WILL BE HELD FOR 5 DAYS BEFORE ISSUING A FINAL NEGATIVE REPORT Performed at [facility]    Report Status PENDING  Incomplete  Blood culture (routine x 2)     Status: None (Preliminary result)   Collection Time: 08/31/14  7:05 PM  Result Value Ref Range Status   Specimen Description BLOOD LEFT HAND  Final   Special Requests BOTTLES DRAWN AEROBIC AND ANAEROBIC 10CC  Final   Culture  Setup Time   Final    09/01/2014 01:02 Performed at [facility]    Culture   Final           BLOOD CULTURE RECEIVED NO GROWTH TO DATE CULTURE WILL BE HELD FOR 5 DAYS BEFORE ISSUING A FINAL NEGATIVE REPORT Performed at [facility]    Report Status PENDING  Incomplete     Studies: Dg Chest 2 View  08/31/2014   CLINICAL DATA:  Hyperglycemia, shortness of breath  EXAM:  CHEST  2 VIEW  COMPARISON:  06/14/2014  FINDINGS: Cardiomegaly is noted. Central mild vascular congestion without convincing pulmonary edema. For leads cardiac pacemaker is unchanged in position. Mild basilar atelectasis or infiltrate.  IMPRESSION: Central mild vascular congestion without convincing pulmonary edema. Mild basilar atelectasis or infiltrate. For leads cardiac pacemaker unchanged in position.   Electronically Signed   By: Natasha Mead M.D.   On: 08/31/2014 13:43   US Renal  09/01/2014   CLINICAL DATA:  Acute renal failure, diabetes, hypertension  EXAM: RENAL/URINARY TRACT ULTRASOUND COMPLETE  COMPARISON:  06/11/2013  FINDINGS: Right Kidney:  Length: 11.2 cm. Echogenicity within normal limits. No mass or hydronephrosis visualized.  Left Kidney:  Length: 10.8 cm. Echogenicity within normal limits. No mass or hydronephrosis visualized.  Bladder:  Appears normal for degree of bladder distention.  Trace perihepatic ascites. Incidental note made of splenomegaly, 14.9 x 6.2 x 17 cm ( 821 cc) with small accessory splenules.  IMPRESSION: 1. Negative for hydronephrosis or other focal renal abnormality. 2. Trace  amount of perihepatic ascites and splenomegaly incidentally noted.   Electronically Signed   By: Oley Balm M.D.   On: 09/01/2014 15:00    Scheduled Meds: . amiodarone  200 mg Oral Once per day on Sun Sat   And  . amiodarone  400 mg Oral Once per day on Mon Tue Wed Thu Fri  . carvedilol  12.5 mg Oral BID WC  . colesevelam  625 mg Oral BID WC  . insulin aspart  0-20 Units Subcutaneous TID WC  . insulin glargine  50 Units Subcutaneous BID  . ipratropium-albuterol  3 mL Nebulization QID  . levofloxacin  750 mg Oral QHS  . levothyroxine  187.5 mcg Oral QAC breakfast  . mexiletine  150 mg Oral 3 times per day  . rosuvastatin  10 mg Oral QPM  . sodium chloride  3 mL Intravenous Q12H   Continuous Infusions:   Time spent: 35 minutes  [facility]  [facility] Pager [phone]. If 7PM-7AM, please contact night-coverage at [email or web address], password [facility] 09/02/2014, 11:59 AM  LOS: 2 days

## 2014-09-02 NOTE — Progress Notes (Signed)
1858 Dr. Arthor Captain responded with orders given

## 2014-09-02 NOTE — Progress Notes (Signed)
Placed a call to Dr. Arthor Captain re: ammonia level =108

## 2014-09-03 LAB — CBC
HCT: 29.3 % — ABNORMAL LOW (ref 39.0–52.0)
HEMOGLOBIN: 9.2 g/dL — AB (ref 13.0–17.0)
MCH: 26.2 pg (ref 26.0–34.0)
MCHC: 31.4 g/dL (ref 30.0–36.0)
MCV: 83.5 fL (ref 78.0–100.0)
PLATELETS: 77 10*3/uL — AB (ref 150–400)
RBC: 3.51 MIL/uL — ABNORMAL LOW (ref 4.22–5.81)
RDW: 15.9 % — AB (ref 11.5–15.5)
WBC: 6.6 10*3/uL (ref 4.0–10.5)

## 2014-09-03 LAB — HEPATITIS PANEL, ACUTE
HCV AB: NEGATIVE
HEP B S AG: NEGATIVE
Hep A IgM: NONREACTIVE
Hep B C IgM: NONREACTIVE

## 2014-09-03 LAB — PROTIME-INR
INR: 2.11 — AB (ref 0.00–1.49)
Prothrombin Time: 23.8 seconds — ABNORMAL HIGH (ref 11.6–15.2)

## 2014-09-03 LAB — COMPREHENSIVE METABOLIC PANEL
ALBUMIN: 2.6 g/dL — AB (ref 3.5–5.2)
ALT: 55 U/L — AB (ref 0–53)
AST: 89 U/L — ABNORMAL HIGH (ref 0–37)
Alkaline Phosphatase: 122 U/L — ABNORMAL HIGH (ref 39–117)
Anion gap: 17 — ABNORMAL HIGH (ref 5–15)
BUN: 61 mg/dL — ABNORMAL HIGH (ref 6–23)
CO2: 20 mEq/L (ref 19–32)
Calcium: 8.6 mg/dL (ref 8.4–10.5)
Chloride: 100 mEq/L (ref 96–112)
Creatinine, Ser: 2.13 mg/dL — ABNORMAL HIGH (ref 0.50–1.35)
GFR calc Af Amer: 38 mL/min — ABNORMAL LOW (ref >90)
GFR calc non Af Amer: 32 mL/min — ABNORMAL LOW (ref >90)
GLUCOSE: 261 mg/dL — AB (ref 70–99)
POTASSIUM: 4.6 meq/L (ref 3.7–5.3)
SODIUM: 137 meq/L (ref 137–147)
TOTAL PROTEIN: 6.3 g/dL (ref 6.0–8.3)
Total Bilirubin: 0.5 mg/dL (ref 0.3–1.2)

## 2014-09-03 LAB — GLUCOSE, CAPILLARY
GLUCOSE-CAPILLARY: 145 mg/dL — AB (ref 70–99)
GLUCOSE-CAPILLARY: 263 mg/dL — AB (ref 70–99)
Glucose-Capillary: 186 mg/dL — ABNORMAL HIGH (ref 70–99)
Glucose-Capillary: 215 mg/dL — ABNORMAL HIGH (ref 70–99)
Glucose-Capillary: 178 mg/dL — ABNORMAL HIGH (ref 70–99)

## 2014-09-03 LAB — MAGNESIUM: Magnesium: 1.7 mg/dL (ref 1.5–2.5)

## 2014-09-03 LAB — LEGIONELLA ANTIGEN, URINE

## 2014-09-03 LAB — PRO B NATRIURETIC PEPTIDE: PRO B NATRI PEPTIDE: 1552 pg/mL — AB (ref 0–125)

## 2014-09-03 NOTE — Consult Note (Signed)
Subjective:   HPI  The patient is a 58 year old male with multiple medical problems as described below. He was admitted to the hospital after being brought to the emergency room because of lethargy and he was found to have a blood sugar of 30.  GI consult was requested because there was concern that he could have underlying liver disease and perhaps cirrhosis. This was suspected because he had a low platelet count of 77,000, alkaline phosphatase was slightly elevated at 122, AST 89, ALT 55. His ammonia level was checked and was elevated at 108. His prothrombin time is elevated at 23.8 but he is on Coumadin. The patient states that he was told in the past that he had fatty liver. He had an ultrasound last year which showed this. He denies drinking alcohol. He has never had hepatitis. He had a hepatitis profile which was negative. He is obese, and states he is always had a big belly. There is no family history of liver disease to his knowledge.  Review of Systems He denies chest pain but does complain of some shortness of breath.  Past Medical History  Diagnosis Date  . DM   . HYPERLIPIDEMIA-MIXED   . Obesity, unspecified   . HYPERTENSION, UNSPECIFIED   . CAD (coronary artery disease)     a. Nonobstructive moderate CAD by cath - last 2012.  [name] Hypertrophic obstructive cardiomyopathy   . Complete heart block     a. s/p pacemaker later upgraded to CRT-D.  [name] CKD (chronic kidney disease)     a. Baseline Cr 1.4-1.5.  [name] Chronic systolic CHF (congestive heart failure)     a. NICM (EF 25% in 2012, 20-25% in 09/2013). b. s/p BiV-ICD (MDT).  . Colon polyps   . Ventricular tachycardia     a. EPS/RFCA of VT in 2012. b. On amio, mexilitene. c. Last BiV-ICD gen change 05/2013 (MDT).  . Hypothyroidism   . Fatty liver disease, nonalcoholic   . BPH (benign prostatic hyperplasia)   . Habitual alcohol use    Past Surgical History  Procedure Laterality Date  . Pacemaker insertion      medtronic  .  Cardiac catheterization    . Facial reconstruction surgery      after trauma in 2006   History   Social History  . Marital Status: Married    Spouse Name: N/A    Number of Children: N/A  . Years of Education: N/A   Occupational History  . Not on file.   Social History Main Topics  . Smoking status: Former Smoker    Types: Cigarettes    Quit date: 10/04/1968  . Smokeless tobacco: Never Used     Comment: 30+ yrs  . Alcohol Use: Yes     Comment: about 1 cup of liquor daily (straight liquor - not mixed)  . Drug Use: No  . Sexual Activity: Not on file   Other Topics Concern  . Not on file   Social History Narrative   family history includes Diabetic kidney disease in his father. There is no history of Coronary artery disease. Current facility-administered medications: acetaminophen (TYLENOL) tablet 650 mg, 650 mg, Oral, Q6H PRN **OR** acetaminophen (TYLENOL) suppository 650 mg, 650 mg, Rectal, Q6H PRN, Christiane Ha, MD;  albuterol (PROVENTIL) (2.5 MG/3ML) 0.083% nebulizer solution 2.5 mg, 2.5 mg, Nebulization, Q2H PRN, Christiane Ha, MD, 2.5 mg at 08/31/14 2303 alum & mag hydroxide-simeth (MAALOX/MYLANTA) 200-200-20 MG/5ML suspension 30 mL, 30 mL, Oral, Q6H PRN, Corinna L  Lendell Caprice, MD;  amiodarone (PACERONE) tablet 200 mg, 200 mg, Oral, Once per day on Sun Sat, 200 mg at 09/01/14 0920 **AND** amiodarone (PACERONE) tablet 400 mg, 400 mg, Oral, Once per day on Mon Tue Wed Thu Fri, Corinna L Sullivan, MD, 400 mg at 09/03/14 1055 carvedilol (COREG) tablet 12.5 mg, 12.5 mg, Oral, BID WC, Christiane Ha, MD, 12.5 mg at 09/03/14 0900;  colesevelam [facility]) tablet 625 mg, 625 mg, Oral, BID WC, Christiane Ha, MD, 625 mg at 09/03/14 0800;  diphenhydrAMINE (BENADRYL) capsule 50 mg, 50 mg, Oral, QHS PRN, Christiane Ha, MD;  furosemide (LASIX) injection 40 mg, 40 mg, Intravenous, Daily, Clydia Llano, MD, 40 mg at 09/03/14 1056 guaiFENesin-dextromethorphan (ROBITUSSIN DM)  100-10 MG/5ML syrup 5 mL, 5 mL, Oral, Q4H PRN, Christiane Ha, MD;  insulin aspart (novoLOG) injection 0-20 Units, 0-20 Units, Subcutaneous, TID WC, Christiane Ha, MD, 4 Units at 09/03/14 1257;  insulin glargine (LANTUS) injection 50 Units, 50 Units, Subcutaneous, BID, Clydia Llano, MD, 50 Units at 09/03/14 1056 ipratropium-albuterol (DUONEB) 0.5-2.5 (3) MG/3ML nebulizer solution 3 mL, 3 mL, Nebulization, Q6H PRN, Christiane Ha, MD;  lactulose (CHRONULAC) 10 GM/15ML solution 20 g, 20 g, Oral, TID, Clydia Llano, MD, 20 g at 09/03/14 1509;  levofloxacin (LEVAQUIN) tablet 750 mg, 750 mg, Oral, QHS, Christiane Ha, MD, 750 mg at 09/02/14 2317 levothyroxine (SYNTHROID, LEVOTHROID) tablet 187.5 mcg, 187.5 mcg, Oral, QAC breakfast, Christiane Ha, MD, 187.5 mcg at 09/03/14 1052;  magnesium hydroxide (MILK OF MAGNESIA) suspension 30 mL, 30 mL, Oral, Daily PRN, Christiane Ha, MD;  mexiletine (MEXITIL) capsule 150 mg, 150 mg, Oral, 3 times per day, Christiane Ha, MD, 150 mg at 09/03/14 1500 ondansetron (ZOFRAN) tablet 4 mg, 4 mg, Oral, Q6H PRN **OR** ondansetron (ZOFRAN) injection 4 mg, 4 mg, Intravenous, Q6H PRN, Christiane Ha, MD;  rosuvastatin (CRESTOR) tablet 10 mg, 10 mg, Oral, QPM, Christiane Ha, MD, 10 mg at 09/02/14 1749;  sodium chloride 0.9 % injection 3 mL, 3 mL, Intravenous, Q12H, Christiane Ha, MD, 3 mL at 09/03/14 1057 Allergies  Allergen Reactions  . Ace Inhibitors     AVOID ALL  BECAUSE OF HEART DISEASE   . Nitroglycerin Other (See Comments)    Reaction unknown  . Procan Sr [Procainamide Hcl]     PT. HAS HEART DISEASE CALLED HYPERTHROPHIC CARDIOMYOPATHY  . Quinidine     PT. HAS HEART DISEASE CALLED HYPERTHROPHIC CARDIOMYOPATHY       Objective:     BP 122/62 mmHg  Pulse 62  Temp(Src) 97.4 F (36.3 C) (Oral)  Resp 18  Ht 6\' 1"  (1.854 m)  Wt 144.335 kg (318 lb 3.2 oz)  BMI 41.99 kg/m2  SpO2 100%  He is in no  distress  Nonicteric  Heart regular rhythm no murmurs  Lungs with bilateral wheezes  Abdomen: Bowel sounds normal, soft, obese, question ascites although not sure due to significant obesity  Laboratory No components found for: D1    Assessment:     #1. Fatty liver this was seen on prior ultrasound.  #2. Elevated liver enzymes. This could be due to fatty liver it could also be due to underlying cirrhosis of the liver.      Plan:     CT abdomen to see if he's got ascites and also to look at the contour of the liver and see if it looks nodule  which would support cirrhosis. Continue lactulose in view of elevated ammonia  level.

## 2014-09-03 NOTE — Progress Notes (Signed)
[facility] PROGRESS NOTE  [name] Sr. YNW:[id number] DOB: [birth year] DOA: 08/31/2014 PCP: [name], MD   Subjective:  Denies any significant complaints, no SOB. Still has +1 to +2 pedal edema bilaterally.  Summary 58 year old white male with history of type 2 diabetes, heart failure, previous smoking presented with hypothermia, hypoglycemia, wheezing, thrombocytopenia, acute renal failure and possible pneumonia. Admitted to step down for possible sepsis.  Assessment/Plan: Principal Problem:   Hypothermia Active Problems:   Hyperlipidemia   Obesity   HTN (hypertension)   Automatic implantable cardioverter-defibrillator in situ   Hypothyroidism (acquired)   Acute renal failure   Type 2 diabetes mellitus with renal manifestations, controlled   Mural thrombus of left ventricle   Wheezing   Chronic systolic heart failure   Hypoglycemia   Anemia   Thrombocytopenia   Acute renal failure syndrome   Bronchospasm, acute   Congestive heart disease    Acute on chronic systolic heart failure:   No echo since last year. ProBNP lower than previous, but weight up.  hest x-ray without pulmonary edema. No rales on lung exam. Does have peripheral edema.  Patient does have +2 edema bilaterally as well as findings of mild ascites on ultrasound. IV Lasix restarted.  Question of hepatic cirrhosis  Has significant thrombocytopenia with elevated AST/ALT and low albumin. Elevated ammonia level. Cannot rule out cardiac cirrhosis from congestive hepatopathy, GI consulted.  Possible community-acquired pneumonia with Wheezing:   Presented with hypothermia and wheezing, chest x-ray showed mild atelectasis versus infiltrates.  Change levofloxacin to by mouth. Continue bronchodilators.   No wbc's or fever I will discontinue antibiotics.  Hypothermia:  Resolved. Pro calcitonin not very high, nontoxic appearing.  TSH normal. May have been related to hypoglycemia. No evidence of  septic shock. Transfer to telemetry.  Hypoglycemia:   None further. Suspect related to worsening renal failure.  Lantus restarted at 30 units twice a day initially, increase it today to 50 units twice a day..  Acute renal failure:   ACE inhibitor held. Will check renal ultrasound to rule out obstruction. UA benign.     Automatic implantable cardioverter-defibrillator in situ Appears to be stable no issues.  Hypothyroidism:   TSH okay. Patient's wife reports that he takes 187.5 g of Synthroid  Type 2 diabetes mellitus with renal manifestations: See above.  Mural thrombus of left ventricle:   Based on echocardiogram a year ago. Repeat echocardiogram pending.  Restart warfarin, platelets above 50 and the numbers trending up.   Anemia:   Check stool Hemoccults and anemia panel.  Hyperkalemia Slight hyperkalemia will give dose of Kayexalate.   Code Status:  full Family Communication:  Multiple at bedside 11/28  Disposition Plan:  Transfer to telemetry.  Consultants:    Procedures:     Antibiotics:  Levofloxacin 11/28 discontinued on 12/1    Objective: Filed Vitals:   09/03/14 1102  BP: 123/51  Pulse: 65  Temp:   Resp: 18    Intake/Output Summary (Last 24 hours) at 09/03/14 1235 Last data filed at 09/03/14 1014  Gross per 24 hour  Intake   1263 ml  Output   1525 ml  Net   -262 ml   Filed Weights   09/01/14 1050 09/02/14 0523 09/03/14 0500  Weight: 145.968 kg (321 lb 12.8 oz) 147.238 kg (324 lb 9.6 oz) 144.335 kg (318 lb 3.2 oz)    Exam:   General:  More comfortable. Alert, oriented. Watching TV. Nontoxic-appearing.  Cardiovascular: Rate rhythm without murmurs gallops rubs  Respiratory: Less rhonchi and wheeze today. No rales.  Abdomen: Obese, soft nontender nondistended  Ext: 1+ pitting edema  Basic Metabolic Panel:  Recent Labs Lab 08/31/14 1350 09/01/14 0210 09/02/14 0419 09/03/14 0255  NA 140 137 135* 137  K 5.0 5.6* 5.5* 4.6  CL  104 101 100 100  CO2 24 18* 19 20  GLUCOSE 128* 287* 257* 261*  BUN 45* 50* 63* 61*  CREATININE 2.51* 2.24* 2.38* 2.13*  CALCIUM 8.8 8.5 8.6 8.6  MG  --   --   --  1.7   Liver Function Tests:  Recent Labs Lab 08/31/14 1350 09/01/14 0210 09/03/14 0255  AST 87* 62* 89*  ALT 49 47 55*  ALKPHOS 124* 113 122*  BILITOT 0.6 0.5 0.5  PROT 6.7 6.4 6.3  ALBUMIN 2.6* 2.4* 2.6*   No results for input(s): LIPASE, AMYLASE in the last 168 hours.  Recent Labs Lab 09/02/14 1336  AMMONIA 108*   CBC:  Recent Labs Lab 08/31/14 1350 09/01/14 0210 09/02/14 0419 09/03/14 0255  WBC 5.9 5.3 9.4 6.6  NEUTROABS 4.9 4.7  --   --   HGB 10.7* 9.8* 9.4* 9.2*  HCT 33.4* 30.9* 29.6* 29.3*  MCV 83.9 82.8 82.9 83.5  PLT 74* 67* 81* 77*   Cardiac Enzymes:  Recent Labs Lab 09/01/14 0210  CKTOTAL 130   BNP (last 3 results)  Recent Labs  10/01/13 0431 08/31/14 1350 09/03/14 0255  PROBNP 19037.0* 1597.0* 1552.0*   CBG:  Recent Labs Lab 09/02/14 1149 09/02/14 1648 09/02/14 2158 09/03/14 0617 09/03/14 1102  GLUCAP 303* 361* 186* 215* 178*    Recent Results (from the past 240 hour(s))  MRSA PCR Screening     Status: None   Collection Time: 08/31/14  5:46 PM  Result Value Ref Range Status   MRSA by PCR NEGATIVE NEGATIVE Final    Comment:        The GeneXpert MRSA Assay (FDA approved for NASAL specimens only), is one component of a comprehensive MRSA colonization surveillance program. It is not intended to diagnose MRSA infection nor to guide or monitor treatment for MRSA infections.   Blood culture (routine x 2)     Status: None (Preliminary result)   Collection Time: 08/31/14  6:55 PM  Result Value Ref Range Status   Specimen Description BLOOD LEFT ARM  Final   Special Requests BOTTLES DRAWN AEROBIC AND ANAEROBIC 8CC  Final   Culture  Setup Time   Final    09/01/2014 01:01 Performed at [facility]    Culture   Final           BLOOD CULTURE RECEIVED NO  GROWTH TO DATE CULTURE WILL BE HELD FOR 5 DAYS BEFORE ISSUING A FINAL NEGATIVE REPORT Performed at [facility]    Report Status PENDING  Incomplete  Blood culture (routine x 2)     Status: None (Preliminary result)   Collection Time: 08/31/14  7:05 PM  Result Value Ref Range Status   Specimen Description BLOOD LEFT HAND  Final   Special Requests BOTTLES DRAWN AEROBIC AND ANAEROBIC 10CC  Final   Culture  Setup Time   Final    09/01/2014 01:02 Performed at [facility]    Culture   Final           BLOOD CULTURE RECEIVED NO GROWTH TO DATE CULTURE WILL BE HELD FOR 5 DAYS BEFORE ISSUING A FINAL NEGATIVE REPORT Performed at [facility]    Report  Status PENDING  Incomplete     Studies: [place] Renal  09/01/2014   CLINICAL DATA:  Acute renal failure, diabetes, hypertension  EXAM: RENAL/URINARY TRACT ULTRASOUND COMPLETE  COMPARISON:  06/11/2013  FINDINGS: Right Kidney:  Length: 11.2 cm. Echogenicity within normal limits. No mass or hydronephrosis visualized.  Left Kidney:  Length: 10.8 cm. Echogenicity within normal limits. No mass or hydronephrosis visualized.  Bladder:  Appears normal for degree of bladder distention.  Trace perihepatic ascites. Incidental note made of splenomegaly, 14.9 x 6.2 x 17 cm ( 821 cc) with small accessory splenules.  IMPRESSION: 1. Negative for hydronephrosis or other focal renal abnormality. 2. Trace amount of perihepatic ascites and splenomegaly incidentally noted.   Electronically Signed   By: Oley Balmaniel  Hassell M.D.   On: 09/01/2014 15:00    Scheduled Meds: . amiodarone  200 mg Oral Once per day on Sun Sat   And  . amiodarone  400 mg Oral Once per day on Mon Tue Wed Thu Fri  . carvedilol  12.5 mg Oral BID WC  . colesevelam  625 mg Oral BID WC  . furosemide  40 mg Intravenous Daily  . insulin aspart  0-20 Units Subcutaneous TID WC  . insulin glargine  50 Units Subcutaneous BID  . lactulose  20 g Oral TID  . levofloxacin  750 mg Oral QHS  .  levothyroxine  187.5 mcg Oral QAC breakfast  . mexiletine  150 mg Oral 3 times per day  . rosuvastatin  10 mg Oral QPM  . sodium chloride  3 mL Intravenous Q12H   Continuous Infusions:   Time spent: 35 minutes  [facility] HospitalELMAHI,Masahiro Iglesia A  [facility] Pager [phone]. If 7PM-7AM, please contact night-coverage at [email or web address], password [email or web address] 09/03/2014, 12:35 PM  LOS: 3 days

## 2014-09-03 NOTE — Plan of Care (Signed)
Problem: Phase II Progression Outcomes Goal: Activity at appropriate level-compared to baseline (UP IN CHAIR FOR HEMODIALYSIS)  Outcome: Adequate for Discharge Goal: Tolerating diet Outcome: Completed/Met Date Met:  09/03/14

## 2014-09-03 NOTE — Progress Notes (Signed)
Inpatient Diabetes Program Recommendations  AACE/ADA: New Consensus Statement on Inpatient Glycemic Control (2013)  Target Ranges:  Prepandial:   less than 140 mg/dL      Peak postprandial:   less than 180 mg/dL (1-2 hours)      Critically ill patients:  140 - 180 mg/dL    Inpatient Diabetes Program Recommendations Insulin - Basal: Fastings in general are high. Recommend increase in lantus/llevemir to 55 in sm snf 50 at HS Insulin - Meal Coverage: Pt takes 16 units meal coverage with largest meal of the day. Would recommend adding some novolog meal coverage, 4 units tidwc   Adding meal coverage of 4 units tidwc-may help alleviate need for high doses of correction insulin (safer).  Thank you, Lenor Coffin, RN, CNS, Diabetes Coordinator [phone])

## 2014-09-03 NOTE — Plan of Care (Signed)
Problem: Phase I Progression Outcomes Goal: Discharge plan established Outcome: Progressing  Problem: Phase II Progression Outcomes Goal: O2 sats > equal to 90% on RA or at baseline Outcome: Completed/Met Date Met:  09/03/14 Goal: Pain controlled on oral analgesia Outcome: Completed/Met Date Met:  09/03/14 Goal: Dyspnea controlled w/progressive activity Outcome: Completed/Met Date Met:  09/03/14 Goal: ADLs completed with minimal assistance Outcome: Completed/Met Date Met:  09/03/14

## 2014-09-03 NOTE — Evaluation (Signed)
Physical Therapy Evaluation Patient Details Name: Dakota CORRIS Sr. MRN: [id number] DOB: [birth year] Today's Date: 09/03/2014   History of Present Illness  58 year old white male with history of type 2 diabetes, heart failure, previous smoking presented with hypothermia, hypoglycemia, wheezing, thrombocytopenia, acute renal failure and possible pneumonia.  Clinical Impression  Pt doing well with mobility and no further PT needed.  Ready for dc from PT standpoint.      Follow Up Recommendations No PT follow up    Equipment Recommendations  None recommended by PT    Recommendations for Other Services       Precautions / Restrictions Precautions Precautions: None      Mobility  Bed Mobility                  Transfers Overall transfer level: Independent                  Ambulation/Gait Ambulation/Gait assistance: Independent Ambulation Distance (Feet): 225 Feet Assistive device: None Gait Pattern/deviations: Wide base of support        Stairs            Wheelchair Mobility    Modified Rankin (Stroke Patients Only)       Balance Overall balance assessment: No apparent balance deficits (not formally assessed)                                           Pertinent Vitals/Pain Pain Assessment: No/denies pain    Home Living Family/patient expects to be discharged to:: Private residence Living Arrangements: Spouse/significant other Available Help at Discharge: Family Type of Home: House       Home Layout: One level Home Equipment: None      Prior Function Level of Independence: Independent               Hand Dominance        Extremity/Trunk Assessment   Upper Extremity Assessment: Overall WFL for tasks assessed           Lower Extremity Assessment: Overall WFL for tasks assessed         Communication   Communication: No difficulties  Cognition Arousal/Alertness: Awake/alert Behavior During  Therapy: WFL for tasks assessed/performed Overall Cognitive Status: Within Functional Limits for tasks assessed                      General Comments      Exercises        Assessment/Plan    PT Assessment Patent does not need any further PT services  PT Diagnosis Difficulty walking   PT Problem List    PT Treatment Interventions     PT Goals (Current goals can be found in the Care Plan section) Acute Rehab PT Goals PT Goal Formulation: All assessment and education complete, DC therapy    Frequency     Barriers to discharge        Co-evaluation               End of Session   Activity Tolerance: Patient tolerated treatment well Patient left: in chair;with family/visitor present           Time: 1140-1147 PT Time Calculation (min) (ACUTE ONLY): 7 min   Charges:   PT Evaluation $Initial PT Evaluation Tier I: 1 Procedure     PT G Codes:  [name] 09/03/2014, 1:23 PM  [employer] Vi Holloway PT [phone]

## 2014-09-03 NOTE — Progress Notes (Signed)
OT Cancellation Note  Patient Details Name: [name] Sr. MRN: [id number] DOB: 1955/12/19   Cancelled Treatment:    Reason Eval/Treat Not Completed: OT screened, no needs identified, will sign off  Angelene Giovanni [place], OTR/L [phone]  09/03/2014, 11:58 AM

## 2014-09-03 NOTE — Progress Notes (Signed)
1735 dangling legs to bedside,having dinner.

## 2014-09-04 ENCOUNTER — Inpatient Hospital Stay (HOSPITAL_COMMUNITY): Payer: BC Managed Care – PPO

## 2014-09-04 DIAGNOSIS — I5041 Acute combined systolic (congestive) and diastolic (congestive) heart failure: Secondary | ICD-10-CM

## 2014-09-04 LAB — GLUCOSE, CAPILLARY
GLUCOSE-CAPILLARY: 161 mg/dL — AB (ref 70–99)
GLUCOSE-CAPILLARY: 237 mg/dL — AB (ref 70–99)
Glucose-Capillary: 227 mg/dL — ABNORMAL HIGH (ref 70–99)
Glucose-Capillary: 169 mg/dL — ABNORMAL HIGH (ref 70–99)

## 2014-09-04 LAB — COMPREHENSIVE METABOLIC PANEL
ALK PHOS: 114 U/L (ref 39–117)
ALT: 63 U/L — ABNORMAL HIGH (ref 0–53)
AST: 94 U/L — AB (ref 0–37)
Albumin: 2.5 g/dL — ABNORMAL LOW (ref 3.5–5.2)
Anion gap: 11 (ref 5–15)
BILIRUBIN TOTAL: 0.5 mg/dL (ref 0.3–1.2)
BUN: 43 mg/dL — AB (ref 6–23)
CO2: 24 mEq/L (ref 19–32)
CREATININE: 1.79 mg/dL — AB (ref 0.50–1.35)
Calcium: 8.7 mg/dL (ref 8.4–10.5)
Chloride: 105 mEq/L (ref 96–112)
GFR calc Af Amer: 46 mL/min — ABNORMAL LOW (ref >90)
GFR calc non Af Amer: 40 mL/min — ABNORMAL LOW (ref >90)
Glucose, Bld: 162 mg/dL — ABNORMAL HIGH (ref 70–99)
POTASSIUM: 4.4 meq/L (ref 3.7–5.3)
Sodium: 140 mEq/L (ref 137–147)
TOTAL PROTEIN: 6.4 g/dL (ref 6.0–8.3)

## 2014-09-04 LAB — CBC
HCT: 29.7 % — ABNORMAL LOW (ref 39.0–52.0)
Hemoglobin: 9.3 g/dL — ABNORMAL LOW (ref 13.0–17.0)
MCH: 26.3 pg (ref 26.0–34.0)
MCHC: 31.3 g/dL (ref 30.0–36.0)
MCV: 83.9 fL (ref 78.0–100.0)
PLATELETS: 84 10*3/uL — AB (ref 150–400)
RBC: 3.54 MIL/uL — ABNORMAL LOW (ref 4.22–5.81)
RDW: 15.8 % — AB (ref 11.5–15.5)
WBC: 5 10*3/uL (ref 4.0–10.5)

## 2014-09-04 LAB — PROTIME-INR
INR: 1.7 — ABNORMAL HIGH (ref 0.00–1.49)
Prothrombin Time: 20.1 seconds — ABNORMAL HIGH (ref 11.6–15.2)

## 2014-09-04 LAB — AMMONIA: Ammonia: 54 umol/L (ref 11–60)

## 2014-09-04 MED ORDER — IOHEXOL 300 MG/ML  SOLN
100.0000 mL | Freq: Once | INTRAMUSCULAR | Status: AC | PRN
  Administered 2014-09-04: 100 mL via INTRAVENOUS

## 2014-09-04 NOTE — Progress Notes (Signed)
[facility] Progress Note  Subjective: Patient has no specific complaints today. The CT scan of the abdomen and pelvis was reviewed. Findings are suggestive for early cirrhosis. There is also suggestion of abnormality in the rectum and colonoscopy was recommended if not recently done however in looking back at records from the office Dr. Madilyn Holloway did a colonoscopy in September of this year with removal of multiple colon polyps.  Objective: Vital signs in last 24 hours: Temp:  [97.3 F (36.3 C)-98.1 F (36.7 C)] 98.1 F (36.7 C) (12/02 0504) Pulse Rate:  [62-79] 79 (12/02 1047) Resp:  [16-20] 16 (12/02 0504) BP: (114-132)/(58-72) 132/72 mmHg (12/02 1047) SpO2:  [97 %-100 %] 99 % (12/02 0504) Weight:  [142.702 kg (314 lb 9.6 oz)] 142.702 kg (314 lb 9.6 oz) (12/02 0504) Weight change: -1.633 kg (-3 lb 9.6 oz)   PE:  He is in no distress  Abdomen soft nontender  Lab Results: Results for orders placed or performed during the hospital encounter of 08/31/14 (from the past 24 hour(s))  Glucose, capillary     Status: Abnormal   Collection Time: 09/03/14  3:59 PM  Result Value Ref Range   Glucose-Capillary 145 (H) 70 - 99 mg/dL   Comment 1 Notify RN   Glucose, capillary     Status: Abnormal   Collection Time: 09/03/14  9:05 PM  Result Value Ref Range   Glucose-Capillary 263 (H) 70 - 99 mg/dL   Comment 1 Notify RN    Comment 2 Documented in Chart   Protime-INR     Status: Abnormal   Collection Time: 09/04/14  6:12 AM  Result Value Ref Range   Prothrombin Time 20.1 (H) 11.6 - 15.2 seconds   INR 1.70 (H) 0.00 - 1.49  CBC     Status: Abnormal   Collection Time: 09/04/14  6:12 AM  Result Value Ref Range   WBC 5.0 4.0 - 10.5 K/uL   RBC 3.54 (L) 4.22 - 5.81 MIL/uL   Hemoglobin 9.3 (L) 13.0 - 17.0 g/dL   HCT 16.129.7 (L) 09.639.0 - 04.552.0 %   MCV 83.9 78.0 - 100.0 fL   MCH 26.3 26.0 - 34.0 pg   MCHC 31.3 30.0 - 36.0 g/dL   RDW 40.915.8 (H) 81.111.5 - 91.415.5 %   Platelets 84 (L) 150 - 400 K/uL   Comprehensive metabolic panel     Status: Abnormal   Collection Time: 09/04/14  6:12 AM  Result Value Ref Range   Sodium 140 137 - 147 mEq/L   Potassium 4.4 3.7 - 5.3 mEq/L   Chloride 105 96 - 112 mEq/L   CO2 24 19 - 32 mEq/L   Glucose, Bld 162 (H) 70 - 99 mg/dL   BUN 43 (H) 6 - 23 mg/dL   Creatinine, Ser 7.821.79 (H) 0.50 - 1.35 mg/dL   Calcium 8.7 8.4 - 95.610.5 mg/dL   Total Protein 6.4 6.0 - 8.3 g/dL   Albumin 2.5 (L) 3.5 - 5.2 g/dL   AST 94 (H) 0 - 37 U/L   ALT 63 (H) 0 - 53 U/L   Alkaline Phosphatase 114 39 - 117 U/L   Total Bilirubin 0.5 0.3 - 1.2 mg/dL   GFR calc non Af Amer 40 (L) >90 mL/min   GFR calc Af Amer 46 (L) >90 mL/min   Anion gap 11 5 - 15  Glucose, capillary     Status: Abnormal   Collection Time: 09/04/14  6:23 AM  Result Value Ref Range   Glucose-Capillary  161 (H) 70 - 99 mg/dL   Comment 1 Notify RN    Comment 2 Documented in Chart   Ammonia     Status: None   Collection Time: 09/04/14  8:24 AM  Result Value Ref Range   Ammonia 54 11 - 60 umol/L  Glucose, capillary     Status: Abnormal   Collection Time: 09/04/14 11:09 AM  Result Value Ref Range   Glucose-Capillary 227 (H) 70 - 99 mg/dL   Comment 1 Notify RN     Studies/Results: No results found.    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  Plan: I would recommend that the patient be given a follow-up appointment with Dr. Dorena Cookey his primary gastroenterologist, in regards to the newly discovered cirrhosis of the liver. Also to consider whether or not repeat evaluation of the rectum would be in order at this time. We will sign off.    [name] F 09/04/2014, 11:54 AM

## 2014-09-04 NOTE — Progress Notes (Signed)
[facility]                                                                              Patient Demographics  [name], is a 58 y.o. male, DOB - April 23, 1956, OZD:[id number]  Admit date - 08/31/2014   Admitting Physician Christiane Ha, MD  Outpatient Primary MD for the patient is [name], MD  LOS - 4   Chief Complaint  Patient presents with  . Hypoglycemia  . Shortness of Breath      HPI on 08/31/2014 by Dr. Hardie Shackleton Sr. is a 58 y.o. male with h/o DM, CKD stage III, chronic systolic heart failure with EF 25%, apical thrombus, htn, presents to ED with blood glucose in the 30s this morning. Family noted he was lethargic and confused, called EMS. recived 2 amps D50 en route. He also c/o wheezing, shortness of breath and cough for a week. Has had several hypoglycemic episodes over the past week. In ED, hypothermic. Rectal temp 93.8 degrees F. Bear Hugger placed. CBG 128. CXR shows bibasilar atelectasis vs infiltrate. WBC normal, UA negative. Lactic acid normal. Creatinine 2.5 (baseline creatinine about 1.5). Patient was noted to be wheezing, required hour long nebulizer and Solumedrol. Denies fever, chills, dysuria, orthopnea.  Assessment & Plan   Acute on chronic systolic/diastolic heart failure -Echocardiogram: EF 25%, 1.5 by 1.5 cm clot apex-seen in the past  -BNP 1552, however lower than previous -Edema has improved however patient did have mild ascites on ultrasound -Will continue IV Lasix, continue to monitor daily weights as well as intake and output  Acute on chronic renal failure, stage III  -Upon admission, Cr 2.51  -Renal US: Negative for hydronephrosis or focal renal abnormality, trace amount of perihepatic ascites and splenomegaly -Baseline creatinine 1.6 -Creatinine currently trending downward, currently 1.79 -Will continue to monitor BMP  Hepatic cirrhosis -Gastroenterology consulted and appreciated -CT scan of  the abdomen/pelvis: Morphological changes on liver suggestive of early cirrhosis -Patient does have significant thrombocytopenia as well as elevated AST ALT and low albumin -Ammonia level trending downward, 54 -Continue lactulose  Possible community-acquired pneumonia -Patient presented with hypothermia and wheezing -Chest x-ray showed mild atelectasis versus infiltrate -Continue Levaquin and bronchodilators -Currently afebrile with no leukocytosis  Hypothermia -Resolved, was at admission -Pro calcitonin was not high, patient nontoxic-appearing, no evidence of septic shock -TSH 0.708, normal -May have been related to hypoglycemia  Hypoglycemia -Resolved, possibly related to worsening renal failure -Lantus was initially held, and increased to 50 units twice daily -Will continue to monitor CBGs  Automatic implantable cardio defibrillator  -Stable   Hypothyroidism -Continue Synthroid, TSH 0.708  Diabetes mellitus, type II -Continue Lantus and CBG monitoring  Left ventricle mural thrombus -Seen on echocardiogram a year ago -Continue Coumadin per pharmacy  Normocytic Anemia/thrombocytopenia  -Anemia panel: Iron 61, ferritin 124 -Occult:   Hyperkalemia -Resolved, patient was given some Kayexalate -Will continue to monitor BMP  Enlarged lymph node -Noted on CT, upper abdominal lymph node largest 3.5 x 2.3 x 1.2 cm -Possibly secondary to cirrhosis -Will need follow-up  Pulmonary nodule -Seen on CT scan, right lower lobe -Repeat CT in 3 months  Distal rectal thickening -  Seen on CT scan, possibly proctitis, recommend outpatient colonoscopy Patient had colonoscopy in September 2015 showed multiple polyps -GI consulted  Code Status: Full  Family Communication: None at bedside  Disposition Plan: Admitted, pending further recommendations from gastroenterology, possible discharge within the next 24 hours.  Time Spent in minutes   45 minutes  Procedures  Renal US:  Negative for hydronephrosis or focal renal abnormality, trace amount of perihepatic ascites and splenomegaly  Echocardiogram: EF 25%, 1.5 by 1.5 cm clot apex-seen in the past   Consults   Gastroenterology  DVT Prophylaxis  Coumadin  Lab Results  Component Value Date   PLT 84* 09/04/2014    Medications  Scheduled Meds: . amiodarone  200 mg Oral Once per day on Sun Sat   And  . amiodarone  400 mg Oral Once per day on Mon Tue Wed Thu Fri  . carvedilol  12.5 mg Oral BID WC  . colesevelam  625 mg Oral BID WC  . furosemide  40 mg Intravenous Daily  . insulin aspart  0-20 Units Subcutaneous TID WC  . insulin glargine  50 Units Subcutaneous BID  . lactulose  20 g Oral TID  . levofloxacin  750 mg Oral QHS  . levothyroxine  187.5 mcg Oral QAC breakfast  . mexiletine  150 mg Oral 3 times per day  . rosuvastatin  10 mg Oral QPM  . sodium chloride  3 mL Intravenous Q12H   Continuous Infusions:  PRN Meds:.acetaminophen **OR** acetaminophen, albuterol, alum & mag hydroxide-simeth, diphenhydrAMINE, guaiFENesin-dextromethorphan, ipratropium-albuterol, magnesium hydroxide, ondansetron **OR** ondansetron (ZOFRAN) IV  Antibiotics    Anti-infectives    Start     Dose/Rate Route Frequency Ordered Stop   09/02/14 1800  levofloxacin (LEVAQUIN) IVPB 750 mg  Status:  Discontinued     750 mg100 mL/hr over 90 Minutes Intravenous Every 48 hours 08/31/14 1752 09/01/14 0813   09/01/14 2200  levofloxacin (LEVAQUIN) tablet 750 mg    Comments:  Levaquin 750 mg po daily for CrCl > 50 mL/min   750 mg Oral Daily at bedtime 09/01/14 0813     08/31/14 1600  levofloxacin (LEVAQUIN) IVPB 750 mg     750 mg100 mL/hr over 90 Minutes Intravenous  Once 08/31/14 1552 08/31/14 2052      Subjective:   [name] seen and examined today.   Patient states he's feeling much better. Denies any chest pain, shortness of breath, abdominal pain, dizziness or headache.  Objective:   Filed Vitals:   09/03/14 2109  09/04/14 0158 09/04/14 0504 09/04/14 1047  BP: 114/58 124/72 127/63 132/72  Pulse: 62 66 62 79  Temp: 97.3 F (36.3 C) 97.7 F (36.5 C) 98.1 F (36.7 C)   TempSrc: Oral Oral Oral   Resp: 18 20 16    Height:      Weight:   142.702 kg (314 lb 9.6 oz)   SpO2: 100% 97% 99%     Wt Readings from Last 3 Encounters:  09/04/14 142.702 kg (314 lb 9.6 oz)  07/04/14 140.161 kg (309 lb)  06/14/14 141.704 kg (312 lb 6.4 oz)     Intake/Output Summary (Last 24 hours) at 09/04/14 1101 Last data filed at 09/04/14 0854  Gross per 24 hour  Intake   2273 ml  Output   1410 ml  Net    863 ml    Exam  General: Well developed, well nourished, NAD, appears stated age  HEENT: NCAT, mucous membranes moist.   Cardiovascular: S1 S2 auscultated, no rubs,  murmurs or gallops. Regular rate and rhythm.  Respiratory: Mild exp wheezing noted, no crackles  Abdomen: Soft, obese, nontender, nondistended, + bowel sounds  Extremities: warm dry without cyanosis clubbing. Trace LE edema B/L  Neuro: AAOx3, cranial nerves grossly intact. Strength 5/5 in patient's upper and lower extremities bilaterally, gait normal  Psych: Normal affect and demeanor with intact judgement and insight   Data Review   Micro Results Recent Results (from the past 240 hour(s))  MRSA PCR Screening     Status: None   Collection Time: 08/31/14  5:46 PM  Result Value Ref Range Status   MRSA by PCR NEGATIVE NEGATIVE Final    Comment:        The GeneXpert MRSA Assay (FDA approved for NASAL specimens only), is one component of a comprehensive MRSA colonization surveillance program. It is not intended to diagnose MRSA infection nor to guide or monitor treatment for MRSA infections.   Blood culture (routine x 2)     Status: None (Preliminary result)   Collection Time: 08/31/14  6:55 PM  Result Value Ref Range Status   Specimen Description BLOOD LEFT ARM  Final   Special Requests BOTTLES DRAWN AEROBIC AND ANAEROBIC 8CC   Final   Culture  Setup Time   Final    09/01/2014 01:01 Performed at [facility]    Culture   Final           BLOOD CULTURE RECEIVED NO GROWTH TO DATE CULTURE WILL BE HELD FOR 5 DAYS BEFORE ISSUING A FINAL NEGATIVE REPORT Performed at [facility]    Report Status PENDING  Incomplete  Blood culture (routine x 2)     Status: None (Preliminary result)   Collection Time: 08/31/14  7:05 PM  Result Value Ref Range Status   Specimen Description BLOOD LEFT HAND  Final   Special Requests BOTTLES DRAWN AEROBIC AND ANAEROBIC 10CC  Final   Culture  Setup Time   Final    09/01/2014 01:02 Performed at [facility]    Culture   Final           BLOOD CULTURE RECEIVED NO GROWTH TO DATE CULTURE WILL BE HELD FOR 5 DAYS BEFORE ISSUING A FINAL NEGATIVE REPORT Performed at [facility]    Report Status PENDING  Incomplete    Radiology Reports Dg Chest 2 View  08/31/2014   CLINICAL DATA:  Hyperglycemia, shortness of breath  EXAM: CHEST  2 VIEW  COMPARISON:  06/14/2014  FINDINGS: Cardiomegaly is noted. Central mild vascular congestion without convincing pulmonary edema. For leads cardiac pacemaker is unchanged in position. Mild basilar atelectasis or infiltrate.  IMPRESSION: Central mild vascular congestion without convincing pulmonary edema. Mild basilar atelectasis or infiltrate. For leads cardiac pacemaker unchanged in position.   Electronically Signed   By: Natasha Mead M.D.   On: 08/31/2014 13:43   Ct Abdomen Pelvis W Contrast  09/04/2014   CLINICAL DATA:  58 year old male with elevated liver enzymes presenting with abdominal pain and lethargy.  EXAM: CT ABDOMEN AND PELVIS WITH CONTRAST  TECHNIQUE: Multidetector CT imaging of the abdomen and pelvis was performed using the standard protocol following bolus administration of intravenous contrast.  CONTRAST:  OMNIPAQUE IOHEXOL 300 MG/ML  SOLN  COMPARISON:  No priors.  FINDINGS: Lower chest: There are patchy areas of  somewhat nodular ground-glass attenuation in the right lower lobe, typified by a 2.4 x 1.8 cm area in the medial aspect of the right lower lobe on image  7 of series 3. Atherosclerotic calcifications are noted in the left anterior descending, left circumflex and right coronary arteries. Cardiomegaly with some low-attenuation and rounding of the myocardium in the left ventricular apex, suggesting fibrofatty metaplasia from prior distal LAD territory myocardial infarction. In addition, there is a partially calcified filling defect in the apex of the left ventricle, suspicious for chronic thrombus (images 6 and 7 of series 2). Biventricular pacemaker leads are noted.  Hepatobiliary: The liver has a shrunken appearance and mildly nodular contour, suggestive of early changes of cirrhosis. No cystic or solid hepatic lesion. No intra or extrahepatic biliary ductal dilatation. Small calcified gallstone lying dependently in the gallbladder. No current findings to suggest acute cholecystitis at this time.  Pancreas: Mild fatty atrophy in the pancreas. There is subtle haziness in the peripancreatic fat. No peripancreatic fluid collections are identified at this time. No pancreatic ductal dilatation.  Spleen: Unremarkable. Two large splenules are noted adjacent to the splenic hilum.  Adrenals/Urinary Tract: Bilateral adrenal glands and bilateral kidneys are normal in appearance. No hydroureteronephrosis. Urinary bladder is normal in appearance.  Stomach/Bowel: Normal appearance of the stomach. No pathologic dilatation of small bowel or colon. Numerous colonic diverticulae are noted. Accurate assessment for subtle early changes of acute diverticulitis is limited on today's examination secondary to the presence of ascites, however, no overt signs of inflammation are appreciated. Some circumferential thickening of the distal rectum is noted, best appreciated on image 94 of series 2. No surrounding mesorectal lymphadenopathy. Normal  appendix.  Vascular/Lymphatic: Extensive atherosclerosis throughout the abdominal and pelvic vasculature, without evidence of aneurysm. Multiple borderline enlarged and mildly enlarged upper abdominal lymph nodes, largest of which is in the hepatoduodenal ligament measuring 3.5 x 2.3 x 1.2 cm (image 27 of series 2).  Reproductive: Prostate gland and seminal vesicles are unremarkable in appearance.  Other: Small volume of ascites, most evident in the perihepatic region and in the anatomic pelvis. No pneumoperitoneum.  Musculoskeletal: There are no aggressive appearing lytic or blastic lesions noted in the visualized portions of the skeleton.  IMPRESSION: 1. Morphologic changes in the liver suggestive of early cirrhosis. No hypervascular lesion identified on today's study to suggest presence of a hepatocellular carcinoma at this time. There is a small volume of ascites. 2. Haziness in the peripancreatic fat. This is nonspecific, but clinical correlation for signs and symptoms of pancreatitis and assessment of lipase level is recommended. 3. Multiple borderline enlarged and mildly enlarged upper abdominal lymph nodes, largest of which measures 3.5 x 2.3 x 1.2 cm in the hepatoduodenal ligament. Enlarged lymph nodes are common in the setting of cirrhosis, and these may be benign, but attention to this region on any future followup examinations is recommended to ensure stability. 4. Extensive atherosclerosis, including at least 3 vessel coronary artery disease. Importantly, there is evidence of prior distal LAD territory myocardial infarction, with some mild rounding of the apex of the left ventricle (implying the regional wall motion abnormality), and there is a partially calcified filling defect in the apex of the left ventricle which is likely to represent chronic thrombus. Correlation with echocardiography is strongly recommended (if one has not been already recently performed), as this places the patient at risk for  systemic embolization. 5. Circumferential thickening of the distal rectum. This could simply represent a proctitis, however, the possibility of distal rectal neoplasm is not excluded, and correlation with colonoscopy is suggested in the near future if one has not been recently performed. 6. Multifocal slightly nodular appearing areas  of ground-glass attenuation in the right lower lobe. These are nonspecific, and favored to be of infectious or inflammatory etiology. Initial follow-up by chest CT without contrast is recommended in 3 months to confirm persistence. This recommendation follows the consensus statement: Recommendations for the Management of Subsolid Pulmonary Nodules Detected at CT: A Statement from the Fleischner Society as published in Radiology 2013; 266:304-317. 7. Colonic diverticulosis without definitive findings to suggest acute diverticulitis at this time. 8. Normal appendix. 9. Cholelithiasis. 10. Additional incidental findings, as above.   Electronically Signed   By: Trudie Reed M.D.   On: 09/04/2014 09:01   US Renal  09/01/2014   CLINICAL DATA:  Acute renal failure, diabetes, hypertension  EXAM: RENAL/URINARY TRACT ULTRASOUND COMPLETE  COMPARISON:  06/11/2013  FINDINGS: Right Kidney:  Length: 11.2 cm. Echogenicity within normal limits. No mass or hydronephrosis visualized.  Left Kidney:  Length: 10.8 cm. Echogenicity within normal limits. No mass or hydronephrosis visualized.  Bladder:  Appears normal for degree of bladder distention.  Trace perihepatic ascites. Incidental note made of splenomegaly, 14.9 x 6.2 x 17 cm ( 821 cc) with small accessory splenules.  IMPRESSION: 1. Negative for hydronephrosis or other focal renal abnormality. 2. Trace amount of perihepatic ascites and splenomegaly incidentally noted.   Electronically Signed   By: Oley Balm M.D.   On: 09/01/2014 15:00    CBC  Recent Labs Lab 08/31/14 1350 09/01/14 0210 09/02/14 0419 09/03/14 0255 09/04/14 0612    WBC 5.9 5.3 9.4 6.6 5.0  HGB 10.7* 9.8* 9.4* 9.2* 9.3*  HCT 33.4* 30.9* 29.6* 29.3* 29.7*  PLT 74* 67* 81* 77* 84*  MCV 83.9 82.8 82.9 83.5 83.9  MCH 26.9 26.3 26.3 26.2 26.3  MCHC 32.0 31.7 31.8 31.4 31.3  RDW 15.8* 15.6* 15.9* 15.9* 15.8*  LYMPHSABS 0.6* 0.5*  --   --   --   MONOABS 0.3 0.1  --   --   --   EOSABS 0.0 0.0  --   --   --   BASOSABS 0.0 0.0  --   --   --     Chemistries   Recent Labs Lab 08/31/14 1350 09/01/14 0210 09/02/14 0419 09/03/14 0255 09/04/14 0612  NA 140 137 135* 137 140  K 5.0 5.6* 5.5* 4.6 4.4  CL 104 101 100 100 105  CO2 24 18* 19 20 24   GLUCOSE 128* 287* 257* 261* 162*  BUN 45* 50* 63* 61* 43*  CREATININE 2.51* 2.24* 2.38* 2.13* 1.79*  CALCIUM 8.8 8.5 8.6 8.6 8.7  MG  --   --   --  1.7  --   AST 87* 62*  --  89* 94*  ALT 49 47  --  55* 63*  ALKPHOS 124* 113  --  122* 114  BILITOT 0.6 0.5  --  0.5 0.5   ------------------------------------------------------------------------------------------------------------------ estimated creatinine clearance is 66.8 mL/min (by C-G formula based on Cr of 1.79). ------------------------------------------------------------------------------------------------------------------ No results for input(s): HGBA1C in the last 72 hours. ------------------------------------------------------------------------------------------------------------------ No results for input(s): CHOL, HDL, LDLCALC, TRIG, CHOLHDL, LDLDIRECT in the last 72 hours. ------------------------------------------------------------------------------------------------------------------ No results for input(s): TSH, T4TOTAL, T3FREE, THYROIDAB in the last 72 hours.  Invalid input(s): FREET3 ------------------------------------------------------------------------------------------------------------------ No results for input(s): VITAMINB12, FOLATE, FERRITIN, TIBC, IRON, RETICCTPCT in the last 72 hours.  Coagulation profile  Recent Labs Lab  08/31/14 1855 09/01/14 0210 09/02/14 0419 09/03/14 0255 09/04/14 0612  INR 3.01* 3.14* 2.60* 2.11* 1.70*    No results for input(s): DDIMER in the last  72 hours.  Cardiac Enzymes No results for input(s): CKMB, TROPONINI, MYOGLOBIN in the last 168 hours.  Invalid input(s): CK ------------------------------------------------------------------------------------------------------------------ Invalid input(s): POCBNP    Sherma Vanmetre D.O. on 09/04/2014 at 11:01 AM  Between 7am to 7pm - Pager - [phone]  After 7pm go to [email or web address] - password [email or web address]  And look for the night coverage person covering for me after hours  [facility] Office  [phone]

## 2014-09-04 NOTE — Progress Notes (Signed)
Inpatient Diabetes Program Recommendations  AACE/ADA: New Consensus Statement on Inpatient Glycemic Control (2013)  Target Ranges:  Prepandial:   less than 140 mg/dL      Peak postprandial:   less than 180 mg/dL (1-2 hours)      Critically ill patients:  140 - 180 mg/dL   Results for MONTERO, MONCE SR. (MRN [id number]) as of 09/04/2014 10:19  Ref. Range 09/03/2014 06:17 09/03/2014 11:02 09/03/2014 15:59 09/03/2014 21:05 09/04/2014 06:23  Glucose-Capillary Latest Range: 70-99 mg/dL 283 (H) 151 (H) 761 (H) 263 (H) 161 (H)   Diabetes history: DM2 Outpatient Diabetes medications: Lantus 50 units BID, Humalog 20 units with supper, Januvia 50 mg daily Current orders for Inpatient glycemic control: Lantus 50 units BID, Novolog 0-20 units AC, Novolog 0-5 units HS  Inpatient Diabetes Program Recommendations Correction (SSI): Please consider ordering Novolog bedtime correction scale. Insulin - Meal Coverage: Please consider ordering Novolog 4 units TID with meals for meal coverage.  Thanks, Orlando Penner, RN, MSN, CCRN, CDE Diabetes Coordinator Inpatient Diabetes Program [phone] (Team Pager) [phone] ([facility] office) [phone] [facility] office)

## 2014-09-04 NOTE — Plan of Care (Signed)
Problem: Phase II Progression Outcomes Goal: Other Phase II Outcomes/Goals Outcome: Progressing Continue monitoring fluid volume status.  No acute events overnight.  Currently receiving oral contrast for CT.  Will continue to monitor patient condition.

## 2014-09-04 NOTE — Progress Notes (Signed)
Per Dr. Catha Gosselin, placed order to D/C telemetry at 0738.  Read and affirmed.

## 2014-09-05 DIAGNOSIS — E785 Hyperlipidemia, unspecified: Secondary | ICD-10-CM

## 2014-09-05 DIAGNOSIS — E669 Obesity, unspecified: Secondary | ICD-10-CM

## 2014-09-05 LAB — GLUCOSE, CAPILLARY
GLUCOSE-CAPILLARY: 142 mg/dL — AB (ref 70–99)
Glucose-Capillary: 137 mg/dL — ABNORMAL HIGH (ref 70–99)
Glucose-Capillary: 148 mg/dL — ABNORMAL HIGH (ref 70–99)
Glucose-Capillary: 153 mg/dL — ABNORMAL HIGH (ref 70–99)

## 2014-09-05 LAB — COMPREHENSIVE METABOLIC PANEL
ALT: 80 U/L — AB (ref 0–53)
AST: 125 U/L — AB (ref 0–37)
Albumin: 2.4 g/dL — ABNORMAL LOW (ref 3.5–5.2)
Alkaline Phosphatase: 133 U/L — ABNORMAL HIGH (ref 39–117)
Anion gap: 12 (ref 5–15)
BUN: 34 mg/dL — ABNORMAL HIGH (ref 6–23)
CO2: 23 mEq/L (ref 19–32)
Calcium: 8.5 mg/dL (ref 8.4–10.5)
Chloride: 103 mEq/L (ref 96–112)
Creatinine, Ser: 1.7 mg/dL — ABNORMAL HIGH (ref 0.50–1.35)
GFR calc Af Amer: 49 mL/min — ABNORMAL LOW (ref >90)
GFR calc non Af Amer: 43 mL/min — ABNORMAL LOW (ref >90)
GLUCOSE: 151 mg/dL — AB (ref 70–99)
Potassium: 4.1 mEq/L (ref 3.7–5.3)
SODIUM: 138 meq/L (ref 137–147)
TOTAL PROTEIN: 6 g/dL (ref 6.0–8.3)
Total Bilirubin: 0.4 mg/dL (ref 0.3–1.2)

## 2014-09-05 LAB — CBC
HCT: 28.8 % — ABNORMAL LOW (ref 39.0–52.0)
Hemoglobin: 8.9 g/dL — ABNORMAL LOW (ref 13.0–17.0)
MCH: 25.9 pg — ABNORMAL LOW (ref 26.0–34.0)
MCHC: 30.9 g/dL (ref 30.0–36.0)
MCV: 83.7 fL (ref 78.0–100.0)
Platelets: 80 10*3/uL — ABNORMAL LOW (ref 150–400)
RBC: 3.44 MIL/uL — ABNORMAL LOW (ref 4.22–5.81)
RDW: 15.7 % — AB (ref 11.5–15.5)
WBC: 4.4 10*3/uL (ref 4.0–10.5)

## 2014-09-05 LAB — PROTIME-INR
INR: 1.49 (ref 0.00–1.49)
Prothrombin Time: 18.2 seconds — ABNORMAL HIGH (ref 11.6–15.2)

## 2014-09-05 LAB — AMMONIA: AMMONIA: 85 umol/L — AB (ref 11–60)

## 2014-09-05 MED ORDER — WARFARIN SODIUM 5 MG PO TABS
5.0000 mg | ORAL_TABLET | Freq: Once | ORAL | Status: AC
  Administered 2014-09-05: 5 mg via ORAL
  Filled 2014-09-05: qty 1

## 2014-09-05 MED ORDER — WARFARIN - PHARMACIST DOSING INPATIENT: Freq: Every day | Status: DC

## 2014-09-05 NOTE — Progress Notes (Signed)
UR completed Zaden Sako K. Munira Polson, RN, BSN, MSHL, CCM  09/05/2014 5:42 PM

## 2014-09-05 NOTE — Care Management Note (Signed)
    Page 1 of 1   09/05/2014     5:46:28 PM CARE MANAGEMENT NOTE 09/05/2014  Patient:  [name], [name]   Account Number:  [id number]  Date Initiated:  09/05/2014  Documentation initiated by:  Donato Schultz  Subjective/Objective Assessment:   CHF     Action/Plan:   CM to follow for disposition needs   Anticipated DC Date:  09/06/2014   Anticipated DC Plan:  HOME/SELF CARE         Choice offered to / List presented to:             Status of service:  Completed, signed off Medicare Important Message given?  NO (If response is "NO", the following Medicare IM given date fields will be blank) Date Medicare IM given:   Medicare IM given by:   Date Additional Medicare IM given:   Additional Medicare IM given by:    Discharge Disposition:  HOME/SELF CARE  Per UR Regulation:  Reviewed for med. necessity/level of care/duration of stay  If discussed at Long Length of Stay Meetings, dates discussed:   09/05/2014    Comments:  Donato Schultz RN, BSN, MSHL, CCM  Nurse - Case Manager,  (Unit [facility])  [phone]  09/05/2014 Social:  From home with wife PT RECS:  None Dispo Plan:  Home / Self care.

## 2014-09-05 NOTE — Progress Notes (Signed)
[facility]                                                                              Patient Demographics  [name], is a 58 y.o. male, DOB - 02/14/1956, ZOX:[id number]  Admit date - 08/31/2014   Admitting Physician Christiane Ha, MD  Outpatient Primary MD for the patient is [name], MD  LOS - 5   Chief Complaint  Patient presents with  . Hypoglycemia  . Shortness of Breath      HPI on 08/31/2014 by Dr. Hardie Shackleton Sr. is a 58 y.o. male with h/o DM, CKD stage III, chronic systolic heart failure with EF 25%, apical thrombus, htn, presents to ED with blood glucose in the 30s this morning. Family noted he was lethargic and confused, called EMS. recived 2 amps D50 en route. He also c/o wheezing, shortness of breath and cough for a week. Has had several hypoglycemic episodes over the past week. In ED, hypothermic. Rectal temp 93.8 degrees F. Bear Hugger placed. CBG 128. CXR shows bibasilar atelectasis vs infiltrate. WBC normal, UA negative. Lactic acid normal. Creatinine 2.5 (baseline creatinine about 1.5). Patient was noted to be wheezing, required hour long nebulizer and Solumedrol. Denies fever, chills, dysuria, orthopnea.  Assessment & Plan   Acute on chronic systolic/diastolic heart failure -Echocardiogram: EF 25%, 1.5 by 1.5 cm clot apex-seen in the past  -BNP 1552, however lower than previous -Edema has improved however patient did have mild ascites on ultrasound -Will continue IV Lasix, continue to monitor daily weights as well as intake and output -Patient has good UO in the past 24hrs 2735  Acute on chronic renal failure, stage III  -Upon admission, Cr 2.51  -Renal US: Negative for hydronephrosis or focal renal abnormality, trace amount of perihepatic ascites and splenomegaly -Baseline creatinine 1.6 -Creatinine currently trending downward, currently 1.70 -Will continue to monitor BMP  Hepatic  cirrhosis -Gastroenterology consulted and appreciated -CT scan of the abdomen/pelvis: Morphological changes on liver suggestive of early cirrhosis -Patient does have significant thrombocytopenia as well as elevated AST ALT and low albumin -Ammonia level trending downward, 54 -Continue lactulose  Possible community-acquired pneumonia -Patient presented with hypothermia and wheezing -Chest x-ray showed mild atelectasis versus infiltrate -Continue Levaquin and bronchodilators -Currently afebrile with no leukocytosis  Hypothermia -Resolved, was at admission -Pro calcitonin was not high, patient nontoxic-appearing, no evidence of septic shock -TSH 0.708, normal -May have been related to hypoglycemia  Hypoglycemia -Resolved, possibly related to worsening renal failure -Lantus was initially held, and increased to 50 units twice daily -Will continue to monitor CBGs  Automatic implantable cardio defibrillator  -Stable   Hypothyroidism -Continue Synthroid, TSH 0.708  Diabetes mellitus, type II -Continue Lantus and CBG monitoring  Left ventricle mural thrombus -Seen on echocardiogram a year ago -Continue Coumadin per pharmacy  Normocytic Anemia/thrombocytopenia  -Anemia panel: Iron 61, ferritin 124 -Occult pending  Hyperkalemia -Resolved, patient was given some Kayexalate -Will continue to monitor BMP  Enlarged lymph node -Noted on CT, upper abdominal lymph node largest 3.5 x 2.3 x 1.2 cm -Possibly secondary to cirrhosis -Will need follow-up  Pulmonary nodule -Seen on CT scan, right lower lobe -  Repeat CT in 3 months  Distal rectal thickening -Seen on CT scan, possibly proctitis, recommend outpatient colonoscopy Patient had colonoscopy in September 2015 showed multiple polyps -GI consulted and recommended followup with Dr. Dorena Cookey  Code Status: Full  Family Communication: None at bedside  Disposition Plan: Admitted, pending further recommendations from  gastroenterology, possible discharge within the next 24 hours.  Time Spent in minutes   45 minutes  Procedures  Renal US: Negative for hydronephrosis or focal renal abnormality, trace amount of perihepatic ascites and splenomegaly  Echocardiogram: EF 25%, 1.5 by 1.5 cm clot apex-seen in the past   Consults   Gastroenterology  DVT Prophylaxis  Coumadin  Lab Results  Component Value Date   PLT 80* 09/05/2014    Medications  Scheduled Meds: . amiodarone  200 mg Oral Once per day on Sun Sat   And  . amiodarone  400 mg Oral Once per day on Mon Tue Wed Thu Fri  . carvedilol  12.5 mg Oral BID WC  . colesevelam  625 mg Oral BID WC  . furosemide  40 mg Intravenous Daily  . insulin aspart  0-20 Units Subcutaneous TID WC  . insulin glargine  50 Units Subcutaneous BID  . lactulose  20 g Oral TID  . levofloxacin  750 mg Oral QHS  . levothyroxine  187.5 mcg Oral QAC breakfast  . mexiletine  150 mg Oral 3 times per day  . rosuvastatin  10 mg Oral QPM  . sodium chloride  3 mL Intravenous Q12H  . warfarin  5 mg Oral ONCE-1800  . Warfarin - Pharmacist Dosing Inpatient   Does not apply q1800   Continuous Infusions:  PRN Meds:.acetaminophen **OR** acetaminophen, albuterol, alum & mag hydroxide-simeth, diphenhydrAMINE, guaiFENesin-dextromethorphan, ipratropium-albuterol, magnesium hydroxide, ondansetron **OR** ondansetron (ZOFRAN) IV  Antibiotics    Anti-infectives    Start     Dose/Rate Route Frequency Ordered Stop   09/02/14 1800  levofloxacin (LEVAQUIN) IVPB 750 mg  Status:  Discontinued     750 mg100 mL/hr over 90 Minutes Intravenous Every 48 hours 08/31/14 1752 09/01/14 0813   09/01/14 2200  levofloxacin (LEVAQUIN) tablet 750 mg    Comments:  Levaquin 750 mg po daily for CrCl > 50 mL/min   750 mg Oral Daily at bedtime 09/01/14 0813     08/31/14 1600  levofloxacin (LEVAQUIN) IVPB 750 mg     750 mg100 mL/hr over 90 Minutes Intravenous  Once 08/31/14 1552 08/31/14 2052       Subjective:   [name] seen and examined today.   Patient states he's feeling much better and would like to go home.  Denies any chest pain, shortness of breath, abdominal pain, dizziness or headache.  Objective:   Filed Vitals:   09/04/14 1047 09/04/14 1601 09/04/14 2129 09/05/14 0551  BP: 132/72 123/64 117/60   Pulse: 79 64 59   Temp:  98.4 F (36.9 C) 97.1 F (36.2 C) 97 F (36.1 C)  TempSrc:  Oral Oral Oral  Resp:  18 18 18   Height:      Weight:    143.654 kg (316 lb 11.2 oz)  SpO2:  100% 99%     Wt Readings from Last 3 Encounters:  09/05/14 143.654 kg (316 lb 11.2 oz)  07/04/14 140.161 kg (309 lb)  06/14/14 141.704 kg (312 lb 6.4 oz)     Intake/Output Summary (Last 24 hours) at 09/05/14 0941 Last data filed at 09/05/14 0939  Gross per 24 hour  Intake  1563 ml  Output   2650 ml  Net  -1087 ml    Exam  General: Well developed, well nourished, NAD, appears stated age  HEENT: NCAT, mucous membranes moist.   Cardiovascular: S1 S2 auscultated, no rubs, murmurs or gallops. Regular rate and rhythm.  Respiratory: clear to ascultation  Abdomen: Soft, obese, nontender, nondistended, + bowel sounds  Extremities: warm dry without cyanosis clubbing, edema  Neuro: AAOx3, no focal deficits  Psych: Normal affect and demeanor with intact judgement and insight   Data Review   Micro Results Recent Results (from the past 240 hour(s))  MRSA PCR Screening     Status: None   Collection Time: 08/31/14  5:46 PM  Result Value Ref Range Status   MRSA by PCR NEGATIVE NEGATIVE Final    Comment:        The GeneXpert MRSA Assay (FDA approved for NASAL specimens only), is one component of a comprehensive MRSA colonization surveillance program. It is not intended to diagnose MRSA infection nor to guide or monitor treatment for MRSA infections.   Blood culture (routine x 2)     Status: None (Preliminary result)   Collection Time: 08/31/14  6:55 PM  Result  Value Ref Range Status   Specimen Description BLOOD LEFT ARM  Final   Special Requests BOTTLES DRAWN AEROBIC AND ANAEROBIC 8CC  Final   Culture  Setup Time   Final    09/01/2014 01:01 Performed at [facility]    Culture   Final           BLOOD CULTURE RECEIVED NO GROWTH TO DATE CULTURE WILL BE HELD FOR 5 DAYS BEFORE ISSUING A FINAL NEGATIVE REPORT Performed at [facility]    Report Status PENDING  Incomplete  Blood culture (routine x 2)     Status: None (Preliminary result)   Collection Time: 08/31/14  7:05 PM  Result Value Ref Range Status   Specimen Description BLOOD LEFT HAND  Final   Special Requests BOTTLES DRAWN AEROBIC AND ANAEROBIC 10CC  Final   Culture  Setup Time   Final    09/01/2014 01:02 Performed at [facility]    Culture   Final           BLOOD CULTURE RECEIVED NO GROWTH TO DATE CULTURE WILL BE HELD FOR 5 DAYS BEFORE ISSUING A FINAL NEGATIVE REPORT Performed at [facility]    Report Status PENDING  Incomplete    Radiology Reports Dg Chest 2 View  08/31/2014   CLINICAL DATA:  Hyperglycemia, shortness of breath  EXAM: CHEST  2 VIEW  COMPARISON:  06/14/2014  FINDINGS: Cardiomegaly is noted. Central mild vascular congestion without convincing pulmonary edema. For leads cardiac pacemaker is unchanged in position. Mild basilar atelectasis or infiltrate.  IMPRESSION: Central mild vascular congestion without convincing pulmonary edema. Mild basilar atelectasis or infiltrate. For leads cardiac pacemaker unchanged in position.   Electronically Signed   By: Natasha Mead M.D.   On: 08/31/2014 13:43   Ct Abdomen Pelvis W Contrast  09/04/2014   CLINICAL DATA:  58 year old male with elevated liver enzymes presenting with abdominal pain and lethargy.  EXAM: CT ABDOMEN AND PELVIS WITH CONTRAST  TECHNIQUE: Multidetector CT imaging of the abdomen and pelvis was performed using the standard protocol following bolus administration of intravenous contrast.   CONTRAST:  OMNIPAQUE IOHEXOL 300 MG/ML  SOLN  COMPARISON:  No priors.  FINDINGS: Lower chest: There are patchy areas of somewhat nodular ground-glass attenuation in  the right lower lobe, typified by a 2.4 x 1.8 cm area in the medial aspect of the right lower lobe on image 7 of series 3. Atherosclerotic calcifications are noted in the left anterior descending, left circumflex and right coronary arteries. Cardiomegaly with some low-attenuation and rounding of the myocardium in the left ventricular apex, suggesting fibrofatty metaplasia from prior distal LAD territory myocardial infarction. In addition, there is a partially calcified filling defect in the apex of the left ventricle, suspicious for chronic thrombus (images 6 and 7 of series 2). Biventricular pacemaker leads are noted.  Hepatobiliary: The liver has a shrunken appearance and mildly nodular contour, suggestive of early changes of cirrhosis. No cystic or solid hepatic lesion. No intra or extrahepatic biliary ductal dilatation. Small calcified gallstone lying dependently in the gallbladder. No current findings to suggest acute cholecystitis at this time.  Pancreas: Mild fatty atrophy in the pancreas. There is subtle haziness in the peripancreatic fat. No peripancreatic fluid collections are identified at this time. No pancreatic ductal dilatation.  Spleen: Unremarkable. Two large splenules are noted adjacent to the splenic hilum.  Adrenals/Urinary Tract: Bilateral adrenal glands and bilateral kidneys are normal in appearance. No hydroureteronephrosis. Urinary bladder is normal in appearance.  Stomach/Bowel: Normal appearance of the stomach. No pathologic dilatation of small bowel or colon. Numerous colonic diverticulae are noted. Accurate assessment for subtle early changes of acute diverticulitis is limited on today's examination secondary to the presence of ascites, however, no overt signs of inflammation are appreciated. Some circumferential  thickening of the distal rectum is noted, best appreciated on image 94 of series 2. No surrounding mesorectal lymphadenopathy. Normal appendix.  Vascular/Lymphatic: Extensive atherosclerosis throughout the abdominal and pelvic vasculature, without evidence of aneurysm. Multiple borderline enlarged and mildly enlarged upper abdominal lymph nodes, largest of which is in the hepatoduodenal ligament measuring 3.5 x 2.3 x 1.2 cm (image 27 of series 2).  Reproductive: Prostate gland and seminal vesicles are unremarkable in appearance.  Other: Small volume of ascites, most evident in the perihepatic region and in the anatomic pelvis. No pneumoperitoneum.  Musculoskeletal: There are no aggressive appearing lytic or blastic lesions noted in the visualized portions of the skeleton.  IMPRESSION: 1. Morphologic changes in the liver suggestive of early cirrhosis. No hypervascular lesion identified on today's study to suggest presence of a hepatocellular carcinoma at this time. There is a small volume of ascites. 2. Haziness in the peripancreatic fat. This is nonspecific, but clinical correlation for signs and symptoms of pancreatitis and assessment of lipase level is recommended. 3. Multiple borderline enlarged and mildly enlarged upper abdominal lymph nodes, largest of which measures 3.5 x 2.3 x 1.2 cm in the hepatoduodenal ligament. Enlarged lymph nodes are common in the setting of cirrhosis, and these may be benign, but attention to this region on any future followup examinations is recommended to ensure stability. 4. Extensive atherosclerosis, including at least 3 vessel coronary artery disease. Importantly, there is evidence of prior distal LAD territory myocardial infarction, with some mild rounding of the apex of the left ventricle (implying the regional wall motion abnormality), and there is a partially calcified filling defect in the apex of the left ventricle which is likely to represent chronic thrombus. Correlation  with echocardiography is strongly recommended (if one has not been already recently performed), as this places the patient at risk for systemic embolization. 5. Circumferential thickening of the distal rectum. This could simply represent a proctitis, however, the possibility of distal rectal neoplasm is not excluded,  and correlation with colonoscopy is suggested in the near future if one has not been recently performed. 6. Multifocal slightly nodular appearing areas of ground-glass attenuation in the right lower lobe. These are nonspecific, and favored to be of infectious or inflammatory etiology. Initial follow-up by chest CT without contrast is recommended in 3 months to confirm persistence. This recommendation follows the consensus statement: Recommendations for the Management of Subsolid Pulmonary Nodules Detected at CT: A Statement from the Fleischner Society as published in Radiology 2013; 266:304-317. 7. Colonic diverticulosis without definitive findings to suggest acute diverticulitis at this time. 8. Normal appendix. 9. Cholelithiasis. 10. Additional incidental findings, as above.   Electronically Signed   By: Trudie Reed M.D.   On: 09/04/2014 09:01   US Renal  09/01/2014   CLINICAL DATA:  Acute renal failure, diabetes, hypertension  EXAM: RENAL/URINARY TRACT ULTRASOUND COMPLETE  COMPARISON:  06/11/2013  FINDINGS: Right Kidney:  Length: 11.2 cm. Echogenicity within normal limits. No mass or hydronephrosis visualized.  Left Kidney:  Length: 10.8 cm. Echogenicity within normal limits. No mass or hydronephrosis visualized.  Bladder:  Appears normal for degree of bladder distention.  Trace perihepatic ascites. Incidental note made of splenomegaly, 14.9 x 6.2 x 17 cm ( 821 cc) with small accessory splenules.  IMPRESSION: 1. Negative for hydronephrosis or other focal renal abnormality. 2. Trace amount of perihepatic ascites and splenomegaly incidentally noted.   Electronically Signed   By: Oley Balm M.D.   On: 09/01/2014 15:00    CBC  Recent Labs Lab 08/31/14 1350 09/01/14 0210 09/02/14 0419 09/03/14 0255 09/04/14 0612 09/05/14 0415  WBC 5.9 5.3 9.4 6.6 5.0 4.4  HGB 10.7* 9.8* 9.4* 9.2* 9.3* 8.9*  HCT 33.4* 30.9* 29.6* 29.3* 29.7* 28.8*  PLT 74* 67* 81* 77* 84* 80*  MCV 83.9 82.8 82.9 83.5 83.9 83.7  MCH 26.9 26.3 26.3 26.2 26.3 25.9*  MCHC 32.0 31.7 31.8 31.4 31.3 30.9  RDW 15.8* 15.6* 15.9* 15.9* 15.8* 15.7*  LYMPHSABS 0.6* 0.5*  --   --   --   --   MONOABS 0.3 0.1  --   --   --   --   EOSABS 0.0 0.0  --   --   --   --   BASOSABS 0.0 0.0  --   --   --   --     Chemistries   Recent Labs Lab 08/31/14 1350 09/01/14 0210 09/02/14 0419 09/03/14 0255 09/04/14 0612 09/05/14 0415  NA 140 137 135* 137 140 138  K 5.0 5.6* 5.5* 4.6 4.4 4.1  CL 104 101 100 100 105 103  CO2 24 18* 19 20 24 23   GLUCOSE 128* 287* 257* 261* 162* 151*  BUN 45* 50* 63* 61* 43* 34*  CREATININE 2.51* 2.24* 2.38* 2.13* 1.79* 1.70*  CALCIUM 8.8 8.5 8.6 8.6 8.7 8.5  MG  --   --   --  1.7  --   --   AST 87* 62*  --  89* 94* 125*  ALT 49 47  --  55* 63* 80*  ALKPHOS 124* 113  --  122* 114 133*  BILITOT 0.6 0.5  --  0.5 0.5 0.4   ------------------------------------------------------------------------------------------------------------------ estimated creatinine clearance is 70.6 mL/min (by C-G formula based on Cr of 1.7). ------------------------------------------------------------------------------------------------------------------ No results for input(s): HGBA1C in the last 72 hours. ------------------------------------------------------------------------------------------------------------------ No results for input(s): CHOL, HDL, LDLCALC, TRIG, CHOLHDL, LDLDIRECT in the last 72 hours. ------------------------------------------------------------------------------------------------------------------ No results for input(s): TSH, T4TOTAL, T3FREE, THYROIDAB  in the last 72  hours.  Invalid input(s): FREET3 ------------------------------------------------------------------------------------------------------------------ No results for input(s): VITAMINB12, FOLATE, FERRITIN, TIBC, IRON, RETICCTPCT in the last 72 hours.  Coagulation profile  Recent Labs Lab 09/01/14 0210 09/02/14 0419 09/03/14 0255 09/04/14 0612 09/05/14 0415  INR 3.14* 2.60* 2.11* 1.70* 1.49    No results for input(s): DDIMER in the last 72 hours.  Cardiac Enzymes No results for input(s): CKMB, TROPONINI, MYOGLOBIN in the last 168 hours.  Invalid input(s): CK ------------------------------------------------------------------------------------------------------------------ Invalid input(s): POCBNP    Cesilia Shinn D.O. on 09/05/2014 at 9:41 AM  Between 7am to 7pm - Pager - [phone]  After 7pm go to [email or web address] - password [email or web address]  And look for the night coverage person covering for me after hours  [facility] Office  [phone]

## 2014-09-05 NOTE — Progress Notes (Signed)
ANTICOAGULATION CONSULT NOTE - Initial Consult  Pharmacy Consult for Coumadin Indication: h/o LV thrombus  Allergies  Allergen Reactions  . Ace Inhibitors     AVOID ALL  BECAUSE OF HEART DISEASE   . Nitroglycerin Other (See Comments)    Reaction unknown  . Procan Sr [Procainamide Hcl]     PT. HAS HEART DISEASE CALLED HYPERTHROPHIC CARDIOMYOPATHY  . Quinidine     PT. HAS HEART DISEASE CALLED HYPERTHROPHIC CARDIOMYOPATHY      Patient Measurements: Height: 6\' 1"  (185.4 cm) Weight: (!) 316 lb 11.2 oz (143.654 kg) (scale B) IBW/kg (Calculated) : 79.9  Vital Signs: Temp: 97 F (36.1 C) (12/03 0551) Temp Source: Oral (12/03 0551) BP: 117/60 mmHg (12/02 2129) Pulse Rate: 59 (12/02 2129)  Labs:  Recent Labs  09/03/14 0255 09/04/14 0612 09/05/14 0415  HGB 9.2* 9.3* 8.9*  HCT 29.3* 29.7* 28.8*  PLT 77* 84* 80*  LABPROT 23.8* 20.1* 18.2*  INR 2.11* 1.70* 1.49  CREATININE 2.13* 1.79* 1.70*    Estimated Creatinine Clearance: 70.6 mL/min (by C-G formula based on Cr of 1.7).   Medical History: Past Medical History  Diagnosis Date  . DM   . HYPERLIPIDEMIA-MIXED   . Obesity, unspecified   . HYPERTENSION, UNSPECIFIED   . CAD (coronary artery disease)     a. Nonobstructive moderate CAD by cath - last 2012.  [name]. Hypertrophic obstructive cardiomyopathy   . Complete heart block     a. s/p pacemaker later upgraded to CRT-D.  [name]. CKD (chronic kidney disease)     a. Baseline Cr 1.4-1.5.  [name]. Chronic systolic CHF (congestive heart failure)     a. NICM (EF 25% in 2012, 20-25% in 09/2013). b. s/p BiV-ICD (MDT).  . Colon polyps   . Ventricular tachycardia     a. EPS/RFCA of VT in 2012. b. On amio, mexilitene. c. Last BiV-ICD gen change 05/2013 (MDT).  . Hypothyroidism   . Fatty liver disease, nonalcoholic   . BPH (benign prostatic hyperplasia)   . Habitual alcohol use     Medications:  Prescriptions prior to admission  Medication Sig Dispense Refill Last Dose  . amiodarone  (PACERONE) 400 MG tablet Take 0.5-1 tablets (200-400 mg total) by mouth daily. Take 200mg  on Sat and Sun take 400mg  all other days 90 tablet 3 08/30/2014 at Unknown time  . carvedilol (COREG) 25 MG tablet Take 1 tablet (25 mg total) by mouth 2 (two) times daily with a meal. 180 tablet 2 08/30/2014 at 0930  . cholecalciferol (VITAMIN D) 1000 UNITS tablet Take 2,000 Units by mouth daily.    08/30/2014 at Unknown time  . fish oil-omega-3 fatty acids 1000 MG capsule Take 3 capsules by mouth daily.    08/30/2014 at Unknown time  . furosemide (LASIX) 40 MG tablet Take by mouth every morning. TAKE 1 TABLET DAILY   08/30/2014 at Unknown time  . insulin glargine (LANTUS) 100 UNIT/ML injection Inject 50 Units into the skin 2 (two) times daily.  90 mL 3 08/31/2014 at Unknown time  . insulin lispro (HUMALOG KWIKPEN) 100 UNIT/ML KiwkPen Inject 16 units with largest/evening meal.Dispense quikpens (Patient taking differently: Inject 16 units with largest/evening meal.Dispense quikpens) 30 mL 11 08/30/2014 at Unknown time  . levothyroxine (SYNTHROID, LEVOTHROID) 150 MCG tablet Take 150 mcg by mouth daily before breakfast.   08/31/2014 at Unknown time  . levothyroxine (SYNTHROID, LEVOTHROID) 75 MCG tablet TAKE 1/2 TABLET (=37.5MCG) ALONG WITH LEVOTHYROXINE 150MCG TO EQUAL 187.5MCG DAILY. 30 tablet 1 08/31/2014 at Unknown  time  . lisinopril (PRINIVIL,ZESTRIL) 2.5 MG tablet TAKE 1 TABLET TWICE A DAY 180 tablet 1 08/30/2014 at Unknown time  . mexiletine (MEXITIL) 150 MG capsule TAKE 1 CAPSULE EVERY 8 HOURS 270 capsule 3 08/30/2014 at Unknown time  . rosuvastatin (CRESTOR) 20 MG tablet Take 0.5 tablets (10 mg total) by mouth daily. 28 tablet 0 08/30/2014 at Unknown time  . sitaGLIPtin (JANUVIA) 50 MG tablet Take 1 tablet (50 mg total) by mouth daily. 30 tablet 2 08/30/2014 at Unknown time  . warfarin (COUMADIN) 5 MG tablet Take 2.5-5 mg by mouth daily. Take 1/2 tablet on Mondays wednesdays and fridays and 1 tablet all other  days   08/30/2014 at Unknown time  . WELCHOL 625 MG tablet TAKE 1 TABLET THREE TIMES A DAY 270 tablet 3 08/30/2014 at Unknown time    Assessment: 58 yo male with h/o CHF/mural thrombus, to resume Coumadin  Goal of Therapy:  INR 2-3 Monitor platelets by anticoagulation protocol: Yes   Plan:  Coumadin 5 mg today Daily INR  Kaisha Wachob, Gary Fleet 09/05/2014,7:18 AM

## 2014-09-05 NOTE — Plan of Care (Signed)
Problem: Phase II Progression Outcomes Goal: Discharge plan remains appropriate-arrangements made Outcome: Progressing No acute events this shift.  Patient states he "might be going home" today.  Will continue to monitor patient condition.

## 2014-09-06 LAB — PROTIME-INR
INR: 1.41 (ref 0.00–1.49)
PROTHROMBIN TIME: 17.4 s — AB (ref 11.6–15.2)

## 2014-09-06 LAB — GLUCOSE, CAPILLARY
GLUCOSE-CAPILLARY: 72 mg/dL (ref 70–99)
Glucose-Capillary: 192 mg/dL — ABNORMAL HIGH (ref 70–99)

## 2014-09-06 LAB — AMMONIA: Ammonia: 88 umol/L — ABNORMAL HIGH (ref 11–60)

## 2014-09-06 MED ORDER — APIXABAN 5 MG PO TABS: 5.0000 mg | ORAL_TABLET | Freq: Two times a day (BID) | ORAL | Status: DC

## 2014-09-06 MED ORDER — APIXABAN 5 MG PO TABS: 10.0000 mg | ORAL_TABLET | Freq: Two times a day (BID) | ORAL | Status: DC

## 2014-09-06 MED ORDER — LACTULOSE 10 GM/15ML PO SOLN: 20.0000 g | Freq: Three times a day (TID) | ORAL | Status: DC

## 2014-09-06 MED ORDER — GUAIFENESIN-DM 100-10 MG/5ML PO SYRP: 5.0000 mL | ORAL_SOLUTION | ORAL | Status: DC | PRN

## 2014-09-06 MED ORDER — APIXABAN 5 MG PO TABS
10.0000 mg | ORAL_TABLET | Freq: Two times a day (BID) | ORAL | Status: DC
  Administered 2014-09-06: 10 mg via ORAL
  Filled 2014-09-06 (×2): qty 2

## 2014-09-06 NOTE — Progress Notes (Signed)
D/C'd IV; D/C'd pt.  Explained discharge instructions to pt, and he had no further questions at this time.  Explained in detail the importance of taking Eliquis as prescribed and not to miss any doses.  Explained to make prior arrangements with his pharmacy to insure that they will have his refills in stock.  Explained to plan accordingly if traveling.  Pt was in no acute distress.

## 2014-09-06 NOTE — Discharge Instructions (Signed)
Heart Failure °Heart failure is a condition in which the heart has trouble pumping blood. This means your heart does not pump blood efficiently for your body to work well. In some cases of heart failure, fluid may back up into your lungs or you may have swelling (edema) in your lower legs. Heart failure is usually a long-term (chronic) condition. It is important for you to take good care of yourself and follow your health care provider's treatment plan. °CAUSES  °Some health conditions can cause heart failure. Those health conditions include: °· High blood pressure (hypertension). Hypertension causes the heart muscle to work harder than normal. When pressure in the blood vessels is high, the heart needs to pump (contract) with more force in order to circulate blood throughout the body. High blood pressure eventually causes the heart to become stiff and weak. °· Coronary artery disease (CAD). CAD is the buildup of cholesterol and fat (plaque) in the arteries of the heart. The blockage in the arteries deprives the heart muscle of oxygen and blood. This can cause chest pain and may lead to a heart attack. High blood pressure can also contribute to CAD. °· Heart attack (myocardial infarction). A heart attack occurs when one or more arteries in the heart become blocked. The loss of oxygen damages the muscle tissue of the heart. When this happens, part of the heart muscle dies. The injured tissue does not contract as well and weakens the heart's ability to pump blood. °· Abnormal heart valves. When the heart valves do not open and close properly, it can cause heart failure. This makes the heart muscle pump harder to keep the blood flowing. °· Heart muscle disease (cardiomyopathy or myocarditis). Heart muscle disease is damage to the heart muscle from a variety of causes. These can include drug or alcohol abuse, infections, or unknown reasons. These can increase the risk of heart failure. °· Lung disease. Lung disease  makes the heart work harder because the lungs do not work properly. This can cause a strain on the heart, leading it to fail. °· Diabetes. Diabetes increases the risk of heart failure. High blood sugar contributes to high fat (lipid) levels in the blood. Diabetes can also cause slow damage to tiny blood vessels that carry important nutrients to the heart muscle. When the heart does not get enough oxygen and food, it can cause the heart to become weak and stiff. This leads to a heart that does not contract efficiently. °· Other conditions can contribute to heart failure. These include abnormal heart rhythms, thyroid problems, and low blood counts (anemia). °Certain unhealthy behaviors can increase the risk of heart failure, including: °· Being overweight. °· Smoking or chewing tobacco. °· Eating foods high in fat and cholesterol. °· Abusing illicit drugs or alcohol. °· Lacking physical activity. °SYMPTOMS  °Heart failure symptoms may vary and can be hard to detect. Symptoms may include: °· Shortness of breath with activity, such as climbing stairs. °· Persistent cough. °· Swelling of the feet, ankles, legs, or abdomen. °· Unexplained weight gain. °· Difficulty breathing when lying flat (orthopnea). °· Waking from sleep because of the need to sit up and get more air. °· Rapid heartbeat. °· Fatigue and loss of energy. °· Feeling light-headed, dizzy, or close to fainting. °· Loss of appetite. °· Nausea. °· Increased urination during the night (nocturia). °DIAGNOSIS  °A diagnosis of heart failure is based on your history, symptoms, physical examination, and diagnostic tests. Diagnostic tests for heart failure may include: °·   Echocardiography. °· Electrocardiography. °· Chest X-ray. °· Blood tests. °· Exercise stress test. °· Cardiac angiography. °· Radionuclide scans. °TREATMENT  °Treatment is aimed at managing the symptoms of heart failure. Medicines, behavioral changes, or surgical intervention may be necessary to  treat heart failure. °· Medicines to help treat heart failure may include: °¨ Angiotensin-converting enzyme (ACE) inhibitors. This type of medicine blocks the effects of a blood protein called angiotensin-converting enzyme. ACE inhibitors relax (dilate) the blood vessels and help lower blood pressure. °¨ Angiotensin receptor blockers (ARBs). This type of medicine blocks the actions of a blood protein called angiotensin. Angiotensin receptor blockers dilate the blood vessels and help lower blood pressure. °¨ Water pills (diuretics). Diuretics cause the kidneys to remove salt and water from the blood. The extra fluid is removed through urination. This loss of extra fluid lowers the volume of blood the heart pumps. °¨ Beta blockers. These prevent the heart from beating too fast and improve heart muscle strength. °¨ Digitalis. This increases the force of the heartbeat. °· Healthy behavior changes include: °¨ Obtaining and maintaining a healthy weight. °¨ Stopping smoking or chewing tobacco. °¨ Eating heart-healthy foods. °¨ Limiting or avoiding alcohol. °¨ Stopping illicit drug use. °¨ Physical activity as directed by your health care provider. °· Surgical treatment for heart failure may include: °¨ A procedure to open blocked arteries, repair damaged heart valves, or remove damaged heart muscle tissue. °¨ A pacemaker to improve heart muscle function and control certain abnormal heart rhythms. °¨ An internal cardioverter defibrillator to treat certain serious abnormal heart rhythms. °¨ A left ventricular assist device (LVAD) to assist the pumping ability of the heart. °HOME CARE INSTRUCTIONS  °· Take medicines only as directed by your health care provider. Medicines are important in reducing the workload of your heart, slowing the progression of heart failure, and improving your symptoms. °¨ Do not stop taking your medicine unless directed by your health care provider. °¨ Do not skip any dose of medicine. °¨ Refill your  prescriptions before you run out of medicine. Your medicines are needed every day. °· Engage in moderate physical activity if directed by your health care provider. Moderate physical activity can benefit some people. The elderly and people with severe heart failure should consult with a health care provider for physical activity recommendations. °· Eat heart-healthy foods. Food choices should be free of trans fat and low in saturated fat, cholesterol, and salt (sodium). Healthy choices include fresh or frozen fruits and vegetables, fish, lean meats, legumes, fat-free or low-fat dairy products, and whole grain or high fiber foods. Talk to a dietitian to learn more about heart-healthy foods. °· Limit sodium if directed by your health care provider. Sodium restriction may reduce symptoms of heart failure in some people. Talk to a dietitian to learn more about heart-healthy seasonings. °· Use healthy cooking methods. Healthy cooking methods include roasting, grilling, broiling, baking, poaching, steaming, or stir-frying. Talk to a dietitian to learn more about healthy cooking methods. °· Limit fluids if directed by your health care provider. Fluid restriction may reduce symptoms of heart failure in some people. °· Weigh yourself every day. Daily weights are important in the early recognition of excess fluid. You should weigh yourself every morning after you urinate and before you eat breakfast. Wear the same amount of clothing each time you weigh yourself. Record your daily weight. Provide your health care provider with your weight record. °· Monitor and record your blood pressure if directed by your health care   provider.  Check your pulse if directed by your health care provider.  Lose weight if directed by your health care provider. Weight loss may reduce symptoms of heart failure in some people.  Stop smoking or chewing tobacco. Nicotine makes your heart work harder by causing your blood vessels to constrict.  Do not use nicotine gum or patches before talking to your health care provider.  Keep all follow-up visits as directed by your health care provider. This is important.  Limit alcohol intake to no more than 1 drink per day for nonpregnant women and 2 drinks per day for men. One drink equals 12 ounces of beer, 5 ounces of wine, or 1 ounces of hard liquor. Drinking more than that is harmful to your heart. Tell your health care provider if you drink alcohol several times a week. Talk with your health care provider about whether alcohol is safe for you. If your heart has already been damaged by alcohol or you have severe heart failure, drinking alcohol should be stopped completely.  Stop illicit drug use.  Stay up-to-date with immunizations. It is especially important to prevent respiratory infections through current pneumococcal and influenza immunizations.  Manage other health conditions such as hypertension, diabetes, thyroid disease, or abnormal heart rhythms as directed by your health care provider.  Learn to manage stress.  Plan rest periods when fatigued.  Learn strategies to manage high temperatures. If the weather is extremely hot:  Avoid vigorous physical activity.  Use air conditioning or fans or seek a cooler location.  Avoid caffeine and alcohol.  Wear loose-fitting, lightweight, and light-colored clothing.  Learn strategies to manage cold temperatures. If the weather is extremely cold:  Avoid vigorous physical activity.  Layer clothes.  Wear mittens or gloves, a hat, and a scarf when going outside.  Avoid alcohol.  Obtain ongoing education and support as needed.  Participate in or seek rehabilitation as needed to maintain or improve independence and quality of life. SEEK MEDICAL CARE IF:   Your weight increases by 03 lb/1.4 kg in 1 day or 05 lb/2.3 kg in a week.  You have increasing shortness of breath that is unusual for you.  You are unable to participate in  your usual physical activities.  You tire easily.  You cough more than normal, especially with physical activity.  You have any or more swelling in areas such as your hands, feet, ankles, or abdomen.  You are unable to sleep because it is hard to breathe.  You feel like your heart is beating fast (palpitations).  You become dizzy or light-headed upon standing up. SEEK IMMEDIATE MEDICAL CARE IF:   You have difficulty breathing.  There is a change in mental status such as decreased alertness or difficulty with concentration.  You have a pain or discomfort in your chest.  You have an episode of fainting (syncope). MAKE SURE YOU:   Understand these instructions.  Will watch your condition.  Will get help right away if you are not doing well or get worse. Document Released: 09/20/2005 Document Revised: 02/04/2014 Document Reviewed: 10/20/2012 [facility] Patient Information 2015 [place], [place]. This information is not intended to replace advice given to you by your health care provider. Make sure you discuss any questions you have with your health care provider.  Information on my medicine - ELIQUIS (apixaban)  This medication education was reviewed with me or my healthcare representative as part of my discharge preparation.  The pharmacist that spoke with me during my hospital stay  was:  Sampson SiMancheril, Calder Oblinger G, [facility]  Why was Eliquis prescribed for you? Eliquis was prescribed to treat blood clots that may have been found in the veins of your legs (deep vein thrombosis) or in your lungs (pulmonary embolism) and to reduce the risk of them occurring again.  What do You need to know about Eliquis ? The starting dose is 10 mg (two 5 mg tablets) taken TWICE daily for the FIRST SEVEN (7) DAYS, then on 09/13/14 the dose is reduced to ONE 5 mg tablet taken TWICE daily.  Eliquis may be taken with or without food.   Try to take the dose about the same time in the morning and in the  evening. If you have difficulty swallowing the tablet whole please discuss with your pharmacist how to take the medication safely.  Take Eliquis exactly as prescribed and DO NOT stop taking Eliquis without talking to the doctor who prescribed the medication.  Stopping may increase your risk of developing a new blood clot.  Refill your prescription before you run out.  After discharge, you should have regular check-up appointments with your healthcare provider that is prescribing your Eliquis.    What do you do if you miss a dose? If a dose of ELIQUIS is not taken at the scheduled time, take it as soon as possible on the same day and twice-daily administration should be resumed. The dose should not be doubled to make up for a missed dose.  Important Safety Information A possible side effect of Eliquis is bleeding. You should call your healthcare provider right away if you experience any of the following: ? Bleeding from an injury or your nose that does not stop. ? Unusual colored urine (red or dark brown) or unusual colored stools (red or black). ? Unusual bruising for unknown reasons. ? A serious fall or if you hit your head (even if there is no bleeding).  Some medicines may interact with Eliquis and might increase your risk of bleeding or clotting while on Eliquis. To help avoid this, consult your healthcare provider or pharmacist prior to using any new prescription or non-prescription medications, including herbals, vitamins, non-steroidal anti-inflammatory drugs (NSAIDs) and supplements.  This website has more information on Eliquis (apixaban): http://[email or web address].com/eliquis/home

## 2014-09-06 NOTE — Progress Notes (Signed)
ANTICOAGULATION CONSULT NOTE - Initial Consult  Pharmacy Consult for Apixaban Indication: H/o of LV thrombus   Allergies  Allergen Reactions  . Ace Inhibitors     AVOID ALL  BECAUSE OF HEART DISEASE   . Nitroglycerin Other (See Comments)    Reaction unknown  . Procan Sr [Procainamide Hcl]     PT. HAS HEART DISEASE CALLED HYPERTHROPHIC CARDIOMYOPATHY  . Quinidine     PT. HAS HEART DISEASE CALLED HYPERTHROPHIC CARDIOMYOPATHY      Patient Measurements: Height: 6\' 1"  (185.4 cm) Weight: (!) 315 lb 0.6 oz (142.9 kg) IBW/kg (Calculated) : 79.9  Vital Signs: Temp: 97.8 F (36.6 C) (12/04 0624) Temp Source: Oral (12/04 0624) BP: 114/60 mmHg (12/04 0624) Pulse Rate: 65 (12/04 0624)  Labs:  Recent Labs  09/04/14 0612 09/05/14 0415 09/06/14 0400  HGB 9.3* 8.9*  --   HCT 29.7* 28.8*  --   PLT 84* 80*  --   LABPROT 20.1* 18.2* 17.4*  INR 1.70* 1.49 1.41  CREATININE 1.79* 1.70*  --     Estimated Creatinine Clearance: 70.4 mL/min (by C-G formula based on Cr of 1.7).   Medical History: Past Medical History  Diagnosis Date  . DM   . HYPERLIPIDEMIA-MIXED   . Obesity, unspecified   . HYPERTENSION, UNSPECIFIED   . CAD (coronary artery disease)     a. Nonobstructive moderate CAD by cath - last 2012.  [name]. Hypertrophic obstructive cardiomyopathy   . Complete heart block     a. s/p pacemaker later upgraded to CRT-D.  [name]. CKD (chronic kidney disease)     a. Baseline Cr 1.4-1.5.  [name]. Chronic systolic CHF (congestive heart failure)     a. NICM (EF 25% in 2012, 20-25% in 09/2013). b. s/p BiV-ICD (MDT).  . Colon polyps   . Ventricular tachycardia     a. EPS/RFCA of VT in 2012. b. On amio, mexilitene. c. Last BiV-ICD gen change 05/2013 (MDT).  . Hypothyroidism   . Fatty liver disease, nonalcoholic   . BPH (benign prostatic hyperplasia)   . Habitual alcohol use     Medications:  Prescriptions prior to admission  Medication Sig Dispense Refill Last Dose  . amiodarone (PACERONE)  400 MG tablet Take 0.5-1 tablets (200-400 mg total) by mouth daily. Take 200mg  on Sat and Sun take 400mg  all other days 90 tablet 3 08/30/2014 at Unknown time  . carvedilol (COREG) 25 MG tablet Take 1 tablet (25 mg total) by mouth 2 (two) times daily with a meal. 180 tablet 2 08/30/2014 at 0930  . cholecalciferol (VITAMIN D) 1000 UNITS tablet Take 2,000 Units by mouth daily.    08/30/2014 at Unknown time  . fish oil-omega-3 fatty acids 1000 MG capsule Take 3 capsules by mouth daily.    08/30/2014 at Unknown time  . furosemide (LASIX) 40 MG tablet Take by mouth every morning. TAKE 1 TABLET DAILY   08/30/2014 at Unknown time  . insulin glargine (LANTUS) 100 UNIT/ML injection Inject 50 Units into the skin 2 (two) times daily.  90 mL 3 08/31/2014 at Unknown time  . insulin lispro (HUMALOG KWIKPEN) 100 UNIT/ML KiwkPen Inject 16 units with largest/evening meal.Dispense quikpens (Patient taking differently: Inject 16 units with largest/evening meal.Dispense quikpens) 30 mL 11 08/30/2014 at Unknown time  . levothyroxine (SYNTHROID, LEVOTHROID) 150 MCG tablet Take 150 mcg by mouth daily before breakfast.   08/31/2014 at Unknown time  . levothyroxine (SYNTHROID, LEVOTHROID) 75 MCG tablet TAKE 1/2 TABLET (=37.5MCG) ALONG WITH LEVOTHYROXINE 150MCG TO EQUAL  187.5MCG DAILY. 30 tablet 1 08/31/2014 at Unknown time  . lisinopril (PRINIVIL,ZESTRIL) 2.5 MG tablet TAKE 1 TABLET TWICE A DAY 180 tablet 1 08/30/2014 at Unknown time  . mexiletine (MEXITIL) 150 MG capsule TAKE 1 CAPSULE EVERY 8 HOURS 270 capsule 3 08/30/2014 at Unknown time  . rosuvastatin (CRESTOR) 20 MG tablet Take 0.5 tablets (10 mg total) by mouth daily. 28 tablet 0 08/30/2014 at Unknown time  . sitaGLIPtin (JANUVIA) 50 MG tablet Take 1 tablet (50 mg total) by mouth daily. 30 tablet 2 08/30/2014 at Unknown time  . warfarin (COUMADIN) 5 MG tablet Take 2.5-5 mg by mouth daily. Take 1/2 tablet on Mondays wednesdays and fridays and 1 tablet all other days    08/30/2014 at Unknown time  . WELCHOL 625 MG tablet TAKE 1 TABLET THREE TIMES A DAY 270 tablet 3 08/30/2014 at Unknown time    Assessment: 58 YOM with h/o of CHF/mural thrombus was on Coumadin PTA. Now switching to Eliquis for treatment. Thrombus appears to be small in size since a year ago per MD. Age 58, Wt 142 kg, SCr 1.7. Patient qualifies for full dose Eliquis.   Goal of Therapy:  Resolution of thrombus Monitor platelets by anticoagulation protocol: Yes   Plan:  -Start Eliquis 10 mg twice daily x 7 days followed by 5 mg twice daily  -Monitor for s/s of bleeding -Pt was educated on the drug   Vinnie Level, PharmD., BCPS Clinical Pharmacist Pager [phone]

## 2014-09-06 NOTE — Discharge Planning (Signed)
Medicare Important Message given? YES  (If response is "NO", the following Medicare IM given date fields will be blank)  Date Medicare IM given: 09/06/2014 Medicare IM given by: Sidney Ace, RN, BSN   [phone]            Pt adm on 09/04/14 with hypoglycemia, hypothermia, CHF, and acute on chronic renal failure.  PTA, pt independent, resided at home with family.  Pt discharging on Eliquis; copay $50/no auth needed.  Pt has Nurse, learning disability and can utilize $10/month copay card.  Pt given free trial card and copay card for med.    Jerrell Belfast, RN, BSN Phone #[phone]

## 2014-09-06 NOTE — Discharge Summary (Signed)
Physician Discharge Summary  ABDULSALAM BERDINE Holloway. LDJ:[id number] DOB: 07-01-1956 DOA: 08/31/2014  PCP: [name], MD  Admit date: 08/31/2014 Discharge date: 09/06/2014  Time spent: 45 minutes  Recommendations for Outpatient Follow-up:  Patient will be discharged to home.  He is to followup with his primary care physician within one week of discharge. Patient to continue taking his medications as prescribed. Patient should follow a heart healthy carb modified diet with fluid restriction per day. Patient may resume activity as tolerated. Patient will need to follow-up with Dr. Dorena Cookey, gastroenterologist within 1-2 weeks of discharge.  Discharge Diagnoses:  Acute on chronic systolic and diastolic heart failure Acute on chronic renal failure, stage III Hepatic cirrhosis Possible community-acquired pneumonia Hypothermia Hypoglycemia Automated implantable cardiac defibrillator Hypothyroidism and diabetes mellitus, type II Left ventricular thrombus Normocytic anemia/thrombocytopenia Hyperkalemia Enlarged lymph node Pulmonary nodule Distal rectal thickening   Discharge Condition: Stable   Diet recommendation:  Heart healthy/carb modified/1500 mL fluid restriction per day   Filed Weights   09/04/14 0504 09/05/14 0551 09/06/14 0624  Weight: 142.702 kg (314 lb 9.6 oz) 143.654 kg (316 lb 11.2 oz) 142.9 kg (315 lb 0.6 oz)    History of present illness:  on 08/31/2014 by Dr. Hardie Shackleton Holloway. is a 58 y.o. male with h/o DM, CKD stage III, chronic systolic heart failure with EF 25%, apical thrombus, htn, presents to ED with blood glucose in the 30s this morning. Family noted he was lethargic and confused, called EMS. recived 2 amps D50 en route. He also c/o wheezing, shortness of breath and cough for a week. Has had several hypoglycemic episodes over the past week. In ED, hypothermic. Rectal temp 93.8 degrees F. Bear Hugger placed. CBG 128. CXR shows  bibasilar atelectasis vs infiltrate. WBC normal, UA negative. Lactic acid normal. Creatinine 2.5 (baseline creatinine about 1.5). Patient was noted to be wheezing, required hour long nebulizer and Solumedrol. Denies fever, chills, dysuria, orthopnea.  Hospital Course:  Acute on chronic systolic/diastolic heart failure -Echocardiogram: EF 25%, 1.5 by 1.5 cm clot apex-seen in the past  -BNP 1552, however lower than previous -Edema has improved however patient did have mild ascites on ultrasound -Initially placed on IV Lasix with good urine output, continue to monitor daily weights as well as intake and output -Patient has good UO in the past 24hrs 1965 -Patient will need follow-up with his cardiologist upon discharge   Acute on chronic renal failure, stage III  -Upon admission, Cr 2.51  -Renal US: Negative for hydronephrosis or focal renal abnormality, trace amount of perihepatic ascites and splenomegaly -Baseline creatinine 1.6 -Creatinine currently trending downward, currentlyRepeat BMP within 1 week of discharge   Hepatic cirrhosis -Gastroenterology consulted and appreciated -CT scan of the abdomen/pelvis: Morphological changes on liver suggestive of early cirrhosis -Patient does have significant thrombocytopenia as well as elevated AST ALT and low albumin -Continue lactulose -Patient has been refusing his lactulose during his hospitalization, explained to him the need for lactulose. Patient understands -Patient will need to follow-up with Dr. Dorena Cookey, gastroenterology   Possible community-acquired pneumonia -Patient presented with hypothermia and wheezing -Chest x-ray showed mild atelectasis versus infiltrate -Continue Levaquin and bronchodilators -Currently afebrile with no leukocytosis  Hypothermia -Resolved, was at admission -Pro calcitonin was not high, patient nontoxic-appearing, no evidence of septic shock -TSH 0.708, normal -May have been related to  hypoglycemia  Hypoglycemia -Resolved, possibly related to worsening renal failure -Lantus was initially held, but restarted and increased to 50 units  twice daily with CBG monitoring during hospitalization -Continue home regimen at discharge  Automatic implantable cardio defibrillator  -Stable   Hypothyroidism -Continue Synthroid, TSH 0.708  Diabetes mellitus, type II -As above -Continue home regimen at discharge   Left ventricle mural thrombus -Seen on echocardiogram a year ago -Appears to be smaller in size as compared to echocardiogram from a year ago  -Spoke with Dr. Sharrell Ku, cardiologist, okay to switch patient to [facility]  -Pharmacy consulted for Eliquis, will be discharged with 10 mg twice a day for 7 days then 5 mg twice a day thereafter. Patient will need to follow-up with cardiology for further dose reduction.   Normocytic Anemia/thrombocytopenia  -Anemia panel: Iron 61, ferritin 124  Hyperkalemia -Resolved, patient was given some Kayexalate  Enlarged lymph node -Noted on CT, upper abdominal lymph node largest 3.5 x 2.3 x 1.2 cm -Possibly secondary to cirrhosis -Will need follow-up CT in 3 months   Pulmonary nodule -Seen on CT scan, right lower lobe -Repeat CT in 3 months  Distal rectal thickening -Seen on CT scan, possibly proctitis, recommend outpatient colonoscopy -Patient had colonoscopy in September 2015 showed multiple polyps -GI consulted and recommended followup with Dr. Dorena Cookey  Procedures: Renal US: Negative for hydronephrosis or focal renal abnormality, trace amount of perihepatic ascites and splenomegaly  Echocardiogram: EF 25%, 1.5 by 1.5 cm clot apex-seen in the past   Consultations: Gastroenterology  Cardiology, Dr. Sharrell Ku, via phone  Discharge Exam: Filed Vitals:   09/06/14 0624  BP: 114/60  Pulse: 65  Temp: 97.8 F (36.6 C)  Resp: 21     General: Well developed, well nourished, NAD, appears stated age  HEENT: NCAT,  mucous membranes moist.  Cardiovascular: S1 S2 auscultated, no rubs, murmurs or gallops. Regular rate and rhythm.  Respiratory: Clear to auscultation bilaterally with equal chest rise  Abdomen: Soft, obese, nontender, nondistended, + bowel sounds  Extremities: warm dry without cyanosis clubbing or edema  Neuro: AAOx3, no focal deficits, gait seen and normal  Psych: appropriate mood and affect   Discharge Instructions      Discharge Instructions    (HEART FAILURE PATIENTS) Call MD:  Anytime you have any of the following symptoms: 1) 3 pound weight gain in 24 hours or 5 pounds in 1 week 2) shortness of breath, with or without a dry hacking cough 3) swelling in the hands, feet or stomach 4) if you have to sleep on extra pillows at night in order to breathe.    Complete by:  As directed      ACE Inhibitor / ARB already ordered    Complete by:  As directed      Diet - low sodium heart healthy    Complete by:  As directed      Diet Carb Modified    Complete by:  As directed      Discharge instructions    Complete by:  As directed   Patient will be discharged to home.  He is to followup with his primary care physician within one week of discharge. Patient to continue taking his medications as prescribed. Patient should follow a heart healthy carb modified diet with fluid restriction per day. Patient may resume activity as tolerated. Patient will need to follow-up with Dr. Dorena Cookey, gastroenterologist within 1-2 weeks of discharge.     Increase activity slowly    Complete by:  As directed             Medication List  STOP taking these medications        warfarin 5 MG tablet  Commonly known as:  COUMADIN      TAKE these medications        amiodarone 400 MG tablet  Commonly known as:  PACERONE  Take 0.5-1 tablets (200-400 mg total) by mouth daily. Take 200mg  on Sat and Sun take 400mg  all other days     apixaban 5 MG Tabs tablet  Commonly known as:  ELIQUIS  Take 2  tablets (10 mg total) by mouth 2 (two) times daily.     apixaban 5 MG Tabs tablet  Commonly known as:  ELIQUIS  Take 1 tablet (5 mg total) by mouth 2 (two) times daily.  Start taking on:  09/13/2014     carvedilol 25 MG tablet  Commonly known as:  COREG  Take 1 tablet (25 mg total) by mouth 2 (two) times daily with a meal.     cholecalciferol 1000 UNITS tablet  Commonly known as:  VITAMIN D  Take 2,000 Units by mouth daily.     fish oil-omega-3 fatty acids 1000 MG capsule  Take 3 capsules by mouth daily.     furosemide 40 MG tablet  Commonly known as:  LASIX  Take by mouth every morning. TAKE 1 TABLET DAILY     guaiFENesin-dextromethorphan 100-10 MG/5ML syrup  Commonly known as:  ROBITUSSIN DM  Take 5 mLs by mouth every 4 (four) hours as needed for cough.     insulin glargine 100 UNIT/ML injection  Commonly known as:  LANTUS  Inject 50 Units into the skin 2 (two) times daily.     insulin lispro 100 UNIT/ML KiwkPen  Commonly known as:  HUMALOG KWIKPEN  Inject 16 units with largest/evening meal.Dispense quikpens     lactulose 10 GM/15ML solution  Commonly known as:  CHRONULAC  Take 30 mLs (20 g total) by mouth 3 (three) times daily. To maintain 2-3 loose bowel movements per day.     levothyroxine 150 MCG tablet  Commonly known as:  SYNTHROID, LEVOTHROID  Take 150 mcg by mouth daily before breakfast.     levothyroxine 75 MCG tablet  Commonly known as:  SYNTHROID, LEVOTHROID  TAKE 1/2 TABLET (=37.5MCG) ALONG WITH LEVOTHYROXINE TO EQUAL 187. DAILY.     lisinopril 2.5 MG tablet  Commonly known as:  PRINIVIL,ZESTRIL  TAKE 1 TABLET TWICE A DAY     mexiletine 150 MG capsule  Commonly known as:  MEXITIL  TAKE 1 CAPSULE EVERY 8 HOURS     rosuvastatin 20 MG tablet  Commonly known as:  CRESTOR  Take 0.5 tablets (10 mg total) by mouth daily.     sitaGLIPtin 50 MG tablet  Commonly known as:  JANUVIA  Take 1 tablet (50 mg total) by mouth daily.     WELCHOL 625  MG tablet  Generic drug:  colesevelam  TAKE 1 TABLET THREE TIMES A DAY       Allergies  Allergen Reactions  . Ace Inhibitors     AVOID ALL  BECAUSE OF HEART DISEASE   . Nitroglycerin Other (See Comments)    Reaction unknown  . Procan Holloway [Procainamide Hcl]     PT. HAS HEART DISEASE CALLED HYPERTHROPHIC CARDIOMYOPATHY  . Quinidine     PT. HAS HEART DISEASE CALLED HYPERTHROPHIC CARDIOMYOPATHY     Follow-up Information    Follow up with [name], MD. Schedule an appointment as soon as possible for a visit in 1 week.  Specialty:  Family Medicine   Why:  Hospital followup   Contact information:   [address] [place] [address] [phone]       Follow up with HAYES,JOHN C, MD. Schedule an appointment as soon as possible for a visit in 2 weeks.   Specialty:  Gastroenterology   Why:  Hospital followup   Contact information:   [address], [address] [place] [address] [phone]       Follow up with Lewayne Bunting, MD. Schedule an appointment as soon as possible for a visit in 1 week.   Specialty:  Cardiology   Why:  Hospital followup   Contact information:   [address] [place] [address] [phone]        The results of significant diagnostics from this hospitalization (including imaging, microbiology, ancillary and laboratory) are listed below for reference.    Significant Diagnostic Studies: Dg Chest 2 View  08/31/2014   CLINICAL DATA:  Hyperglycemia, shortness of breath  EXAM: CHEST  2 VIEW  COMPARISON:  06/14/2014  FINDINGS: Cardiomegaly is noted. Central mild vascular congestion without convincing pulmonary edema. For leads cardiac pacemaker is unchanged in position. Mild basilar atelectasis or infiltrate.  IMPRESSION: Central mild vascular congestion without convincing pulmonary edema. Mild basilar atelectasis or infiltrate. For leads cardiac pacemaker unchanged in position.   Electronically Signed   By: Natasha Mead M.D.    On: 08/31/2014 13:43   Ct Abdomen Pelvis W Contrast  09/04/2014   CLINICAL DATA:  58 year old male with elevated liver enzymes presenting with abdominal pain and lethargy.  EXAM: CT ABDOMEN AND PELVIS WITH CONTRAST  TECHNIQUE: Multidetector CT imaging of the abdomen and pelvis was performed using the standard protocol following bolus administration of intravenous contrast.  CONTRAST:  OMNIPAQUE IOHEXOL 300 MG/ML  SOLN  COMPARISON:  No priors.  FINDINGS: Lower chest: There are patchy areas of somewhat nodular ground-glass attenuation in the right lower lobe, typified by a 2.4 x 1.8 cm area in the medial aspect of the right lower lobe on image 7 of series 3. Atherosclerotic calcifications are noted in the left anterior descending, left circumflex and right coronary arteries. Cardiomegaly with some low-attenuation and rounding of the myocardium in the left ventricular apex, suggesting fibrofatty metaplasia from prior distal LAD territory myocardial infarction. In addition, there is a partially calcified filling defect in the apex of the left ventricle, suspicious for chronic thrombus (images 6 and 7 of series 2). Biventricular pacemaker leads are noted.  Hepatobiliary: The liver has a shrunken appearance and mildly nodular contour, suggestive of early changes of cirrhosis. No cystic or solid hepatic lesion. No intra or extrahepatic biliary ductal dilatation. Small calcified gallstone lying dependently in the gallbladder. No current findings to suggest acute cholecystitis at this time.  Pancreas: Mild fatty atrophy in the pancreas. There is subtle haziness in the peripancreatic fat. No peripancreatic fluid collections are identified at this time. No pancreatic ductal dilatation.  Spleen: Unremarkable. Two large splenules are noted adjacent to the splenic hilum.  Adrenals/Urinary Tract: Bilateral adrenal glands and bilateral kidneys are normal in appearance. No hydroureteronephrosis. Urinary bladder is normal  in appearance.  Stomach/Bowel: Normal appearance of the stomach. No pathologic dilatation of small bowel or colon. Numerous colonic diverticulae are noted. Accurate assessment for subtle early changes of acute diverticulitis is limited on today's examination secondary to the presence of ascites, however, no overt signs of inflammation are appreciated. Some circumferential thickening of the distal  rectum is noted, best appreciated on image 94 of series 2. No surrounding mesorectal lymphadenopathy. Normal appendix.  Vascular/Lymphatic: Extensive atherosclerosis throughout the abdominal and pelvic vasculature, without evidence of aneurysm. Multiple borderline enlarged and mildly enlarged upper abdominal lymph nodes, largest of which is in the hepatoduodenal ligament measuring 3.5 x 2.3 x 1.2 cm (image 27 of series 2).  Reproductive: Prostate gland and seminal vesicles are unremarkable in appearance.  Other: Small volume of ascites, most evident in the perihepatic region and in the anatomic pelvis. No pneumoperitoneum.  Musculoskeletal: There are no aggressive appearing lytic or blastic lesions noted in the visualized portions of the skeleton.  IMPRESSION: 1. Morphologic changes in the liver suggestive of early cirrhosis. No hypervascular lesion identified on today's study to suggest presence of a hepatocellular carcinoma at this time. There is a small volume of ascites. 2. Haziness in the peripancreatic fat. This is nonspecific, but clinical correlation for signs and symptoms of pancreatitis and assessment of lipase level is recommended. 3. Multiple borderline enlarged and mildly enlarged upper abdominal lymph nodes, largest of which measures 3.5 x 2.3 x 1.2 cm in the hepatoduodenal ligament. Enlarged lymph nodes are common in the setting of cirrhosis, and these may be benign, but attention to this region on any future followup examinations is recommended to ensure stability. 4. Extensive atherosclerosis, including at  least 3 vessel coronary artery disease. Importantly, there is evidence of prior distal LAD territory myocardial infarction, with some mild rounding of the apex of the left ventricle (implying the regional wall motion abnormality), and there is a partially calcified filling defect in the apex of the left ventricle which is likely to represent chronic thrombus. Correlation with echocardiography is strongly recommended (if one has not been already recently performed), as this places the patient at risk for systemic embolization. 5. Circumferential thickening of the distal rectum. This could simply represent a proctitis, however, the possibility of distal rectal neoplasm is not excluded, and correlation with colonoscopy is suggested in the near future if one has not been recently performed. 6. Multifocal slightly nodular appearing areas of ground-glass attenuation in the right lower lobe. These are nonspecific, and favored to be of infectious or inflammatory etiology. Initial follow-up by chest CT without contrast is recommended in 3 months to confirm persistence. This recommendation follows the consensus statement: Recommendations for the Management of Subsolid Pulmonary Nodules Detected at CT: A Statement from the Fleischner Society as published in Radiology 2013; 266:304-317. 7. Colonic diverticulosis without definitive findings to suggest acute diverticulitis at this time. 8. Normal appendix. 9. Cholelithiasis. 10. Additional incidental findings, as above.   Electronically Signed   By: Trudie Reed M.D.   On: 09/04/2014 09:01   US Renal  09/01/2014   CLINICAL DATA:  Acute renal failure, diabetes, hypertension  EXAM: RENAL/URINARY TRACT ULTRASOUND COMPLETE  COMPARISON:  06/11/2013  FINDINGS: Right Kidney:  Length: 11.2 cm. Echogenicity within normal limits. No mass or hydronephrosis visualized.  Left Kidney:  Length: 10.8 cm. Echogenicity within normal limits. No mass or hydronephrosis visualized.  Bladder:   Appears normal for degree of bladder distention.  Trace perihepatic ascites. Incidental note made of splenomegaly, 14.9 x 6.2 x 17 cm ( 821 cc) with small accessory splenules.  IMPRESSION: 1. Negative for hydronephrosis or other focal renal abnormality. 2. Trace amount of perihepatic ascites and splenomegaly incidentally noted.   Electronically Signed   By: Oley Balm M.D.   On: 09/01/2014 15:00    Microbiology: Recent Results (from the past  240 hour(s))  MRSA PCR Screening     Status: None   Collection Time: 08/31/14  5:46 PM  Result Value Ref Range Status   MRSA by PCR NEGATIVE NEGATIVE Final    Comment:        The GeneXpert MRSA Assay (FDA approved for NASAL specimens only), is one component of a comprehensive MRSA colonization surveillance program. It is not intended to diagnose MRSA infection nor to guide or monitor treatment for MRSA infections.   Blood culture (routine x 2)     Status: None (Preliminary result)   Collection Time: 08/31/14  6:55 PM  Result Value Ref Range Status   Specimen Description BLOOD LEFT ARM  Final   Special Requests BOTTLES DRAWN AEROBIC AND ANAEROBIC 8CC  Final   Culture  Setup Time   Final    09/01/2014 01:01 Performed at [facility]    Culture   Final           BLOOD CULTURE RECEIVED NO GROWTH TO DATE CULTURE WILL BE HELD FOR 5 DAYS BEFORE ISSUING A FINAL NEGATIVE REPORT Performed at [facility]    Report Status PENDING  Incomplete  Blood culture (routine x 2)     Status: None (Preliminary result)   Collection Time: 08/31/14  7:05 PM  Result Value Ref Range Status   Specimen Description BLOOD LEFT HAND  Final   Special Requests BOTTLES DRAWN AEROBIC AND ANAEROBIC 10CC  Final   Culture  Setup Time   Final    09/01/2014 01:02 Performed at [facility]    Culture   Final           BLOOD CULTURE RECEIVED NO GROWTH TO DATE CULTURE WILL BE HELD FOR 5 DAYS BEFORE ISSUING A FINAL NEGATIVE REPORT Performed at  [facility]    Report Status PENDING  Incomplete     Labs: Basic Metabolic Panel:  Recent Labs Lab 09/01/14 0210 09/02/14 0419 09/03/14 0255 09/04/14 0612 09/05/14 0415  NA 137 135* 137 140 138  K 5.6* 5.5* 4.6 4.4 4.1  CL 101 100 100 105 103  CO2 18* 19 20 24 23   GLUCOSE 287* 257* 261* 162* 151*  BUN 50* 63* 61* 43* 34*  CREATININE 2.24* 2.38* 2.13* 1.79* 1.70*  CALCIUM 8.5 8.6 8.6 8.7 8.5  MG  --   --  1.7  --   --    Liver Function Tests:  Recent Labs Lab 08/31/14 1350 09/01/14 0210 09/03/14 0255 09/04/14 0612 09/05/14 0415  AST 87* 62* 89* 94* 125*  ALT 49 47 55* 63* 80*  ALKPHOS 124* 113 122* 114 133*  BILITOT 0.6 0.5 0.5 0.5 0.4  PROT 6.7 6.4 6.3 6.4 6.0  ALBUMIN 2.6* 2.4* 2.6* 2.5* 2.4*   No results for input(s): LIPASE, AMYLASE in the last 168 hours.  Recent Labs Lab 09/02/14 1336 09/04/14 0824 09/05/14 0406 09/06/14 0420  AMMONIA 108* 54 85* 88*   CBC:  Recent Labs Lab 08/31/14 1350 09/01/14 0210 09/02/14 0419 09/03/14 0255 09/04/14 0612 09/05/14 0415  WBC 5.9 5.3 9.4 6.6 5.0 4.4  NEUTROABS 4.9 4.7  --   --   --   --   HGB 10.7* 9.8* 9.4* 9.2* 9.3* 8.9*  HCT 33.4* 30.9* 29.6* 29.3* 29.7* 28.8*  MCV 83.9 82.8 82.9 83.5 83.9 83.7  PLT 74* 67* 81* 77* 84* 80*   Cardiac Enzymes:  Recent Labs Lab 09/01/14 0210  CKTOTAL 130   BNP: BNP (last 3 results)  Recent Labs  10/01/13 0431 08/31/14 1350 09/03/14 0255  PROBNP 19037.0* 1597.0* 1552.0*   CBG:  Recent Labs Lab 09/05/14 0554 09/05/14 1117 09/05/14 1651 09/05/14 2134 09/06/14 0644  GLUCAP 137* 142* 148* 153* 72       Signed:  Althea Backs  [facility] 09/06/2014, 10:03 AM

## 2014-09-07 LAB — CULTURE, BLOOD (ROUTINE X 2)
CULTURE: NO GROWTH
Culture: NO GROWTH

## 2014-09-11 ENCOUNTER — Encounter: Payer: Self-pay | Admitting: Internal Medicine

## 2014-09-11 ENCOUNTER — Other Ambulatory Visit: Payer: Self-pay | Admitting: Internal Medicine

## 2014-09-11 ENCOUNTER — Ambulatory Visit (INDEPENDENT_AMBULATORY_CARE_PROVIDER_SITE_OTHER): Payer: BC Managed Care – PPO | Admitting: Internal Medicine

## 2014-09-11 VITALS — BP 96/52 | HR 78 | Ht 73.0 in | Wt 326.0 lb

## 2014-09-11 DIAGNOSIS — I5022 Chronic systolic (congestive) heart failure: Secondary | ICD-10-CM

## 2014-09-11 DIAGNOSIS — T462X1A Poisoning by other antidysrhythmic drugs, accidental (unintentional), initial encounter: Secondary | ICD-10-CM | POA: Insufficient documentation

## 2014-09-11 DIAGNOSIS — N259 Disorder resulting from impaired renal tubular function, unspecified: Secondary | ICD-10-CM

## 2014-09-11 DIAGNOSIS — I421 Obstructive hypertrophic cardiomyopathy: Secondary | ICD-10-CM

## 2014-09-11 DIAGNOSIS — Z95810 Presence of automatic (implantable) cardiac defibrillator: Secondary | ICD-10-CM

## 2014-09-11 DIAGNOSIS — T462X1S Poisoning by other antidysrhythmic drugs, accidental (unintentional), sequela: Secondary | ICD-10-CM

## 2014-09-11 DIAGNOSIS — I472 Ventricular tachycardia, unspecified: Secondary | ICD-10-CM

## 2014-09-11 DIAGNOSIS — I442 Atrioventricular block, complete: Secondary | ICD-10-CM

## 2014-09-11 LAB — MDC_IDC_ENUM_SESS_TYPE_INCLINIC
Battery Voltage: 2.97 V
Brady Statistic AP VS Percent: 0.11 %
Brady Statistic AS VP Percent: 0.08 %
HIGH POWER IMPEDANCE MEASURED VALUE: 40 Ohm
HighPow Impedance: 209 Ohm
HighPow Impedance: 54 Ohm
Lead Channel Impedance Value: 209 Ohm
Lead Channel Impedance Value: 380 Ohm
Lead Channel Impedance Value: 456 Ohm
Lead Channel Pacing Threshold Amplitude: 0.875 V
Lead Channel Pacing Threshold Pulse Width: 0.4 ms
Lead Channel Pacing Threshold Pulse Width: 0.8 ms
Lead Channel Sensing Intrinsic Amplitude: 1.875 mV
Lead Channel Sensing Intrinsic Amplitude: 2.25 mV
Lead Channel Sensing Intrinsic Amplitude: 6 mV
Lead Channel Sensing Intrinsic Amplitude: 6 mV
Lead Channel Setting Pacing Amplitude: 2 V
Lead Channel Setting Pacing Amplitude: 2 V
Lead Channel Setting Pacing Amplitude: 2.5 V
Lead Channel Setting Pacing Pulse Width: 0.6 ms
Lead Channel Setting Pacing Pulse Width: 0.8 ms
Lead Channel Setting Sensing Sensitivity: 0.3 mV
MDC IDC MSMT BATTERY REMAINING LONGEVITY: 57 mo
MDC IDC MSMT LEADCHNL LV IMPEDANCE VALUE: 342 Ohm
MDC IDC MSMT LEADCHNL RA IMPEDANCE VALUE: 380 Ohm
MDC IDC MSMT LEADCHNL RA PACING THRESHOLD AMPLITUDE: 0.625 V
MDC IDC MSMT LEADCHNL RA PACING THRESHOLD PULSEWIDTH: 0.4 ms
MDC IDC MSMT LEADCHNL RV PACING THRESHOLD AMPLITUDE: 1.375 V
MDC IDC SESS DTM: 20151209153948
MDC IDC SET ZONE DETECTION INTERVAL: 280 ms
MDC IDC STAT BRADY AP VP PERCENT: 99.82 %
MDC IDC STAT BRADY AS VS PERCENT: 0 %
MDC IDC STAT BRADY RA PERCENT PACED: 99.92 %
MDC IDC STAT BRADY RV PERCENT PACED: 99.68 %
Zone Setting Detection Interval: 350 ms
Zone Setting Detection Interval: 350 ms
Zone Setting Detection Interval: 500 ms
Zone Setting Detection Interval: 510 ms

## 2014-09-11 NOTE — Progress Notes (Signed)
HPI [name] returns today for followup. He is a very pleasant middle-age man with a hypertrophic cardiomyopathy, chronic systolic heart failure, complete heart block, and ventricular tachycardia. He has been on amiodarone and mexiletine. His ventricular tachycardia has been fairly well-controlled with a one episode of ICD shock in the last 6 months. His heart failure remains class III. The patient denies syncope. He has mild peripheral edema. He was hospitalized last week with hypoglycemia and thought to have an upper respiratory illness. He was found to have volume overload, worsening renal failure, and cirrhosis. Review of the patient's liver function tests demonstrates that he has had hepatitis for several months. 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 Of course other etiologies are present as well including obesity. He has not had any ICD shock for several months. Allergies  Allergen Reactions  . Ace Inhibitors     AVOID ALL  BECAUSE OF HEART DISEASE   . Nitroglycerin Other (See Comments)    Reaction unknown  . Procan Sr [Procainamide Hcl]     PT. HAS HEART DISEASE CALLED HYPERTHROPHIC CARDIOMYOPATHY  . Quinidine     PT. HAS HEART DISEASE CALLED HYPERTHROPHIC CARDIOMYOPATHY       Current Outpatient Prescriptions  Medication Sig Dispense Refill  . amiodarone (PACERONE) 400 MG tablet Take 0.5-1 tablets (200-400 mg total) by mouth daily. Take 200mg  on Sat and Sun take 400mg  all other days (Patient taking differently: Take 200 mg by mouth on Sat and Sun take 400 mg by mouth all other days) 90 tablet 3  . [START ON 09/13/2014] apixaban (ELIQUIS) 5 MG TABS tablet Take 1 tablet (5 mg total) by mouth 2 (two) times daily. 60 tablet 0  . carvedilol (COREG) 25 MG tablet Take 1 tablet (25 mg total) by mouth 2 (two) times daily with a meal. 180 tablet 2  . cholecalciferol (VITAMIN D) 1000 UNITS  tablet Take 2,000 Units by mouth daily.     . fish oil-omega-3 fatty acids 1000 MG capsule Take 3 capsules by mouth daily.     . furosemide (LASIX) 40 MG tablet Take 40 mg by mouth daily.     . insulin glargine (LANTUS) 100 UNIT/ML injection Inject 50 Units into the skin 2 (two) times daily.  90 mL 3  . insulin lispro (HUMALOG KWIKPEN) 100 UNIT/ML KiwkPen Inject 16 units with largest/evening meal.Dispense quikpens (Patient taking differently: Inject 16 units with largest/evening meal.Dispense quikpens) 30 mL 11  . lactulose (CHRONULAC) 10 GM/15ML solution Take 30 mLs (20 g total) by mouth 3 (three) times daily. To maintain 2-3 loose bowel movements per day. 240 mL 0  . levothyroxine (SYNTHROID, LEVOTHROID) 150 MCG tablet Take 150 mcg by mouth daily before breakfast.    . levothyroxine (SYNTHROID, LEVOTHROID) 75 MCG tablet TAKE 1/2 TABLET (=37.5MCG) ALONG WITH LEVOTHYROXINE 150MCG TO EQUAL 187.5MCG DAILY. (Patient taking differently: TAKE 1/2 TABLET (=37.5MCG) BY MOUTH ALONG WITH LEVOTHYROXINE 150MCG TO EQUAL 187.5MCG DAILY.) 30 tablet 1  . lisinopril (PRINIVIL,ZESTRIL) 2.5 MG tablet TAKE 1 TABLET TWICE A DAY (Patient taking differently: TAKE 1 TABLET BY MOUTH TWICE A DAY) 180 tablet 1  . mexiletine (MEXITIL) 150 MG capsule TAKE 1 CAPSULE EVERY 8 HOURS (Patient taking differently: TAKE 1 CAPSULE BY MOUTH EVERY 8 HOURS) 270 capsule 3  . rosuvastatin (CRESTOR) 20 MG tablet Take 0.5 tablets (10 mg total) by mouth daily. 28 tablet 0  .  sitaGLIPtin (JANUVIA) 50 MG tablet Take 1 tablet (50 mg total) by mouth daily. 30 tablet 2  . WELCHOL 625 MG tablet TAKE 1 TABLET THREE TIMES A DAY (Patient taking differently: TAKE 1 TABLET BY MOUTH THREE TIMES A DAY) 270 tablet 3   No current facility-administered medications for this visit.     Past Medical History  Diagnosis Date  . DM   . HYPERLIPIDEMIA-MIXED   . Obesity, unspecified   . HYPERTENSION, UNSPECIFIED   . CAD (coronary artery disease)     a.  Nonobstructive moderate CAD by cath - last 2012.  [name] Hypertrophic obstructive cardiomyopathy   . Complete heart block     a. s/p pacemaker later upgraded to CRT-D.  [name] CKD (chronic kidney disease)     a. Baseline Cr 1.4-1.5.  [name] Chronic systolic CHF (congestive heart failure)     a. NICM (EF 25% in 2012, 20-25% in 09/2013). b. s/p BiV-ICD (MDT).  . Colon polyps   . Ventricular tachycardia     a. EPS/RFCA of VT in 2012. b. On amio, mexilitene. c. Last BiV-ICD gen change 05/2013 (MDT).  . Hypothyroidism   . Fatty liver disease, nonalcoholic   . BPH (benign prostatic hyperplasia)   . Habitual alcohol use     ROS:   All systems reviewed and negative except as noted in the HPI.   Past Surgical History  Procedure Laterality Date  . Pacemaker insertion      medtronic  . Cardiac catheterization    . Facial reconstruction surgery      after trauma in 2006     Family History  Problem Relation Age of Onset  . Diabetic kidney disease Father   . Coronary artery disease Neg Hx      History   Social History  . Marital Status: Married    Spouse Name: N/A    Number of Children: N/A  . Years of Education: N/A   Occupational History  . Not on file.   Social History Main Topics  . Smoking status: Former Smoker    Types: Cigarettes    Quit date: 10/04/1968  . Smokeless tobacco: Never Used     Comment: 30+ yrs  . Alcohol Use: Yes     Comment: about 1 cup of liquor daily (straight liquor - not mixed)  . Drug Use: No  . Sexual Activity: Not on file   Other Topics Concern  . Not on file   Social History Narrative     BP 96/52 mmHg  Pulse 78  Ht 6\' 1"  (1.854 m)  Wt 326 lb (147.873 kg)  BMI 43.02 kg/m2  Physical Exam:  stable appearing obese, middle-aged man,NAD HEENT: Unremarkable Neck:  7 cm JVD, no thyromegally Back:  No CVA tenderness Lungs:  Clear with no wheezes, rales, or rhonchi. HEART:  Regular rate rhythm, no murmurs, no rubs, no clicks Abd:  soft,  positive bowel sounds, no organomegally, no rebound, no guarding Ext:  2 plus pulses, 2+ peripheral edema, no cyanosis, no clubbing Skin:  No rashes no nodules Neuro:  CN II through XII intact, motor grossly intact  DEVICE  Normal device function.  See PaceArt for details. Device at elective replacement  Assess/Plan:

## 2014-09-11 NOTE — Assessment & Plan Note (Signed)
Interrogation of his Medtronic biventricular ICD demonstrates normal function. We'll plan to recheck in several months.

## 2014-09-11 NOTE — Assessment & Plan Note (Signed)
The patient has had nice control of his ventricular tachycardia. He will continue on mexiletine. We will stop his amiodarone.

## 2014-09-11 NOTE — Patient Instructions (Addendum)
Your physician recommends that you schedule a follow-up appointment in: 3 months with Dr. Ladona Ridgelaylor   Your physician has recommended you make the following change in your medication:  1) Stop Amiodarone  Low-Sodium Eating Plan Sodium raises blood pressure and causes water to be held in the body. Getting less sodium from food will help lower your blood pressure, reduce any swelling, and protect your heart, liver, and kidneys. We get sodium by adding salt (sodium chloride) to food. Most of our sodium comes from canned, boxed, and frozen foods. Restaurant foods, fast foods, and pizza are also very high in sodium. Even if you take medicine to lower your blood pressure or to reduce fluid in your body, getting less sodium from your food is important. WHAT IS MY PLAN? Most people should limit their sodium intake to 2,300 mg a day. Your health care provider recommends that you limit your sodium intake to 1,000mg  a day.  WHAT DO I NEED TO KNOW ABOUT THIS EATING PLAN? For the low-sodium eating plan, you will follow these general guidelines:  Choose foods with a % Daily Value for sodium of less than 5% (as listed on the food label).   Use salt-free seasonings or herbs instead of table salt or sea salt.   Check with your health care provider or pharmacist before using salt substitutes.   Eat fresh foods.  Eat more vegetables and fruits.  Limit canned vegetables. If you do use them, rinse them well to decrease the sodium.   Limit cheese to 1 oz (28 g) per day.   Eat lower-sodium products, often labeled as "lower sodium" or "no salt added."  Avoid foods that contain monosodium glutamate (MSG). MSG is sometimes added to [place] food and some canned foods.  Check food labels (Nutrition Facts labels) on foods to learn how much sodium is in one serving.  Eat more home-cooked food and less restaurant, buffet, and fast food.  When eating at a restaurant, ask that your food be prepared with less  salt or none, if possible.  HOW DO I READ FOOD LABELS FOR SODIUM INFORMATION? The Nutrition Facts label lists the amount of sodium in one serving of the food. If you eat more than one serving, you must multiply the listed amount of sodium by the number of servings. Food labels may also identify foods as:  Sodium free--Less than 5 mg in a serving.  Very low sodium--35 mg or less in a serving.  Low sodium--140 mg or less in a serving.  Light in sodium--50% less sodium in a serving. For example, if a food that usually has 300 mg of sodium is changed to become light in sodium, it will have 150 mg of sodium.  Reduced sodium--25% less sodium in a serving. For example, if a food that usually has 400 mg of sodium is changed to reduced sodium, it will have 300 mg of sodium. WHAT FOODS CAN I EAT? Grains Low-sodium cereals, including oats, puffed wheat and rice, and shredded wheat cereals. Low-sodium crackers. Unsalted rice and pasta. Lower-sodium bread.  Vegetables Frozen or fresh vegetables. Low-sodium or reduced-sodium canned vegetables. Low-sodium or reduced-sodium tomato sauce and paste. Low-sodium or reduced-sodium tomato and vegetable juices.  Fruits Fresh, frozen, and canned fruit. Fruit juice.  Meat and Other Protein Products Low-sodium canned tuna and salmon. Fresh or frozen meat, poultry, seafood, and fish. Lamb. Unsalted nuts. Dried beans, peas, and lentils without added salt. Unsalted canned beans. Homemade soups without salt. Eggs.  Dairy Milk. Soy  milk. Ricotta cheese. Low-sodium or reduced-sodium cheeses. Yogurt.  Condiments Fresh and dried herbs and spices. Salt-free seasonings. Onion and garlic powders. Low-sodium varieties of mustard and ketchup. Lemon juice.  Fats and Oils Reduced-sodium salad dressings. Unsalted butter.  Other Unsalted popcorn and pretzels.  The items listed above may not be a complete list of recommended foods or beverages. Contact your dietitian  for more options. WHAT FOODS ARE NOT RECOMMENDED? Grains Instant hot cereals. Bread stuffing, pancake, and biscuit mixes. Croutons. Seasoned rice or pasta mixes. Noodle soup cups. Boxed or frozen macaroni and cheese. Self-rising flour. Regular salted crackers. Vegetables Regular canned vegetables. Regular canned tomato sauce and paste. Regular tomato and vegetable juices. Frozen vegetables in sauces. Salted french fries. Olives. Rosita Fire. Relishes. Sauerkraut. Salsa. Meat and Other Protein Products Salted, canned, smoked, spiced, or pickled meats, seafood, or fish. Bacon, ham, sausage, hot dogs, corned beef, chipped beef, and packaged luncheon meats. Salt pork. Jerky. Pickled herring. Anchovies, regular canned tuna, and sardines. Salted nuts. Dairy Processed cheese and cheese spreads. Cheese curds. Blue cheese and cottage cheese. Buttermilk.  Condiments Onion and garlic salt, seasoned salt, table salt, and sea salt. Canned and packaged gravies. Worcestershire sauce. Tartar sauce. Barbecue sauce. Teriyaki sauce. Soy sauce, including reduced sodium. Steak sauce. Fish sauce. Oyster sauce. Cocktail sauce. Horseradish. Regular ketchup and mustard. Meat flavorings and tenderizers. Bouillon cubes. Hot sauce. Tabasco sauce. Marinades. Taco seasonings. Relishes. Fats and Oils Regular salad dressings. Salted butter. Margarine. Ghee. Bacon fat.  Other Potato and tortilla chips. Corn chips and puffs. Salted popcorn and pretzels. Canned or dried soups. Pizza. Frozen entrees and pot pies.  The items listed above may not be a complete list of foods and beverages to avoid. Contact your dietitian for more information. Document Released: 03/12/2002 Document Revised: 09/25/2013 Document Reviewed: 07/25/2013 [facility] Patient Information 2015 [place], [place]. This information is not intended to replace advice given to you by your health care provider. Make sure you discuss any questions you have with your  health care provider.

## 2014-09-11 NOTE — Assessment & Plan Note (Signed)
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 While he desperately needs amiodarone because of ventricular tachycardia, I have asked the patient to stop amiodarone at least until he sees a liver specialist. We'll plan to see him back in 3 months. He will undergo watchful waiting.

## 2014-09-11 NOTE — Assessment & Plan Note (Signed)
The patient's chronic systolic heart failure remains class III. He will continue his current medications. We discussed the importance of a low-sodium diet today.

## 2014-09-11 NOTE — Assessment & Plan Note (Signed)
The patient has stage V chronic renal insufficiency. His creatinine has been stable. We discussed diuretic therapy today in the setting of his renal dysfunction. He will continue low-dose furosemide. I encouraged a low-sodium diet.

## 2014-09-12 ENCOUNTER — Encounter (HOSPITAL_COMMUNITY): Payer: Self-pay | Admitting: Internal Medicine

## 2014-09-23 ENCOUNTER — Other Ambulatory Visit: Payer: Self-pay

## 2014-09-23 ENCOUNTER — Telehealth: Payer: Self-pay

## 2014-09-23 MED ORDER — APIXABAN 5 MG PO TABS: 5.0000 mg | ORAL_TABLET | Freq: Two times a day (BID) | ORAL | Status: DC

## 2014-09-23 NOTE — Telephone Encounter (Signed)
Patient's wife call to let us know how he is taking this med

## 2014-09-23 NOTE — Telephone Encounter (Signed)
5 mg twice daily. 

## 2014-09-30 ENCOUNTER — Other Ambulatory Visit: Payer: Self-pay

## 2014-10-02 ENCOUNTER — Inpatient Hospital Stay (HOSPITAL_COMMUNITY)
Admission: EM | Admit: 2014-10-02 | Discharge: 2014-10-11 | DRG: 280 | Disposition: A | Discharge status: Home / Self Care | Payer: Medicare Other | Attending: Internal Medicine | Admitting: Internal Medicine

## 2014-10-02 ENCOUNTER — Encounter (HOSPITAL_COMMUNITY): Payer: Self-pay | Admitting: Emergency Medicine

## 2014-10-02 ENCOUNTER — Inpatient Hospital Stay (HOSPITAL_COMMUNITY): Payer: Medicare Other

## 2014-10-02 ENCOUNTER — Emergency Department (HOSPITAL_COMMUNITY): Payer: Medicare Other

## 2014-10-02 DIAGNOSIS — Z888 Allergy status to other drugs, medicaments and biological substances status: Secondary | ICD-10-CM | POA: Diagnosis not present

## 2014-10-02 DIAGNOSIS — J9620 Acute and chronic respiratory failure, unspecified whether with hypoxia or hypercapnia: Secondary | ICD-10-CM | POA: Diagnosis present

## 2014-10-02 DIAGNOSIS — I10 Essential (primary) hypertension: Secondary | ICD-10-CM | POA: Diagnosis present

## 2014-10-02 DIAGNOSIS — I129 Hypertensive chronic kidney disease with stage 1 through stage 4 chronic kidney disease, or unspecified chronic kidney disease: Secondary | ICD-10-CM | POA: Diagnosis present

## 2014-10-02 DIAGNOSIS — N179 Acute kidney failure, unspecified: Secondary | ICD-10-CM | POA: Insufficient documentation

## 2014-10-02 DIAGNOSIS — K766 Portal hypertension: Secondary | ICD-10-CM | POA: Diagnosis present

## 2014-10-02 DIAGNOSIS — I472 Ventricular tachycardia, unspecified: Secondary | ICD-10-CM

## 2014-10-02 DIAGNOSIS — N184 Chronic kidney disease, stage 4 (severe): Secondary | ICD-10-CM | POA: Diagnosis present

## 2014-10-02 DIAGNOSIS — M7120 Synovial cyst of popliteal space [Baker], unspecified knee: Secondary | ICD-10-CM | POA: Diagnosis present

## 2014-10-02 DIAGNOSIS — Z86010 Personal history of colonic polyps: Secondary | ICD-10-CM | POA: Diagnosis not present

## 2014-10-02 DIAGNOSIS — N320 Bladder-neck obstruction: Secondary | ICD-10-CM | POA: Diagnosis present

## 2014-10-02 DIAGNOSIS — R188 Other ascites: Secondary | ICD-10-CM | POA: Insufficient documentation

## 2014-10-02 DIAGNOSIS — N189 Chronic kidney disease, unspecified: Secondary | ICD-10-CM | POA: Diagnosis present

## 2014-10-02 DIAGNOSIS — E119 Type 2 diabetes mellitus without complications: Secondary | ICD-10-CM | POA: Diagnosis present

## 2014-10-02 DIAGNOSIS — I513 Intracardiac thrombosis, not elsewhere classified: Secondary | ICD-10-CM | POA: Diagnosis present

## 2014-10-02 DIAGNOSIS — E875 Hyperkalemia: Secondary | ICD-10-CM | POA: Diagnosis present

## 2014-10-02 DIAGNOSIS — Z794 Long term (current) use of insulin: Secondary | ICD-10-CM | POA: Diagnosis not present

## 2014-10-02 DIAGNOSIS — D696 Thrombocytopenia, unspecified: Secondary | ICD-10-CM | POA: Diagnosis present

## 2014-10-02 DIAGNOSIS — N400 Enlarged prostate without lower urinary tract symptoms: Secondary | ICD-10-CM | POA: Diagnosis present

## 2014-10-02 DIAGNOSIS — Z95810 Presence of automatic (implantable) cardiac defibrillator: Secondary | ICD-10-CM | POA: Diagnosis not present

## 2014-10-02 DIAGNOSIS — I2510 Atherosclerotic heart disease of native coronary artery without angina pectoris: Secondary | ICD-10-CM | POA: Diagnosis present

## 2014-10-02 DIAGNOSIS — R059 Cough, unspecified: Secondary | ICD-10-CM

## 2014-10-02 DIAGNOSIS — Z87891 Personal history of nicotine dependence: Secondary | ICD-10-CM | POA: Diagnosis not present

## 2014-10-02 DIAGNOSIS — K7460 Unspecified cirrhosis of liver: Secondary | ICD-10-CM | POA: Diagnosis present

## 2014-10-02 DIAGNOSIS — E669 Obesity, unspecified: Secondary | ICD-10-CM | POA: Diagnosis present

## 2014-10-02 DIAGNOSIS — R339 Retention of urine, unspecified: Secondary | ICD-10-CM

## 2014-10-02 DIAGNOSIS — I422 Other hypertrophic cardiomyopathy: Secondary | ICD-10-CM | POA: Diagnosis present

## 2014-10-02 DIAGNOSIS — E785 Hyperlipidemia, unspecified: Secondary | ICD-10-CM | POA: Diagnosis present

## 2014-10-02 DIAGNOSIS — D649 Anemia, unspecified: Secondary | ICD-10-CM | POA: Diagnosis present

## 2014-10-02 DIAGNOSIS — I5043 Acute on chronic combined systolic (congestive) and diastolic (congestive) heart failure: Principal | ICD-10-CM | POA: Diagnosis present

## 2014-10-02 DIAGNOSIS — Z6841 Body Mass Index (BMI) 40.0 and over, adult: Secondary | ICD-10-CM | POA: Diagnosis not present

## 2014-10-02 DIAGNOSIS — R0602 Shortness of breath: Secondary | ICD-10-CM | POA: Diagnosis present

## 2014-10-02 DIAGNOSIS — I421 Obstructive hypertrophic cardiomyopathy: Secondary | ICD-10-CM | POA: Diagnosis present

## 2014-10-02 DIAGNOSIS — K760 Fatty (change of) liver, not elsewhere classified: Secondary | ICD-10-CM | POA: Diagnosis present

## 2014-10-02 DIAGNOSIS — I5023 Acute on chronic systolic (congestive) heart failure: Secondary | ICD-10-CM

## 2014-10-02 DIAGNOSIS — R05 Cough: Secondary | ICD-10-CM

## 2014-10-02 DIAGNOSIS — E039 Hypothyroidism, unspecified: Secondary | ICD-10-CM | POA: Diagnosis present

## 2014-10-02 DIAGNOSIS — N289 Disorder of kidney and ureter, unspecified: Secondary | ICD-10-CM

## 2014-10-02 DIAGNOSIS — Z7901 Long term (current) use of anticoagulants: Secondary | ICD-10-CM | POA: Diagnosis not present

## 2014-10-02 DIAGNOSIS — I214 Non-ST elevation (NSTEMI) myocardial infarction: Secondary | ICD-10-CM | POA: Diagnosis present

## 2014-10-02 DIAGNOSIS — M7989 Other specified soft tissue disorders: Secondary | ICD-10-CM | POA: Insufficient documentation

## 2014-10-02 HISTORY — DX: Unspecified cirrhosis of liver: K74.60

## 2014-10-02 HISTORY — DX: Presence of automatic (implantable) cardiac defibrillator: Z95.810

## 2014-10-02 LAB — GLUCOSE, CAPILLARY
GLUCOSE-CAPILLARY: 96 mg/dL (ref 70–99)
GLUCOSE-CAPILLARY: 197 mg/dL — AB (ref 70–99)
Glucose-Capillary: 138 mg/dL — ABNORMAL HIGH (ref 70–99)
Glucose-Capillary: 185 mg/dL — ABNORMAL HIGH (ref 70–99)

## 2014-10-02 LAB — CBC
HEMATOCRIT: 29.2 % — AB (ref 39.0–52.0)
HEMOGLOBIN: 9 g/dL — AB (ref 13.0–17.0)
MCH: 26.4 pg (ref 26.0–34.0)
MCHC: 30.8 g/dL (ref 30.0–36.0)
MCV: 85.6 fL (ref 78.0–100.0)
Platelets: 75 10*3/uL — ABNORMAL LOW (ref 150–400)
RBC: 3.41 MIL/uL — ABNORMAL LOW (ref 4.22–5.81)
RDW: 15.7 % — ABNORMAL HIGH (ref 11.5–15.5)
WBC: 6 10*3/uL (ref 4.0–10.5)

## 2014-10-02 LAB — URINALYSIS, ROUTINE W REFLEX MICROSCOPIC
BILIRUBIN URINE: NEGATIVE
GLUCOSE, UA: NEGATIVE mg/dL
Hgb urine dipstick: NEGATIVE
Ketones, ur: NEGATIVE mg/dL
Leukocytes, UA: NEGATIVE
NITRITE: NEGATIVE
PH: 5 (ref 5.0–8.0)
PROTEIN: 100 mg/dL — AB
SPECIFIC GRAVITY, URINE: 1.016 (ref 1.005–1.030)
Urobilinogen, UA: 0.2 mg/dL (ref 0.0–1.0)

## 2014-10-02 LAB — COMPREHENSIVE METABOLIC PANEL
ALT: 38 U/L (ref 0–53)
ANION GAP: 6 (ref 5–15)
AST: 77 U/L — ABNORMAL HIGH (ref 0–37)
Albumin: 2.9 g/dL — ABNORMAL LOW (ref 3.5–5.2)
Alkaline Phosphatase: 86 U/L (ref 39–117)
BUN: 63 mg/dL — AB (ref 6–23)
CALCIUM: 8.9 mg/dL (ref 8.4–10.5)
CO2: 20 mmol/L (ref 19–32)
Chloride: 114 mEq/L — ABNORMAL HIGH (ref 96–112)
Creatinine, Ser: 3.15 mg/dL — ABNORMAL HIGH (ref 0.50–1.35)
GFR calc non Af Amer: 20 mL/min — ABNORMAL LOW (ref >90)
GFR, EST AFRICAN AMERICAN: 23 mL/min — AB (ref >90)
GLUCOSE: 133 mg/dL — AB (ref 70–99)
Potassium: 6.4 mmol/L (ref 3.5–5.1)
SODIUM: 140 mmol/L (ref 135–145)
Total Bilirubin: 0.4 mg/dL (ref 0.3–1.2)
Total Protein: 6.2 g/dL (ref 6.0–8.3)

## 2014-10-02 LAB — URINE MICROSCOPIC-ADD ON

## 2014-10-02 LAB — I-STAT TROPONIN, ED: TROPONIN I, POC: 0.05 ng/mL (ref 0.00–0.08)

## 2014-10-02 LAB — BRAIN NATRIURETIC PEPTIDE: B Natriuretic Peptide: 736.5 pg/mL — ABNORMAL HIGH (ref 0.0–100.0)

## 2014-10-02 LAB — TROPONIN I: Troponin I: 0.2 ng/mL — ABNORMAL HIGH (ref <0.031)

## 2014-10-02 MED ORDER — SODIUM CHLORIDE 0.9 % IV SOLN: INTRAVENOUS | Status: DC

## 2014-10-02 MED ORDER — INSULIN GLARGINE 100 UNIT/ML ~~LOC~~ SOLN
50.0000 [IU] | Freq: Two times a day (BID) | SUBCUTANEOUS | Status: DC
  Administered 2014-10-02 – 2014-10-11 (×18): 50 [IU] via SUBCUTANEOUS
  Filled 2014-10-02 (×20): qty 0.5

## 2014-10-02 MED ORDER — SODIUM POLYSTYRENE SULFONATE 15 GM/60ML PO SUSP
30.0000 g | Freq: Once | ORAL | Status: AC
  Administered 2014-10-02: 30 g via ORAL
  Filled 2014-10-02: qty 120

## 2014-10-02 MED ORDER — INSULIN ASPART 100 UNIT/ML ~~LOC~~ SOLN
0.0000 [IU] | Freq: Three times a day (TID) | SUBCUTANEOUS | Status: DC
  Administered 2014-10-02: 2 [IU] via SUBCUTANEOUS
  Administered 2014-10-03: 3 [IU] via SUBCUTANEOUS
  Administered 2014-10-03: 2 [IU] via SUBCUTANEOUS
  Administered 2014-10-03 – 2014-10-04 (×3): 3 [IU] via SUBCUTANEOUS
  Administered 2014-10-05 (×2): 2 [IU] via SUBCUTANEOUS
  Administered 2014-10-07: 3 [IU] via SUBCUTANEOUS
  Administered 2014-10-07: 2 [IU] via SUBCUTANEOUS
  Administered 2014-10-08: 5 [IU] via SUBCUTANEOUS
  Administered 2014-10-08: 2 [IU] via SUBCUTANEOUS
  Administered 2014-10-08: 3 [IU] via SUBCUTANEOUS
  Administered 2014-10-09: 2 [IU] via SUBCUTANEOUS
  Administered 2014-10-09: 5 [IU] via SUBCUTANEOUS
  Administered 2014-10-09 – 2014-10-10 (×2): 2 [IU] via SUBCUTANEOUS

## 2014-10-02 MED ORDER — INSULIN ASPART 100 UNIT/ML ~~LOC~~ SOLN: 0.0000 [IU] | Freq: Every day | SUBCUTANEOUS | Status: DC

## 2014-10-02 MED ORDER — MEXILETINE HCL 150 MG PO CAPS
150.0000 mg | ORAL_CAPSULE | Freq: Three times a day (TID) | ORAL | Status: DC
  Administered 2014-10-02 – 2014-10-11 (×27): 150 mg via ORAL
  Filled 2014-10-02 (×30): qty 1

## 2014-10-02 MED ORDER — ROSUVASTATIN CALCIUM 10 MG PO TABS
10.0000 mg | ORAL_TABLET | Freq: Every day | ORAL | Status: DC
  Administered 2014-10-02 – 2014-10-10 (×9): 10 mg via ORAL
  Filled 2014-10-02 (×10): qty 1

## 2014-10-02 MED ORDER — SODIUM BICARBONATE 8.4 % IV SOLN
50.0000 meq | Freq: Once | INTRAVENOUS | Status: AC
  Administered 2014-10-02: 50 meq via INTRAVENOUS
  Filled 2014-10-02: qty 50

## 2014-10-02 MED ORDER — CALCIUM CHLORIDE 10 % IV SOLN
1.0000 g | Freq: Once | INTRAVENOUS | Status: AC
  Administered 2014-10-02: 1 g via INTRAVENOUS
  Filled 2014-10-02: qty 10

## 2014-10-02 MED ORDER — ACETAMINOPHEN 325 MG PO TABS: 650.0000 mg | ORAL_TABLET | ORAL | Status: DC | PRN

## 2014-10-02 MED ORDER — APIXABAN 5 MG PO TABS
5.0000 mg | ORAL_TABLET | Freq: Two times a day (BID) | ORAL | Status: DC
  Administered 2014-10-02 – 2014-10-11 (×18): 5 mg via ORAL
  Filled 2014-10-02 (×19): qty 1

## 2014-10-02 MED ORDER — SODIUM CHLORIDE 0.9 % IV SOLN: 250.0000 mL | INTRAVENOUS | Status: DC | PRN

## 2014-10-02 MED ORDER — FUROSEMIDE 10 MG/ML IJ SOLN
60.0000 mg | Freq: Once | INTRAMUSCULAR | Status: AC
  Administered 2014-10-02: 60 mg via INTRAVENOUS
  Filled 2014-10-02: qty 6

## 2014-10-02 MED ORDER — LINAGLIPTIN 5 MG PO TABS
5.0000 mg | ORAL_TABLET | Freq: Every day | ORAL | Status: DC
Start: 2014-10-02 — End: 2014-10-11
  Administered 2014-10-02 – 2014-10-11 (×10): 5 mg via ORAL
  Filled 2014-10-02 (×10): qty 1

## 2014-10-02 MED ORDER — LACTULOSE 10 GM/15ML PO SOLN
20.0000 g | Freq: Three times a day (TID) | ORAL | Status: DC
  Administered 2014-10-03 – 2014-10-11 (×25): 20 g via ORAL
  Filled 2014-10-02 (×31): qty 30

## 2014-10-02 MED ORDER — FUROSEMIDE 10 MG/ML IJ SOLN
60.0000 mg | Freq: Two times a day (BID) | INTRAMUSCULAR | Status: DC
  Administered 2014-10-02 – 2014-10-04 (×4): 60 mg via INTRAVENOUS
  Filled 2014-10-02 (×6): qty 6

## 2014-10-02 MED ORDER — SODIUM CHLORIDE 0.9 % IJ SOLN: 3.0000 mL | INTRAMUSCULAR | Status: DC | PRN

## 2014-10-02 MED ORDER — SODIUM CHLORIDE 0.9 % IJ SOLN
3.0000 mL | Freq: Two times a day (BID) | INTRAMUSCULAR | Status: DC
  Administered 2014-10-02 – 2014-10-11 (×17): 3 mL via INTRAVENOUS

## 2014-10-02 MED ORDER — INSULIN ASPART 100 UNIT/ML ~~LOC~~ SOLN
8.0000 [IU] | Freq: Once | SUBCUTANEOUS | Status: AC
  Administered 2014-10-02: 8 [IU] via INTRAVENOUS
  Filled 2014-10-02: qty 1

## 2014-10-02 MED ORDER — ONDANSETRON HCL 4 MG/2ML IJ SOLN: 4.0000 mg | Freq: Four times a day (QID) | INTRAMUSCULAR | Status: DC | PRN

## 2014-10-02 MED ORDER — CALCIUM CHLORIDE 10 % IV SOLN
1.0000 g | Freq: Once | INTRAVENOUS | Status: DC
  Filled 2014-10-02: qty 10

## 2014-10-02 MED ORDER — CARVEDILOL 25 MG PO TABS
25.0000 mg | ORAL_TABLET | Freq: Two times a day (BID) | ORAL | Status: DC
  Administered 2014-10-02 – 2014-10-05 (×6): 25 mg via ORAL
  Filled 2014-10-02 (×8): qty 1

## 2014-10-02 MED ORDER — LEVOTHYROXINE SODIUM 125 MCG PO TABS
187.5000 ug | ORAL_TABLET | Freq: Every day | ORAL | Status: DC
  Administered 2014-10-03 – 2014-10-11 (×9): 187.5 ug via ORAL
  Filled 2014-10-02 (×10): qty 1.5

## 2014-10-02 MED ORDER — DEXTROSE 50 % IV SOLN
25.0000 g | Freq: Once | INTRAVENOUS | Status: AC
  Administered 2014-10-02: 25 g via INTRAVENOUS
  Filled 2014-10-02: qty 50

## 2014-10-02 NOTE — Care Management Note (Addendum)
  Page 1 of 1   10/11/2014     11:30:34 AM CARE MANAGEMENT NOTE 10/11/2014  Patient:  [name], [name]   Account Number:  [id number]  Date Initiated:  10/02/2014  Documentation initiated by:  Donato Schultz  Subjective/Objective Assessment:   AKI     Action/Plan:   CM to follow for disposition needs   Anticipated DC Date:  10/05/2014   Anticipated DC Plan:  HOME/SELF CARE         Choice offered to / List presented to:             Status of service:  Completed, signed off Medicare Important Message given?  YES (If response is "NO", the following Medicare IM given date fields will be blank) Date Medicare IM given:  10/07/2014 Medicare IM given by:  Isabella Roemmich Date Additional Medicare IM given:  10/11/2014 Additional Medicare IM given by:  Kinzley Savell  Discharge Disposition:  HOME/SELF CARE  Per UR Regulation:  Reviewed for med. necessity/level of care/duration of stay  If discussed at Long Length of Stay Meetings, dates discussed:   10/08/2014  10/10/2014    Comments:  Donato Schultz RN, BSN, MSHL, CCM  Nurse - Case Manager,  (Unit [facility])  [phone]  10/02/2014 Hx/o 2 admissions over the past 6 months 10/02/14 AKI 11/28 - 12/4   acute on chronic systolic HF, AKI on CKD, cirrhosis.  OP f/u by Dr. Madilyn Fireman today who suggested he come to the ER.  He was d/c'd on lasix 40 mg but that was increased to 60 mg as an outpatient. Hx/o  Patient has not missed any doses and his diet has not changed.   Hx/o taken off his amio as OP d/t  his cirrhosis worsened and he was to see a liver specialist Social:  From home with wife Dispo Plan:  Home / Self care. CM will continue to monitor

## 2014-10-02 NOTE — ED Notes (Signed)
Notified Dr Denton Lank of critical potassium 6.4 and troponin 0.2.

## 2014-10-02 NOTE — ED Provider Notes (Signed)
CSN: [id number]     Arrival date & time 10/02/14  0932 History   First MD Initiated Contact with Patient 10/02/14 [phone]     Chief Complaint  Patient presents with  . Shortness of Breath  . Edema     (Consider location/radiation/quality/duration/timing/severity/associated sxs/prior Treatment) Patient is a 58 y.o. male presenting with shortness of breath. The history is provided by the patient.  Shortness of Breath Associated symptoms: no abdominal pain, no chest pain, no cough, no fever, no headaches, no neck pain, no rash, no sore throat and no vomiting   pt w hx chf, iddm, c/o progressive sob in the past week. Gradual onset, steadily worse. Dyspnea at rest, worse w exertion. Constant. No chest pain. +increased bilateral lower extremity edema. No pnd. Denies cough or uri c/o. No fever or chills. States compliant w normal meds, no recent change in meds or doses.  Is urinating less than normal, +urgency/freq, and then will urinate small amounts and feel as if not emptying bladder. No dysuria. No back or flank pain.      Past Medical History  Diagnosis Date  . DM   . HYPERLIPIDEMIA-MIXED   . Obesity, unspecified   . HYPERTENSION, UNSPECIFIED   . CAD (coronary artery disease)     a. Nonobstructive moderate CAD by cath - last 2012.  [name] Hypertrophic obstructive cardiomyopathy   . Complete heart block     a. s/p pacemaker later upgraded to CRT-D.  [name] CKD (chronic kidney disease)     a. Baseline Cr 1.4-1.5.  [name] Chronic systolic CHF (congestive heart failure)     a. NICM (EF 25% in 2012, 20-25% in 09/2013). b. s/p BiV-ICD (MDT).  . Colon polyps   . Ventricular tachycardia     a. EPS/RFCA of VT in 2012. b. On amio, mexilitene. c. Last BiV-ICD gen change 05/2013 (MDT).  . Hypothyroidism   . Fatty liver disease, nonalcoholic   . BPH (benign prostatic hyperplasia)   . Habitual alcohol use   . Liver cirrhosis    Past Surgical History  Procedure Laterality Date  . Pacemaker insertion     medtronic  . Cardiac catheterization    . Facial reconstruction surgery      after trauma in 2006  . Bi-ventricular implantable cardioverter defibrillator N/A 05/25/2013    Procedure: BI-VENTRICULAR IMPLANTABLE CARDIOVERTER DEFIBRILLATOR  (CRT-D);  Surgeon: Marinus Maw, MD;  Location: [facility] CATH LAB;  Service: Cardiovascular;  Laterality: N/A;   Family History  Problem Relation Age of Onset  . Diabetic kidney disease Father   . Coronary artery disease Neg Hx    History  Substance Use Topics  . Smoking status: Former Smoker    Types: Cigarettes    Quit date: 10/04/1968  . Smokeless tobacco: Never Used     Comment: 30+ yrs  . Alcohol Use: Yes     Comment: about 1 cup of liquor daily (straight liquor - not mixed)    Review of Systems  Constitutional: Negative for fever.  HENT: Negative for sore throat.   Eyes: Negative for redness.  Respiratory: Positive for shortness of breath. Negative for cough.   Cardiovascular: Positive for leg swelling. Negative for chest pain.  Gastrointestinal: Negative for vomiting, abdominal pain and diarrhea.  Endocrine: Negative for polyuria.  Genitourinary: Negative for flank pain.  Musculoskeletal: Negative for back pain and neck pain.  Skin: Negative for rash.  Neurological: Negative for headaches.  Hematological: Does not bruise/bleed easily.  Psychiatric/Behavioral: Negative for confusion.  Allergies  Ace inhibitors; Nitroglycerin; Procan sr; and Quinidine  Home Medications   Prior to Admission medications   Medication Sig Start Date End Date Taking? Authorizing Provider  apixaban (ELIQUIS) 5 MG TABS tablet Take 1 tablet (5 mg total) by mouth 2 (two) times daily. 09/23/14   Marinus Maw, MD  carvedilol (COREG) 25 MG tablet Take 1 tablet (25 mg total) by mouth 2 (two) times daily with a meal.    Rollene Rotunda, MD  cholecalciferol (VITAMIN D) 1000 UNITS tablet Take 2,000 Units by mouth daily.     Historical Provider, MD  fish  oil-omega-3 fatty acids 1000 MG capsule Take 3 capsules by mouth daily.     Historical Provider, MD  furosemide (LASIX) 40 MG tablet Take 40 mg by mouth daily.  02/28/14   Ernestina Penna, MD  insulin glargine (LANTUS) 100 UNIT/ML injection Inject 50 Units into the skin 2 (two) times daily.  07/25/14   Tammy Eckard, PHARMD  insulin lispro (HUMALOG KWIKPEN) 100 UNIT/ML KiwkPen Inject 16 units with largest/evening meal.Dispense quikpens Patient taking differently: Inject 16 units with largest/evening meal.Dispense quikpens 02/28/14   Ernestina Penna, MD  lactulose (CHRONULAC) 10 GM/15ML solution Take 30 mLs (20 g total) by mouth 3 (three) times daily. To maintain 2-3 loose bowel movements per day. 09/06/14   Maryann Mikhail, DO  levothyroxine (SYNTHROID, LEVOTHROID) 150 MCG tablet Take 150 mcg by mouth daily before breakfast.    Historical Provider, MD  levothyroxine (SYNTHROID, LEVOTHROID) 75 MCG tablet TAKE 1/2 TABLET (=37.5MCG) ALONG WITH LEVOTHYROXINE TO EQUAL 187. DAILY. Patient taking differently: TAKE 1/2 TABLET (=37.5MCG) BY MOUTH ALONG WITH LEVOTHYROXINE TO EQUAL 187. DAILY. 08/05/14   Ernestina Penna, MD  lisinopril (PRINIVIL,ZESTRIL) 2.5 MG tablet TAKE 1 TABLET TWICE A DAY Patient taking differently: TAKE 1 TABLET BY MOUTH TWICE A DAY 08/21/14   Ernestina Penna, MD  mexiletine (MEXITIL) 150 MG capsule TAKE 1 CAPSULE EVERY 8 HOURS Patient taking differently: TAKE 1 CAPSULE BY MOUTH EVERY 8 HOURS 02/28/14   Ernestina Penna, MD  rosuvastatin (CRESTOR) 20 MG tablet Take 0.5 tablets (10 mg total) by mouth daily. 03/04/14   Ernestina Penna, MD  sitaGLIPtin (JANUVIA) 50 MG tablet Take 1 tablet (50 mg total) by mouth daily. 09/03/13   Tammy Eckard, PHARMD  WELCHOL 625 MG tablet TAKE 1 TABLET THREE TIMES A DAY Patient taking differently: TAKE 1 TABLET BY MOUTH THREE TIMES A DAY 02/28/14   Ernestina Penna, MD   Temp(Src) 97.8 F (36.6 C) (Oral)  Ht 6\' 1"  (1.854 m)  Wt 325 lb (147.419 kg)   BMI 42.89 kg/m2 Physical Exam  Constitutional: He is oriented to person, place, and time. He appears well-developed and well-nourished.  Dyspneic, edematous.  HENT:  Mouth/Throat: Oropharynx is clear and moist.  Eyes: Conjunctivae are normal. No scleral icterus.  Neck: Neck supple. No tracheal deviation present.  Cardiovascular: Normal rate, regular rhythm, normal heart sounds and intact distal pulses.  Exam reveals no gallop and no friction rub.   No murmur heard. Pulmonary/Chest: Effort normal and breath sounds normal. No accessory muscle usage. No respiratory distress.  Abdominal: Soft. Bowel sounds are normal. There is no tenderness.  Obese. Mildly distended.   Genitourinary:  No cva tenderness.   Musculoskeletal: Normal range of motion. He exhibits edema.  Edema bil legs to prox thighs.   Neurological: He is alert and oriented to person, place, and time.  Skin: Skin is warm and dry.  Psychiatric: He has a normal mood and affect.  Nursing note and vitals reviewed.   ED Course  Procedures (including critical care time) Labs Review  Results for orders placed or performed during the hospital encounter of 10/02/14  CBC  Result Value Ref Range   WBC 6.0 4.0 - 10.5 K/uL   RBC 3.41 (L) 4.22 - 5.81 MIL/uL   Hemoglobin 9.0 (L) 13.0 - 17.0 g/dL   HCT 30.829.2 (L) 65.739.0 - 84.652.0 %   MCV 85.6 78.0 - 100.0 fL   MCH 26.4 26.0 - 34.0 pg   MCHC 30.8 30.0 - 36.0 g/dL   RDW 96.215.7 (H) 95.211.5 - 84.115.5 %   Platelets 75 (L) 150 - 400 K/uL  Comprehensive metabolic panel  Result Value Ref Range   Sodium 140 135 - 145 mmol/L   Potassium 6.4 (HH) 3.5 - 5.1 mmol/L   Chloride 114 (H) 96 - 112 mEq/L   CO2 20 19 - 32 mmol/L   Glucose, Bld 133 (H) 70 - 99 mg/dL   BUN 63 (H) 6 - 23 mg/dL   Creatinine, Ser 3.243.15 (H) 0.50 - 1.35 mg/dL   Calcium 8.9 8.4 - 40.110.5 mg/dL   Total Protein 6.2 6.0 - 8.3 g/dL   Albumin 2.9 (L) 3.5 - 5.2 g/dL   AST 77 (H) 0 - 37 U/L   ALT 38 0 - 53 U/L   Alkaline Phosphatase 86 39 -  117 U/L   Total Bilirubin 0.4 0.3 - 1.2 mg/dL   GFR calc non Af Amer 20 (L) >90 mL/min   GFR calc Af Amer 23 (L) >90 mL/min   Anion gap 6 5 - 15  Brain natriuretic peptide (only with dyspnea)  Result Value Ref Range   B Natriuretic Peptide 736.5 (H) 0.0 - 100.0 pg/mL  Troponin I  Result Value Ref Range   Troponin I 0.20 (H) <0.031 ng/mL  [facility]-stat troponin, ED [facility])  Result Value Ref Range   Troponin i, poc 0.05 0.00 - 0.08 ng/mL   Comment 3           Dg Chest 2 View  10/02/2014   CLINICAL DATA:  Increase shortness of breath for 5 days. Increasing abdominal ascites and lower extremity edema. Cough, congestion for 5 days.  EXAM: CHEST  2 VIEW  COMPARISON:  08/31/2014  FINDINGS: Cardiomegaly with vascular congestion. Left pacer is unchanged. No overt edema. Bibasilar opacities are stable in likely reflect atelectasis or scarring. No effusions or acute bony abnormality.  IMPRESSION: Mild cardiomegaly and vascular congestion. Bibasilar atelectasis or scarring. No change.   Electronically Signed   By: Charlett NoseKevin  Dover M.D.   On: 10/02/2014 11:09       EKG Interpretation   Date/Time:  Wednesday October 02 2014 09:46:39 EST Ventricular Rate:  120 PR Interval:    QRS Duration: 231 QT Interval:  529 QTC Calculation: 748 R Axis:   -115 Text Interpretation:  AV dual-paced rhythm Nonspecific IVCD with LAD  Non-specific ST-t changes No significant change since last tracing  Confirmed by Kandis Henry  MD, Caryn BeeKEVIN ([id number]) on 10/02/2014 9:51:01 AM      MDM   Iv ns. Labs. Continuous pulse ox and monitor. Ecg. Cxr.  Reviewed nursing notes and prior charts for additional history.   cxr with vascular congestion/chf.   Lasix 60 mg iv.  Labs return, K high 6.4, no hemolysis.   Stat Ca+ iv. d50 iv, hco3 iv, insulin iv. Kayexalate po.   Recheck pt, dyspnea mildly  improved from prior.   Pt also w worsening renal fxn on labs, aki on cri.  CRITICAL CARE  RE: CHF, hyperkalemia, Acute Kidney Injury,  dyspnea Performed by: Suzi Roots Total critical care time: 35 Critical care time was exclusive of separately billable procedures and treating other patients. Critical care was necessary to treat or prevent imminent or life-threatening deterioration. Critical care was time spent personally by me on the following activities: development of treatment plan with patient and/or surrogate as well as nursing, discussions with consultants, evaluation of patient's response to treatment, examination of patient, obtaining history from patient or surrogate, ordering and performing treatments and interventions, ordering and review of laboratory studies, ordering and review of radiographic studies, pulse oximetry and re-evaluation of patient's condition.  Bladder scan shows 400 cc urine in bladder/pvr.  Given recent urinary symptoms, and feeling unable to empty bladder, worsening renal fxn, will place foley, check pvr, and get renal u/s.   hospitalist called to admit.     Suzi Roots, MD 10/02/14 [phone]

## 2014-10-02 NOTE — Progress Notes (Signed)
Bilateral lower extremity venous duplex completed:  No evidence of DVT or superficial thrombosis.  There appears to be a Baker's cyst in the right popliteal fossa.   

## 2014-10-02 NOTE — H&P (Signed)
[facility] History and Physical  Vishal Sandlin WUJ:[id number] DOB: Nov 28, 1955 DOA: 10/02/2014  Referring physician: er PCP: [name], MD   Chief Complaint: sob  HPI: Dakota CRYDER Sr. is a 58 y.o. male  Who was recently in the hospital and discharged on 12/4 for acute on chronic systolic HF, AKI on CKD, cirrhosis.  He was seen by Dr. Madilyn Fireman today who suggested he come to the ER.  He was d/c'd on lasix 40 mg but that was increased to 60 mg as an outpatient.  Patient has not missed any doses and his diet has not changed. Also outpatient he was taken off his amio as his cirrhosis has worsened and he was to see a liver specialist  He comes in with increased SOB that has been worsening for the last 5 days.  He has noticed LE swelling and increased abdominal swelling.  Unable to lay still.  Has noticed he has had a harder time urinating and his urine has decreased and he feels as if his bladder is not emptying.   In the ER, his Cr was found to be elevated.  K was 6.4.  DVT studies were negative in the ER- Baker's cyst in right popliteal fossa.    Review of Systems:  All systems reviewed, negative unless stated above   Past Medical History  Diagnosis Date  . DM   . HYPERLIPIDEMIA-MIXED   . Obesity, unspecified   . HYPERTENSION, UNSPECIFIED   . CAD (coronary artery disease)     a. Nonobstructive moderate CAD by cath - last 2012.  [name] Hypertrophic obstructive cardiomyopathy   . Complete heart block     a. s/p pacemaker later upgraded to CRT-D.  [name] CKD (chronic kidney disease)     a. Baseline Cr 1.4-1.5.  [name] Chronic systolic CHF (congestive heart failure)     a. NICM (EF 25% in 2012, 20-25% in 09/2013). b. s/p BiV-ICD (MDT).  . Colon polyps   . Ventricular tachycardia     a. EPS/RFCA of VT in 2012. b. On amio, mexilitene. c. Last BiV-ICD gen change 05/2013 (MDT).  . Hypothyroidism   . Fatty liver disease, nonalcoholic   . BPH (benign prostatic hyperplasia)   . Habitual  alcohol use   . Liver cirrhosis    Past Surgical History  Procedure Laterality Date  . Pacemaker insertion      medtronic  . Cardiac catheterization    . Facial reconstruction surgery      after trauma in 2006  . Bi-ventricular implantable cardioverter defibrillator N/A 05/25/2013    Procedure: BI-VENTRICULAR IMPLANTABLE CARDIOVERTER DEFIBRILLATOR  (CRT-D);  Surgeon: Marinus Maw, MD;  Location: [facility] CATH LAB;  Service: Cardiovascular;  Laterality: N/A;   Social History:  reports that he quit smoking about 46 years ago. His smoking use included Cigarettes. He smoked 0.00 packs per day. He has never used smokeless tobacco. He reports that he drinks alcohol. He reports that he does not use illicit drugs.  Allergies  Allergen Reactions  . Ace Inhibitors     AVOID ALL  BECAUSE OF HEART DISEASE   . Nitroglycerin Other (See Comments)    Reaction unknown  . Procan Sr [Procainamide Hcl]     PT. HAS HEART DISEASE CALLED HYPERTHROPHIC CARDIOMYOPATHY  . Quinidine     PT. HAS HEART DISEASE CALLED HYPERTHROPHIC CARDIOMYOPATHY      Family History  Problem Relation Age of Onset  . Diabetic kidney disease Father   . Coronary artery  disease Neg Hx      Prior to Admission medications   Medication Sig Start Date End Date Taking? Authorizing Provider  apixaban (ELIQUIS) 5 MG TABS tablet Take 1 tablet (5 mg total) by mouth 2 (two) times daily. 09/23/14  Yes Marinus MawGregg W Taylor, MD  carvedilol (COREG) 25 MG tablet Take 1 tablet (25 mg total) by mouth 2 (two) times daily with a meal.   Yes Rollene RotundaJames Hochrein, MD  cholecalciferol (VITAMIN D) 1000 UNITS tablet Take 2,000 Units by mouth daily.    Yes Historical Provider, MD  fish oil-omega-3 fatty acids 1000 MG capsule Take 1 capsule by mouth 3 (three) times daily.    Yes Historical Provider, MD  furosemide (LASIX) 40 MG tablet Take 60 mg by mouth daily.  02/28/14  Yes Ernestina Pennaonald W Moore, MD  insulin glargine (LANTUS) 100 UNIT/ML injection Inject 50 Units into the  skin 2 (two) times daily.  07/25/14  Yes Tammy Eckard, PHARMD  insulin lispro (HUMALOG KWIKPEN) 100 UNIT/ML KiwkPen Inject 16 units with largest/evening meal.Dispense quikpens Patient taking differently: Inject 16 units with largest/evening meal.Dispense quikpens 02/28/14  Yes Ernestina Pennaonald W Moore, MD  lactulose (CHRONULAC) 10 GM/15ML solution Take 30 mLs (20 g total) by mouth 3 (three) times daily. To maintain 2-3 loose bowel movements per day. 09/06/14  Yes Maryann Mikhail, DO  levothyroxine (SYNTHROID, LEVOTHROID) 150 MCG tablet Take 150 mcg by mouth daily before breakfast.   Yes Historical Provider, MD  levothyroxine (SYNTHROID, LEVOTHROID) 75 MCG tablet TAKE 1/2 TABLET (=37.5MCG) ALONG WITH LEVOTHYROXINE 150MCG TO EQUAL 187.5MCG DAILY. Patient taking differently: TAKE 1/2 TABLET (=37.5MCG) BY MOUTH ALONG WITH LEVOTHYROXINE 150MCG TO EQUAL 187.5MCG DAILY. 08/05/14  Yes Ernestina Pennaonald W Moore, MD  lisinopril (PRINIVIL,ZESTRIL) 2.5 MG tablet TAKE 1 TABLET TWICE A DAY Patient taking differently: TAKE 1 TABLET BY MOUTH TWICE A DAY 08/21/14  Yes Ernestina Pennaonald W Moore, MD  mexiletine (MEXITIL) 150 MG capsule TAKE 1 CAPSULE EVERY 8 HOURS Patient taking differently: TAKE 1 CAPSULE BY MOUTH EVERY 8 HOURS 02/28/14  Yes Ernestina Pennaonald W Moore, MD  rosuvastatin (CRESTOR) 20 MG tablet Take 0.5 tablets (10 mg total) by mouth daily. 03/04/14  Yes Ernestina Pennaonald W Moore, MD  sitaGLIPtin (JANUVIA) 50 MG tablet Take 1 tablet (50 mg total) by mouth daily. 09/03/13  Yes Tammy Eckard, PHARMD  spironolactone (ALDACTONE) 100 MG tablet Take 50 mg by mouth daily. 09/25/14  Yes Historical Provider, MD  WELCHOL 625 MG tablet TAKE 1 TABLET THREE TIMES A DAY Patient taking differently: TAKE 1 TABLET BY MOUTH THREE TIMES A DAY 02/28/14  Yes Ernestina Pennaonald W Moore, MD   Physical Exam: Filed Vitals:   10/02/14 0947 10/02/14 1000  BP:  110/84  Pulse:  63  Temp: 97.8 F (36.6 C)   TempSrc: Oral   Resp:  22  Height: 6\' 1"  (1.854 m)   Weight: 147.419 kg (325 lb)   SpO2:   100%    Wt Readings from Last 3 Encounters:  10/02/14 147.419 kg (325 lb)  09/11/14 147.873 kg (326 lb)  09/06/14 142.9 kg (315 lb 0.6 oz)    General:  NAD, morbidly obese Eyes: PERRL, normal lids, irises & conjunctiva ENT: grossly normal hearing, lips & tongue Neck: no LAD, masses or thyromegaly Cardiovascular: RRR, no m/r/g. +LE edema to mid thigh Respiratory: crackles bilaterally, no w/r/r. Normal respiratory effort. Abdomen: soft, ntnd, obese Skin: no rash or induration seen on limited exam Musculoskeletal: grossly normal tone BUE/BLE Psychiatric: grossly normal mood and affect, speech fluent and  appropriate Neurologic: grossly non-focal.          Labs on Admission:  Basic Metabolic Panel:  Recent Labs Lab 10/02/14 0957  NA 140  K 6.4*  CL 114*  CO2 20  GLUCOSE 133*  BUN 63*  CREATININE 3.15*  CALCIUM 8.9   Liver Function Tests:  Recent Labs Lab 10/02/14 0957  AST 77*  ALT 38  ALKPHOS 86  BILITOT 0.4  PROT 6.2  ALBUMIN 2.9*   No results for input(s): LIPASE, AMYLASE in the last 168 hours. No results for input(s): AMMONIA in the last 168 hours. CBC:  Recent Labs Lab 10/02/14 0957  WBC 6.0  HGB 9.0*  HCT 29.2*  MCV 85.6  PLT 75*   Cardiac Enzymes:  Recent Labs Lab 10/02/14 0957  TROPONINI 0.20*    BNP (last 3 results)  Recent Labs  08/31/14 1350 09/03/14 0255  PROBNP 1597.0* 1552.0*   CBG: No results for input(s): GLUCAP in the last 168 hours.  Radiological Exams on Admission: Dg Chest 2 View  10/02/2014   CLINICAL DATA:  Increase shortness of breath for 5 days. Increasing abdominal ascites and lower extremity edema. Cough, congestion for 5 days.  EXAM: CHEST  2 VIEW  COMPARISON:  08/31/2014  FINDINGS: Cardiomegaly with vascular congestion. Left pacer is unchanged. No overt edema. Bibasilar opacities are stable in likely reflect atelectasis or scarring. No effusions or acute bony abnormality.  IMPRESSION: Mild cardiomegaly and  vascular congestion. Bibasilar atelectasis or scarring. No change.   Electronically Signed   By: Charlett Nose M.D.   On: 10/02/2014 11:09    EKG: Independently reviewed. Paced rhythm  Assessment/Plan Active Problems:   Type 2 diabetes mellitus treated with insulin   Acute on chronic systolic CHF (congestive heart failure)   Acute renal failure   Hyperkalemia   AKI (acute kidney injury)   SOB (shortness of breath)  Acute CHF-systolic:  x ray shows vascular congestion: IV lasix, daily weights, strict i/os Last echo 11/15- EF 25 % with diffuse hypokinesis  LV clot- on eliquis- many need change to coumadin if Cr remains elevated  AKI on CKD- baseline 1.6- monitor with diuresis  hyperkalemia- kayexelate, has been eating a lot of oranges  DM type 2- continue home meds  SOB and LE edema- ? From heart failure  Hypothyroid- continue synthroid     Code Status: full DVT Prophylaxis: Family Communication: patient Disposition Plan:   Time spent: 65 min  Marlin Canary [facility] Pager [phone]

## 2014-10-02 NOTE — ED Notes (Signed)
Admitting Physician at the bedside.  

## 2014-10-02 NOTE — Progress Notes (Signed)
UR completed Jaeshaun Riva K. Merrel Crabbe, RN, BSN, MSHL, CCM  10/02/2014 5:22 PM

## 2014-10-02 NOTE — ED Notes (Signed)
Patient transported to X-ray 

## 2014-10-02 NOTE — Progress Notes (Addendum)
Pt arrived to room via stretcher from ED.  Oriented to room.  Call bell at reach.  Instructed to call for assistance.  Wife at bedside.  Verbalized understanding.  Will continue to monitor.  Dr. Benjamine Mola notified of pt's location via page, as ordered Amanda Pea, RN.

## 2014-10-02 NOTE — ED Notes (Addendum)
Pt from home with c/o increased shortness of breath x 5 days with increased abdominal ascites and bilateral LE edema.  Pt reports being dx recently with cirrhosis, hx of MI with pacemaker/defib.  Pt in NAD, A&O.

## 2014-10-03 DIAGNOSIS — I5043 Acute on chronic combined systolic (congestive) and diastolic (congestive) heart failure: Secondary | ICD-10-CM | POA: Diagnosis present

## 2014-10-03 DIAGNOSIS — R0602 Shortness of breath: Secondary | ICD-10-CM

## 2014-10-03 LAB — TROPONIN I
TROPONIN I: 0.22 ng/mL — AB (ref <0.031)
TROPONIN I: 0.23 ng/mL — AB (ref <0.031)

## 2014-10-03 LAB — CBC WITH DIFFERENTIAL/PLATELET
BASOS ABS: 0 10*3/uL (ref 0.0–0.1)
BASOS PCT: 1 % (ref 0–1)
EOS PCT: 2 % (ref 0–5)
Eosinophils Absolute: 0.1 10*3/uL (ref 0.0–0.7)
HCT: 29.5 % — ABNORMAL LOW (ref 39.0–52.0)
Hemoglobin: 8.9 g/dL — ABNORMAL LOW (ref 13.0–17.0)
LYMPHS PCT: 17 % (ref 12–46)
Lymphs Abs: 1 10*3/uL (ref 0.7–4.0)
MCH: 25.6 pg — ABNORMAL LOW (ref 26.0–34.0)
MCHC: 30.2 g/dL (ref 30.0–36.0)
MCV: 84.8 fL (ref 78.0–100.0)
MONOS PCT: 14 % — AB (ref 3–12)
Monocytes Absolute: 0.9 10*3/uL (ref 0.1–1.0)
NEUTROS PCT: 67 % (ref 43–77)
Neutro Abs: 4.2 10*3/uL (ref 1.7–7.7)
Platelets: 70 10*3/uL — ABNORMAL LOW (ref 150–400)
RBC: 3.48 MIL/uL — AB (ref 4.22–5.81)
RDW: 15.8 % — ABNORMAL HIGH (ref 11.5–15.5)
WBC: 6.2 10*3/uL (ref 4.0–10.5)

## 2014-10-03 LAB — POTASSIUM
Potassium: 6.2 mmol/L (ref 3.5–5.1)
Potassium: 6.2 mmol/L (ref 3.5–5.1)

## 2014-10-03 LAB — GLUCOSE, CAPILLARY
GLUCOSE-CAPILLARY: 132 mg/dL — AB (ref 70–99)
GLUCOSE-CAPILLARY: 153 mg/dL — AB (ref 70–99)
GLUCOSE-CAPILLARY: 171 mg/dL — AB (ref 70–99)
Glucose-Capillary: 150 mg/dL — ABNORMAL HIGH (ref 70–99)

## 2014-10-03 LAB — BASIC METABOLIC PANEL
ANION GAP: 8 (ref 5–15)
BUN: 64 mg/dL — ABNORMAL HIGH (ref 6–23)
CO2: 19 mmol/L (ref 19–32)
Calcium: 9.2 mg/dL (ref 8.4–10.5)
Chloride: 114 mEq/L — ABNORMAL HIGH (ref 96–112)
Creatinine, Ser: 2.82 mg/dL — ABNORMAL HIGH (ref 0.50–1.35)
GFR calc Af Amer: 27 mL/min — ABNORMAL LOW (ref >90)
GFR calc non Af Amer: 23 mL/min — ABNORMAL LOW (ref >90)
Glucose, Bld: 156 mg/dL — ABNORMAL HIGH (ref 70–99)
Potassium: 6.7 mmol/L (ref 3.5–5.1)
Sodium: 141 mmol/L (ref 135–145)

## 2014-10-03 MED ORDER — SODIUM POLYSTYRENE SULFONATE 15 GM/60ML PO SUSP
30.0000 g | Freq: Once | ORAL | Status: AC
  Administered 2014-10-03: 30 g via ORAL
  Filled 2014-10-03: qty 120

## 2014-10-03 MED ORDER — DEXTROSE 50 % IV SOLN
INTRAVENOUS | Status: AC
  Filled 2014-10-03: qty 50

## 2014-10-03 MED ORDER — DEXTROSE 50 % IV SOLN
25.0000 g | Freq: Once | INTRAVENOUS | Status: AC
  Administered 2014-10-03: 25 g via INTRAVENOUS
  Filled 2014-10-03: qty 50

## 2014-10-03 MED ORDER — INSULIN ASPART 100 UNIT/ML ~~LOC~~ SOLN
5.0000 [IU] | Freq: Once | SUBCUTANEOUS | Status: AC
  Administered 2014-10-03: 5 [IU] via INTRAVENOUS

## 2014-10-03 MED ORDER — SODIUM POLYSTYRENE SULFONATE 15 GM/60ML PO SUSP
30.0000 g | Freq: Once | ORAL | Status: AC
  Administered 2014-10-03: 30 g via RECTAL
  Filled 2014-10-03: qty 120

## 2014-10-03 MED ORDER — SODIUM POLYSTYRENE SULFONATE 15 GM/60ML PO SUSP
15.0000 g | Freq: Once | ORAL | Status: AC
  Administered 2014-10-03: 15 g via ORAL
  Filled 2014-10-03 (×3): qty 60

## 2014-10-03 MED ORDER — TAMSULOSIN HCL 0.4 MG PO CAPS
0.4000 mg | ORAL_CAPSULE | Freq: Every day | ORAL | Status: DC
  Administered 2014-10-03 – 2014-10-11 (×9): 0.4 mg via ORAL
  Filled 2014-10-03 (×9): qty 1

## 2014-10-03 NOTE — Consult Note (Signed)
Admit date: 10/02/2014 Referring Physician  Dr. Thedore Mins Primary Physician  Dr. Izora Ribas Primary Cardiologist  Dr. Lewayne Bunting Reason for Consultation  CHF  HPI: [name] is a very pleasant middle-age man with a hypertrophic cardiomyopathy, chronic systolic heart failure, complete heart block, and ventricular tachycardia. He has been on amiodarone and mexiletine. His ventricular tachycardia has been fairly well-controlled with a one episode of ICD shock in the last 6 months. He has chronic systolic heart failure class III.  He was hospitalized the first week of December with hypoglycemia and thought to have an upper respiratory illness. He was found to have volume overload, worsening renal failure, and cirrhosis. Dr. [name] saw him 12/9 and noted that the patient's liver function tests have been elevated for several months. 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 His amio was stopped and he has been referred to GI.  He was seen by Dr. Madilyn Fireman on 12/30 and complained to him that he was having increased SOB for the past 5 days with increased LE edema and abdominal swelling.  He also noted decreased urine output.  In ER he was in acute on chronic renal failure with hyperkalemia.  Chest xray showed vascular congestion.  He was started on IV Lasix and Kayexalate.  Cardiology is now asked to consult.     PMH:   Past Medical History  Diagnosis Date  . HYPERLIPIDEMIA-MIXED   . Obesity, unspecified   . HYPERTENSION, UNSPECIFIED   . CAD (coronary artery disease)     a. Nonobstructive moderate CAD by cath - last 2012.  [name] Hypertrophic obstructive cardiomyopathy   . Complete heart block     a. s/p pacemaker later upgraded to CRT-D.  [name] CKD (chronic kidney disease)     a. Baseline Cr 1.4-1.5.  [name] Chronic systolic CHF (congestive heart failure)     a. NICM (EF 25% in 2012, 20-25% in 09/2013). b. s/p  BiV-ICD (MDT).  . Colon polyps   . Ventricular tachycardia     a. EPS/RFCA of VT in 2012. b. On amio, mexilitene. c. Last BiV-ICD gen change 05/2013 (MDT).  . Hypothyroidism   . Fatty liver disease, nonalcoholic   . BPH (benign prostatic hyperplasia)   . Habitual alcohol use   . Liver cirrhosis   . Myocardial infarction   . AICD (automatic cardioverter/defibrillator) present 05/2013  . DM     insulin dependent      PSH:   Past Surgical History  Procedure Laterality Date  . Pacemaker insertion      medtronic  . Cardiac catheterization    . Facial reconstruction surgery      after trauma in 2006  . Bi-ventricular implantable cardioverter defibrillator N/A 05/25/2013    Procedure: BI-VENTRICULAR IMPLANTABLE CARDIOVERTER DEFIBRILLATOR  (CRT-D);  Surgeon: Marinus Maw, MD;  Location: [facility] CATH LAB;  Service: Cardiovascular;  Laterality: N/A;  . Back surgery      Allergies:  Ace inhibitors; Nitroglycerin; Procan sr; and Quinidine Prior to Admit Meds:   Prescriptions prior to admission  Medication Sig Dispense Refill Last Dose  . apixaban (ELIQUIS) 5 MG TABS tablet Take 1 tablet (5 mg total) by mouth 2 (two) times daily. 60 tablet 6 10/02/2014 at Unknown time  . carvedilol (COREG) 25 MG tablet Take 1 tablet (25 mg total) by mouth 2 (two) times daily with a meal. 180 tablet 2 10/02/2014 at  530a  . cholecalciferol (VITAMIN D) 1000 UNITS tablet Take 2,000 Units by mouth daily.    10/01/2014 at Unknown time  . fish oil-omega-3 fatty acids 1000 MG capsule Take 1 capsule by mouth 3 (three) times daily.    10/02/2014 at Unknown time  . furosemide (LASIX) 40 MG tablet Take 60 mg by mouth daily.    10/02/2014 at Unknown time  . insulin glargine (LANTUS) 100 UNIT/ML injection Inject 50 Units into the skin 2 (two) times daily.  90 mL 3 10/02/2014 at Unknown time  . insulin lispro (HUMALOG KWIKPEN) 100 UNIT/ML KiwkPen Inject 16 units with largest/evening meal.Dispense quikpens (Patient taking  differently: Inject 16 units with largest/evening meal.Dispense quikpens) 30 mL 11 10/01/2014 at Unknown time  . lactulose (CHRONULAC) 10 GM/15ML solution Take 30 mLs (20 g total) by mouth 3 (three) times daily. To maintain 2-3 loose bowel movements per day. 240 mL 0 Past Month at Unknown time  . levothyroxine (SYNTHROID, LEVOTHROID) 150 MCG tablet Take 150 mcg by mouth daily before breakfast.   10/02/2014 at Unknown time  . levothyroxine (SYNTHROID, LEVOTHROID) 75 MCG tablet TAKE 1/2 TABLET (=37.5MCG) ALONG WITH LEVOTHYROXINE TO EQUAL 187. DAILY. (Patient taking differently: TAKE 1/2 TABLET (=37.5MCG) BY MOUTH ALONG WITH LEVOTHYROXINE TO EQUAL 187. DAILY.) 30 tablet 1 10/02/2014 at Unknown time  . lisinopril (PRINIVIL,ZESTRIL) 2.5 MG tablet TAKE 1 TABLET TWICE A DAY (Patient taking differently: TAKE 1 TABLET BY MOUTH TWICE A DAY) 180 tablet 1 10/02/2014 at Unknown time  . mexiletine (MEXITIL) 150 MG capsule TAKE 1 CAPSULE EVERY 8 HOURS (Patient taking differently: TAKE 1 CAPSULE BY MOUTH EVERY 8 HOURS) 270 capsule 3 10/02/2014 at Unknown time  . rosuvastatin (CRESTOR) 20 MG tablet Take 0.5 tablets (10 mg total) by mouth daily. 28 tablet 0 10/01/2014 at Unknown time  . sitaGLIPtin (JANUVIA) 50 MG tablet Take 1 tablet (50 mg total) by mouth daily. 30 tablet 2 10/01/2014 at Unknown time  . spironolactone (ALDACTONE) 100 MG tablet Take 50 mg by mouth daily.   10/02/2014 at Unknown time  . WELCHOL 625 MG tablet TAKE 1 TABLET THREE TIMES A DAY (Patient taking differently: TAKE 1 TABLET BY MOUTH THREE TIMES A DAY) 270 tablet 3 10/02/2014 at Unknown time   Fam HX:    Family History  Problem Relation Age of Onset  . Diabetic kidney disease Father   . Coronary artery disease Neg Hx    Social HX:    History   Social History  . Marital Status: Married    Spouse Name: N/A    Number of Children: N/A  . Years of Education: N/A   Occupational History  . Not on file.   Social  History Main Topics  . Smoking status: Former Smoker    Types: Cigarettes    Quit date: 10/04/1968  . Smokeless tobacco: Never Used     Comment: 30+ yrs  . Alcohol Use: Yes     Comment: about 1 cup of liquor daily (straight liquor - not mixed)  . Drug Use: No  . Sexual Activity: Not on file   Other Topics Concern  . Not on file   Social History Narrative     ROS:  All 11 ROS were addressed and are negative except what is stated in the HPI  Physical Exam: Blood pressure 100/70, pulse 78, temperature 98 F (36.7 C), temperature source Oral, resp. rate 18, height 6\' 1"  (1.854 m), weight 331 lb 4.8 oz (150.277 kg), SpO2  100 %.    General: Well developed, well nourished, in no acute distress Head: Eyes PERRLA, No xanthomas.   Normal cephalic and atramatic  Lungs:   Clear bilaterally to auscultation and percussion. Heart:   HRRR S1 S2 Pulses are 2+ & equal.            No carotid bruit. No JVD.  No abdominal bruits. No femoral bruits. Abdomen: Bowel sounds are positive, abdomen soft and non-tender without masses Extremities:   No clubbing, cyanosis or edema.  DP +1 Neuro: Alert and oriented X 3. Psych:  Good affect, responds appropriately    Labs:   Lab Results  Component Value Date   WBC 6.2 10/03/2014   HGB 8.9* 10/03/2014   HCT 29.5* 10/03/2014   MCV 84.8 10/03/2014   PLT 70* 10/03/2014    Recent Labs Lab 10/02/14 0957 10/03/14 0439  NA 140 141  K 6.4* 6.7*  CL 114* 114*  CO2 20 19  BUN 63* 64*  CREATININE 3.15* 2.82*  CALCIUM 8.9 9.2  PROT 6.2  --   BILITOT 0.4  --   ALKPHOS 86  --   ALT 38  --   AST 77*  --   GLUCOSE 133* 156*   No results found for: PTT Lab Results  Component Value Date   INR 1.41 09/06/2014   INR 1.49 09/05/2014   INR 1.70* 09/04/2014   Lab Results  Component Value Date   CKTOTAL 130 09/01/2014   CKMB 4.5* 09/29/2013   TROPONINI 0.20* 10/02/2014     Lab Results  Component Value Date   CHOL 127 07/04/2014   CHOL 163  02/28/2014   CHOL 139 10/29/2013   Lab Results  Component Value Date   HDL 24* 07/04/2014   HDL 29* 02/28/2014   HDL 38* 10/29/2013   Lab Results  Component Value Date   LDLCALC 73 07/04/2014   LDLCALC 100* 02/28/2014   LDLCALC 68 10/29/2013   Lab Results  Component Value Date   TRIG 150* 07/04/2014   TRIG 171* 02/28/2014   TRIG 165* 10/29/2013   No results found for: CHOLHDL No results found for: LDLDIRECT    Radiology:  Dg Chest 2 View  10/02/2014   CLINICAL DATA:  Increase shortness of breath for 5 days. Increasing abdominal ascites and lower extremity edema. Cough, congestion for 5 days.  EXAM: CHEST  2 VIEW  COMPARISON:  08/31/2014  FINDINGS: Cardiomegaly with vascular congestion. Left pacer is unchanged. No overt edema. Bibasilar opacities are stable in likely reflect atelectasis or scarring. No effusions or acute bony abnormality.  IMPRESSION: Mild cardiomegaly and vascular congestion. Bibasilar atelectasis or scarring. No change.   Electronically Signed   By: Charlett NoseKevin  Dover M.D.   On: 10/02/2014 11:09   [place] Renal  10/02/2014   CLINICAL DATA:  Urinary retention. Worsening renal function. Evaluate for obstruction.  EXAM: RENAL/URINARY TRACT ULTRASOUND COMPLETE  COMPARISON:  CT 09/04/2014.  FINDINGS: Right Kidney:  Length: 11.7 cm. Echogenicity within normal limits. No mass or hydronephrosis visualized.  Left Kidney:  Length: 10.2 cm. Echogenicity within normal limits. No mass or hydronephrosis visualized.  Bladder:  Collapse with Foley catheter.  IMPRESSION: Normal renal ultrasound.  Negative for obstruction or stones.   Electronically Signed   By: Andreas NewportGeoffrey  Lamke M.D.   On: 10/02/2014 13:59    EKG:  AV paced with widening of the QRS  ASSESSMENT:  1.  Acute on chronic systolic/diastolic CHF - ? As to what tipped him over  into CHF>  He was just seen by Dr. [name] 2 weeks ago and was doing ok.  He has noticed decreased urine output recently with increased abdominal girth to the  point it was making him SOB along with increased LE edema.  He is up 16lbs from his discharge weight (316lbs) on 12/4.  His lungs are clear on exam but he has markedly enlarged abdomen that is tense and increased LE edema.  He had mild ascites on last admission.  2.  Acute on chronic renal insufficiency stage III.  Baseline creatinine is 1.6.  Slightly improved with diuresis from yesterday.  He has noticed some decrease in UOP lately and has blood tinged urine (this could be from traumatic foley placement on full dose anticoagulation) so need to also consider acute obstructive uropathy in the setting of worsening renal function. 3.  Hyperkalemia secondary to #2.  He has received several doses of Kayexalate and this am received Insulin and glucose as well.  Will repeat potassium level.  His EKG showed signs of widening QRS due to increased potassium. 4.  Hypertrophic CM now with EF 25% with apical clot 5.  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  Hepatic cirrhosis secondary to #1 as well as possible recent amio use (amio stopped recently by Dr. [name] and in progress of GI w/u). 7.  Possible CAP - on antibiotics 8.  Ventricular tachycardia s/p AICD and on Mexilitene (amio stopped due to increased LFTs) 9.  Thrombocytopenia  PLAN:   1.  Continue IV diuretics as you are doing 2.  Renal consult to make sure there is no underlying obstructive uropathy as etiology of recent decline in UOP and acute renal failure (most likely due to CHF) with hematuria (most likely due to recent foley placement on full dose anticoagulation) 3.  May need to dose Eliquis for reduced renal function - will address with pharmacy 4.  Continue with Kayexalate as needed for hyperkalemia 5.  Abdominal US to evaluate for ascites  6.  Continue Coreg.  No ACE I secondary to acute renal failure.  Quintella Reichert, MD  10/03/2014  11:14 AM

## 2014-10-03 NOTE — Progress Notes (Signed)
Spoke to [employer] in lab and stated blood work for troponin was collected at 0930 but machine is currently down and the floor was notified. Will continue to monitor.  Amanda Pea, Charity fundraiser.

## 2014-10-03 NOTE — Progress Notes (Addendum)
Patient Demographics  [name], is a 58 y.o. male, DOB - 1956-04-29, ZOX:[id number]  Admit date - 10/02/2014   Admitting Physician Joseph Art, DO  Outpatient Primary MD for the patient is [name], MD  LOS - 1   Chief Complaint  Patient presents with  . Shortness of Breath  . Edema        Subjective:   [name] today has, No headache, No chest pain, No abdominal pain - No Nausea, No new weakness tingling or numbness, No Cough - SOB.   Assessment & Plan    1. Acute on chronic respiratory failure secondary to acute on chronic diastolic and systolic CHF. EF 25%, has AICD, and history of nonischemic cardiomyopathy.  Continue IV Lasix, on Coreg, not on ACE/ARB secondary to renal failure, cardiology consulted, continue salt and fluid restriction, daily weight, intake and output monitoring.    2.NSTEMI - follow troponin trend, likely due to #1 above, cardiology consulted, chest pain free. Continue beta blocker , Eliquis along with statin.    3. ARF with hyperkalemia on chronic kidney disease stage IV. Baseline creatinine around 2.5, hold nephrotoxins, this likely is due to CHF decompensation, improving with diuresis, hyperkalemia protocol being followed. We will monitor potassium closely. On telemetry.    4. DM type II. Continue Lantus and sliding scale  Lab Results  Component Value Date   HGBA1C 6.8* 08/31/2014    CBG (last 3)   Recent Labs  10/02/14 2107 10/02/14 2228 10/03/14 0610  GLUCAP 185* 197* 132*     5. Bladder outflow obstruction. Required Foley in ER, placed on alpha blocker and will require outpatient urology follow-up.    6.Asymptomatic R Bakers Cysts - outpt Ortho follow up.     Code Status: Full  Family Communication:  none  Disposition Plan: Home   Procedures  TTE  08-2014  - Left ventricle: There is 1.5cm x 1.5cm clot in the apex. Byreport, this clot has been seen in the past. The cavity size wasmoderately to severely dilated. Wall thickness was increased in apattern of moderate to severe LVH. The estimated ejectionfraction was 25%. Diffuse hypokinesis. Doppler parameters areconsistent with high ventricular filling pressure. - Left atrium: The atrium was moderately dilated. - Right ventricle: There is some RV dysfunction. Not able to assess RV function due to poor visualization   Vascular US  - No obvious evidence of deep vein or superficial thrombosisinvolving the right lower extremity and left lower extremity.Interstitial fluid is noted in both calves. - Incidental findings are consistent with: Baker&'s Cyst on the right. - No evidence of Baker&'s cyst on the left.   Renal US   Normal renal ultrasound. Negative for obstruction or stones.   Consults  Cards   Medications  Scheduled Meds: . apixaban  5 mg Oral BID  . carvedilol  25 mg Oral BID WC  . dextrose      . furosemide  60 mg Intravenous BID  . insulin aspart  0-15 Units Subcutaneous TID WC  . insulin aspart  0-5 Units Subcutaneous QHS  . insulin glargine  50 Units Subcutaneous BID  . lactulose  20 g Oral TID  . levothyroxine  187.5 mcg Oral QAC breakfast  . linagliptin  5  mg Oral Daily  . mexiletine  150 mg Oral 3 times per day  . rosuvastatin  10 mg Oral q1800  . sodium chloride  3 mL Intravenous Q12H   Continuous Infusions:  PRN Meds:.sodium chloride, acetaminophen, ondansetron (ZOFRAN) IV, sodium chloride  DVT Prophylaxis Eliquis  Lab Results  Component Value Date   PLT 70* 10/03/2014    Antibiotics     Anti-infectives    None          Objective:   Filed Vitals:   10/02/14 1306 10/02/14 1353 10/02/14 2020 10/03/14 0528  BP:  102/82 113/40 100/70  Pulse:  66 63 78  Temp: 98.1 F (36.7  C) 97.9 F (36.6 C) 97.5 F (36.4 C) 98 F (36.7 C)  TempSrc: Oral Oral Oral Oral  Resp:  20 18 18   Height:  6\' 1"  (1.854 m)    Weight:  150.2 kg (331 lb 2.1 oz)  150.277 kg (331 lb 4.8 oz)  SpO2:  100% 100% 100%    Wt Readings from Last 3 Encounters:  10/03/14 150.277 kg (331 lb 4.8 oz)  09/11/14 147.873 kg (326 lb)  09/06/14 142.9 kg (315 lb 0.6 oz)     Intake/Output Summary (Last 24 hours) at 10/03/14 1154 Last data filed at 10/03/14 1000  Gross per 24 hour  Intake    670 ml  Output   1550 ml  Net   -880 ml     Physical Exam  Awake Alert, Oriented X 3, No new F.N deficits, Normal affect [place].AT,PERRAL Supple Neck,No JVD, No cervical lymphadenopathy appriciated.  Symmetrical Chest wall movement, Good air movement bilaterally, +ve rales RRR,No Gallops,Rubs or new Murmurs, No Parasternal Heave +ve B.Sounds, Abd Soft, No tenderness, No organomegaly appriciated, No rebound - guarding or rigidity. No Cyanosis, Clubbing  No new Rash or bruise    1+ edema, Foley in place with mild hematuria  Data Review   Micro Results No results found for this or any previous visit (from the past 240 hour(s)).  Radiology Reports Dg Chest 2 View  10/02/2014   CLINICAL DATA:  Increase shortness of breath for 5 days. Increasing abdominal ascites and lower extremity edema. Cough, congestion for 5 days.  EXAM: CHEST  2 VIEW  COMPARISON:  08/31/2014  FINDINGS: Cardiomegaly with vascular congestion. Left pacer is unchanged. No overt edema. Bibasilar opacities are stable in likely reflect atelectasis or scarring. No effusions or acute bony abnormality.  IMPRESSION: Mild cardiomegaly and vascular congestion. Bibasilar atelectasis or scarring. No change.   Electronically Signed   By: Charlett NoseKevin  Dover M.D.   On: 10/02/2014 11:09     [place] Renal  10/02/2014   CLINICAL DATA:  Urinary retention. Worsening renal function. Evaluate for obstruction.  EXAM: RENAL/URINARY TRACT ULTRASOUND COMPLETE  COMPARISON:   CT 09/04/2014.  FINDINGS: Right Kidney:  Length: 11.7 cm. Echogenicity within normal limits. No mass or hydronephrosis visualized.  Left Kidney:  Length: 10.2 cm. Echogenicity within normal limits. No mass or hydronephrosis visualized.  Bladder:  Collapse with Foley catheter.  IMPRESSION: Normal renal ultrasound.  Negative for obstruction or stones.   Electronically Signed   By: Andreas NewportGeoffrey  Lamke M.D.   On: 10/02/2014 13:59     CBC  Recent Labs Lab 10/02/14 0957 10/03/14 0439  WBC 6.0 6.2  HGB 9.0* 8.9*  HCT 29.2* 29.5*  PLT 75* 70*  MCV 85.6 84.8  MCH 26.4 25.6*  MCHC 30.8 30.2  RDW 15.7* 15.8*  LYMPHSABS  --  1.0  MONOABS  --  0.9  EOSABS  --  0.1  BASOSABS  --  0.0    Chemistries   Recent Labs Lab 10/02/14 0957 10/03/14 0439  NA 140 141  K 6.4* 6.7*  CL 114* 114*  CO2 20 19  GLUCOSE 133* 156*  BUN 63* 64*  CREATININE 3.15* 2.82*  CALCIUM 8.9 9.2  AST 77*  --   ALT 38  --   ALKPHOS 86  --   BILITOT 0.4  --    ------------------------------------------------------------------------------------------------------------------ estimated creatinine clearance is 43.7 mL/min (by C-G formula based on Cr of 2.82). ------------------------------------------------------------------------------------------------------------------ No results for input(s): HGBA1C in the last 72 hours. ------------------------------------------------------------------------------------------------------------------ No results for input(s): CHOL, HDL, LDLCALC, TRIG, CHOLHDL, LDLDIRECT in the last 72 hours. ------------------------------------------------------------------------------------------------------------------ No results for input(s): TSH, T4TOTAL, T3FREE, THYROIDAB in the last 72 hours.  Invalid input(s): FREET3 ------------------------------------------------------------------------------------------------------------------ No results for input(s): VITAMINB12, FOLATE, FERRITIN, TIBC,  IRON, RETICCTPCT in the last 72 hours.  Coagulation profile No results for input(s): INR, PROTIME in the last 168 hours.  No results for input(s): DDIMER in the last 72 hours.  Cardiac Enzymes  Recent Labs Lab 10/02/14 0957  TROPONINI 0.20*   ------------------------------------------------------------------------------------------------------------------ Invalid input(s): POCBNP     Time Spent in minutes  35   Emojean Gertz K M.D on 10/03/2014 at 11:54 AM  Between 7am to 7pm - Pager - [phone]  After 7pm go to [email or web address] - password [facility]  [facility] Office  [phone]

## 2014-10-03 NOTE — Progress Notes (Signed)
Lab notified night nurse of critical lab, K 6.7 nigh nurse notified day nurse of elevated potassium she stated that she would notify doc.

## 2014-10-03 NOTE — Progress Notes (Signed)
Pt's k6.2 called in by Doran Durand from lab.  Notified Dr. Thedore Mins via text page.  Will continue to monitor.  Ramiah Helfrich,RN.

## 2014-10-03 NOTE — Progress Notes (Signed)
K.6.2 collected at 1108.  Will continue to monitor.  Made Dr. Thedore Mins made aware.

## 2014-10-03 NOTE — Progress Notes (Signed)
Pt's K6.7.  Result received by off going nurse that was called in by Lab this am.  Result sent to Dr. Thedore Mins.   Will continue to monitor.  Amanda Pea, Charity fundraiser.

## 2014-10-03 NOTE — Significant Event (Signed)
CRITICAL VALUE ALERT  Critical value received:  Potassium of 6.7  Date of notification:  10-03-14  Time of notification:  7:20  Critical value read back:Yes.    Nurse who received alert:  Serafina Mitchell  MD notified (1st page):    Time of first page:    MD notified (2nd page):  Time of second page:  Responding MD:   Time MD responded:

## 2014-10-04 ENCOUNTER — Inpatient Hospital Stay (HOSPITAL_COMMUNITY): Payer: Medicare Other

## 2014-10-04 DIAGNOSIS — I5043 Acute on chronic combined systolic (congestive) and diastolic (congestive) heart failure: Principal | ICD-10-CM

## 2014-10-04 LAB — BODY FLUID CELL COUNT WITH DIFFERENTIAL
Lymphs, Fluid: 51 %
MONOCYTE-MACROPHAGE-SEROUS FLUID: 45 % — AB (ref 50–90)
Neutrophil Count, Fluid: 4 % (ref 0–25)
WBC FLUID: 739 uL (ref 0–1000)

## 2014-10-04 LAB — BASIC METABOLIC PANEL
ANION GAP: 5 (ref 5–15)
BUN: 63 mg/dL — AB (ref 6–23)
CO2: 19 mmol/L (ref 19–32)
Calcium: 9 mg/dL (ref 8.4–10.5)
Chloride: 116 mEq/L — ABNORMAL HIGH (ref 96–112)
Creatinine, Ser: 2.69 mg/dL — ABNORMAL HIGH (ref 0.50–1.35)
GFR calc Af Amer: 28 mL/min — ABNORMAL LOW (ref >90)
GFR, EST NON AFRICAN AMERICAN: 24 mL/min — AB (ref >90)
GLUCOSE: 171 mg/dL — AB (ref 70–99)
Potassium: 6.2 mmol/L (ref 3.5–5.1)
SODIUM: 140 mmol/L (ref 135–145)

## 2014-10-04 LAB — HEPATIC FUNCTION PANEL
ALBUMIN: 3.1 g/dL — AB (ref 3.5–5.2)
ALK PHOS: 81 U/L (ref 39–117)
ALT: 40 U/L (ref 0–53)
AST: 64 U/L — ABNORMAL HIGH (ref 0–37)
Bilirubin, Direct: 0.2 mg/dL (ref 0.0–0.3)
Indirect Bilirubin: 0.4 mg/dL (ref 0.3–0.9)
TOTAL PROTEIN: 6.6 g/dL (ref 6.0–8.3)
Total Bilirubin: 0.6 mg/dL (ref 0.3–1.2)

## 2014-10-04 LAB — GLUCOSE, CAPILLARY
GLUCOSE-CAPILLARY: 162 mg/dL — AB (ref 70–99)
GLUCOSE-CAPILLARY: 165 mg/dL — AB (ref 70–99)
Glucose-Capillary: 115 mg/dL — ABNORMAL HIGH (ref 70–99)
Glucose-Capillary: 159 mg/dL — ABNORMAL HIGH (ref 70–99)

## 2014-10-04 LAB — POTASSIUM
POTASSIUM: 6.2 mmol/L — AB (ref 3.5–5.1)
POTASSIUM: 6.2 mmol/L — AB (ref 3.5–5.1)

## 2014-10-04 LAB — ALBUMIN, FLUID (OTHER): Albumin, Fluid: <1 g/dL

## 2014-10-04 LAB — TROPONIN I: TROPONIN I: 0.23 ng/mL — AB (ref <0.031)

## 2014-10-04 MED ORDER — FUROSEMIDE 10 MG/ML IJ SOLN
120.0000 mg | Freq: Three times a day (TID) | INTRAVENOUS | Status: DC
  Administered 2014-10-04 – 2014-10-05 (×3): 120 mg via INTRAVENOUS
  Filled 2014-10-04 (×6): qty 12

## 2014-10-04 MED ORDER — METOLAZONE 5 MG PO TABS
5.0000 mg | ORAL_TABLET | Freq: Every day | ORAL | Status: AC
  Administered 2014-10-04 – 2014-10-06 (×3): 5 mg via ORAL
  Filled 2014-10-04 (×3): qty 1

## 2014-10-04 MED ORDER — LIDOCAINE HCL (PF) 1 % IJ SOLN
INTRAMUSCULAR | Status: AC
  Filled 2014-10-04: qty 10

## 2014-10-04 MED ORDER — SODIUM POLYSTYRENE SULFONATE 15 GM/60ML PO SUSP
15.0000 g | Freq: Once | ORAL | Status: AC
  Administered 2014-10-04: 15 g via ORAL
  Filled 2014-10-04: qty 60

## 2014-10-04 MED ORDER — SODIUM POLYSTYRENE SULFONATE 15 GM/60ML PO SUSP
30.0000 g | Freq: Once | ORAL | Status: AC
  Administered 2014-10-04: 30 g via ORAL
  Filled 2014-10-04: qty 120

## 2014-10-04 NOTE — Progress Notes (Signed)
Patient has returned to unit from ultrasound. Patient alert and oriented x4. Patient has puncture wound to RLQ, which is covered with gauze, dry and intact. Patient states abdomen does not feel as firm as prior to procedure. No complaints at this time. Will continue to monitor.

## 2014-10-04 NOTE — Progress Notes (Signed)
Pt's K was 6.2, which was the same earlier pt is still having frequent BM's due to Lactulose and k-exelate that was given earlier, MD notified and ordered K level at 7am this morning; pt's Tropnin 0.23, which is the same as the last one as well, no S/S,  MD aware of the positive Troponin's, will continue to monitor, Thanks Lavonda Jumbo RN

## 2014-10-04 NOTE — Progress Notes (Signed)
CRITICAL VALUE ALERT  Critical value received:  Potassium 6.2  Date of notification: 10/04/14     Time of notification:  0838    Critical value read back: yes  Nurse who received alert:  Bernadene Bell, RN  MD notified (1st page): Dr. Thedore Mins  Time of first page:  [phone]  MD notified (2nd page):  Time of second page:  Responding MD:  Dr. Thedore Mins  Time MD responded:  [id number]

## 2014-10-04 NOTE — Progress Notes (Signed)
    Subjective:  Shortness of breath unchanged. No chest pain. Feels bloated/swollen. Complains of pain related to Foley catheter.  Objective:  Vital Signs in the last 24 hours: Temp:  [97.3 F (36.3 C)-98.6 F (37 C)] 98.6 F (37 C) (01/01 0520) Pulse Rate:  [60-70] 62 (01/01 0520) Resp:  [18-20] 20 (01/01 0520) BP: (100-128)/(55-89) 115/55 mmHg (01/01 0520) SpO2:  [100 %] 100 % (01/01 0520) Weight:  [325 lb 13.4 oz (147.8 kg)] 325 lb 13.4 oz (147.8 kg) (01/01 0520)  Intake/Output from previous day: 12/31 0701 - 01/01 0700 In: 1100 [P.O.:1100] Out: 1528 [Urine:1525; Stool:3]  Physical Exam: Pt is alert and oriented, obese male in NAD HEENT: normal Neck: JVP - severely elevated Lungs: CTA bilaterally except for diminished BS in bases. CV: RRR without murmur or gallop, distant Abd: firm, distended, nontender, positive bowel sounds Ext: 3+ pretibial and pedal edema Skin: warm/dry no rash   Lab Results:  Recent Labs  10/02/14 0957 10/03/14 0439  WBC 6.0 6.2  HGB 9.0* 8.9*  PLT 75* 70*    Recent Labs  10/03/14 0439  10/03/14 1600 10/04/14 0038  NA 141  --   --  140  K 6.7*  < > 6.2* 6.2*  CL 114*  --   --  116*  CO2 19  --   --  19  GLUCOSE 156*  --   --  171*  BUN 64*  --   --  63*  CREATININE 2.82*  --   --  2.69*  < > = values in this interval not displayed.  Recent Labs  10/03/14 1600 10/04/14 0038  TROPONINI 0.23* 0.23*    Cardiac Studies: 2D Echo 09/01/2014: Study Conclusions  - Left ventricle: There is 1.5cm x 1.5cm clot in the apex. By report, this clot has been seen in the past. The cavity size was moderately to severely dilated. Wall thickness was increased in a pattern of moderate to severe LVH. The estimated ejection fraction was 25%. Diffuse hypokinesis. Doppler parameters are consistent with high ventricular filling pressure. - Left atrium: The atrium was moderately dilated. - Right ventricle: There is some RV  dysfunction. Not able to assess RV function due to poor visualization. Not able to assess RV size due to poor visualization.  Tele: AV-paced  Assessment/Plan:  1. Acute on chronic systolic heart failure, NYHA Class 4, with massive volume overload 2. Cirrhosis 3. Acute Kidney Injury on background of CKD Stage 3 4. Hyperkalemia 5. 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 I think he has at least 20# of excess fluid on board. Will add metolazone 5 mg and increase lasix to 120 mg Q 8 hours. If he doesn't respond would consider milrinone/lasix gtt and Advanced HF Consultation. Plan discussed with patient.   Tonny Bollman, M.D. 10/04/2014, 7:44 AM

## 2014-10-04 NOTE — Progress Notes (Signed)
Patient Demographics  [name], is a 59 y.o. male, DOB - 1955/10/20, JGG:[id number]  Admit date - 10/02/2014   Admitting Physician Joseph Art, DO  Outpatient Primary MD for the patient is [name], MD  LOS - 2   Chief Complaint  Patient presents with  . Shortness of Breath  . Edema        Subjective:   [name] today has, No headache, No chest pain, No abdominal pain - No Nausea, No new weakness tingling or numbness, No Cough - SOB.   Assessment & Plan    1. Acute on chronic respiratory failure secondary to acute on chronic diastolic and systolic CHF. EF 25%, has AICD, and history of nonischemic cardiomyopathy.  Continue IV Lasix dose increased by cardiology and Zaroxolyn added, on Coreg, not on ACE/ARB secondary to renal failure, cardiology consulted, continue salt and fluid restriction, daily weight, intake and output monitoring.   Filed Weights   10/02/14 1353 10/03/14 0528 10/04/14 0520  Weight: 150.2 kg (331 lb 2.1 oz) 150.277 kg (331 lb 4.8 oz) 147.8 kg (325 lb 13.4 oz)      2.NSTEMI - follow troponin trend, likely due to #1 above, cardiology consulted, chest pain free. Continue beta blocker , Eliquis along with statin.    3. ARF with hyperkalemia on chronic kidney disease stage IV. Baseline creatinine around 2.5, hold nephrotoxins, this likely is due to CHF decompensation, improving with diuresis, hyperkalemia protocol being followed, on Kayexalate and increased dose diuretics. We will monitor potassium closely. On telemetry.    4. DM type II. Continue Lantus and sliding scale  Lab Results  Component Value Date   HGBA1C 6.8* 08/31/2014    CBG (last 3)   Recent Labs  10/03/14 1643 10/03/14 2102 10/04/14 0627  GLUCAP 171* 150* 115*     5.  Bladder outflow obstruction. Required Foley in ER, placed on alpha blocker and will require outpatient urology follow-up.    6.Asymptomatic R Bakers Cysts - outpt Ortho follow up.    7. Ascites with Likely undiagnosed cirrhosis on previous CT scan. Has history of alcohol use in the past, we'll check hepatitis panel, diagnostic and therapeutic paracentesis ordered, will set him up with GI outpatient post discharge.    8. Chronic thrombocytopenia. Likely due to undiagnosed cirrhosis. Monitor.    9. Dyslipidemia. On statin continue.     Code Status: Full  Family Communication: none  Disposition Plan: Home   Procedures   Paracentesis   TTE  08-2014  - Left ventricle: There is 1.5cm x 1.5cm clot in the apex. Byreport, this clot has been seen in the past. The cavity size wasmoderately to severely dilated. Wall thickness was increased in apattern of moderate to severe LVH. The estimated ejectionfraction was 25%. Diffuse hypokinesis. Doppler parameters areconsistent with high ventricular filling pressure. - Left atrium: The atrium was moderately dilated. - Right ventricle: There is some RV dysfunction. Not able to assess RV function due to poor visualization   Vascular US  - No obvious evidence of deep vein or superficial thrombosisinvolving the right lower extremity and left lower extremity.Interstitial fluid is noted in both calves. - Incidental findings are consistent with: Baker&'s Cyst on the right. - No evidence of Baker&'s  cyst on the left.   Renal US   Normal renal ultrasound. Negative for obstruction or stones.   Consults  Cards   Medications  Scheduled Meds: . apixaban  5 mg Oral BID  . carvedilol  25 mg Oral BID WC  . furosemide  120 mg Intravenous 3 times per day  . insulin aspart  0-15 Units Subcutaneous TID WC  . insulin aspart  0-5 Units Subcutaneous QHS  . insulin glargine  50 Units Subcutaneous BID  . lactulose  20 g Oral TID   . levothyroxine  187.5 mcg Oral QAC breakfast  . lidocaine (PF)      . linagliptin  5 mg Oral Daily  . metolazone  5 mg Oral Daily  . mexiletine  150 mg Oral 3 times per day  . rosuvastatin  10 mg Oral q1800  . sodium chloride  3 mL Intravenous Q12H  . tamsulosin  0.4 mg Oral Daily   Continuous Infusions:  PRN Meds:.sodium chloride, acetaminophen, ondansetron (ZOFRAN) IV, sodium chloride  DVT Prophylaxis Eliquis  Lab Results  Component Value Date   PLT 70* 10/03/2014    Antibiotics     Anti-infectives    None          Objective:   Filed Vitals:   10/04/14 0938 10/04/14 1006 10/04/14 1023 10/04/14 1035  BP: 102/47 97/48 96/46  109/53  Pulse:    63  Temp:    98.2 F (36.8 C)  TempSrc:    Oral  Resp:    20  Height:      Weight:      SpO2:    100%    Wt Readings from Last 3 Encounters:  10/04/14 147.8 kg (325 lb 13.4 oz)  09/11/14 147.873 kg (326 lb)  09/06/14 142.9 kg (315 lb 0.6 oz)     Intake/Output Summary (Last 24 hours) at 10/04/14 1129 Last data filed at 10/04/14 1038  Gross per 24 hour  Intake    980 ml  Output   1199 ml  Net   -219 ml     Physical Exam  Awake Alert, Oriented X 3, No new F.N deficits, Normal affect [place].AT,PERRAL Supple Neck,No JVD, No cervical lymphadenopathy appriciated.  Symmetrical Chest wall movement, Good air movement bilaterally, +ve rales RRR,No Gallops,Rubs or new Murmurs, No Parasternal Heave +ve B.Sounds, Abd distended, No tenderness, No organomegaly appriciated, No rebound - guarding or rigidity. No Cyanosis, Clubbing  No new Rash or bruise    1+ edema, Foley in place with mild hematuria  Data Review   Micro Results No results found for this or any previous visit (from the past 240 hour(s)).  Radiology Reports Dg Chest 2 View  10/02/2014   CLINICAL DATA:  Increase shortness of breath for 5 days. Increasing abdominal ascites and lower extremity edema. Cough, congestion for 5 days.  EXAM: CHEST  2 VIEW   COMPARISON:  08/31/2014  FINDINGS: Cardiomegaly with vascular congestion. Left pacer is unchanged. No overt edema. Bibasilar opacities are stable in likely reflect atelectasis or scarring. No effusions or acute bony abnormality.  IMPRESSION: Mild cardiomegaly and vascular congestion. Bibasilar atelectasis or scarring. No change.   Electronically Signed   By: Charlett Nose M.D.   On: 10/02/2014 11:09     US Renal  10/02/2014   CLINICAL DATA:  Urinary retention. Worsening renal function. Evaluate for obstruction.  EXAM: RENAL/URINARY TRACT ULTRASOUND COMPLETE  COMPARISON:  CT 09/04/2014.  FINDINGS: Right Kidney:  Length: 11.7 cm. Echogenicity within normal limits.  No mass or hydronephrosis visualized.  Left Kidney:  Length: 10.2 cm. Echogenicity within normal limits. No mass or hydronephrosis visualized.  Bladder:  Collapse with Foley catheter.  IMPRESSION: Normal renal ultrasound.  Negative for obstruction or stones.   Electronically Signed   By: Andreas Newport M.D.   On: 10/02/2014 13:59     CBC  Recent Labs Lab 10/02/14 0957 10/03/14 0439  WBC 6.0 6.2  HGB 9.0* 8.9*  HCT 29.2* 29.5*  PLT 75* 70*  MCV 85.6 84.8  MCH 26.4 25.6*  MCHC 30.8 30.2  RDW 15.7* 15.8*  LYMPHSABS  --  1.0  MONOABS  --  0.9  EOSABS  --  0.1  BASOSABS  --  0.0    Chemistries   Recent Labs Lab 10/02/14 0957 10/03/14 0439 10/03/14 1108 10/03/14 1600 10/04/14 0038 10/04/14 0730 10/04/14 0915  NA 140 141  --   --  140  --   --   K 6.4* 6.7* 6.2* 6.2* 6.2* 6.2*  --   CL 114* 114*  --   --  116*  --   --   CO2 20 19  --   --  19  --   --   GLUCOSE 133* 156*  --   --  171*  --   --   BUN 63* 64*  --   --  63*  --   --   CREATININE 3.15* 2.82*  --   --  2.69*  --   --   CALCIUM 8.9 9.2  --   --  9.0  --   --   AST 77*  --   --   --   --   --  64*  ALT 38  --   --   --   --   --  40  ALKPHOS 86  --   --   --   --   --  81  BILITOT 0.4  --   --   --   --   --  0.6    ------------------------------------------------------------------------------------------------------------------ estimated creatinine clearance is 45.3 mL/min (by C-G formula based on Cr of 2.69). ------------------------------------------------------------------------------------------------------------------ No results for input(s): HGBA1C in the last 72 hours. ------------------------------------------------------------------------------------------------------------------ No results for input(s): CHOL, HDL, LDLCALC, TRIG, CHOLHDL, LDLDIRECT in the last 72 hours. ------------------------------------------------------------------------------------------------------------------ No results for input(s): TSH, T4TOTAL, T3FREE, THYROIDAB in the last 72 hours.  Invalid input(s): FREET3 ------------------------------------------------------------------------------------------------------------------ No results for input(s): VITAMINB12, FOLATE, FERRITIN, TIBC, IRON, RETICCTPCT in the last 72 hours.  Coagulation profile No results for input(s): INR, PROTIME in the last 168 hours.  No results for input(s): DDIMER in the last 72 hours.  Cardiac Enzymes  Recent Labs Lab 10/03/14 0916 10/03/14 1600 10/04/14 0038  TROPONINI 0.22* 0.23* 0.23*   ------------------------------------------------------------------------------------------------------------------ Invalid input(s): POCBNP     Time Spent in minutes  35   SINGH,PRASHANT K M.D on 10/04/2014 at 11:29 AM  Between 7am to 7pm - Pager - [phone]  After 7pm go to [email or web address] - password [facility]  [facility] Office  [phone]

## 2014-10-04 NOTE — Procedures (Signed)
Successful US guided paracentesis from RLQ.  Yielded 1.5L of cloudy amber colored fluid.  No immediate complications.  Pt tolerated well.   Specimen was sent for labs.  Brayton El PA-C 10/04/2014 10:33 AM

## 2014-10-04 NOTE — Progress Notes (Signed)
Repeat serum potassium result 6.2. Text page sent to Dr. Thedore Mins to make aware. Will continue to monitor patient.

## 2014-10-05 LAB — GLUCOSE, CAPILLARY
Glucose-Capillary: 105 mg/dL — ABNORMAL HIGH (ref 70–99)
Glucose-Capillary: 131 mg/dL — ABNORMAL HIGH (ref 70–99)
Glucose-Capillary: 142 mg/dL — ABNORMAL HIGH (ref 70–99)
Glucose-Capillary: 144 mg/dL — ABNORMAL HIGH (ref 70–99)

## 2014-10-05 LAB — BASIC METABOLIC PANEL
ANION GAP: 7 (ref 5–15)
BUN: 61 mg/dL — ABNORMAL HIGH (ref 6–23)
CALCIUM: 9.4 mg/dL (ref 8.4–10.5)
CHLORIDE: 113 meq/L — AB (ref 96–112)
CO2: 21 mmol/L (ref 19–32)
Creatinine, Ser: 2.64 mg/dL — ABNORMAL HIGH (ref 0.50–1.35)
GFR calc Af Amer: 29 mL/min — ABNORMAL LOW (ref >90)
GFR calc non Af Amer: 25 mL/min — ABNORMAL LOW (ref >90)
GLUCOSE: 173 mg/dL — AB (ref 70–99)
Potassium: 5.9 mmol/L — ABNORMAL HIGH (ref 3.5–5.1)
Sodium: 141 mmol/L (ref 135–145)

## 2014-10-05 MED ORDER — CARVEDILOL 6.25 MG PO TABS
6.2500 mg | ORAL_TABLET | Freq: Two times a day (BID) | ORAL | Status: DC
  Administered 2014-10-05 – 2014-10-06 (×2): 6.25 mg via ORAL
  Filled 2014-10-05 (×4): qty 1

## 2014-10-05 MED ORDER — FUROSEMIDE 10 MG/ML IJ SOLN
15.0000 mg/h | INTRAVENOUS | Status: DC
  Administered 2014-10-05 – 2014-10-08 (×3): 15 mg/h via INTRAVENOUS
  Filled 2014-10-05 (×8): qty 25

## 2014-10-05 NOTE — Progress Notes (Signed)
Patient Demographics  Paolo Okane, is a 59 y.o. male, DOB - 1956/08/29, ZOX:[id number]  Admit date - 10/02/2014   Admitting Physician Joseph Art, DO  Outpatient Primary MD for the patient is [name], MD  LOS - 3   Chief Complaint  Patient presents with  . Shortness of Breath  . Edema        Subjective:   [name] today has, No headache, No chest pain, No abdominal pain - No Nausea, No new weakness tingling or numbness, No Cough - improved SOB.   Assessment & Plan    1. Acute on chronic respiratory failure secondary to acute on chronic diastolic and systolic CHF. EF 25%, has AICD, and history of nonischemic cardiomyopathy.  Continue IV Lasix dose increased by cardiology and Zaroxolyn added, on Coreg, not on ACE/ARB secondary to renal failure, cardiology and renal consulted, continue salt and fluid restriction, daily weight, intake and output monitoring.   Filed Weights   10/03/14 0528 10/04/14 0520 10/05/14 0537  Weight: 150.277 kg (331 lb 4.8 oz) 147.8 kg (325 lb 13.4 oz) 145.015 kg (319 lb 11.2 oz)      2.NSTEMI with LV clot- follow troponin trend, likely due to #1 above, cardiology consulted, chest pain free. Continue beta blocker , Eliquis for LV clot along with statin.    3. ARF with hyperkalemia on chronic kidney disease stage IV. Baseline creatinine around 2.5, hold nephrotoxins, this likely is due to CHF decompensation, improving with diuresis, hyperkalemia protocol being followed, on Kayexalate and increased dose diuretics. We will monitor potassium closely. On telemetry. Renal requested to evaluate.    4. DM type II. Continue Lantus and sliding scale  Lab Results  Component Value Date   HGBA1C 6.8* 08/31/2014    CBG (last 3)   Recent Labs   10/04/14 1625 10/04/14 2115 10/05/14 0744  GLUCAP 162* 165* 105*     5. Bladder outflow obstruction. Required Foley in ER, placed on alpha blocker and will require outpatient urology follow-up.    6.Asymptomatic R Bakers Cysts - outpt Ortho follow up.    7. Ascites with Likely undiagnosed cirrhosis on previous CT scan. Has history of alcohol use in the past, we'll check hepatitis panel, diagnostic and therapeutic paracentesis done on 10/04/2014 with 1.5 L sciatic fluid removed, SAAG 2 pointing towards CHF and portal hypertension, will set him up with GI outpatient post discharge.    8. Chronic thrombocytopenia. Likely due to undiagnosed cirrhosis. Monitor.    9. Dyslipidemia. On statin continue.     Code Status: Full  Family Communication: none  Disposition Plan: Home   Procedures   Paracentesis - 1.5 lits removed, SAAG 2   TTE  08-2014  - Left ventricle: There is 1.5cm x 1.5cm clot in the apex. Byreport, this clot has been seen in the past. The cavity size wasmoderately to severely dilated. Wall thickness was increased in apattern of moderate to severe LVH. The estimated ejectionfraction was 25%. Diffuse hypokinesis. Doppler parameters areconsistent with high ventricular filling pressure. - Left atrium: The atrium was moderately dilated. - Right ventricle: There is some RV dysfunction. Not able to assess RV function due to poor visualization   Vascular US  - No obvious evidence of deep  vein or superficial thrombosisinvolving the right lower extremity and left lower extremity.Interstitial fluid is noted in both calves. - Incidental findings are consistent with: Baker&'s Cyst on the right. - No evidence of Baker&'s cyst on the left.   Renal US   Normal renal ultrasound. Negative for obstruction or stones.   Consults  Cards, renal   Medications  Scheduled Meds: . apixaban  5 mg Oral BID  . carvedilol  6.25 mg Oral BID WC  . furosemide   120 mg Intravenous 3 times per day  . insulin aspart  0-15 Units Subcutaneous TID WC  . insulin aspart  0-5 Units Subcutaneous QHS  . insulin glargine  50 Units Subcutaneous BID  . lactulose  20 g Oral TID  . levothyroxine  187.5 mcg Oral QAC breakfast  . linagliptin  5 mg Oral Daily  . metolazone  5 mg Oral Daily  . mexiletine  150 mg Oral 3 times per day  . rosuvastatin  10 mg Oral q1800  . sodium chloride  3 mL Intravenous Q12H  . tamsulosin  0.4 mg Oral Daily   Continuous Infusions:  PRN Meds:.sodium chloride, acetaminophen, ondansetron (ZOFRAN) IV, sodium chloride  DVT Prophylaxis Eliquis  Lab Results  Component Value Date   PLT 70* 10/03/2014    Antibiotics     Anti-infectives    None          Objective:   Filed Vitals:   10/04/14 1409 10/04/14 2213 10/05/14 0537 10/05/14 0930  BP: 100/49 99/67 109/65 98/59  Pulse: 77 58 73 63  Temp: 98.7 F (37.1 C) 97.7 F (36.5 C) 97.7 F (36.5 C) 97.8 F (36.6 C)  TempSrc: Oral Oral Oral Oral  Resp: Height:      Weight:   145.015 kg (319 lb 11.2 oz)   SpO2: 100% 94% 99% 100%    Wt Readings from Last 3 Encounters:  10/05/14 145.015 kg (319 lb 11.2 oz)  09/11/14 147.873 kg (326 lb)  09/06/14 142.9 kg (315 lb 0.6 oz)     Intake/Output Summary (Last 24 hours) at 10/05/14 1013 Last data filed at 10/05/14 0803  Gross per 24 hour  Intake   1266 ml  Output   1572 ml  Net   -306 ml     Physical Exam  Awake Alert, Oriented X 3, No new F.N deficits, Normal affect .AT,PERRAL Supple Neck,No JVD, No cervical lymphadenopathy appriciated.  Symmetrical Chest wall movement, Good air movement bilaterally, +ve rales RRR,No Gallops,Rubs or new Murmurs, No Parasternal Heave +ve B.Sounds, Abd distended, No tenderness, No organomegaly appriciated, No rebound - guarding or rigidity. No Cyanosis, Clubbing  No new Rash or bruise    2+ edema, Foley in place with mild hematuria  Data Review   Micro  Results No results found for this or any previous visit (from the past 240 hour(s)).  Radiology Reports Dg Chest 2 View  10/02/2014   CLINICAL DATA:  Increase shortness of breath for 5 days. Increasing abdominal ascites and lower extremity edema. Cough, congestion for 5 days.  EXAM: CHEST  2 VIEW  COMPARISON:  08/31/2014  FINDINGS: Cardiomegaly with vascular congestion. Left pacer is unchanged. No overt edema. Bibasilar opacities are stable in likely reflect atelectasis or scarring. No effusions or acute bony abnormality.  IMPRESSION: Mild cardiomegaly and vascular congestion. Bibasilar atelectasis or scarring. No change.   Electronically Signed   By: Charlett Nose M.D.   On: 10/02/2014 11:09  US Renal  10/02/2014   CLINICAL DATA:  Urinary retention. Worsening renal function. Evaluate for obstruction.  EXAM: RENAL/URINARY TRACT ULTRASOUND COMPLETE  COMPARISON:  CT 09/04/2014.  FINDINGS: Right Kidney:  Length: 11.7 cm. Echogenicity within normal limits. No mass or hydronephrosis visualized.  Left Kidney:  Length: 10.2 cm. Echogenicity within normal limits. No mass or hydronephrosis visualized.  Bladder:  Collapse with Foley catheter.  IMPRESSION: Normal renal ultrasound.  Negative for obstruction or stones.   Electronically Signed   By: Andreas Newport M.D.   On: 10/02/2014 13:59     CBC  Recent Labs Lab 10/02/14 0957 10/03/14 0439  WBC 6.0 6.2  HGB 9.0* 8.9*  HCT 29.2* 29.5*  PLT 75* 70*  MCV 85.6 84.8  MCH 26.4 25.6*  MCHC 30.8 30.2  RDW 15.7* 15.8*  LYMPHSABS  --  1.0  MONOABS  --  0.9  EOSABS  --  0.1  BASOSABS  --  0.0    Chemistries   Recent Labs Lab 10/02/14 0957 10/03/14 0439  10/03/14 1600 10/04/14 0038 10/04/14 0730 10/04/14 0915 10/04/14 1457 10/05/14 0306  NA 140 141  --   --  140  --   --   --  141  K 6.4* 6.7*  < > 6.2* 6.2* 6.2*  --  6.2* 5.9*  CL 114* 114*  --   --  116*  --   --   --  113*  CO2 20 19  --   --  19  --   --   --  21  GLUCOSE 133* 156*   --   --  171*  --   --   --  173*  BUN 63* 64*  --   --  63*  --   --   --  61*  CREATININE 3.15* 2.82*  --   --  2.69*  --   --   --  2.64*  CALCIUM 8.9 9.2  --   --  9.0  --   --   --  9.4  AST 77*  --   --   --   --   --  64*  --   --   ALT 38  --   --   --   --   --  40  --   --   ALKPHOS 86  --   --   --   --   --  81  --   --   BILITOT 0.4  --   --   --   --   --  0.6  --   --   < > = values in this interval not displayed. ------------------------------------------------------------------------------------------------------------------ estimated creatinine clearance is 45.7 mL/min (by C-G formula based on Cr of 2.64). ------------------------------------------------------------------------------------------------------------------ No results for input(s): HGBA1C in the last 72 hours. ------------------------------------------------------------------------------------------------------------------ No results for input(s): CHOL, HDL, LDLCALC, TRIG, CHOLHDL, LDLDIRECT in the last 72 hours. ------------------------------------------------------------------------------------------------------------------ No results for input(s): TSH, T4TOTAL, T3FREE, THYROIDAB in the last 72 hours.  Invalid input(s): FREET3 ------------------------------------------------------------------------------------------------------------------ No results for input(s): VITAMINB12, FOLATE, FERRITIN, TIBC, IRON, RETICCTPCT in the last 72 hours.  Coagulation profile No results for input(s): INR, PROTIME in the last 168 hours.  No results for input(s): DDIMER in the last 72 hours.  Cardiac Enzymes  Recent Labs Lab 10/03/14 0916 10/03/14 1600 10/04/14 0038  TROPONINI 0.22* 0.23* 0.23*   ------------------------------------------------------------------------------------------------------------------ Invalid input(s): POCBNP     Time Spent in minutes  35  Leroy Sea M.D on 10/05/2014 at 10:13  AM  Between 7am to 7pm - Pager - [phone]  After 7pm go to [email or web address] - password [facility]  [facility] Office  [phone]

## 2014-10-05 NOTE — Consult Note (Signed)
Dakota Brook Sr. Admit Date: 10/02/2014 10/05/2014 Arita Miss Requesting Physician:  Burney Gauze  Reason for Consult:  Volume Overload, AoCKD, Hyperkalemia, diuretic resistance   HPI:  10M w/ CKD, BL 1.5-2 and recent admissions for A/C CHF exacerbation early 09/2014 who was admitted again 10/02/14 with progressive SOB, edema (swelling in legs and abdomen).  Has recently been diagosed with cirrhosis (ascited, ?NASH vs amio toxicity).  THroughout hospital course has also have hyperkalemia (was on low dose ACEi + MRB) upon arrival.  Despite increased attempts at diuresis (now 120 IV lasix TID+metolazone) pt has not had desired clinical response.    Albumin is 3.1.  Also has known LV thrombus.  Underwent LVP of 1.5L yesterday.  SAAG consistent with portal HTN.  TTE 08/2014 with LVEF 25% and 'some RV dysfunction' but poorly visualized.  No meaninful proteinuria on recent UAs.    UOP 970 yeaterday, 1.5L day before that, net negative 1L from admission. Weight down 12lb.     CREAT (mg/dL)  Date Value  27/78/2423 1.52*   CREATININE, SER (mg/dL)  Date Value  53/61/4431 2.64*  10/04/2014 2.69*  10/03/2014 2.82*  10/02/2014 3.15*  09/05/2014 1.70*  09/04/2014 1.79*  09/03/2014 2.13*  09/02/2014 2.38*  09/01/2014 2.24*  08/31/2014 2.51*  ] I/Os:  ROS NSAIDS: denies any use IV Contrast no exposure TMP/SMX no exopsure Hypotension no exposure Balance of 12 systems is negative w/ exceptions as above  PMH  Past Medical History  Diagnosis Date  . HYPERLIPIDEMIA-MIXED   . Obesity, unspecified   . HYPERTENSION, UNSPECIFIED   . CAD (coronary artery disease)     a. Nonobstructive moderate CAD by cath - last 2012.  [name] Hypertrophic obstructive cardiomyopathy   . Complete heart block     a. s/p pacemaker later upgraded to CRT-D.  [name] CKD (chronic kidney disease)     a. Baseline Cr 1.4-1.5.  [name] Chronic systolic CHF (congestive heart failure)     a. NICM (EF 25% in 2012, 20-25% in 09/2013).  b. s/p BiV-ICD (MDT).  . Colon polyps   . Ventricular tachycardia     a. EPS/RFCA of VT in 2012. b. On amio, mexilitene. c. Last BiV-ICD gen change 05/2013 (MDT).  . Hypothyroidism   . Fatty liver disease, nonalcoholic   . BPH (benign prostatic hyperplasia)   . Habitual alcohol use   . Liver cirrhosis   . Myocardial infarction   . AICD (automatic cardioverter/defibrillator) present 05/2013  . DM     insulin dependent    PSH  Past Surgical History  Procedure Laterality Date  . Pacemaker insertion      medtronic  . Cardiac catheterization    . Facial reconstruction surgery      after trauma in 2006  . Bi-ventricular implantable cardioverter defibrillator N/A 05/25/2013    Procedure: BI-VENTRICULAR IMPLANTABLE CARDIOVERTER DEFIBRILLATOR  (CRT-D);  Surgeon: Marinus Maw, MD;  Location[facility] CATH LAB;  Service: Cardiovascular;  Laterality: N/A;  . Back surgery     FH  Family History  Problem Relation Age of Onset  . Diabetic kidney disease Father   . Coronary artery disease Neg Hx    SH  reports that he quit smoking about 46 years ago. His smoking use included Cigarettes. He smoked 0.00 packs per day. He has never used smokeless tobacco. He reports that he drinks alcohol. He reports that he does not use illicit drugs. Allergies  Allergies  Allergen Reactions  . Ace Inhibitors  AVOID ALL  BECAUSE OF HEART DISEASE   . Nitroglycerin Other (See Comments)    Reaction unknown  . Procan Sr [Procainamide Hcl]     PT. HAS HEART DISEASE CALLED HYPERTHROPHIC CARDIOMYOPATHY  . Quinidine     PT. HAS HEART DISEASE CALLED HYPERTHROPHIC CARDIOMYOPATHY     Home medications Prior to Admission medications   Medication Sig Start Date End Date Taking? Authorizing Provider  apixaban (ELIQUIS) 5 MG TABS tablet Take 1 tablet (5 mg total) by mouth 2 (two) times daily. 09/23/14  Yes Marinus Maw, MD  carvedilol (COREG) 25 MG tablet Take 1 tablet (25 mg total) by mouth 2 (two) times daily with a  meal.   Yes Rollene Rotunda, MD  cholecalciferol (VITAMIN D) 1000 UNITS tablet Take 2,000 Units by mouth daily.    Yes Historical Provider, MD  fish oil-omega-3 fatty acids 1000 MG capsule Take 1 capsule by mouth 3 (three) times daily.    Yes Historical Provider, MD  furosemide (LASIX) 40 MG tablet Take 60 mg by mouth daily.  02/28/14  Yes Ernestina Penna, MD  insulin glargine (LANTUS) 100 UNIT/ML injection Inject 50 Units into the skin 2 (two) times daily.  07/25/14  Yes Tammy Eckard, PHARMD  insulin lispro (HUMALOG KWIKPEN) 100 UNIT/ML KiwkPen Inject 16 units with largest/evening meal.Dispense quikpens Patient taking differently: Inject 16 units with largest/evening meal.Dispense quikpens 02/28/14  Yes Ernestina Penna, MD  lactulose (CHRONULAC) 10 GM/15ML solution Take 30 mLs (20 g total) by mouth 3 (three) times daily. To maintain 2-3 loose bowel movements per day. 09/06/14  Yes Maryann Mikhail, DO  levothyroxine (SYNTHROID, LEVOTHROID) 150 MCG tablet Take 150 mcg by mouth daily before breakfast.   Yes Historical Provider, MD  levothyroxine (SYNTHROID, LEVOTHROID) 75 MCG tablet TAKE 1/2 TABLET (=37.5MCG) ALONG WITH LEVOTHYROXINE TO EQUAL 187. DAILY. Patient taking differently: TAKE 1/2 TABLET (=37.5MCG) BY MOUTH ALONG WITH LEVOTHYROXINE TO EQUAL 187. DAILY. 08/05/14  Yes Ernestina Penna, MD  lisinopril (PRINIVIL,ZESTRIL) 2.5 MG tablet TAKE 1 TABLET TWICE A DAY Patient taking differently: TAKE 1 TABLET BY MOUTH TWICE A DAY 08/21/14  Yes Ernestina Penna, MD  mexiletine (MEXITIL) 150 MG capsule TAKE 1 CAPSULE EVERY 8 HOURS Patient taking differently: TAKE 1 CAPSULE BY MOUTH EVERY 8 HOURS 02/28/14  Yes Ernestina Penna, MD  rosuvastatin (CRESTOR) 20 MG tablet Take 0.5 tablets (10 mg total) by mouth daily. 03/04/14  Yes Ernestina Penna, MD  sitaGLIPtin (JANUVIA) 50 MG tablet Take 1 tablet (50 mg total) by mouth daily. 09/03/13  Yes Tammy Eckard, PHARMD  spironolactone (ALDACTONE) 100 MG tablet  Take 50 mg by mouth daily. 09/25/14  Yes Historical Provider, MD  WELCHOL 625 MG tablet TAKE 1 TABLET THREE TIMES A DAY Patient taking differently: TAKE 1 TABLET BY MOUTH THREE TIMES A DAY 02/28/14  Yes Ernestina Penna, MD    Current Medications Scheduled Meds: . apixaban  5 mg Oral BID  . carvedilol  6.25 mg Oral BID WC  . insulin aspart  0-15 Units Subcutaneous TID WC  . insulin aspart  0-5 Units Subcutaneous QHS  . insulin glargine  50 Units Subcutaneous BID  . lactulose  20 g Oral TID  . levothyroxine  187.5 mcg Oral QAC breakfast  . linagliptin  5 mg Oral Daily  . metolazone  5 mg Oral Daily  . mexiletine  150 mg Oral 3 times per day  . rosuvastatin  10 mg Oral q1800  . sodium  chloride  3 mL Intravenous Q12H  . tamsulosin  0.4 mg Oral Daily   Continuous Infusions: . furosemide (LASIX) infusion     PRN Meds:.sodium chloride, acetaminophen, ondansetron (ZOFRAN) IV, sodium chloride  CBC  Recent Labs Lab 10/02/14 0957 10/03/14 0439  WBC 6.0 6.2  NEUTROABS  --  4.2  HGB 9.0* 8.9*  HCT 29.2* 29.5*  MCV 85.6 84.8  PLT 75* 70*   Basic Metabolic Panel  Recent Labs Lab 10/02/14 0957 10/03/14 0439 10/03/14 1108 10/03/14 1600 10/04/14 0038 10/04/14 0730 10/04/14 1457 10/05/14 0306  NA 140 141  --   --  140  --   --  141  K 6.4* 6.7* 6.2* 6.2* 6.2* 6.2* 6.2* 5.9*  CL 114* 114*  --   --  116*  --   --  113*  CO2 20 19  --   --  19  --   --  21  GLUCOSE 133* 156*  --   --  171*  --   --  173*  BUN 63* 64*  --   --  63*  --   --  61*  CREATININE 3.15* 2.82*  --   --  2.69*  --   --  2.64*  CALCIUM 8.9 9.2  --   --  9.0  --   --  9.4    Physical Exam  Blood pressure 98/59, pulse 63, temperature 97.8 F (36.6 C), temperature source Oral, resp. rate 20, height  (1.854 m), weight 145.015 kg (319 lb 11.2 oz), SpO2 100 %. GEN: NAD, obese ENT: NCAT EYES: EOMI CV: RRR PULM: faint bibasilar crackles ABD: distended and obese, +bs SKIN: no rashes/lesions, no  jaundice EXT:4+ LEE   Assessment/Plan 42M with A/C sCHF, Cirrhosis w/ portal HTN and Ascites, and AoCKD with poor response to IV diuresis.  Has had persistent hyperkalemia.   1. AoCKD 1. Has been stable in past several days 2. FOley catheter in place, Renal US 10/02/14 w/o obstruction 2. Hypervolemia; Poor Response to Diuretics 1. Not achieving goal UF w/ TID lasix 120 mg IV + metolazone 2. Change to lasix gtt today with bolus at beginning, /hr 3. Cont Metolazone 4. Na restriction 5. Challenging with decompensated CHF + Cirrhosis 3. Hyperkalemia 1. ? Lingering effect of ACEi, MRB and/or ineffective renal perfusion from cirrhosis / CHF 2. Would not give Kayexlalate if K < 6.5 3. Diuretics as above 4. Low K diet 4. Chronic systolic HF, HOCM: per cardiology 5. Cirrhosis, portal HTN, Ascites:  1. recent LVP 2. On lactulose 6. DM2 7. LV thrombus on Apixaban   Sabra Heck MD [phone] pgr 10/05/2014, 2:38 PM

## 2014-10-05 NOTE — Progress Notes (Signed)
    Subjective:  [name] a 59 yo with hx of CAD, CHF, acute on chronic kidney disease   He was admitted on 12/30 with increasing dyspnea.   Shortness of breath unchanged. No chest pain. Feels bloated/swollen. Complains of pain related to Foley catheter.  Objective:  Vital Signs in the last 24 hours: Temp:  [97.7 F (36.5 C)-98.7 F (37.1 C)] 97.8 F (36.6 C) (01/02 0930) Pulse Rate:  [58-77] 63 (01/02 0930) Resp:  [20] 20 (01/02 0930) BP: (98-109)/(49-67) 98/59 mmHg (01/02 0930) SpO2:  [94 %-100 %] 100 % (01/02 0930) Weight:  [319 lb 11.2 oz (145.015 kg)] 319 lb 11.2 oz (145.015 kg) (01/02 0537)  Intake/Output from previous day: 01/01 0701 - 01/02 0700 In: 1266 [P.O.:1080; IV Piggyback:186] Out: 973 [Urine:970; Stool:3]  Physical Exam: Pt is alert and oriented, obese male in NAD HEENT: normal Neck: JVP - severely elevated Lungs: CTA bilaterally except for diminished BS in bases. CV: RRR without murmur or gallop, distant Abd: firm, distended, nontender, positive bowel sounds Ext: 3+ pretibial and pedal edema Skin: warm/dry no rash   Lab Results:  Recent Labs  10/03/14 0439  WBC 6.2  HGB 8.9*  PLT 70*    Recent Labs  10/04/14 0038  10/04/14 1457 10/05/14 0306  NA 140  --   --  141  K 6.2*  < > 6.2* 5.9*  CL 116*  --   --  113*  CO2 19  --   --  21  GLUCOSE 171*  --   --  173*  BUN 63*  --   --  61*  CREATININE 2.69*  --   --  2.64*  < > = values in this interval not displayed.  Recent Labs  10/03/14 1600 10/04/14 0038  TROPONINI 0.23* 0.23*    Cardiac Studies: 2D Echo 09/01/2014: Study Conclusions  - Left ventricle: There is 1.5cm x 1.5cm clot in the apex. By report, this clot has been seen in the past. The cavity size was moderately to severely dilated. Wall thickness was increased in a pattern of moderate to severe LVH. The estimated ejection fraction was 25%. Diffuse hypokinesis. Doppler parameters are consistent with high  ventricular filling pressure. - Left atrium: The atrium was moderately dilated. - Right ventricle: There is some RV dysfunction. Not able to assess RV function due to poor visualization. Not able to assess RV size due to poor visualization.  Tele: AV-paced  Assessment/Plan:  1. Acute on chronic systolic heart failure, NYHA Class 4, with massive volume overload Has diuresed 1.1 liters. Continue current meds  He may need the assistance of the heart failure team   2. Cirrhosis 3. Acute Kidney Injury on background of CKD Stage 3 4. Hyperkalemia 5. Known LV thrombus on chronic anticoagulation   Vesta Mixer, Montez Hageman., MD, [facility] 10/05/2014, 11:49 AM [address],  [address] Office [phone] Pager [phone]

## 2014-10-06 ENCOUNTER — Inpatient Hospital Stay (HOSPITAL_COMMUNITY): Payer: Medicare Other

## 2014-10-06 DIAGNOSIS — I421 Obstructive hypertrophic cardiomyopathy: Secondary | ICD-10-CM

## 2014-10-06 DIAGNOSIS — R601 Generalized edema: Secondary | ICD-10-CM

## 2014-10-06 DIAGNOSIS — N179 Acute kidney failure, unspecified: Secondary | ICD-10-CM

## 2014-10-06 DIAGNOSIS — Z794 Long term (current) use of insulin: Secondary | ICD-10-CM

## 2014-10-06 DIAGNOSIS — I213 ST elevation (STEMI) myocardial infarction of unspecified site: Secondary | ICD-10-CM

## 2014-10-06 DIAGNOSIS — E119 Type 2 diabetes mellitus without complications: Secondary | ICD-10-CM

## 2014-10-06 DIAGNOSIS — Z95810 Presence of automatic (implantable) cardiac defibrillator: Secondary | ICD-10-CM

## 2014-10-06 DIAGNOSIS — E875 Hyperkalemia: Secondary | ICD-10-CM

## 2014-10-06 DIAGNOSIS — I472 Ventricular tachycardia: Secondary | ICD-10-CM

## 2014-10-06 DIAGNOSIS — K7460 Unspecified cirrhosis of liver: Secondary | ICD-10-CM

## 2014-10-06 LAB — HEPATITIS PANEL, ACUTE
HCV AB: NEGATIVE
HEP B C IGM: NONREACTIVE
Hep A IgM: NONREACTIVE
Hepatitis B Surface Ag: NEGATIVE

## 2014-10-06 LAB — BASIC METABOLIC PANEL
Anion gap: 8 (ref 5–15)
BUN: 64 mg/dL — AB (ref 6–23)
CALCIUM: 9 mg/dL (ref 8.4–10.5)
CO2: 20 mmol/L (ref 19–32)
CREATININE: 2.73 mg/dL — AB (ref 0.50–1.35)
Chloride: 112 mEq/L (ref 96–112)
GFR, EST AFRICAN AMERICAN: 28 mL/min — AB (ref >90)
GFR, EST NON AFRICAN AMERICAN: 24 mL/min — AB (ref >90)
GLUCOSE: 124 mg/dL — AB (ref 70–99)
POTASSIUM: 5.4 mmol/L — AB (ref 3.5–5.1)
SODIUM: 140 mmol/L (ref 135–145)

## 2014-10-06 LAB — GLUCOSE, CAPILLARY
Glucose-Capillary: 117 mg/dL — ABNORMAL HIGH (ref 70–99)
Glucose-Capillary: 104 mg/dL — ABNORMAL HIGH (ref 70–99)
Glucose-Capillary: 47 mg/dL — ABNORMAL LOW (ref 70–99)
Glucose-Capillary: 131 mg/dL — ABNORMAL HIGH (ref 70–99)
Glucose-Capillary: 128 mg/dL — ABNORMAL HIGH (ref 70–99)

## 2014-10-06 LAB — IRON AND TIBC
Iron: 37 ug/dL — ABNORMAL LOW (ref 42–165)
Saturation Ratios: 11 % — ABNORMAL LOW (ref 20–55)
TIBC: 352 ug/dL (ref 215–435)
UIBC: 315 ug/dL (ref 125–400)

## 2014-10-06 LAB — VITAMIN B12: Vitamin B-12: 390 pg/mL (ref 211–911)

## 2014-10-06 LAB — CBC
HEMATOCRIT: 27.4 % — AB (ref 39.0–52.0)
HEMOGLOBIN: 8.5 g/dL — AB (ref 13.0–17.0)
MCH: 26.7 pg (ref 26.0–34.0)
MCHC: 31 g/dL (ref 30.0–36.0)
MCV: 86.2 fL (ref 78.0–100.0)
PLATELETS: 61 10*3/uL — AB (ref 150–400)
RBC: 3.18 MIL/uL — AB (ref 4.22–5.81)
RDW: 16.1 % — ABNORMAL HIGH (ref 11.5–15.5)
WBC: 5.1 10*3/uL (ref 4.0–10.5)

## 2014-10-06 LAB — FOLATE: Folate: 7.7 ng/mL

## 2014-10-06 LAB — RETICULOCYTES
RBC.: 3.31 MIL/uL — AB (ref 4.22–5.81)
RETIC CT PCT: 1.4 % (ref 0.4–3.1)
Retic Count, Absolute: 46.3 10*3/uL (ref 19.0–186.0)

## 2014-10-06 LAB — FERRITIN: Ferritin: 154 ng/mL (ref 22–322)

## 2014-10-06 MED ORDER — CARVEDILOL 3.125 MG PO TABS
3.1250 mg | ORAL_TABLET | Freq: Two times a day (BID) | ORAL | Status: DC
Start: 2014-10-06 — End: 2014-10-07
  Administered 2014-10-06 – 2014-10-07 (×2): 3.125 mg via ORAL
  Filled 2014-10-06 (×4): qty 1

## 2014-10-06 NOTE — Progress Notes (Signed)
Pharmacist Heart Failure Core Measure Documentation  Assessment: Dakota KEILTY Sr. has an EF documented as 25% on 09/01/2014 by echo.  Rationale: Heart failure patients with left ventricular systolic dysfunction (LVSD) and an EF < 40% should be prescribed an angiotensin converting enzyme inhibitor (ACEI) or angiotensin receptor blocker (ARB) at discharge unless a contraindication is documented in the medical record.  This patient is not currently on an ACEI or ARB for HF.  This note is being placed in the record in order to provide documentation that a contraindication to the use of these agents is present for this encounter.  ACE Inhibitor or Angiotensin Receptor Blocker is contraindicated (specify all that apply)  []   ACEI allergy AND ARB allergy []   Angioedema []   Moderate or severe aortic stenosis [x]   Hyperkalemia []   Hypotension []   Renal artery stenosis [x]   Worsening renal function, preexisting renal disease or dysfunction   [facility], [address].D., BCPS Clinical Pharmacist Pager: [phone] 10/06/2014 11:32 AM

## 2014-10-06 NOTE — Progress Notes (Signed)
SUBJECTIVE: Pt denies chest pain. Says abdomen is much less firm/tense. Has lost 10 lbs.    Intake/Output Summary (Last 24 hours) at 10/06/14 0925 Last data filed at 10/06/14 1610  Gross per 24 hour  Intake   1667 ml  Output   2451 ml  Net   -784 ml    Current Facility-Administered Medications  Medication Dose Route Frequency Provider Last Rate Last Dose  . 0.9 %  sodium chloride infusion  250 mL Intravenous PRN Joseph Art, DO      . acetaminophen (TYLENOL) tablet 650 mg  650 mg Oral Q4H PRN Joseph Art, DO      . apixaban (ELIQUIS) tablet 5 mg  5 mg Oral BID Joseph Art, DO   5 mg at 10/05/14 2203  . carvedilol (COREG) tablet 6.25 mg  6.25 mg Oral BID WC Leroy Sea, MD   6.25 mg at 10/06/14 0646  . furosemide (LASIX) 250 mg in dextrose 5 % 250 mL (1 mg/mL) infusion  15 mg/hr Intravenous Continuous Arita Miss, MD 15 mL/hr at 10/05/14 1536 15 mg/hr at 10/05/14 1536  . insulin aspart (novoLOG) injection 0-15 Units  0-15 Units Subcutaneous TID WC Joseph Art, DO   2 Units at 10/05/14 1707  . insulin aspart (novoLOG) injection 0-5 Units  0-5 Units Subcutaneous QHS Joseph Art, DO   0 Units at 10/02/14 2229  . insulin glargine (LANTUS) injection 50 Units  50 Units Subcutaneous BID Joseph Art, DO   50 Units at 10/05/14 2210  . lactulose (CHRONULAC) 10 GM/15ML solution 20 g  20 g Oral TID Joseph Art, DO   20 g at 10/05/14 2203  . levothyroxine (SYNTHROID, LEVOTHROID) tablet 187.5 mcg  187.5 mcg Oral QAC breakfast Joseph Art, DO   187.5 mcg at 10/06/14 0646  . linagliptin (TRADJENTA) tablet 5 mg  5 mg Oral Daily Joseph Art, DO   5 mg at 10/05/14 0945  . metolazone (ZAROXOLYN) tablet 5 mg  5 mg Oral Daily Micheline Chapman, MD   5 mg at 10/05/14 0945  . mexiletine (MEXITIL) capsule 150 mg  150 mg Oral 3 times per day Joseph Art, DO   150 mg at 10/06/14 0646  . ondansetron (ZOFRAN) injection 4 mg  4 mg Intravenous Q6H PRN Joseph Art, DO       . rosuvastatin (CRESTOR) tablet 10 mg  10 mg Oral q1800 Joseph Art, DO   10 mg at 10/05/14 1707  . sodium chloride 0.9 % injection 3 mL  3 mL Intravenous Q12H Joseph Art, DO   3 mL at 10/05/14 2204  . sodium chloride 0.9 % injection 3 mL  3 mL Intravenous PRN Joseph Art, DO      . tamsulosin (FLOMAX) capsule 0.4 mg  0.4 mg Oral Daily Leroy Sea, MD   0.4 mg at 10/05/14 0945    Filed Vitals:   10/05/14 1430 10/05/14 2000 10/06/14 0500 10/06/14 0900  BP: 103/48 117/48 106/54 92/45  Pulse: 60 67 61 60  Temp: 97.8 F (36.6 C) 98.4 F (36.9 C) 98 F (36.7 C) 97.3 F (36.3 C)  TempSrc: Oral   Oral  Resp: Height:      Weight:   309 lb (140.161 kg)   SpO2: 99% 99% 99% 98%    PHYSICAL EXAM General: NAD HEENT: Normal. Neck: +JVD to angle  of jaw.  Lungs: Clear to auscultation bilaterally with normal respiratory effort. CV: Distant heart tones, RRR.  3+ pitting pretibial edema to thighs.    Abdomen: obese, +distention Neurologic: Alert and oriented x 3.  Psych: Normal affect. Musculoskeletal: No gross deformities. Extremities: No clubbing or cyanosis.   TELEMETRY: Reviewed telemetry pt in paced rhythm.  LABS: Basic Metabolic Panel:  Recent Labs  76/73/41 0306 10/06/14 0400  NA 141 140  K 5.9* 5.4*  CL 113* 112  CO2 21 20  GLUCOSE 173* 124*  BUN 61* 64*  CREATININE 2.64* 2.73*  CALCIUM 9.4 9.0   Liver Function Tests:  Recent Labs  10/04/14 0915  AST 64*  ALT 40  ALKPHOS 81  BILITOT 0.6  PROT 6.6  ALBUMIN 3.1*   No results for input(s): LIPASE, AMYLASE in the last 72 hours. CBC:  Recent Labs  10/06/14 0400  WBC 5.1  HGB 8.5*  HCT 27.4*  MCV 86.2  PLT 61*   Cardiac Enzymes:  Recent Labs  10/03/14 1600 10/04/14 0038  TROPONINI 0.23* 0.23*   BNP: Invalid input(s): POCBNP D-Dimer: No results for input(s): DDIMER in the last 72 hours. Hemoglobin A1C: No results for input(s): HGBA1C in the last 72 hours. Fasting  Lipid Panel: No results for input(s): CHOL, HDL, LDLCALC, TRIG, CHOLHDL, LDLDIRECT in the last 72 hours. Thyroid Function Tests: No results for input(s): TSH, T4TOTAL, T3FREE, THYROIDAB in the last 72 hours.  Invalid input(s): FREET3 Anemia Panel:  Recent Labs  10/06/14 0744  RETICCTPCT 1.4    RADIOLOGY: Dg Chest 2 View  10/02/2014   CLINICAL DATA:  Increase shortness of breath for 5 days. Increasing abdominal ascites and lower extremity edema. Cough, congestion for 5 days.  EXAM: CHEST  2 VIEW  COMPARISON:  08/31/2014  FINDINGS: Cardiomegaly with vascular congestion. Left pacer is unchanged. No overt edema. Bibasilar opacities are stable in likely reflect atelectasis or scarring. No effusions or acute bony abnormality.  IMPRESSION: Mild cardiomegaly and vascular congestion. Bibasilar atelectasis or scarring. No change.   Electronically Signed   By: Charlett Nose M.D.   On: 10/02/2014 11:09   US Renal  10/02/2014   CLINICAL DATA:  Urinary retention. Worsening renal function. Evaluate for obstruction.  EXAM: RENAL/URINARY TRACT ULTRASOUND COMPLETE  COMPARISON:  CT 09/04/2014.  FINDINGS: Right Kidney:  Length: 11.7 cm. Echogenicity within normal limits. No mass or hydronephrosis visualized.  Left Kidney:  Length: 10.2 cm. Echogenicity within normal limits. No mass or hydronephrosis visualized.  Bladder:  Collapse with Foley catheter.  IMPRESSION: Normal renal ultrasound.  Negative for obstruction or stones.   Electronically Signed   By: Andreas Newport M.D.   On: 10/02/2014 13:59   US Paracentesis  10/04/2014   CLINICAL DATA:  Abdominal pain, abdominal distention. Ascites. Request diagnostic paracentesis  EXAM: ULTRASOUND GUIDED PARACENTESIS  COMPARISON:  None.  PROCEDURE: An ultrasound guided paracentesis was thoroughly discussed with the patient and questions answered. The benefits, risks, alternatives and complications were also discussed. The patient understands and wishes to proceed with the  procedure. Written consent was obtained.  Ultrasound was performed to localize and mark an adequate pocket of fluid in the right lower quadrant of the abdomen. The area was then prepped and draped in the normal sterile fashion. 1% Lidocaine was used for local anesthesia. Under ultrasound guidance a 19 gauge Yueh catheter was introduced. Paracentesis was performed. The catheter was removed and a dressing applied.  COMPLICATIONS: None immediate  FINDINGS: A total of approximately 1.5  L of cloudy, amber colored fluid was removed. A fluid sample was sent for laboratory analysis.  IMPRESSION: Successful ultrasound guided paracentesis yielding 1.5 L of ascites.  Read by: Brayton El PA-C   Electronically Signed   By: Oley Balm M.D.   On: 10/04/2014 11:09      ASSESSMENT AND PLAN: 1. Acute on chronic systolic heart failure, NYHA Class 4, with massive volume overload: Lasix drip started 1/2, with 10 lbs weight loss, in addition to metolazone. Continue present therapy with Coreg. 2. Cirrhosis with portal HTN and ascites: Continue lactulose. 3. Acute Kidney Injury on background of CKD Stage 3: Stable. Nephrology following. 4. Hyperkalemia: K down to 5.4 today. No Kayexelate requirement. 5. Known LV thrombus: Continue apixaban. 6. VT s/p ICD: Continue mexiletine. No amiodarone due to elevated LFT's. 7. Hypertrophic cardiomyopathy: Continue Coreg.   Prentice Docker, M.D., F.A.C.C.

## 2014-10-06 NOTE — Progress Notes (Signed)
Admit: 10/02/2014 LOS: 4  8M with A/C sCHF, Cirrhosis w/ portal HTN and Ascites, and AoCKD with poor response to IV diuresis. Has had persistent hyperkalemia.  Subjective:  UOP picked up with lasix gtt at 15mg /hr Weight down GFR Stable  Feels well  01/02 0701 - 01/03 0700 In: 1667 [P.O.:1380; I.V.:287] Out: 2651 [Urine:2650; Stool:1]  Filed Weights   10/04/14 0520 10/05/14 0537 10/06/14 0500  Weight: 147.8 kg (325 lb 13.4 oz) 145.015 kg (319 lb 11.2 oz) 140.161 kg (309 lb)    Scheduled Meds: . apixaban  5 mg Oral BID  . carvedilol  3.125 mg Oral BID WC  . insulin aspart  0-15 Units Subcutaneous TID WC  . insulin aspart  0-5 Units Subcutaneous QHS  . insulin glargine  50 Units Subcutaneous BID  . lactulose  20 g Oral TID  . levothyroxine  187.5 mcg Oral QAC breakfast  . linagliptin  5 mg Oral Daily  . mexiletine  150 mg Oral 3 times per day  . rosuvastatin  10 mg Oral q1800  . sodium chloride  3 mL Intravenous Q12H  . tamsulosin  0.4 mg Oral Daily   Continuous Infusions: . furosemide (LASIX) infusion 15 mg/hr (10/05/14 1536)   PRN Meds:.sodium chloride, acetaminophen, ondansetron (ZOFRAN) IV, sodium chloride  Current Labs: reviewed    Physical Exam:  Blood pressure 92/45, pulse 60, temperature 97.3 F (36.3 C), temperature source Oral, resp. rate 18, height 6\' 1"  (1.854 m), weight 140.161 kg (309 lb), SpO2 98 %. GEN: NAD, obese ENT: NCAT EYES: EOMI CV: RRR PULM: faint bibasilar crackles ABD: distended and obese, +bs SKIN: no rashes/lesions, no jaundice EXT:4+ LEE  A/P 1. AoCKD 1. Has been stable in past several days 2. FOley catheter in place, Renal US 10/02/14 w/o obstruction 2. Hypervolemia; Poor Response to Bolus Diuretics 1. Did not achievie goal UF w/ TID lasix 120 mg IV + metolazone 2. Changed to lasix gtt 10/05/13 with bolus at beginning, 15mg /hr + metolazone w/ improved response 3. Cont current diuretic plan today 4. Na restriction 5. Challenging  with decompensated CHF + Cirrhosis 3. Hyperkalemia 1. ? Lingering effect of ACEi, MRB and/or ineffective renal perfusion from cirrhosis / CHF 2. Would not give Kayexlalate if K < 6.5 3. Diuretics as above 4. Low K diet 4. Chronic systolic HF, HOCM: per cardiology 5. Cirrhosis, portal HTN, Ascites:  1. recent LVP 2. On lactulose 6. DM2 7. LV thrombus on Apixaban  Sabra Heck MD 10/06/2014, 10:12 AM   Recent Labs Lab 10/04/14 0038  10/04/14 1457 10/05/14 0306 10/06/14 0400  NA 140  --   --  141 140  K 6.2*  < > 6.2* 5.9* 5.4*  CL 116*  --   --  113* 112  CO2 19  --   --  21 20  GLUCOSE 171*  --   --  173* 124*  BUN 63*  --   --  61* 64*  CREATININE 2.69*  --   --  2.64* 2.73*  CALCIUM 9.0  --   --  9.4 9.0  < > = values in this interval not displayed.  Recent Labs Lab 10/02/14 0957 10/03/14 0439 10/06/14 0400  WBC 6.0 6.2 5.1  NEUTROABS  --  4.2  --   HGB 9.0* 8.9* 8.5*  HCT 29.2* 29.5* 27.4*  MCV 85.6 84.8 86.2  PLT 75* 70* 61*

## 2014-10-06 NOTE — Progress Notes (Signed)
Patient Demographics  [name], is a 59 y.o. male, DOB - [birth year], WUJ:[id number]  Admit date - 10/02/2014   Admitting Physician Dakota Art, DO  Outpatient Primary MD for the patient is Dakota Heap, MD  LOS - 4   Chief Complaint  Patient presents with  . Shortness of Breath  . Edema      Summary  59 year old male who was recently in the hospital and discharged on 12/4 for acute on chronic systolic HF, AKI on CKD, cirrhosis. He was seen by Dr. Madilyn Holloway today who suggested he come to the ER. He was d/c'd on lasix 40 mg but that was increased to 60 mg as an outpatient. Patient has not missed any doses and his diet has not changed. Also outpatient he was taken off his amio as his cirrhosis has worsened and he was to see a liver specialist  He comes in with increased SOB that has been worsening for the last 5 days. He has noticed LE swelling and increased abdominal swelling. Unable to lay still. Has noticed he has had a harder time urinating and his urine has decreased and he feels as if his bladder is not emptying. In the ER, his Cr was found to be elevated. K was 6.4. DVT studies were negative in the ER- Baker's cyst in right popliteal fossa.    Since here he has been seen by cardiology and nephrology, his required IV Lasix drip for diuresis, he also underwent paracentesis for 1-1/2 L of ascites fluid removal, his SAAG was over 2. He will require urology and GI follow-up post discharge. Likely will stay in the hospital for the next 3-4 days for continued diuresis.    Subjective:   [name] today has, No headache, No chest pain, No abdominal pain - No Nausea, No new weakness tingling or numbness, No Cough - improved SOB.   Assessment & Plan    1. Acute on chronic respiratory  failure secondary to acute on chronic diastolic and systolic CHF. EF 25%, has AICD, and history of nonischemic cardiomyopathy.  Continue IV Lasix dose increased by cardiology and Zaroxolyn added, on Coreg, not on ACE/ARB secondary to renal failure, cardiology and renal consulted, continue salt and fluid restriction, daily weight, intake and output monitoring.   Filed Weights   10/04/14 0520 10/05/14 0537 10/06/14 0500  Weight: 147.8 kg (325 lb 13.4 oz) 145.015 kg (319 lb 11.2 oz) 140.161 kg (309 lb)      2.NSTEMI with LV clot- follow troponin trend, likely due to #1 above, cardiology consulted, chest pain free. Continue beta blocker , Eliquis for LV clot along with statin.    3. ARF with hyperkalemia on chronic kidney disease stage IV. Baseline creatinine around 2.5, hold nephrotoxins, this likely is due to CHF decompensation, improving with diuresis, hyperkalemia protocol being followed, on Kayexalate and increased dose diuretics. We will monitor potassium closely. On telemetry. Renal following.    4. DM type II. Continue Lantus and sliding scale  Lab Results  Component Value Date   HGBA1C 6.8* 08/31/2014    CBG (last 3)   Recent Labs  10/05/14 1629 10/05/14 2111 10/06/14 0647  GLUCAP 142* 144* 117*     5. Bladder outflow obstruction. Required Foley  in ER, placed on alpha blocker and will require outpatient urology follow-up.    6. Asymptomatic R Bakers Cysts - outpt Ortho follow up.    7. Ascites with Likely undiagnosed cirrhosis on previous CT scan. Has history of alcohol use in the past, pending RUQ [place] and hepatitis panel, diagnostic and therapeutic paracentesis done on 10/04/2014 with 1.5 L sciatic fluid removed, SAAG 2 pointing towards CHF and portal hypertension, will need follow up with GI outpatient post discharge. Liver enzymes, total bilirubin stable.    8. Chronic thrombocytopenia and mildly elevated INR. Likely due to undiagnosed cirrhosis.  Monitor.    9. Dyslipidemia. On statin continue.     Code Status: Full  Family Communication: none  Disposition Plan: Home 3-4 days   Procedures   RUQ [place] pending   Paracentesis - 1.5 lits removed, SAAG 2   TTE  08-2014  - Left ventricle: There is 1.5cm x 1.5cm clot in the apex. Byreport, this clot has been seen in the past. The cavity size wasmoderately to severely dilated. Wall thickness was increased in apattern of moderate to severe LVH. The estimated ejectionfraction was 25%. Diffuse hypokinesis. Doppler parameters areconsistent with high ventricular filling pressure. - Left atrium: The atrium was moderately dilated. - Right ventricle: There is some RV dysfunction. Not able to assess RV function due to poor visualization   Vascular US  - No obvious evidence of deep vein or superficial thrombosisinvolving the right lower extremity and left lower extremity.Interstitial fluid is noted in both calves. - Incidental findings are consistent with: Baker&'s Cyst on the right. - No evidence of Baker&'s cyst on the left.   Renal US   Normal renal ultrasound. Negative for obstruction or stones.     Consults  Cards, renal   Medications  Scheduled Meds: . apixaban  5 mg Oral BID  . carvedilol  6.25 mg Oral BID WC  . insulin aspart  0-15 Units Subcutaneous TID WC  . insulin aspart  0-5 Units Subcutaneous QHS  . insulin glargine  50 Units Subcutaneous BID  . lactulose  20 g Oral TID  . levothyroxine  187.5 mcg Oral QAC breakfast  . linagliptin  5 mg Oral Daily  . mexiletine  150 mg Oral 3 times per day  . rosuvastatin  10 mg Oral q1800  . sodium chloride  3 mL Intravenous Q12H  . tamsulosin  0.4 mg Oral Daily   Continuous Infusions: . furosemide (LASIX) infusion 15 mg/hr (10/05/14 1536)   PRN Meds:.sodium chloride, acetaminophen, ondansetron (ZOFRAN) IV, sodium chloride  DVT Prophylaxis Eliquis  Lab Results  Component Value Date   PLT 61*  10/06/2014    Antibiotics     Anti-infectives    None          Objective:   Filed Vitals:   10/05/14 1430 10/05/14 2000 10/06/14 0500 10/06/14 0900  BP: 103/48 117/48 106/54 92/45  Pulse: 60 67 61 60  Temp: 97.8 F (36.6 C) 98.4 F (36.9 C) 98 F (36.7 C) 97.3 F (36.3 C)  TempSrc: Oral   Oral  Resp: Height:      Weight:   140.161 kg (309 lb)   SpO2: 99% 99% 99% 98%    Wt Readings from Last 3 Encounters:  10/06/14 140.161 kg (309 lb)  09/11/14 147.873 kg (326 lb)  09/06/14 142.9 kg (315 lb 0.6 oz)     Intake/Output Summary (Last 24 hours) at 10/06/14 1005 Last data filed at 10/06/14  0923  Gross per 24 hour  Intake   1427 ml  Output   2451 ml  Net  -1024 ml     Physical Exam  Awake Alert, Oriented X 3, No new F.N deficits, Normal affect [place].AT,PERRAL Supple Neck,No JVD, No cervical lymphadenopathy appriciated.  Symmetrical Chest wall movement, Good air movement bilaterally, +ve rales RRR,No Gallops,Rubs or new Murmurs, No Parasternal Heave +ve B.Sounds, Abd distended, No tenderness, No organomegaly appriciated, No rebound - guarding or rigidity. No Cyanosis, Clubbing  No new Rash or bruise    2+ edema, Foley in place with mild hematuria  Data Review   Micro Results No results found for this or any previous visit (from the past 240 hour(s)).  Radiology Reports Dg Chest 2 View  10/02/2014   CLINICAL DATA:  Increase shortness of breath for 5 days. Increasing abdominal ascites and lower extremity edema. Cough, congestion for 5 days.  EXAM: CHEST  2 VIEW  COMPARISON:  08/31/2014  FINDINGS: Cardiomegaly with vascular congestion. Left pacer is unchanged. No overt edema. Bibasilar opacities are stable in likely reflect atelectasis or scarring. No effusions or acute bony abnormality.  IMPRESSION: Mild cardiomegaly and vascular congestion. Bibasilar atelectasis or scarring. No change.   Electronically Signed   By: Charlett Nose M.D.   On: 10/02/2014  11:09     US Renal  10/02/2014   CLINICAL DATA:  Urinary retention. Worsening renal function. Evaluate for obstruction.  EXAM: RENAL/URINARY TRACT ULTRASOUND COMPLETE  COMPARISON:  CT 09/04/2014.  FINDINGS: Right Kidney:  Length: 11.7 cm. Echogenicity within normal limits. No mass or hydronephrosis visualized.  Left Kidney:  Length: 10.2 cm. Echogenicity within normal limits. No mass or hydronephrosis visualized.  Bladder:  Collapse with Foley catheter.  IMPRESSION: Normal renal ultrasound.  Negative for obstruction or stones.   Electronically Signed   By: Andreas Newport M.D.   On: 10/02/2014 13:59     CBC  Recent Labs Lab 10/02/14 0957 10/03/14 0439 10/06/14 0400  WBC 6.0 6.2 5.1  HGB 9.0* 8.9* 8.5*  HCT 29.2* 29.5* 27.4*  PLT 75* 70* 61*  MCV 85.6 84.8 86.2  MCH 26.4 25.6* 26.7  MCHC 30.8 30.2 31.0  RDW 15.7* 15.8* 16.1*  LYMPHSABS  --  1.0  --   MONOABS  --  0.9  --   EOSABS  --  0.1  --   BASOSABS  --  0.0  --     Chemistries   Recent Labs Lab 10/02/14 0957 10/03/14 0439  10/04/14 0038 10/04/14 0730 10/04/14 0915 10/04/14 1457 10/05/14 0306 10/06/14 0400  NA 140 141  --  140  --   --   --  141 140  K 6.4* 6.7*  < > 6.2* 6.2*  --  6.2* 5.9* 5.4*  CL 114* 114*  --  116*  --   --   --  113* 112  CO2 20 19  --  19  --   --   --  21 20  GLUCOSE 133* 156*  --  171*  --   --   --  173* 124*  BUN 63* 64*  --  63*  --   --   --  61* 64*  CREATININE 3.15* 2.82*  --  2.69*  --   --   --  2.64* 2.73*  CALCIUM 8.9 9.2  --  9.0  --   --   --  9.4 9.0  AST 77*  --   --   --   --  64*  --   --   --   ALT 38  --   --   --   --  40  --   --   --   ALKPHOS 86  --   --   --   --  81  --   --   --   BILITOT 0.4  --   --   --   --  0.6  --   --   --   < > = values in this interval not displayed. ------------------------------------------------------------------------------------------------------------------ estimated creatinine clearance is 43.4 mL/min (by C-G formula based on  Cr of 2.73). ------------------------------------------------------------------------------------------------------------------ No results for input(s): HGBA1C in the last 72 hours. ------------------------------------------------------------------------------------------------------------------ No results for input(s): CHOL, HDL, LDLCALC, TRIG, CHOLHDL, LDLDIRECT in the last 72 hours. ------------------------------------------------------------------------------------------------------------------ No results for input(s): TSH, T4TOTAL, T3FREE, THYROIDAB in the last 72 hours.  Invalid input(s): FREET3 ------------------------------------------------------------------------------------------------------------------  Recent Labs  10/06/14 0744  RETICCTPCT 1.4    Coagulation profile No results for input(s): INR, PROTIME in the last 168 hours.  No results for input(s): DDIMER in the last 72 hours.  Cardiac Enzymes  Recent Labs Lab 10/03/14 0916 10/03/14 1600 10/04/14 0038  TROPONINI 0.22* 0.23* 0.23*   ------------------------------------------------------------------------------------------------------------------ Invalid input(s): POCBNP     Time Spent in minutes  35   SINGH,PRASHANT K M.D on 10/06/2014 at 10:05 AM  Between 7am to 7pm - Pager - [phone]  After 7pm go to [email or web address] - password [facility]  [facility] Office  [phone]

## 2014-10-07 DIAGNOSIS — R188 Other ascites: Secondary | ICD-10-CM | POA: Insufficient documentation

## 2014-10-07 DIAGNOSIS — Z7901 Long term (current) use of anticoagulants: Secondary | ICD-10-CM

## 2014-10-07 DIAGNOSIS — N189 Chronic kidney disease, unspecified: Secondary | ICD-10-CM

## 2014-10-07 LAB — BASIC METABOLIC PANEL
ANION GAP: 8 (ref 5–15)
BUN: 67 mg/dL — ABNORMAL HIGH (ref 6–23)
CHLORIDE: 109 meq/L (ref 96–112)
CO2: 23 mmol/L (ref 19–32)
Calcium: 9.1 mg/dL (ref 8.4–10.5)
Creatinine, Ser: 2.89 mg/dL — ABNORMAL HIGH (ref 0.50–1.35)
GFR calc non Af Amer: 22 mL/min — ABNORMAL LOW (ref >90)
GFR, EST AFRICAN AMERICAN: 26 mL/min — AB (ref >90)
Glucose, Bld: 120 mg/dL — ABNORMAL HIGH (ref 70–99)
POTASSIUM: 5 mmol/L (ref 3.5–5.1)
SODIUM: 140 mmol/L (ref 135–145)

## 2014-10-07 LAB — GLUCOSE, CAPILLARY
GLUCOSE-CAPILLARY: 188 mg/dL — AB (ref 70–99)
GLUCOSE-CAPILLARY: 145 mg/dL — AB (ref 70–99)
GLUCOSE-CAPILLARY: 198 mg/dL — AB (ref 70–99)
Glucose-Capillary: 109 mg/dL — ABNORMAL HIGH (ref 70–99)

## 2014-10-07 LAB — HEPATIC FUNCTION PANEL
ALT: 58 U/L — ABNORMAL HIGH (ref 0–53)
AST: 87 U/L — ABNORMAL HIGH (ref 0–37)
Albumin: 3 g/dL — ABNORMAL LOW (ref 3.5–5.2)
Alkaline Phosphatase: 101 U/L (ref 39–117)
BILIRUBIN INDIRECT: 0.3 mg/dL (ref 0.3–0.9)
Bilirubin, Direct: 0.1 mg/dL (ref 0.0–0.3)
TOTAL PROTEIN: 6.5 g/dL (ref 6.0–8.3)
Total Bilirubin: 0.4 mg/dL (ref 0.3–1.2)

## 2014-10-07 LAB — CBC
HCT: 27.3 % — ABNORMAL LOW (ref 39.0–52.0)
Hemoglobin: 8.4 g/dL — ABNORMAL LOW (ref 13.0–17.0)
MCH: 25.8 pg — ABNORMAL LOW (ref 26.0–34.0)
MCHC: 30.8 g/dL (ref 30.0–36.0)
MCV: 83.7 fL (ref 78.0–100.0)
PLATELETS: 69 10*3/uL — AB (ref 150–400)
RBC: 3.26 MIL/uL — AB (ref 4.22–5.81)
RDW: 16.2 % — ABNORMAL HIGH (ref 11.5–15.5)
WBC: 5.3 10*3/uL (ref 4.0–10.5)

## 2014-10-07 NOTE — Progress Notes (Signed)
Patient Demographics  [name], is a 59 y.o. male, DOB - [birth year], ZOX:[id number]  Admit date - 10/02/2014   Admitting Physician Joseph Art, DO  Outpatient Primary MD for the patient is [name], MD  LOS - 5   Chief Complaint  Patient presents with  . Shortness of Breath  . Edema      Summary  59 year old male who was recently in the hospital and discharged on 12/4 for acute on chronic systolic HF, AKI on CKD, cirrhosis. He was seen by Dr. Madilyn Fireman today who suggested he come to the ER. He was d/c'd on lasix 40 mg but that was increased to 60 mg as an outpatient. Patient has not missed any doses and his diet has not changed. Also outpatient he was taken off his amio as his cirrhosis has worsened and he was to see a liver specialist  He comes in with increased SOB that has been worsening for the last 5 days. He has noticed LE swelling and increased abdominal swelling. Unable to lay still. Has noticed he has had a harder time urinating and his urine has decreased and he feels as if his bladder is not emptying. In the ER, his Cr was found to be elevated. K was 6.4. DVT studies were negative in the ER- Baker's cyst in right popliteal fossa.    Since here he has been seen by cardiology and nephrology, his required IV Lasix drip for diuresis, he also underwent paracentesis for 1-1/2 L of ascites fluid removal, his SAAG was over 2. He will require urology and GI follow-up post discharge. Likely will stay in the hospital for the next 3-4 days for continued diuresis.    Subjective:   [name] today has, No headache, No chest pain, No abdominal pain - No Nausea, No new weakness tingling or numbness, No Cough - improved SOB.   Assessment & Plan    1. Acute on chronic respiratory  failure secondary to acute on chronic diastolic and systolic CHF. EF 25%, has AICD, and history of nonischemic cardiomyopathy. Remains volume overloaded. Cardiology following. On Lasix gtt, has net negative fluid balance of 3.129 L.     Filed Weights   10/05/14 0537 10/06/14 0500 10/07/14 0433  Weight: 145.015 kg (319 lb 11.2 oz) 140.161 kg (309 lb) 142.1 kg (313 lb 4.4 oz)    2.NSTEMI with LV clot- follow troponin trend, likely due to #1 above, cardiology consulted, chest pain free. Continue beta blocker , Eliquis for LV clot along with statin.    3. ARF with hyperkalemia on chronic kidney disease stage IV. Baseline creatinine around 2.5, hold nephrotoxins, this likely is due to CHF decompensation, improving with diuresis, hyperkalemia protocol being followed, on Kayexalate and increased dose diuretics. We will monitor potassium closely. On telemetry. Renal following.    4. DM type II. Continue Lantus and sliding scale  Lab Results  Component Value Date   HGBA1C 6.8* 08/31/2014    CBG (last 3)   Recent Labs  10/07/14 0614 10/07/14 1109 10/07/14 1627  GLUCAP 109* 188* 145*     5. Bladder outflow obstruction. Required Foley in ER, placed on alpha blocker and will require outpatient urology follow-up.    6. Asymptomatic R Bakers  Cysts - outpt Ortho follow up.    7. Ascites with Likely undiagnosed cirrhosis on previous CT scan. Has history of alcohol use in the past, pending RUQ [place] and hepatitis panel, diagnostic and therapeutic paracentesis done on 10/04/2014 with 1.5 L sciatic fluid removed, SAAG 2 pointing towards CHF and portal hypertension, will need follow up with GI outpatient post discharge. Liver enzymes, total bilirubin stable.    8. Chronic thrombocytopenia and mildly elevated INR. Likely due to undiagnosed cirrhosis. Monitor.    9. Dyslipidemia. On statin continue.     Code Status: Full  Family Communication: none  Disposition Plan: Home 3-4  days   Procedures   RUQ [place] pending   Paracentesis - 1.5 lits removed, SAAG 2   TTE  08-2014  - Left ventricle: There is 1.5cm x 1.5cm clot in the apex. Byreport, this clot has been seen in the past. The cavity size wasmoderately to severely dilated. Wall thickness was increased in apattern of moderate to severe LVH. The estimated ejectionfraction was 25%. Diffuse hypokinesis. Doppler parameters areconsistent with high ventricular filling pressure. - Left atrium: The atrium was moderately dilated. - Right ventricle: There is some RV dysfunction. Not able to assess RV function due to poor visualization   Vascular US  - No obvious evidence of deep vein or superficial thrombosisinvolving the right lower extremity and left lower extremity.Interstitial fluid is noted in both calves. - Incidental findings are consistent with: Baker&'s Cyst on the right. - No evidence of Baker&'s cyst on the left.   Renal US   Normal renal ultrasound. Negative for obstruction or stones.     Consults  Cards, renal   Medications  Scheduled Meds: . apixaban  5 mg Oral BID  . insulin aspart  0-15 Units Subcutaneous TID WC  . insulin aspart  0-5 Units Subcutaneous QHS  . insulin glargine  50 Units Subcutaneous BID  . lactulose  20 g Oral TID  . levothyroxine  187.5 mcg Oral QAC breakfast  . linagliptin  5 mg Oral Daily  . mexiletine  150 mg Oral 3 times per day  . rosuvastatin  10 mg Oral q1800  . sodium chloride  3 mL Intravenous Q12H  . tamsulosin  0.4 mg Oral Daily   Continuous Infusions: . furosemide (LASIX) infusion 15 mg/hr (10/07/14 1034)   PRN Meds:.sodium chloride, acetaminophen, ondansetron (ZOFRAN) IV, sodium chloride  DVT Prophylaxis Eliquis  Lab Results  Component Value Date   PLT 69* 10/07/2014    Antibiotics     Anti-infectives    None          Objective:   Filed Vitals:   10/06/14 1400 10/06/14 1941 10/07/14 0433 10/07/14 1425  BP: 107/46  112/96 123/51 124/61  Pulse: 66 78 73 61  Temp: 97.8 F (36.6 C) 97.8 F (36.6 C) 98.3 F (36.8 C) 98 F (36.7 C)  TempSrc: Oral Oral Oral Oral  Resp: 18 18 18 18   Height:      Weight:   142.1 kg (313 lb 4.4 oz)   SpO2: 99% 100% 98% 97%    Wt Readings from Last 3 Encounters:  10/07/14 142.1 kg (313 lb 4.4 oz)  09/11/14 147.873 kg (326 lb)  09/06/14 142.9 kg (315 lb 0.6 oz)     Intake/Output Summary (Last 24 hours) at 10/07/14 1843 Last data filed at 10/07/14 1745  Gross per 24 hour  Intake   1734 ml  Output   1903 ml  Net   -169 ml  Physical Exam  Awake Alert, Oriented X 3, No new F.N deficits, Normal affect [place].AT,PERRAL Supple Neck,No JVD, No cervical lymphadenopathy appriciated.  Symmetrical Chest wall movement, Good air movement bilaterally, +ve rales RRR,No Gallops,Rubs or new Murmurs, No Parasternal Heave +ve B.Sounds, Abd distended, No tenderness, No organomegaly appriciated, No rebound - guarding or rigidity. No Cyanosis, Clubbing  No new Rash or bruise    2+ edema, Foley in place with mild hematuria  Data Review   Micro Results Recent Results (from the past 240 hour(s))  Body fluid culture     Status: None (Preliminary result)   Collection Time: 10/04/14 10:12 AM  Result Value Ref Range Status   Specimen Description ASCITIC  Final   Special Requests NONE  Final   Gram Stain   Final    RARE WBC PRESENT, PREDOMINANTLY MONONUCLEAR NO ORGANISMS SEEN Performed at [facility]    Culture   Final    NO GROWTH 3 DAYS Performed at [facility]    Report Status PENDING  Incomplete    Radiology Reports Dg Chest 2 View  10/02/2014   CLINICAL DATA:  Increase shortness of breath for 5 days. Increasing abdominal ascites and lower extremity edema. Cough, congestion for 5 days.  EXAM: CHEST  2 VIEW  COMPARISON:  08/31/2014  FINDINGS: Cardiomegaly with vascular congestion. Left pacer is unchanged. No overt edema. Bibasilar opacities are stable  in likely reflect atelectasis or scarring. No effusions or acute bony abnormality.  IMPRESSION: Mild cardiomegaly and vascular congestion. Bibasilar atelectasis or scarring. No change.   Electronically Signed   By: Charlett Nose M.D.   On: 10/02/2014 11:09     US Renal  10/02/2014   CLINICAL DATA:  Urinary retention. Worsening renal function. Evaluate for obstruction.  EXAM: RENAL/URINARY TRACT ULTRASOUND COMPLETE  COMPARISON:  CT 09/04/2014.  FINDINGS: Right Kidney:  Length: 11.7 cm. Echogenicity within normal limits. No mass or hydronephrosis visualized.  Left Kidney:  Length: 10.2 cm. Echogenicity within normal limits. No mass or hydronephrosis visualized.  Bladder:  Collapse with Foley catheter.  IMPRESSION: Normal renal ultrasound.  Negative for obstruction or stones.   Electronically Signed   By: Andreas Newport M.D.   On: 10/02/2014 13:59     CBC  Recent Labs Lab 10/02/14 0957 10/03/14 0439 10/06/14 0400 10/07/14 0400  WBC 6.0 6.2 5.1 5.3  HGB 9.0* 8.9* 8.5* 8.4*  HCT 29.2* 29.5* 27.4* 27.3*  PLT 75* 70* 61* 69*  MCV 85.6 84.8 86.2 83.7  MCH 26.4 25.6* 26.7 25.8*  MCHC 30.8 30.2 31.0 30.8  RDW 15.7* 15.8* 16.1* 16.2*  LYMPHSABS  --  1.0  --   --   MONOABS  --  0.9  --   --   EOSABS  --  0.1  --   --   BASOSABS  --  0.0  --   --     Chemistries   Recent Labs Lab 10/02/14 0957 10/03/14 0439  10/04/14 0038 10/04/14 0730 10/04/14 0915 10/04/14 1457 10/05/14 0306 10/06/14 0400 10/07/14 0400  NA 140 141  --  140  --   --   --  141 140 140  K 6.4* 6.7*  < > 6.2* 6.2*  --  6.2* 5.9* 5.4* 5.0  CL 114* 114*  --  116*  --   --   --  113* 112 109  CO2 20 19  --  19  --   --   --  21 20 23  GLUCOSE 133* 156*  --  171*  --   --   --  173* 124* 120*  BUN 63* 64*  --  63*  --   --   --  61* 64* 67*  CREATININE 3.15* 2.82*  --  2.69*  --   --   --  2.64* 2.73* 2.89*  CALCIUM 8.9 9.2  --  9.0  --   --   --  9.4 9.0 9.1  AST 77*  --   --   --   --  64*  --   --   --  87*  ALT  38  --   --   --   --  40  --   --   --  58*  ALKPHOS 86  --   --   --   --  81  --   --   --  101  BILITOT 0.4  --   --   --   --  0.6  --   --   --  0.4  < > = values in this interval not displayed. ------------------------------------------------------------------------------------------------------------------ estimated creatinine clearance is 41.3 mL/min (by C-G formula based on Cr of 2.89). ------------------------------------------------------------------------------------------------------------------ No results for input(s): HGBA1C in the last 72 hours. ------------------------------------------------------------------------------------------------------------------ No results for input(s): CHOL, HDL, LDLCALC, TRIG, CHOLHDL, LDLDIRECT in the last 72 hours. ------------------------------------------------------------------------------------------------------------------ No results for input(s): TSH, T4TOTAL, T3FREE, THYROIDAB in the last 72 hours.  Invalid input(s): FREET3 ------------------------------------------------------------------------------------------------------------------  Recent Labs  10/06/14 0744  VITAMINB12 390  FOLATE 7.7  FERRITIN 154  TIBC 352  IRON 37*  RETICCTPCT 1.4    Coagulation profile No results for input(s): INR, PROTIME in the last 168 hours.  No results for input(s): DDIMER in the last 72 hours.  Cardiac Enzymes  Recent Labs Lab 10/03/14 0916 10/03/14 1600 10/04/14 0038  TROPONINI 0.22* 0.23* 0.23*   ------------------------------------------------------------------------------------------------------------------ Invalid input(s): POCBNP     Time Spent in minutes  25   Jeralyn Bennett M.D on 10/07/2014 at 6:43 PM  Between 7am to 7pm - Pager - [phone]  After 7pm go to [email or web address] - password [facility]  [facility] Office  [phone]

## 2014-10-07 NOTE — Progress Notes (Signed)
Subjective:  SOB with any exertion. C/O dry cough  Objective:  Vital Signs in the last 24 hours: Temp:  [97.3 F (36.3 C)-98.3 F (36.8 C)] 98.3 F (36.8 C) (01/04 0433) Pulse Rate:  [60-78] 73 (01/04 0433) Resp:  [18] 18 (01/04 0433) BP: (92-123)/(45-96) 123/51 mmHg (01/04 0433) SpO2:  [98 %-100 %] 98 % (01/04 0433) Weight:  [313 lb 4.4 oz (142.1 kg)] 313 lb 4.4 oz (142.1 kg) (01/04 0433)  Intake/Output from previous day:  Intake/Output Summary (Last 24 hours) at 10/07/14 0829 Last data filed at 10/07/14 0440  Gross per 24 hour  Intake   1048 ml  Output   2400 ml  Net  -1352 ml    Physical Exam: General appearance: alert, cooperative, no distress and morbidly obese Lungs: clear to auscultation bilaterally Heart: regular rate and rhythm and 2/6 systolic murmur Extremities: 2+ edema   Rate: 74  Rhythm: normal sinus rhythm  Lab Results:  Recent Labs  10/06/14 0400 10/07/14 0400  WBC 5.1 5.3  HGB 8.5* 8.4*  PLT 61* 69*    Recent Labs  10/06/14 0400 10/07/14 0400  NA 140 140  K 5.4* 5.0  CL 112 109  CO2 20 23  GLUCOSE 124* 120*  BUN 64* 67*  CREATININE 2.73* 2.89*   No results for input(s): TROPONINI in the last 72 hours.  Invalid input(s): CK, MB No results for input(s): INR in the last 72 hours.  Imaging: Imaging results have been reviewed  Cardiac Studies: Echo 09/01/14 Study Conclusions  - Left ventricle: There is 1.5cm x 1.5cm clot in the apex. By report, this clot has been seen in the past. The cavity size was moderately to severely dilated. Wall thickness was increased in a pattern of moderate to severe LVH. The estimated ejection fraction was 25%. Diffuse hypokinesis. Doppler parameters are consistent with high ventricular filling pressure. - Left atrium: The atrium was moderately dilated. - Right ventricle: There is some RV dysfunction. Not able to assess RV function due to poor visualization. Not able to assess RV  size due to poor visualization.   Assessment/Plan:  59 y/o male followed by Dr [name] and Dr Antoine Poche with a history of hypertrophic cardiomyopathy, chronic systolic heart failure, DM, NICM, LVT-on Eliquis, complete heart block, and ventricular tachycardia. He has been on amiodarone and mexiletine and had an upgrade to a CRT-D in the past. His Amiodarone was stopped secondary to elevated LFTs as an OP. He was admitted with acute on chronic CHF and acute on chronic renal insufficiency 09/22/14. Adm wgt was 325. He is diuresing on IV Lasix and Zaroxolyn. He had ascites by [place] and had a paracentesis -1.5L- on 10/04/14.   Principal Problem:   Acute on chronic combined systolic and diastolic congestive heart failure Active Problems:   Acute on chronic renal insufficiency   Type 2 diabetes mellitus treated with insulin   HTN (hypertension)   Hypertrophic obstructive cardiomyopathy   Hyperkalemia   Hyperlipidemia   Obesity   Automatic implantable cardioverter-defibrillator in situ   Ventricular tachycardia   Mural thrombus of left ventricle   Anemia   Chronic anticoagulation   PLAN: Continue to diurese. Slight bump in SCr today, renal following  Corine Shelter PA-C Beeper [phone] 10/07/2014, 8:29 AM  As above, patient seen and examined. Clinically he is improving. He remains markedly volume overloaded. Continue Lasix and follow renal function. Nephrology is following. He does have a cardiomyopathy. He is on very low-dose carvedilol which I will  discontinue in the setting of acute heart failure. No ACE inhibitor given severity of renal insufficiency. He has not had any significant arrhythmias on telemetry. Continue mexiletine. Amiodarone has been discontinued as it is felt that this is contributing to cirrhosis. Continue anticoagulation for history of LV thrombus. Olga Millers

## 2014-10-07 NOTE — Progress Notes (Signed)
Patient ID: Dakota Brook Sr., male   DOB: 1956-02-23, 59 y.o.   MRN: [id number]  [facility] Progress Note    Assessment/ Plan:   1. Acute renal failure on chronic kidney disease: Fair urine output in response to diuretic therapy, renal function and electrolytes stable without any acute dialysis needs. May be appropriate to convert him to bolus doses of furosemide in the next 48 hours (currently on 360 mg per day). Hyperkalemia improving possibly because of diuresis. 2. Volume overload: Converted to intravenous Lasix drip together with metolazone after failure to respond to bolus doses of diuretics-continue to monitor, appears to be improving clinically although weight is up by 2 kg overnight 3. Cirrhosis/portal hypertension/ascites: Recently underwent low-volume paracentesis and remains on lactulose 4. LV thrombus: Remains on Apixiban 5. Chronic systolic heart failure: Ongoing diuretic therapy-further management per cardiology  Subjective:   Reports to be feeling well-abdominal girth notably better but still has pedal edema. Respiratory status improved.    Objective:   BP 123/51 mmHg  Pulse 73  Temp(Src) 98.3 F (36.8 C) (Oral)  Resp 18  Ht  (1.854 m)  Wt 142.1 kg (313 lb 4.4 oz)  BMI 41.34 kg/m2  SpO2 98%  Intake/Output Summary (Last 24 hours) at 10/07/14 1008 Last data filed at 10/07/14 0440  Gross per 24 hour  Intake    748 ml  Output   2000 ml  Net  -1252 ml   Weight change: 1.939 kg (4 lb 4.4 oz)  Physical Exam: Gen: Comfortably resting in bed, watching television CVS: Pulse regular in rate and rhythm, S1 and S2 normal Resp: Diminished breath sounds at the bases with fine rales Abd: Soft, obese, nontender Ext: 2-3+ lower extremity edema  Imaging: US Abdomen Limited Ruq  10/06/2014   CLINICAL DATA:  Initial evaluation for abdominal pain, personal history of cirrhosis  EXAM: US ABDOMEN LIMITED - RIGHT UPPER QUADRANT  COMPARISON: *06/11/2013   FINDINGS: Gallbladder:  As on the prior study the gallbladder wall is about 4 mm in thickness. There is no sonographic Murphy's sign. There are echogenic foci adherent to the wall of the gallbladder measuring up to 6 mm, which are not mobile.  Common bile duct:  Diameter: 4 mm  Liver:  Heterogeneous echotexture with nodular contour consistent with cirrhosis. There is trace fluid around the liver.  IMPRESSION: Evidence of cirrhosis, with nonspecific stable mild gallbladder wall thickening and no Murphy's sign. New from the prior study is the presence of several small echogenic foci adherent to the gallbladder wall, possibly representing complex sludge or small immobile calculi.   Electronically Signed   By: Esperanza Heir M.D.   On: 10/06/2014 16:09    Labs: BMET  Recent Labs Lab 10/02/14 0957 10/03/14 0439  10/03/14 1600 10/04/14 0038 10/04/14 0730 10/04/14 1457 10/05/14 0306 10/06/14 0400 10/07/14 0400  NA 140 141  --   --  140  --   --  141 140 140  K 6.4* 6.7*  < > 6.2* 6.2* 6.2* 6.2* 5.9* 5.4* 5.0  CL 114* 114*  --   --  116*  --   --  113* 112 109  CO2 20 19  --   --  19  --   --  GLUCOSE 133* 156*  --   --  171*  --   --  173* 124* 120*  BUN 63* 64*  --   --  63*  --   --  61* 64* 67*  CREATININE 3.15* 2.82*  --   --  2.69*  --   --  2.64* 2.73* 2.89*  CALCIUM 8.9 9.2  --   --  9.0  --   --  9.4 9.0 9.1  < > = values in this interval not displayed. CBC  Recent Labs Lab 10/02/14 0957 10/03/14 0439 10/06/14 0400 10/07/14 0400  WBC 6.0 6.2 5.1 5.3  NEUTROABS  --  4.2  --   --   HGB 9.0* 8.9* 8.5* 8.4*  HCT 29.2* 29.5* 27.4* 27.3*  MCV 85.6 84.8 86.2 83.7  PLT 75* 70* 61* 69*    Medications:    . apixaban  5 mg Oral BID  . carvedilol  3.125 mg Oral BID WC  . insulin aspart  0-15 Units Subcutaneous TID WC  . insulin aspart  0-5 Units Subcutaneous QHS  . insulin glargine  50 Units Subcutaneous BID  . lactulose  20 g Oral TID  . levothyroxine  187.5 mcg  Oral QAC breakfast  . linagliptin  5 mg Oral Daily  . mexiletine  150 mg Oral 3 times per day  . rosuvastatin  10 mg Oral q1800  . sodium chloride  3 mL Intravenous Q12H  . tamsulosin  0.4 mg Oral Daily      Zetta Bills, MD 10/07/2014, 10:08 AM

## 2014-10-08 DIAGNOSIS — K7469 Other cirrhosis of liver: Secondary | ICD-10-CM

## 2014-10-08 DIAGNOSIS — K7460 Unspecified cirrhosis of liver: Secondary | ICD-10-CM | POA: Insufficient documentation

## 2014-10-08 LAB — BASIC METABOLIC PANEL
ANION GAP: 10 (ref 5–15)
BUN: 69 mg/dL — ABNORMAL HIGH (ref 6–23)
CO2: 22 mmol/L (ref 19–32)
CREATININE: 2.74 mg/dL — AB (ref 0.50–1.35)
Calcium: 9 mg/dL (ref 8.4–10.5)
Chloride: 109 mEq/L (ref 96–112)
GFR calc Af Amer: 28 mL/min — ABNORMAL LOW (ref >90)
GFR calc non Af Amer: 24 mL/min — ABNORMAL LOW (ref >90)
Glucose, Bld: 161 mg/dL — ABNORMAL HIGH (ref 70–99)
POTASSIUM: 5 mmol/L (ref 3.5–5.1)
Sodium: 141 mmol/L (ref 135–145)

## 2014-10-08 LAB — BODY FLUID CULTURE: Culture: NO GROWTH

## 2014-10-08 LAB — GLUCOSE, CAPILLARY
GLUCOSE-CAPILLARY: 188 mg/dL — AB (ref 70–99)
Glucose-Capillary: 150 mg/dL — ABNORMAL HIGH (ref 70–99)
Glucose-Capillary: 225 mg/dL — ABNORMAL HIGH (ref 70–99)
Glucose-Capillary: 200 mg/dL — ABNORMAL HIGH (ref 70–99)

## 2014-10-08 MED ORDER — CARVEDILOL 3.125 MG PO TABS
3.1250 mg | ORAL_TABLET | Freq: Two times a day (BID) | ORAL | Status: DC
  Administered 2014-10-08 – 2014-10-10 (×5): 3.125 mg via ORAL
  Filled 2014-10-08 (×8): qty 1

## 2014-10-08 MED ORDER — HYDRALAZINE HCL 10 MG PO TABS
10.0000 mg | ORAL_TABLET | Freq: Three times a day (TID) | ORAL | Status: DC
  Administered 2014-10-08 – 2014-10-11 (×9): 10 mg via ORAL
  Filled 2014-10-08 (×12): qty 1

## 2014-10-08 MED ORDER — DEXTROMETHORPHAN POLISTIREX 30 MG/5ML PO LQCR
30.0000 mg | Freq: Two times a day (BID) | ORAL | Status: AC
  Administered 2014-10-08 – 2014-10-10 (×6): 30 mg via ORAL
  Filled 2014-10-08 (×6): qty 5

## 2014-10-08 MED ORDER — FUROSEMIDE 10 MG/ML IJ SOLN
120.0000 mg | Freq: Three times a day (TID) | INTRAVENOUS | Status: DC
  Administered 2014-10-08 – 2014-10-11 (×9): 120 mg via INTRAVENOUS
  Filled 2014-10-08 (×11): qty 12

## 2014-10-08 NOTE — Progress Notes (Signed)
Patient Name: Dakota ALVIZO Sr. Date of Encounter: 10/08/2014     Principal Problem:   Acute on chronic combined systolic and diastolic congestive heart failure Active Problems:   Type 2 diabetes mellitus treated with insulin   Hyperlipidemia   Obesity   HTN (hypertension)   Hypertrophic obstructive cardiomyopathy   Automatic implantable cardioverter-defibrillator in situ   Ventricular tachycardia   Acute on chronic renal insufficiency   Hyperkalemia   Mural thrombus of left ventricle   Anemia   Chronic anticoagulation   Ascites    SUBJECTIVE  Denies any SOB or CP. Some cough and runny nose since last night  CURRENT MEDS . apixaban  5 mg Oral BID  . insulin aspart  0-15 Units Subcutaneous TID WC  . insulin aspart  0-5 Units Subcutaneous QHS  . insulin glargine  50 Units Subcutaneous BID  . lactulose  20 g Oral TID  . levothyroxine  187.5 mcg Oral QAC breakfast  . linagliptin  5 mg Oral Daily  . mexiletine  150 mg Oral 3 times per day  . rosuvastatin  10 mg Oral q1800  . sodium chloride  3 mL Intravenous Q12H  . tamsulosin  0.4 mg Oral Daily    OBJECTIVE  Filed Vitals:   10/07/14 0433 10/07/14 1425 10/07/14 2027 10/08/14 0521  BP: 123/51 124/61 116/63 134/53  Pulse: 73 61 81 73  Temp: 98.3 F (36.8 C) 98 F (36.7 C) 97.1 F (36.2 C) 98.2 F (36.8 C)  TempSrc: Oral Oral Oral Oral  Resp: 18 18 18 20   Height:      Weight: 313 lb 4.4 oz (142.1 kg)   308 lb 4.8 oz (139.844 kg)  SpO2: 98% 97% 100% 97%    Intake/Output Summary (Last 24 hours) at 10/08/14 0942 Last data filed at 10/08/14 0606  Gross per 24 hour  Intake   1284 ml  Output   3603 ml  Net  -2319 ml   Filed Weights   10/06/14 0500 10/07/14 0433 10/08/14 0521  Weight: 309 lb (140.161 kg) 313 lb 4.4 oz (142.1 kg) 308 lb 4.8 oz (139.844 kg)    PHYSICAL EXAM  General: Pleasant, NAD. Neuro: Alert and oriented X 3. Moves all extremities spontaneously. Psych: Normal affect. HEENT:   Normal  Neck: Supple without bruits or JVD. Lungs:  Resp regular and unlabored, CTA. Heart: RRR no s3, s4, or murmurs. Abdomen: Soft, non-tender, non-distended, BS + x 4.  Extremities: No clubbing, cyanosis. DP/PT/Radials 2+ and equal bilaterally. 2+ bilateral LE edema  Accessory Clinical Findings  CBC  Recent Labs  10/06/14 0400 10/07/14 0400  WBC 5.1 5.3  HGB 8.5* 8.4*  HCT 27.4* 27.3*  MCV 86.2 83.7  PLT 61* 69*   Basic Metabolic Panel  Recent Labs  10/07/14 0400 10/08/14 0416  NA 140 141  K 5.0 5.0  CL 109 109  CO2 23 22  GLUCOSE 120* 161*  BUN 67* 69*  CREATININE 2.89* 2.74*  CALCIUM 9.1 9.0   Liver Function Tests  Recent Labs  10/07/14 0400  AST 87*  ALT 58*  ALKPHOS 101  BILITOT 0.4  PROT 6.5  ALBUMIN 3.0*    TELE Not on telemetry    ECG  No new EKG  Echocardiogram  LV EF: 25%  ------------------------------------------------------------------- Indications:   CHF - 428.0.  ------------------------------------------------------------------- Study Conclusions  - Left ventricle: There is 1.5cm x 1.5cm clot in the apex. By report, this clot has been seen in the past. The  cavity size was moderately to severely dilated. Wall thickness was increased in a pattern of moderate to severe LVH. The estimated ejection fraction was 25%. Diffuse hypokinesis. Doppler parameters are consistent with high ventricular filling pressure. - Left atrium: The atrium was moderately dilated. - Right ventricle: There is some RV dysfunction. Not able to assess RV function due to poor visualization. Not able to assess RV size due to poor visualization.     Radiology/Studies  Dg Chest 2 View  10/02/2014   CLINICAL DATA:  Increase shortness of breath for 5 days. Increasing abdominal ascites and lower extremity edema. Cough, congestion for 5 days.  EXAM: CHEST  2 VIEW  COMPARISON:  08/31/2014  FINDINGS: Cardiomegaly with vascular congestion.  Left pacer is unchanged. No overt edema. Bibasilar opacities are stable in likely reflect atelectasis or scarring. No effusions or acute bony abnormality.  IMPRESSION: Mild cardiomegaly and vascular congestion. Bibasilar atelectasis or scarring. No change.   Electronically Signed   By: Charlett Nose M.D.   On: 10/02/2014 11:09   US Renal  10/02/2014   CLINICAL DATA:  Urinary retention. Worsening renal function. Evaluate for obstruction.  EXAM: RENAL/URINARY TRACT ULTRASOUND COMPLETE  COMPARISON:  CT 09/04/2014.  FINDINGS: Right Kidney:  Length: 11.7 cm. Echogenicity within normal limits. No mass or hydronephrosis visualized.  Left Kidney:  Length: 10.2 cm. Echogenicity within normal limits. No mass or hydronephrosis visualized.  Bladder:  Collapse with Foley catheter.  IMPRESSION: Normal renal ultrasound.  Negative for obstruction or stones.   Electronically Signed   By: Andreas Newport M.D.   On: 10/02/2014 13:59   US Paracentesis  10/04/2014   CLINICAL DATA:  Abdominal pain, abdominal distention. Ascites. Request diagnostic paracentesis  EXAM: ULTRASOUND GUIDED PARACENTESIS  COMPARISON:  None.  PROCEDURE: An ultrasound guided paracentesis was thoroughly discussed with the patient and questions answered. The benefits, risks, alternatives and complications were also discussed. The patient understands and wishes to proceed with the procedure. Written consent was obtained.  Ultrasound was performed to localize and mark an adequate pocket of fluid in the right lower quadrant of the abdomen. The area was then prepped and draped in the normal sterile fashion. 1% Lidocaine was used for local anesthesia. Under ultrasound guidance a 19 gauge Yueh catheter was introduced. Paracentesis was performed. The catheter was removed and a dressing applied.  COMPLICATIONS: None immediate  FINDINGS: A total of approximately 1.5 L of cloudy, amber colored fluid was removed. A fluid sample was sent for laboratory analysis.   IMPRESSION: Successful ultrasound guided paracentesis yielding 1.5 L of ascites.  Read by: Brayton El PA-C   Electronically Signed   By: Oley Balm M.D.   On: 10/04/2014 11:09   US Abdomen Limited Ruq  10/06/2014   CLINICAL DATA:  Initial evaluation for abdominal pain, personal history of cirrhosis  EXAM: US ABDOMEN LIMITED - RIGHT UPPER QUADRANT  COMPARISON: *06/11/2013  FINDINGS: Gallbladder:  As on the prior study the gallbladder wall is about 4 mm in thickness. There is no sonographic Murphy's sign. There are echogenic foci adherent to the wall of the gallbladder measuring up to 6 mm, which are not mobile.  Common bile duct:  Diameter: 4 mm  Liver:  Heterogeneous echotexture with nodular contour consistent with cirrhosis. There is trace fluid around the liver.  IMPRESSION: Evidence of cirrhosis, with nonspecific stable mild gallbladder wall thickening and no Murphy's sign. New from the prior study is the presence of several small echogenic foci adherent to the gallbladder wall,  possibly representing complex sludge or small immobile calculi.   Electronically Signed   By: Esperanza Heir M.D.   On: 10/06/2014 16:09    ASSESSMENT AND PLAN  1. Acute on chronic combined systolic and diastolic CHF  - off coreg given acute heart failure  - weight down from 325 to 308, Net -5.4 L  - Lung essentially clear, however LE edema persistent, will need more IV lasix, per nephrology, can potentially transition to bolus IV lasix today or tomorrow  2. Cirrhosis with portal HTN and ascites: off amiodarone 3. Acute on chronic renal insufficiency 4. Hyperkalemia 5. Known LV thrombus: on apixaban 6. VT s/p ICD: on mexiletine 7. Hypertrophic cardiomyopathy  Signed, Azalee Course PA-C Pager: [phone] As above, patient seen and examined. Symptomatically he is improving. Volume status better but still needs diuresis. Change Lasix drip to 120 mg IV 3 times a day. Follow renal function closely. Beta blocker is on  hold because of acute heart failure. We'll resume 3.125 mg twice a day. We will avoid ACE inhibitor given severity of renal insufficiency. Add low-dose hydralazine today and nitrates tomorrow if blood pressure allows. Olga Millers

## 2014-10-08 NOTE — Progress Notes (Signed)
Patient ID: Dakota Brook Sr., male   DOB: 1956/01/13, 59 y.o.   MRN: [id number]  [facility] Progress Note    Assessment/ Plan:   1. Acute renal failure on chronic kidney disease: Good urine output overnight, stable electrolytes and without any acute dialysis needs. Would recommend conversion to bolus furosemide 120 mg 3 times a day so that we can gradually transitioning to oral therapy. 2. Volume overload/CHF decompensation: Responding well to diuretic therapy, continue to monitor for possible conversion to bolus/oral dosing  3. Cirrhosis/portal hypertension/ascites: Recently underwent low-volume paracentesis and remains on lactulose 4. LV thrombus: Remains on Apixiban 5. Chronic systolic heart failure: Ongoing diuretic therapy-further management per cardiology  Subjective:   Reports to be feeling better-denies any chest pain or shortness of breath    Objective:   BP 134/53 mmHg  Pulse 73  Temp(Src) 98.2 F (36.8 C) (Oral)  Resp 20  Ht  (1.854 m)  Wt 139.844 kg (308 lb 4.8 oz)  BMI 40.68 kg/m2  SpO2 97%  Intake/Output Summary (Last 24 hours) at 10/08/14 1000 Last data filed at 10/08/14 0606  Gross per 24 hour  Intake   1224 ml  Output   3603 ml  Net  -2379 ml   Weight change: -2.256 kg (-4 lb 15.6 oz)  Physical Exam: Gen: Comfortably resting in bed CVS: Pulse regular in rate and rhythm Resp: Decreased breath sounds over bases otherwise clear Abd: Soft, obese, nontender Ext: 2-3+ lower extremity edema-pitting  Imaging: US Abdomen Limited Ruq  10/06/2014   CLINICAL DATA:  Initial evaluation for abdominal pain, personal history of cirrhosis  EXAM: US ABDOMEN LIMITED - RIGHT UPPER QUADRANT  COMPARISON: *06/11/2013  FINDINGS: Gallbladder:  As on the prior study the gallbladder wall is about 4 mm in thickness. There is no sonographic Murphy's sign. There are echogenic foci adherent to the wall of the gallbladder measuring up to 6 mm, which are not mobile.   Common bile duct:  Diameter: 4 mm  Liver:  Heterogeneous echotexture with nodular contour consistent with cirrhosis. There is trace fluid around the liver.  IMPRESSION: Evidence of cirrhosis, with nonspecific stable mild gallbladder wall thickening and no Murphy's sign. New from the prior study is the presence of several small echogenic foci adherent to the gallbladder wall, possibly representing complex sludge or small immobile calculi.   Electronically Signed   By: Esperanza Heir M.D.   On: 10/06/2014 16:09    Labs: BMET  Recent Labs Lab 10/02/14 0957 10/03/14 0439  10/04/14 0038 10/04/14 0730 10/04/14 1457 10/05/14 0306 10/06/14 0400 10/07/14 0400 10/08/14 0416  NA 140 141  --  140  --   --  141 140 140 141  K 6.4* 6.7*  < > 6.2* 6.2* 6.2* 5.9* 5.4* 5.0 5.0  CL 114* 114*  --  116*  --   --  113* 112 109 109  CO2 20 19  --  19  --   --  GLUCOSE 133* 156*  --  171*  --   --  173* 124* 120* 161*  BUN 63* 64*  --  63*  --   --  61* 64* 67* 69*  CREATININE 3.15* 2.82*  --  2.69*  --   --  2.64* 2.73* 2.89* 2.74*  CALCIUM 8.9 9.2  --  9.0  --   --  9.4 9.0 9.1 9.0  < > = values in this interval not displayed. CBC  Recent  Labs Lab 10/02/14 0957 10/03/14 0439 10/06/14 0400 10/07/14 0400  WBC 6.0 6.2 5.1 5.3  NEUTROABS  --  4.2  --   --   HGB 9.0* 8.9* 8.5* 8.4*  HCT 29.2* 29.5* 27.4* 27.3*  MCV 85.6 84.8 86.2 83.7  PLT 75* 70* 61* 69*   Medications:    . apixaban  5 mg Oral BID  . insulin aspart  0-15 Units Subcutaneous TID WC  . insulin aspart  0-5 Units Subcutaneous QHS  . insulin glargine  50 Units Subcutaneous BID  . lactulose  20 g Oral TID  . levothyroxine  187.5 mcg Oral QAC breakfast  . linagliptin  5 mg Oral Daily  . mexiletine  150 mg Oral 3 times per day  . rosuvastatin  10 mg Oral q1800  . sodium chloride  3 mL Intravenous Q12H  . tamsulosin  0.4 mg Oral Daily   Zetta Bills, MD 10/08/2014, 10:00 AM

## 2014-10-08 NOTE — Progress Notes (Signed)
Per attending physician, Foley catheter has been removed.

## 2014-10-08 NOTE — Progress Notes (Signed)
Patient Demographics  [name], is a 59 y.o. male, DOB - [birth year], ZOX:[id number]  Admit date - 10/02/2014   Admitting Physician Joseph Art, DO  Outpatient Primary MD for the patient is [name], MD  LOS - 6   Chief Complaint  Patient presents with  . Shortness of Breath  . Edema      Summary  59 year old male who was recently in the hospital and discharged on 12/4 for acute on chronic systolic HF, AKI on CKD, cirrhosis. He was seen by Dr. Madilyn Fireman today who suggested he come to the ER. He was d/c'd on lasix 40 mg but that was increased to 60 mg as an outpatient. Patient has not missed any doses and his diet has not changed. Also outpatient he was taken off his amio as his cirrhosis has worsened and he was to see a liver specialist  He comes in with increased SOB that has been worsening for the last 5 days. He has noticed LE swelling and increased abdominal swelling. Unable to lay still. Has noticed he has had a harder time urinating and his urine has decreased and he feels as if his bladder is not emptying. In the ER, his Cr was found to be elevated. K was 6.4. DVT studies were negative in the ER- Baker's cyst in right popliteal fossa.    Since here he has been seen by cardiology and nephrology, his required IV Lasix drip for diuresis, he also underwent paracentesis for 1-1/2 L of ascites fluid removal, his SAAG was over 2. He will require urology and GI follow-up post discharge. Likely will stay in the hospital for the next 3-4 days for continued diuresis.    Subjective:   [name] staes feeling better, remains of Lasix drip. He denies shortness of breath, fevers, chills, nausea, vomiting. He is tolerating PO intake.   Assessment & Plan    1. Acute on chronic  combined systolic and diastolic congestive heart failure -Patient had been on Lasix drip up until today with good results. So far has had a net negative fluid balance of 4.8 L urinating 2.9 L the last 24 hours -Remains volume overloaded, however, nephrology recommending transitioning to IV bolus of Lasix and discontinuing Lasix drip. -Starting Lasix 120 mg IV 3 times a day -Started Coreg 3.125 mg by mouth twice a day per cardiology  Filed Weights   10/06/14 0500 10/07/14 0433 10/08/14 0521  Weight: 140.161 kg (309 lb) 142.1 kg (313 lb 4.4 oz) 139.844 kg (308 lb 4.8 oz)   2. NSTEMI with LV clot-  -Patient anticoagulated with Apixaban -Cardiology following  3. Acute on chronic renal failure. -Having baseline creatinine near 1.7  -Presented with creatinine of 3.15 on 10/02/2014 -Labs showing downward trend in creatinine to 2.7 -Nephrology following  4. DM type II. Continue Lantus 50 units [place] BID and sliding scale  Lab Results  Component Value Date   HGBA1C 6.8* 08/31/2014    CBG (last 3)   Recent Labs  10/08/14 0603 10/08/14 1110 10/08/14 1557  GLUCAP 150* 225* 188*     Code Status: Full  Family Communication: none  Disposition Plan: Home 3-4 days   Procedures   RUQ [place] pending   Paracentesis - 1.5  lits removed, SAAG 2   TTE  08-2014  - Left ventricle: There is 1.5cm x 1.5cm clot in the apex. Byreport, this clot has been seen in the past. The cavity size wasmoderately to severely dilated. Wall thickness was increased in apattern of moderate to severe LVH. The estimated ejectionfraction was 25%. Diffuse hypokinesis. Doppler parameters areconsistent with high ventricular filling pressure. - Left atrium: The atrium was moderately dilated. - Right ventricle: There is some RV dysfunction. Not able to assess RV function due to poor visualization   Vascular US  - No obvious evidence of deep vein or superficial thrombosisinvolving the right lower  extremity and left lower extremity.Interstitial fluid is noted in both calves. - Incidental findings are consistent with: Baker&'s Cyst on the right. - No evidence of Baker&'s cyst on the left.   Renal US   Normal renal ultrasound. Negative for obstruction or stones.     Consults  Cards, renal   Medications  Scheduled Meds: . apixaban  5 mg Oral BID  . carvedilol  3.125 mg Oral BID WC  . dextromethorphan  30 mg Oral BID  . furosemide  120 mg Intravenous 3 times per day  . hydrALAZINE  10 mg Oral 3 times per day  . insulin aspart  0-15 Units Subcutaneous TID WC  . insulin aspart  0-5 Units Subcutaneous QHS  . insulin glargine  50 Units Subcutaneous BID  . lactulose  20 g Oral TID  . levothyroxine  187.5 mcg Oral QAC breakfast  . linagliptin  5 mg Oral Daily  . mexiletine  150 mg Oral 3 times per day  . rosuvastatin  10 mg Oral q1800  . sodium chloride  3 mL Intravenous Q12H  . tamsulosin  0.4 mg Oral Daily   Continuous Infusions:   PRN Meds:.sodium chloride, acetaminophen, ondansetron (ZOFRAN) IV, sodium chloride  DVT Prophylaxis Eliquis  Lab Results  Component Value Date   PLT 69* 10/07/2014    Antibiotics     Anti-infectives    None          Objective:   Filed Vitals:   10/07/14 2027 10/08/14 0521 10/08/14 1142 10/08/14 1626  BP: 116/63 134/53 125/47 104/52  Pulse: 81 73 74 64  Temp: 97.1 F (36.2 C) 98.2 F (36.8 C)    TempSrc: Oral Oral    Resp: 18 20    Height:      Weight:  139.844 kg (308 lb 4.8 oz)    SpO2: 100% 97%      Wt Readings from Last 3 Encounters:  10/08/14 139.844 kg (308 lb 4.8 oz)  09/11/14 147.873 kg (326 lb)  09/06/14 142.9 kg (315 lb 0.6 oz)     Intake/Output Summary (Last 24 hours) at 10/08/14 1703 Last data filed at 10/08/14 1628  Gross per 24 hour  Intake   1206 ml  Output   2925 ml  Net  -1719 ml     Physical Exam  Awake Alert, Oriented X 3, No new F.N deficits, Normal affect [place].AT,PERRAL Supple  Neck,No JVD, No cervical lymphadenopathy appriciated.  Symmetrical Chest wall movement, Good air movement bilaterally, +ve rales RRR,No Gallops,Rubs or new Murmurs, No Parasternal Heave +ve B.Sounds, Abd distended, No tenderness, No organomegaly appriciated, No rebound - guarding or rigidity. No Cyanosis, Clubbing  No new Rash or bruise    2+ edema, Foley in place with mild hematuria  Data Review   Micro Results Recent Results (from the past 240 hour(s))  Body fluid culture  Status: None   Collection Time: 10/04/14 10:12 AM  Result Value Ref Range Status   Specimen Description ASCITIC  Final   Special Requests NONE  Final   Gram Stain   Final    RARE WBC PRESENT, PREDOMINANTLY MONONUCLEAR NO ORGANISMS SEEN Performed at [facility]    Culture   Final    NO GROWTH 3 DAYS Performed at [facility]    Report Status 10/08/2014 FINAL  Final    Radiology Reports Dg Chest 2 View  10/02/2014   CLINICAL DATA:  Increase shortness of breath for 5 days. Increasing abdominal ascites and lower extremity edema. Cough, congestion for 5 days.  EXAM: CHEST  2 VIEW  COMPARISON:  08/31/2014  FINDINGS: Cardiomegaly with vascular congestion. Left pacer is unchanged. No overt edema. Bibasilar opacities are stable in likely reflect atelectasis or scarring. No effusions or acute bony abnormality.  IMPRESSION: Mild cardiomegaly and vascular congestion. Bibasilar atelectasis or scarring. No change.   Electronically Signed   By: Charlett Nose M.D.   On: 10/02/2014 11:09     US Renal  10/02/2014   CLINICAL DATA:  Urinary retention. Worsening renal function. Evaluate for obstruction.  EXAM: RENAL/URINARY TRACT ULTRASOUND COMPLETE  COMPARISON:  CT 09/04/2014.  FINDINGS: Right Kidney:  Length: 11.7 cm. Echogenicity within normal limits. No mass or hydronephrosis visualized.  Left Kidney:  Length: 10.2 cm. Echogenicity within normal limits. No mass or hydronephrosis visualized.  Bladder:   Collapse with Foley catheter.  IMPRESSION: Normal renal ultrasound.  Negative for obstruction or stones.   Electronically Signed   By: Andreas Newport M.D.   On: 10/02/2014 13:59     CBC  Recent Labs Lab 10/02/14 0957 10/03/14 0439 10/06/14 0400 10/07/14 0400  WBC 6.0 6.2 5.1 5.3  HGB 9.0* 8.9* 8.5* 8.4*  HCT 29.2* 29.5* 27.4* 27.3*  PLT 75* 70* 61* 69*  MCV 85.6 84.8 86.2 83.7  MCH 26.4 25.6* 26.7 25.8*  MCHC 30.8 30.2 31.0 30.8  RDW 15.7* 15.8* 16.1* 16.2*  LYMPHSABS  --  1.0  --   --   MONOABS  --  0.9  --   --   EOSABS  --  0.1  --   --   BASOSABS  --  0.0  --   --     Chemistries   Recent Labs Lab 10/02/14 0957  10/04/14 0038  10/04/14 0915 10/04/14 1457 10/05/14 0306 10/06/14 0400 10/07/14 0400 10/08/14 0416  NA 140  < > 140  --   --   --  141 140 140 141  K 6.4*  < > 6.2*  < >  --  6.2* 5.9* 5.4* 5.0 5.0  CL 114*  < > 116*  --   --   --  113* 112 109 109  CO2 20  < > 19  --   --   --  21 20 23 22   GLUCOSE 133*  < > 171*  --   --   --  173* 124* 120* 161*  BUN 63*  < > 63*  --   --   --  61* 64* 67* 69*  CREATININE 3.15*  < > 2.69*  --   --   --  2.64* 2.73* 2.89* 2.74*  CALCIUM 8.9  < > 9.0  --   --   --  9.4 9.0 9.1 9.0  AST 77*  --   --   --  64*  --   --   --  87*  --   ALT 38  --   --   --  40  --   --   --  58*  --   ALKPHOS 86  --   --   --  81  --   --   --  101  --   BILITOT 0.4  --   --   --  0.6  --   --   --  0.4  --   < > = values in this interval not displayed. ------------------------------------------------------------------------------------------------------------------ estimated creatinine clearance is 43.2 mL/min (by C-G formula based on Cr of 2.74). ------------------------------------------------------------------------------------------------------------------ No results for input(s): HGBA1C in the last 72 hours. ------------------------------------------------------------------------------------------------------------------ No  results for input(s): CHOL, HDL, LDLCALC, TRIG, CHOLHDL, LDLDIRECT in the last 72 hours. ------------------------------------------------------------------------------------------------------------------ No results for input(s): TSH, T4TOTAL, T3FREE, THYROIDAB in the last 72 hours.  Invalid input(s): FREET3 ------------------------------------------------------------------------------------------------------------------  Recent Labs  10/06/14 0744  VITAMINB12 390  FOLATE 7.7  FERRITIN 154  TIBC 352  IRON 37*  RETICCTPCT 1.4    Coagulation profile No results for input(s): INR, PROTIME in the last 168 hours.  No results for input(s): DDIMER in the last 72 hours.  Cardiac Enzymes  Recent Labs Lab 10/03/14 0916 10/03/14 1600 10/04/14 0038  TROPONINI 0.22* 0.23* 0.23*   ------------------------------------------------------------------------------------------------------------------ Invalid input(s): POCBNP     Time Spent in minutes  25   Jeralyn Bennett M.D on 10/08/2014 at 5:03 PM  Between 7am to 7pm - Pager - [phone]  After 7pm go to [email or web address] - password [email or web address]  [facility] Office  [phone]

## 2014-10-09 LAB — CBC
HEMATOCRIT: 27.3 % — AB (ref 39.0–52.0)
Hemoglobin: 8.6 g/dL — ABNORMAL LOW (ref 13.0–17.0)
MCH: 26.9 pg (ref 26.0–34.0)
MCHC: 31.5 g/dL (ref 30.0–36.0)
MCV: 85.3 fL (ref 78.0–100.0)
Platelets: 63 10*3/uL — ABNORMAL LOW (ref 150–400)
RBC: 3.2 MIL/uL — ABNORMAL LOW (ref 4.22–5.81)
RDW: 16.6 % — ABNORMAL HIGH (ref 11.5–15.5)
WBC: 5.3 10*3/uL (ref 4.0–10.5)

## 2014-10-09 LAB — BASIC METABOLIC PANEL
Anion gap: 11 (ref 5–15)
BUN: 71 mg/dL — ABNORMAL HIGH (ref 6–23)
CHLORIDE: 108 meq/L (ref 96–112)
CO2: 23 mmol/L (ref 19–32)
Calcium: 9.2 mg/dL (ref 8.4–10.5)
Creatinine, Ser: 2.68 mg/dL — ABNORMAL HIGH (ref 0.50–1.35)
GFR calc Af Amer: 29 mL/min — ABNORMAL LOW (ref >90)
GFR calc non Af Amer: 25 mL/min — ABNORMAL LOW (ref >90)
Glucose, Bld: 146 mg/dL — ABNORMAL HIGH (ref 70–99)
Potassium: 4.3 mmol/L (ref 3.5–5.1)
Sodium: 142 mmol/L (ref 135–145)

## 2014-10-09 LAB — GLUCOSE, CAPILLARY
GLUCOSE-CAPILLARY: 127 mg/dL — AB (ref 70–99)
GLUCOSE-CAPILLARY: 155 mg/dL — AB (ref 70–99)
Glucose-Capillary: 139 mg/dL — ABNORMAL HIGH (ref 70–99)
Glucose-Capillary: 216 mg/dL — ABNORMAL HIGH (ref 70–99)

## 2014-10-09 MED ORDER — GUAIFENESIN ER 600 MG PO TB12
1200.0000 mg | ORAL_TABLET | Freq: Two times a day (BID) | ORAL | Status: DC
  Administered 2014-10-09 – 2014-10-11 (×5): 1200 mg via ORAL
  Filled 2014-10-09 (×6): qty 2

## 2014-10-09 MED ORDER — IPRATROPIUM-ALBUTEROL 0.5-2.5 (3) MG/3ML IN SOLN: 3.0000 mL | RESPIRATORY_TRACT | Status: DC | PRN

## 2014-10-09 MED ORDER — IPRATROPIUM-ALBUTEROL 0.5-2.5 (3) MG/3ML IN SOLN
3.0000 mL | RESPIRATORY_TRACT | Status: DC
  Administered 2014-10-09: 3 mL via RESPIRATORY_TRACT
  Filled 2014-10-09: qty 3

## 2014-10-09 MED ORDER — ISOSORBIDE MONONITRATE 15 MG HALF TABLET
15.0000 mg | ORAL_TABLET | Freq: Every day | ORAL | Status: DC
  Administered 2014-10-09 – 2014-10-10 (×2): 15 mg via ORAL
  Filled 2014-10-09 (×2): qty 1

## 2014-10-09 NOTE — Progress Notes (Signed)
Patient Demographics  Cliffton Spradley, is a 59 y.o. male, DOB - [birth year], ZOX:[id number]  Admit date - 10/02/2014   Admitting Physician Joseph Art, DO  Outpatient Primary MD for the patient is [name], MD  LOS - 7   Chief Complaint  Patient presents with  . Shortness of Breath  . Edema      Summary  59 year old male who was recently in the hospital and discharged on 12/4 for acute on chronic systolic HF, AKI on CKD, cirrhosis. He was seen by Dr. Madilyn Fireman today who suggested he come to the ER. He was d/c'd on lasix 40 mg but that was increased to 60 mg as an outpatient. Patient has not missed any doses and his diet has not changed. Also outpatient he was taken off his amio as his cirrhosis has worsened and he was to see a liver specialist  He comes in with increased SOB that has been worsening for the last 5 days. He has noticed LE swelling and increased abdominal swelling. Unable to lay still. Has noticed he has had a harder time urinating and his urine has decreased and he feels as if his bladder is not emptying. In the ER, his Cr was found to be elevated. K was 6.4. DVT studies were negative in the ER- Baker's cyst in right popliteal fossa.    Since here he has been seen by cardiology and nephrology, his required IV Lasix drip for diuresis, he also underwent paracentesis for 1-1/2 L of ascites fluid removal, his SAAG was over 2. He will require urology and GI follow-up post discharge. Likely will stay in the hospital for the next 3-4 days for continued diuresis.    Subjective:   [name] staes feeling better, remains of Lasix drip. He denies shortness of breath, fevers, chills, nausea, vomiting. He is tolerating PO intake.   Assessment & Plan    1. Acute on chronic  combined systolic and diastolic congestive heart failure -Patient initially treated with lasix gtt, transition to IV Lasix 120 mg 3 times a day, diuresis slowing down as far has had a net negative fluid balance of 4.3 L urinating 1.6 L the last 24 hours -Remains volume overloaded, however, nephrology recommending transitioning to IV bolus of Lasix and discontinuing Lasix drip. -Started Coreg 3.125 mg by mouth twice a day per cardiology -Patient reporting feeling better, possible transition to oral Lasix the next 24-48 hrs.  Filed Weights   10/07/14 0433 10/08/14 0521 10/09/14 0545  Weight: 142.1 kg (313 lb 4.4 oz) 139.844 kg (308 lb 4.8 oz) 137.9 kg (304 lb 0.2 oz)   2. NSTEMI with LV clot-  -Patient anticoagulated with Apixaban -Cardiology following  3. Acute on chronic renal failure. -Having baseline creatinine near 1.7  -Presented with creatinine of 3.15 on 10/02/2014 -Labs showing downward trend in creatinine to 2.68 on 10/09/2014 -Nephrology following  4. DM type II. Continue Lantus 50 units [place] BID and sliding scale  Lab Results  Component Value Date   HGBA1C 6.8* 08/31/2014    CBG (last 3)   Recent Labs  10/09/14 0542 10/09/14 1059 10/09/14 1700  GLUCAP 139* 216* 127*     Code Status: Full  Family Communication: none  Disposition Plan: Anticipate  discharge to home in medically stable   Procedures   RUQ [place] pending   Paracentesis - 1.5 lits removed, SAAG 2   TTE  08-2014  - Left ventricle: There is 1.5cm x 1.5cm clot in the apex. Byreport, this clot has been seen in the past. The cavity size wasmoderately to severely dilated. Wall thickness was increased in apattern of moderate to severe LVH. The estimated ejectionfraction was 25%. Diffuse hypokinesis. Doppler parameters areconsistent with high ventricular filling pressure. - Left atrium: The atrium was moderately dilated. - Right ventricle: There is some RV dysfunction. Not able to assess RV  function due to poor visualization   Vascular US  - No obvious evidence of deep vein or superficial thrombosisinvolving the right lower extremity and left lower extremity.Interstitial fluid is noted in both calves. - Incidental findings are consistent with: Baker&'s Cyst on the right. - No evidence of Baker&'s cyst on the left.   Renal US   Normal renal ultrasound. Negative for obstruction or stones.     Consults  Cards, renal   Medications  Scheduled Meds: . apixaban  5 mg Oral BID  . carvedilol  3.125 mg Oral BID WC  . dextromethorphan  30 mg Oral BID  . furosemide  120 mg Intravenous 3 times per day  . guaiFENesin  1,200 mg Oral BID  . hydrALAZINE  10 mg Oral 3 times per day  . insulin aspart  0-15 Units Subcutaneous TID WC  . insulin aspart  0-5 Units Subcutaneous QHS  . insulin glargine  50 Units Subcutaneous BID  . isosorbide mononitrate  15 mg Oral Daily  . lactulose  20 g Oral TID  . levothyroxine  187.5 mcg Oral QAC breakfast  . linagliptin  5 mg Oral Daily  . mexiletine  150 mg Oral 3 times per day  . rosuvastatin  10 mg Oral q1800  . sodium chloride  3 mL Intravenous Q12H  . tamsulosin  0.4 mg Oral Daily   Continuous Infusions:   PRN Meds:.sodium chloride, acetaminophen, ipratropium-albuterol, ondansetron (ZOFRAN) IV, sodium chloride  DVT Prophylaxis Eliquis  Lab Results  Component Value Date   PLT 63* 10/09/2014    Antibiotics     Anti-infectives    None          Objective:   Filed Vitals:   10/09/14 0545 10/09/14 1100 10/09/14 1415 10/09/14 1427  BP: 129/66 113/50 113/53 130/52  Pulse: 69 75 61 63  Temp: 98.6 F (37 C)   98.2 F (36.8 C)  TempSrc: Oral   Oral  Resp: 20   24  Height:      Weight: 137.9 kg (304 lb 0.2 oz)     SpO2: 97%   94%    Wt Readings from Last 3 Encounters:  10/09/14 137.9 kg (304 lb 0.2 oz)  09/11/14 147.873 kg (326 lb)  09/06/14 142.9 kg (315 lb 0.6 oz)     Intake/Output Summary (Last 24  hours) at 10/09/14 1736 Last data filed at 10/09/14 1701  Gross per 24 hour  Intake 1843.93 ml  Output   1303 ml  Net 540.93 ml     Physical Exam  Awake Alert, Oriented X 3, No new F.N deficits, Normal affect [place].AT,PERRAL Supple Neck,No JVD, No cervical lymphadenopathy appriciated.  Symmetrical Chest wall movement, Good air movement bilaterally, +ve rales RRR,No Gallops,Rubs or new Murmurs, No Parasternal Heave +ve B.Sounds, Abd distended, No tenderness, No organomegaly appriciated, No rebound - guarding or rigidity. No Cyanosis, Clubbing  No new Rash or bruise    2+ edema, Foley in place with mild hematuria  Data Review   Micro Results Recent Results (from the past 240 hour(s))  Body fluid culture     Status: None   Collection Time: 10/04/14 10:12 AM  Result Value Ref Range Status   Specimen Description ASCITIC  Final   Special Requests NONE  Final   Gram Stain   Final    RARE WBC PRESENT, PREDOMINANTLY MONONUCLEAR NO ORGANISMS SEEN Performed at [facility]    Culture   Final    NO GROWTH 3 DAYS Performed at [facility]    Report Status 10/08/2014 FINAL  Final    Radiology Reports Dg Chest 2 View  10/02/2014   CLINICAL DATA:  Increase shortness of breath for 5 days. Increasing abdominal ascites and lower extremity edema. Cough, congestion for 5 days.  EXAM: CHEST  2 VIEW  COMPARISON:  08/31/2014  FINDINGS: Cardiomegaly with vascular congestion. Left pacer is unchanged. No overt edema. Bibasilar opacities are stable in likely reflect atelectasis or scarring. No effusions or acute bony abnormality.  IMPRESSION: Mild cardiomegaly and vascular congestion. Bibasilar atelectasis or scarring. No change.   Electronically Signed   By: Charlett Nose M.D.   On: 10/02/2014 11:09     US Renal  10/02/2014   CLINICAL DATA:  Urinary retention. Worsening renal function. Evaluate for obstruction.  EXAM: RENAL/URINARY TRACT ULTRASOUND COMPLETE  COMPARISON:  CT  09/04/2014.  FINDINGS: Right Kidney:  Length: 11.7 cm. Echogenicity within normal limits. No mass or hydronephrosis visualized.  Left Kidney:  Length: 10.2 cm. Echogenicity within normal limits. No mass or hydronephrosis visualized.  Bladder:  Collapse with Foley catheter.  IMPRESSION: Normal renal ultrasound.  Negative for obstruction or stones.   Electronically Signed   By: Andreas Newport M.D.   On: 10/02/2014 13:59     CBC  Recent Labs Lab 10/03/14 0439 10/06/14 0400 10/07/14 0400 10/09/14 0336  WBC 6.2 5.1 5.3 5.3  HGB 8.9* 8.5* 8.4* 8.6*  HCT 29.5* 27.4* 27.3* 27.3*  PLT 70* 61* 69* 63*  MCV 84.8 86.2 83.7 85.3  MCH 25.6* 26.7 25.8* 26.9  MCHC 30.2 31.0 30.8 31.5  RDW 15.8* 16.1* 16.2* 16.6*  LYMPHSABS 1.0  --   --   --   MONOABS 0.9  --   --   --   EOSABS 0.1  --   --   --   BASOSABS 0.0  --   --   --     Chemistries   Recent Labs Lab 10/04/14 0915  10/05/14 0306 10/06/14 0400 10/07/14 0400 10/08/14 0416 10/09/14 0336  NA  --   --  141 140 140 141 142  K  --   < > 5.9* 5.4* 5.0 5.0 4.3  CL  --   --  113* 112 109 109 108  CO2  --   --  GLUCOSE  --   --  173* 124* 120* 161* 146*  BUN  --   --  61* 64* 67* 69* 71*  CREATININE  --   --  2.64* 2.73* 2.89* 2.74* 2.68*  CALCIUM  --   --  9.4 9.0 9.1 9.0 9.2  AST 64*  --   --   --  87*  --   --   ALT 40  --   --   --  58*  --   --   ALKPHOS  81  --   --   --  101  --   --   BILITOT 0.6  --   --   --  0.4  --   --   < > = values in this interval not displayed. ------------------------------------------------------------------------------------------------------------------ estimated creatinine clearance is 43.8 mL/min (by C-G formula based on Cr of 2.68). ------------------------------------------------------------------------------------------------------------------ No results for input(s): HGBA1C in the last 72  hours. ------------------------------------------------------------------------------------------------------------------ No results for input(s): CHOL, HDL, LDLCALC, TRIG, CHOLHDL, LDLDIRECT in the last 72 hours. ------------------------------------------------------------------------------------------------------------------ No results for input(s): TSH, T4TOTAL, T3FREE, THYROIDAB in the last 72 hours.  Invalid input(s): FREET3 ------------------------------------------------------------------------------------------------------------------ No results for input(s): VITAMINB12, FOLATE, FERRITIN, TIBC, IRON, RETICCTPCT in the last 72 hours.  Coagulation profile No results for input(s): INR, PROTIME in the last 168 hours.  No results for input(s): DDIMER in the last 72 hours.  Cardiac Enzymes  Recent Labs Lab 10/03/14 0916 10/03/14 1600 10/04/14 0038  TROPONINI 0.22* 0.23* 0.23*   ------------------------------------------------------------------------------------------------------------------ Invalid input(s): POCBNP     Time Spent in minutes  25   Jeralyn Bennett M.D on 10/09/2014 at 5:36 PM  Between 7am to 7pm - Pager - [phone]  After 7pm go to [email or web address] - password [facility]  [facility] Office  [phone]

## 2014-10-09 NOTE — Progress Notes (Signed)
Patient Name: Dakota FILLERS Sr. Date of Encounter: 10/09/2014     Principal Problem:   Acute on chronic combined systolic and diastolic congestive heart failure Active Problems:   Type 2 diabetes mellitus treated with insulin   Hyperlipidemia   Obesity   HTN (hypertension)   Hypertrophic obstructive cardiomyopathy   Automatic implantable cardioverter-defibrillator in situ   Ventricular tachycardia   Acute on chronic renal insufficiency   Hyperkalemia   Mural thrombus of left ventricle   Anemia   Chronic anticoagulation   Ascites   Cirrhosis    SUBJECTIVE  Denies any SOB or CP.   CURRENT MEDS . apixaban  5 mg Oral BID  . carvedilol  3.125 mg Oral BID WC  . dextromethorphan  30 mg Oral BID  . furosemide  120 mg Intravenous 3 times per day  . hydrALAZINE  10 mg Oral 3 times per day  . insulin aspart  0-15 Units Subcutaneous TID WC  . insulin aspart  0-5 Units Subcutaneous QHS  . insulin glargine  50 Units Subcutaneous BID  . lactulose  20 g Oral TID  . levothyroxine  187.5 mcg Oral QAC breakfast  . linagliptin  5 mg Oral Daily  . mexiletine  150 mg Oral 3 times per day  . rosuvastatin  10 mg Oral q1800  . sodium chloride  3 mL Intravenous Q12H  . tamsulosin  0.4 mg Oral Daily    OBJECTIVE  Filed Vitals:   10/08/14 1415 10/08/14 1626 10/08/14 1929 10/09/14 0545  BP: 116/61 104/52 117/60 129/66  Pulse: 73 64 74 69  Temp: 98.1 F (36.7 C)  98 F (36.7 C) 98.6 F (37 C)  TempSrc: Oral  Oral Oral  Resp: 20  20 20   Height:      Weight:    304 lb 0.2 oz (137.9 kg)  SpO2: 100%  97% 97%    Intake/Output Summary (Last 24 hours) at 10/09/14 0858 Last data filed at 10/09/14 0727  Gross per 24 hour  Intake 1907.93 ml  Output   1700 ml  Net 207.93 ml   Filed Weights   10/07/14 0433 10/08/14 0521 10/09/14 0545  Weight: 313 lb 4.4 oz (142.1 kg) 308 lb 4.8 oz (139.844 kg) 304 lb 0.2 oz (137.9 kg)    PHYSICAL EXAM  General: Pleasant, NAD. Neuro: Alert and  oriented X 3. Moves all extremities spontaneously. Psych: Normal affect. HEENT:  Normal  Neck: Supple without bruits or JVD. Lungs:  Resp regular and unlabored, CTA. Heart: RRR no s3, s4, or murmurs. Abdomen: Soft, non-tender, non-distended, BS + x 4.  Extremities: No clubbing, cyanosis. DP/PT/Radials 2+ and equal bilaterally. 2+ bilateral LE edema  Accessory Clinical Findings  CBC  Recent Labs  10/07/14 0400 10/09/14 0336  WBC 5.3 5.3  HGB 8.4* 8.6*  HCT 27.3* 27.3*  MCV 83.7 85.3  PLT 69* 63*   Basic Metabolic Panel  Recent Labs  10/08/14 0416 10/09/14 0336  NA 141 142  K 5.0 4.3  CL 109 108  CO2 22 23  GLUCOSE 161* 146*  BUN 69* 71*  CREATININE 2.74* 2.68*  CALCIUM 9.0 9.2   Liver Function Tests  Recent Labs  10/07/14 0400  AST 87*  ALT 58*  ALKPHOS 101  BILITOT 0.4  PROT 6.5  ALBUMIN 3.0*    TELE Not on telemetry    ECG  No new EKG  Echocardiogram  LV EF: 25%  ------------------------------------------------------------------- Indications:   CHF - 428.0.  -------------------------------------------------------------------  Study Conclusions  - Left ventricle: There is 1.5cm x 1.5cm clot in the apex. By report, this clot has been seen in the past. The cavity size was moderately to severely dilated. Wall thickness was increased in a pattern of moderate to severe LVH. The estimated ejection fraction was 25%. Diffuse hypokinesis. Doppler parameters are consistent with high ventricular filling pressure. - Left atrium: The atrium was moderately dilated. - Right ventricle: There is some RV dysfunction. Not able to assess RV function due to poor visualization. Not able to assess RV size due to poor visualization.     Radiology/Studies  Dg Chest 2 View  10/02/2014   CLINICAL DATA:  Increase shortness of breath for 5 days. Increasing abdominal ascites and lower extremity edema. Cough, congestion for 5 days.  EXAM: CHEST   2 VIEW  COMPARISON:  08/31/2014  FINDINGS: Cardiomegaly with vascular congestion. Left pacer is unchanged. No overt edema. Bibasilar opacities are stable in likely reflect atelectasis or scarring. No effusions or acute bony abnormality.  IMPRESSION: Mild cardiomegaly and vascular congestion. Bibasilar atelectasis or scarring. No change.   Electronically Signed   By: Charlett Nose M.D.   On: 10/02/2014 11:09   US Renal  10/02/2014   CLINICAL DATA:  Urinary retention. Worsening renal function. Evaluate for obstruction.  EXAM: RENAL/URINARY TRACT ULTRASOUND COMPLETE  COMPARISON:  CT 09/04/2014.  FINDINGS: Right Kidney:  Length: 11.7 cm. Echogenicity within normal limits. No mass or hydronephrosis visualized.  Left Kidney:  Length: 10.2 cm. Echogenicity within normal limits. No mass or hydronephrosis visualized.  Bladder:  Collapse with Foley catheter.  IMPRESSION: Normal renal ultrasound.  Negative for obstruction or stones.   Electronically Signed   By: Andreas Newport M.D.   On: 10/02/2014 13:59   US Paracentesis  10/04/2014   CLINICAL DATA:  Abdominal pain, abdominal distention. Ascites. Request diagnostic paracentesis  EXAM: ULTRASOUND GUIDED PARACENTESIS  COMPARISON:  None.  PROCEDURE: An ultrasound guided paracentesis was thoroughly discussed with the patient and questions answered. The benefits, risks, alternatives and complications were also discussed. The patient understands and wishes to proceed with the procedure. Written consent was obtained.  Ultrasound was performed to localize and mark an adequate pocket of fluid in the right lower quadrant of the abdomen. The area was then prepped and draped in the normal sterile fashion. 1% Lidocaine was used for local anesthesia. Under ultrasound guidance a 19 gauge Yueh catheter was introduced. Paracentesis was performed. The catheter was removed and a dressing applied.  COMPLICATIONS: None immediate  FINDINGS: A total of approximately 1.5 L of cloudy, amber  colored fluid was removed. A fluid sample was sent for laboratory analysis.  IMPRESSION: Successful ultrasound guided paracentesis yielding 1.5 L of ascites.  Read by: Brayton El PA-C   Electronically Signed   By: Oley Balm M.D.   On: 10/04/2014 11:09   US Abdomen Limited Ruq  10/06/2014   CLINICAL DATA:  Initial evaluation for abdominal pain, personal history of cirrhosis  EXAM: US ABDOMEN LIMITED - RIGHT UPPER QUADRANT  COMPARISON: *06/11/2013  FINDINGS: Gallbladder:  As on the prior study the gallbladder wall is about 4 mm in thickness. There is no sonographic Murphy's sign. There are echogenic foci adherent to the wall of the gallbladder measuring up to 6 mm, which are not mobile.  Common bile duct:  Diameter: 4 mm  Liver:  Heterogeneous echotexture with nodular contour consistent with cirrhosis. There is trace fluid around the liver.  IMPRESSION: Evidence of cirrhosis, with nonspecific stable  mild gallbladder wall thickening and no Murphy's sign. New from the prior study is the presence of several small echogenic foci adherent to the gallbladder wall, possibly representing complex sludge or small immobile calculi.   Electronically Signed   By: Esperanza Heir M.D.   On: 10/06/2014 16:09    ASSESSMENT AND PLAN  1. Acute on chronic combined systolic and diastolic CHF  - off coreg given acute heart failure  - weight down from 325 to 304, Net -4.5 L. 301 lbs on office visit with Dr. [name] in July 2015, on Dec followup, his weight increased to 326 lbs  - Lung essentially clear, however LE edema persistent, will need more IV lasix, transitioned to bolus IV lasix yesterday  - states his LE still is more swollen than his baseline, however base on his weight, he is likely close to baseline. Will continue IV lasix for now.  - unable to add ACEI or ARB given CKD, hydralazine added yesterday, nitroglycerine listed as an allergy, per pt, when he had MI before, he had ?AMS? after given  nitroglycerine  2. Cirrhosis with portal HTN and ascites: off amiodarone 3. Acute on chronic renal insufficiency 4. Hyperkalemia 5. Known LV thrombus: on apixaban 6. VT s/p ICD: on mexiletine 7. Hypertrophic cardiomyopathy  Signed, Azalee Course PA-C Pager: [phone] As above, patient seen and examined. He did not diurese much in the last 24 hours but clinically states he feels better. We'll continue Lasix at present dose and follow renal function. Continue low-dose Coreg and hydralazine. Will try low-dose imdur to see if patient tolerates. Olga Millers

## 2014-10-09 NOTE — Progress Notes (Signed)
Patient ID: Dakota Brook Sr., male   DOB: 12/17/1955, 59 y.o.   MRN: [id number]  [facility] Progress Note    Assessment/ Plan:   1. Acute renal failure on chronic kidney disease: Good urine output overnight, stable electrolytes and without any acute dialysis needs. Now on bolus doses of intravenous furosemide-will convert to oral furosemide tomorrow. 2. Volume overload/CHF decompensation: Responding well to diuretic therapy, continue to monitor for possible conversion to oral doses of diuretics tomorrow  3. Cirrhosis/portal hypertension/ascites: Recently underwent low-volume paracentesis and remains on lactulose 4. LV thrombus: Remains on Apixiban 5. Chronic systolic heart failure: Ongoing diuretic therapy-further management per cardiology  Subjective:   Reports to be feeling well-cough somewhat better    Objective:   BP 129/66 mmHg  Pulse 69  Temp(Src) 98.6 F (37 C) (Oral)  Resp 20  Ht 6\' 1"  (1.854 m)  Wt 137.9 kg (304 lb 0.2 oz)  BMI 40.12 kg/m2  SpO2 97%  Intake/Output Summary (Last 24 hours) at 10/09/14 0954 Last data filed at 10/09/14 0900  Gross per 24 hour  Intake 2127.93 ml  Output   1700 ml  Net 427.93 ml   Weight change: -1.944 kg (-4 lb 4.6 oz)  Physical Exam: Gen: Comfortably resting in bed CVS: Pulse regular in rate and rhythm, S1 and S2 normal Resp: Decreased breath sounds over bases otherwise clear Abd: Soft, obese, nontender Ext: 2-3+ lower extremity edema  Imaging: No results found.  Labs: BMET  Recent Labs Lab 10/03/14 0439  10/04/14 0038 10/04/14 0730 10/04/14 1457 10/05/14 0306 10/06/14 0400 10/07/14 0400 10/08/14 0416 10/09/14 0336  NA 141  --  140  --   --  141 140 140 141 142  K 6.7*  < > 6.2* 6.2* 6.2* 5.9* 5.4* 5.0 5.0 4.3  CL 114*  --  116*  --   --  113* 112 109 109 108  CO2 19  --  19  --   --  21 20 23 22 23   GLUCOSE 156*  --  171*  --   --  173* 124* 120* 161* 146*  BUN 64*  --  63*  --   --  61* 64* 67*  69* 71*  CREATININE 2.82*  --  2.69*  --   --  2.64* 2.73* 2.89* 2.74* 2.68*  CALCIUM 9.2  --  9.0  --   --  9.4 9.0 9.1 9.0 9.2  < > = values in this interval not displayed. CBC  Recent Labs Lab 10/03/14 0439 10/06/14 0400 10/07/14 0400 10/09/14 0336  WBC 6.2 5.1 5.3 5.3  NEUTROABS 4.2  --   --   --   HGB 8.9* 8.5* 8.4* 8.6*  HCT 29.5* 27.4* 27.3* 27.3*  MCV 84.8 86.2 83.7 85.3  PLT 70* 61* 69* 63*    Medications:    . apixaban  5 mg Oral BID  . carvedilol  3.125 mg Oral BID WC  . dextromethorphan  30 mg Oral BID  . furosemide  120 mg Intravenous 3 times per day  . hydrALAZINE  10 mg Oral 3 times per day  . insulin aspart  0-15 Units Subcutaneous TID WC  . insulin aspart  0-5 Units Subcutaneous QHS  . insulin glargine  50 Units Subcutaneous BID  . lactulose  20 g Oral TID  . levothyroxine  187.5 mcg Oral QAC breakfast  . linagliptin  5 mg Oral Daily  . mexiletine  150 mg Oral 3 times per day  .  rosuvastatin  10 mg Oral q1800  . sodium chloride  3 mL Intravenous Q12H  . tamsulosin  0.4 mg Oral Daily   Zetta Bills, MD 10/09/2014, 9:54 AM

## 2014-10-10 DIAGNOSIS — I10 Essential (primary) hypertension: Secondary | ICD-10-CM

## 2014-10-10 LAB — CBC
HEMATOCRIT: 26.5 % — AB (ref 39.0–52.0)
Hemoglobin: 8.2 g/dL — ABNORMAL LOW (ref 13.0–17.0)
MCH: 25.9 pg — ABNORMAL LOW (ref 26.0–34.0)
MCHC: 30.9 g/dL (ref 30.0–36.0)
MCV: 83.9 fL (ref 78.0–100.0)
PLATELETS: 71 10*3/uL — AB (ref 150–400)
RBC: 3.16 MIL/uL — ABNORMAL LOW (ref 4.22–5.81)
RDW: 16.6 % — AB (ref 11.5–15.5)
WBC: 5.2 10*3/uL (ref 4.0–10.5)

## 2014-10-10 LAB — GLUCOSE, CAPILLARY
GLUCOSE-CAPILLARY: 107 mg/dL — AB (ref 70–99)
Glucose-Capillary: 131 mg/dL — ABNORMAL HIGH (ref 70–99)
Glucose-Capillary: 82 mg/dL (ref 70–99)
Glucose-Capillary: 97 mg/dL (ref 70–99)

## 2014-10-10 LAB — BASIC METABOLIC PANEL
Anion gap: 8 (ref 5–15)
BUN: 72 mg/dL — AB (ref 6–23)
CALCIUM: 9 mg/dL (ref 8.4–10.5)
CO2: 26 mmol/L (ref 19–32)
Chloride: 108 mEq/L (ref 96–112)
Creatinine, Ser: 2.58 mg/dL — ABNORMAL HIGH (ref 0.50–1.35)
GFR calc non Af Amer: 26 mL/min — ABNORMAL LOW (ref >90)
GFR, EST AFRICAN AMERICAN: 30 mL/min — AB (ref >90)
GLUCOSE: 104 mg/dL — AB (ref 70–99)
Potassium: 4.2 mmol/L (ref 3.5–5.1)
SODIUM: 142 mmol/L (ref 135–145)

## 2014-10-10 LAB — EXPECTORATED SPUTUM ASSESSMENT W REFEX TO RESP CULTURE

## 2014-10-10 LAB — EXPECTORATED SPUTUM ASSESSMENT W GRAM STAIN, RFLX TO RESP C

## 2014-10-10 MED ORDER — ISOSORBIDE MONONITRATE ER 30 MG PO TB24
30.0000 mg | ORAL_TABLET | Freq: Every day | ORAL | Status: DC
  Administered 2014-10-11: 30 mg via ORAL
  Filled 2014-10-10: qty 1

## 2014-10-10 NOTE — Progress Notes (Signed)
Patient Demographics  [name], is a 59 y.o. male, DOB - 1956-05-29, ZOX:[id number]  Admit date - 10/02/2014   Admitting Physician Dakota Art, DO  Outpatient Primary MD for the patient is Dakota Heap, MD  LOS - 8   Chief Complaint  Patient presents with  . Shortness of Breath  . Edema      Summary  59 year old male who was recently in the hospital and discharged on 12/4 for acute on chronic systolic HF, AKI on CKD, cirrhosis. He was seen by [name] today who suggested he come to the ER. He was d/c'd on lasix 40 mg but that was increased to 60 mg as an outpatient. Patient has not missed any doses and his diet has not changed. Also outpatient he was taken off his amio as his cirrhosis has worsened and he was to see a liver specialist  He comes in with increased SOB that has been worsening for the last 5 days. He has noticed LE swelling and increased abdominal swelling. Unable to lay still. Has noticed he has had a harder time urinating and his urine has decreased and he feels as if his bladder is not emptying. In the ER, his Cr was found to be elevated. K was 6.4. DVT studies were negative in the ER- Baker's cyst in right popliteal fossa.    Since here he has been seen by cardiology and nephrology, his required IV Lasix drip for diuresis, he also underwent paracentesis for 1-1/2 L of ascites fluid removal, his SAAG was over 2. He will require urology and GI follow-up post discharge. Likely will stay in the hospital for the next 3-4 days for continued diuresis.    Subjective:   [name] staes feeling better, remains of Lasix drip. He denies shortness of breath, fevers, chills, nausea, vomiting. He is tolerating PO intake.   Assessment & Plan    1. Acute on chronic  combined systolic and diastolic congestive heart failure -Patient initially treated with lasix gtt, transition to IV Lasix 120 mg 3 times a day -Has a net negative fluid balance of 3.5 L, with urine output of 1.9 L in the past 24 hours -Cardiology recommending continuing Lasix 20 mg IV twice a day with plans to transition to oral Lasix in a.m.  Filed Weights   10/08/14 0521 10/09/14 0545 10/10/14 0547  Weight: 139.844 kg (308 lb 4.8 oz) 137.9 kg (304 lb 0.2 oz) 135.988 kg (299 lb 12.8 oz)   2. NSTEMI with LV clot-  -Patient anticoagulated with Apixaban -Cardiology following  3. Acute on chronic renal failure. -Having baseline creatinine near 1.7  -Creatinine continues to slowly trend down with a.m. lab work showing creatinine of 2.58 with BUN of 72 -Nephrology following  4. DM type II. Continue Lantus 50 units [place] BID and sliding scale. Blood sugar stable  Lab Results  Component Value Date   HGBA1C 6.8* 08/31/2014    CBG (last 3)   Recent Labs  10/09/14 2114 10/10/14 0614 10/10/14 1133  GLUCAP 155* 107* 131*     Code Status: Full  Family Communication: none  Disposition Plan: Anticipate discharge to home in medically stable   Procedures   RUQ [place] pending   Paracentesis - 1.5  lits removed, SAAG 2   TTE  08-2014  - Left ventricle: There is 1.5cm x 1.5cm clot in the apex. Byreport, this clot has been seen in the past. The cavity size wasmoderately to severely dilated. Wall thickness was increased in apattern of moderate to severe LVH. The estimated ejectionfraction was 25%. Diffuse hypokinesis. Doppler parameters areconsistent with high ventricular filling pressure. - Left atrium: The atrium was moderately dilated. - Right ventricle: There is some RV dysfunction. Not able to assess RV function due to poor visualization   Vascular US  - No obvious evidence of deep vein or superficial thrombosisinvolving the right lower extremity and left lower  extremity.Interstitial fluid is noted in both calves. - Incidental findings are consistent with: Baker&'s Cyst on the right. - No evidence of Baker&'s cyst on the left.   Renal US   Normal renal ultrasound. Negative for obstruction or stones.     Consults  Cards, renal   Medications  Scheduled Meds: . apixaban  5 mg Oral BID  . carvedilol  3.125 mg Oral BID WC  . dextromethorphan  30 mg Oral BID  . furosemide  120 mg Intravenous 3 times per day  . guaiFENesin  1,200 mg Oral BID  . hydrALAZINE  10 mg Oral 3 times per day  . insulin aspart  0-15 Units Subcutaneous TID WC  . insulin aspart  0-5 Units Subcutaneous QHS  . insulin glargine  50 Units Subcutaneous BID  . [START ON 10/11/2014] isosorbide mononitrate  30 mg Oral Daily  . lactulose  20 g Oral TID  . levothyroxine  187.5 mcg Oral QAC breakfast  . linagliptin  5 mg Oral Daily  . mexiletine  150 mg Oral 3 times per day  . rosuvastatin  10 mg Oral q1800  . sodium chloride  3 mL Intravenous Q12H  . tamsulosin  0.4 mg Oral Daily   Continuous Infusions:   PRN Meds:.sodium chloride, acetaminophen, ipratropium-albuterol, ondansetron (ZOFRAN) IV, sodium chloride  DVT Prophylaxis Eliquis  Lab Results  Component Value Date   PLT 71* 10/10/2014    Antibiotics     Anti-infectives    None          Objective:   Filed Vitals:   10/10/14 0547 10/10/14 0952 10/10/14 1455 10/10/14 1523  BP: 127/61 110/58 114/49 100/44  Pulse: 72 61 61 62  Temp: 98 F (36.7 C)     TempSrc: Oral     Resp: 20     Height:      Weight: 135.988 kg (299 lb 12.8 oz)     SpO2: 96%   99%    Wt Readings from Last 3 Encounters:  10/10/14 135.988 kg (299 lb 12.8 oz)  09/11/14 147.873 kg (326 lb)  09/06/14 142.9 kg (315 lb 0.6 oz)     Intake/Output Summary (Last 24 hours) at 10/10/14 1604 Last data filed at 10/10/14 1453  Gross per 24 hour  Intake 2932.07 ml  Output   2279 ml  Net 653.07 ml     Physical Exam  Awake  Alert, Oriented X 3, No new F.N deficits, Normal affect [name].AT,PERRAL Supple Neck,No JVD, No cervical lymphadenopathy appriciated.  Symmetrical Chest wall movement, Good air movement bilaterally, +ve rales RRR,No Gallops,Rubs or new Murmurs, No Parasternal Heave +ve B.Sounds, Abd distended, No tenderness, No organomegaly appriciated, No rebound - guarding or rigidity. No Cyanosis, Clubbing  No new Rash or bruise    2+ edema, Foley in place with mild hematuria  Data Review  Micro Results Recent Results (from the past 240 hour(s))  Body fluid culture     Status: None   Collection Time: 10/04/14 10:12 AM  Result Value Ref Range Status   Specimen Description ASCITIC  Final   Special Requests NONE  Final   Gram Stain   Final    RARE WBC PRESENT, PREDOMINANTLY MONONUCLEAR NO ORGANISMS SEEN Performed at [facility]    Culture   Final    NO GROWTH 3 DAYS Performed at [facility]    Report Status 10/08/2014 FINAL  Final    Radiology Reports Dg Chest 2 View  10/02/2014   CLINICAL DATA:  Increase shortness of breath for 5 days. Increasing abdominal ascites and lower extremity edema. Cough, congestion for 5 days.  EXAM: CHEST  2 VIEW  COMPARISON:  08/31/2014  FINDINGS: Cardiomegaly with vascular congestion. Left pacer is unchanged. No overt edema. Bibasilar opacities are stable in likely reflect atelectasis or scarring. No effusions or acute bony abnormality.  IMPRESSION: Mild cardiomegaly and vascular congestion. Bibasilar atelectasis or scarring. No change.   Electronically Signed   By: Charlett Nose M.D.   On: 10/02/2014 11:09     US Renal  10/02/2014   CLINICAL DATA:  Urinary retention. Worsening renal function. Evaluate for obstruction.  EXAM: RENAL/URINARY TRACT ULTRASOUND COMPLETE  COMPARISON:  CT 09/04/2014.  FINDINGS: Right Kidney:  Length: 11.7 cm. Echogenicity within normal limits. No mass or hydronephrosis visualized.  Left Kidney:  Length: 10.2 cm. Echogenicity  within normal limits. No mass or hydronephrosis visualized.  Bladder:  Collapse with Foley catheter.  IMPRESSION: Normal renal ultrasound.  Negative for obstruction or stones.   Electronically Signed   By: Andreas Newport M.D.   On: 10/02/2014 13:59     CBC  Recent Labs Lab 10/06/14 0400 10/07/14 0400 10/09/14 0336 10/10/14 0423  WBC 5.1 5.3 5.3 5.2  HGB 8.5* 8.4* 8.6* 8.2*  HCT 27.4* 27.3* 27.3* 26.5*  PLT 61* 69* 63* 71*  MCV 86.2 83.7 85.3 83.9  MCH 26.7 25.8* 26.9 25.9*  MCHC 31.0 30.8 31.5 30.9  RDW 16.1* 16.2* 16.6* 16.6*    Chemistries   Recent Labs Lab 10/04/14 0915  10/06/14 0400 10/07/14 0400 10/08/14 0416 10/09/14 0336 10/10/14 0423  NA  --   < > 140 140 141 142 142  K  --   < > 5.4* 5.0 5.0 4.3 4.2  CL  --   < > 112 109 109 108 108  CO2  --   < > 20 23 22 23 26   GLUCOSE  --   < > 124* 120* 161* 146* 104*  BUN  --   < > 64* 67* 69* 71* 72*  CREATININE  --   < > 2.73* 2.89* 2.74* 2.68* 2.58*  CALCIUM  --   < > 9.0 9.1 9.0 9.2 9.0  AST 64*  --   --  87*  --   --   --   ALT 40  --   --  58*  --   --   --   ALKPHOS 81  --   --  101  --   --   --   BILITOT 0.6  --   --  0.4  --   --   --   < > = values in this interval not displayed. ------------------------------------------------------------------------------------------------------------------ estimated creatinine clearance is 45.2 mL/min (by C-G formula based on Cr of 2.58). ------------------------------------------------------------------------------------------------------------------ No results for input(s): HGBA1C in the  last 72 hours. ------------------------------------------------------------------------------------------------------------------ No results for input(s): CHOL, HDL, LDLCALC, TRIG, CHOLHDL, LDLDIRECT in the last 72 hours. ------------------------------------------------------------------------------------------------------------------ No results for input(s): TSH, T4TOTAL, T3FREE,  THYROIDAB in the last 72 hours.  Invalid input(s): FREET3 ------------------------------------------------------------------------------------------------------------------ No results for input(s): VITAMINB12, FOLATE, FERRITIN, TIBC, IRON, RETICCTPCT in the last 72 hours.  Coagulation profile No results for input(s): INR, PROTIME in the last 168 hours.  No results for input(s): DDIMER in the last 72 hours.  Cardiac Enzymes  Recent Labs Lab 10/04/14 0038  TROPONINI 0.23*   ------------------------------------------------------------------------------------------------------------------ Invalid input(s): POCBNP     Time Spent in minutes  25   Jeralyn Bennett M.D on 10/10/2014 at 4:04 PM  Between 7am to 7pm - Pager - [phone]  After 7pm go to [email or web address] - password [email or web address]  [facility] Office  [phone]

## 2014-10-10 NOTE — Progress Notes (Signed)
Patient ID: Dakota Brook Sr., male   DOB: November 27, 1955, 59 y.o.   MRN: [id number]  [facility] Progress Note    Assessment/ Plan:   1. Acute renal failure on chronic kidney disease: Good urine output overnight, stable electrolytes and without any acute dialysis needs. Continues to show good response to diuretic therapy-convert to oral furosemide 120 mg twice a day today 2. Volume overload/CHF decompensation: Responding well to diuretic therapy, able to ambulate hallways without much assistance-reports significant improvement of edema 3. Cirrhosis/portal hypertension/ascites: Recently underwent low-volume paracentesis and remains on lactulose 4. LV thrombus: Remains on Apixiban 5. Chronic systolic heart failure: Ongoing diuretic therapy-further management per cardiology  Subjective:   Reports to be feeling better, no emerging complaints    Objective:   BP 110/58 mmHg  Pulse 61  Temp(Src) 98 F (36.7 C) (Oral)  Resp 20  Ht 6\' 1"  (1.854 m)  Wt 135.988 kg (299 lb 12.8 oz)  BMI 39.56 kg/m2  SpO2 96%  Intake/Output Summary (Last 24 hours) at 10/10/14 1055 Last data filed at 10/10/14 0951  Gross per 24 hour  Intake 2912.07 ml  Output   2104 ml  Net 808.07 ml   Weight change: -1.912 kg (-4 lb 3.4 oz)  Physical Exam: Gen: Comfortably resting in bed CVS: Pulse regular in rate and rhythm Resp: Clear to auscultation, no rales Abd: Soft, obese, nontender Ext: 2+ lower extremity edema  Imaging: No results found.  Labs: BMET  Recent Labs Lab 10/04/14 0038  10/04/14 1457 10/05/14 0306 10/06/14 0400 10/07/14 0400 10/08/14 0416 10/09/14 0336 10/10/14 0423  NA 140  --   --  141 140 140 141 142 142  K 6.2*  < > 6.2* 5.9* 5.4* 5.0 5.0 4.3 4.2  CL 116*  --   --  113* 112 109 109 108 108  CO2 19  --   --  21 20 23 22 23 26   GLUCOSE 171*  --   --  173* 124* 120* 161* 146* 104*  BUN 63*  --   --  61* 64* 67* 69* 71* 72*  CREATININE 2.69*  --   --  2.64* 2.73*  2.89* 2.74* 2.68* 2.58*  CALCIUM 9.0  --   --  9.4 9.0 9.1 9.0 9.2 9.0  < > = values in this interval not displayed. CBC  Recent Labs Lab 10/06/14 0400 10/07/14 0400 10/09/14 0336 10/10/14 0423  WBC 5.1 5.3 5.3 5.2  HGB 8.5* 8.4* 8.6* 8.2*  HCT 27.4* 27.3* 27.3* 26.5*  MCV 86.2 83.7 85.3 83.9  PLT 61* 69* 63* 71*    Medications:    . apixaban  5 mg Oral BID  . carvedilol  3.125 mg Oral BID WC  . dextromethorphan  30 mg Oral BID  . furosemide  120 mg Intravenous 3 times per day  . guaiFENesin  1,200 mg Oral BID  . hydrALAZINE  10 mg Oral 3 times per day  . insulin aspart  0-15 Units Subcutaneous TID WC  . insulin aspart  0-5 Units Subcutaneous QHS  . insulin glargine  50 Units Subcutaneous BID  . isosorbide mononitrate  15 mg Oral Daily  . lactulose  20 g Oral TID  . levothyroxine  187.5 mcg Oral QAC breakfast  . linagliptin  5 mg Oral Daily  . mexiletine  150 mg Oral 3 times per day  . rosuvastatin  10 mg Oral q1800  . sodium chloride  3 mL Intravenous Q12H  . tamsulosin  0.4 mg Oral Daily   Zetta Bills, MD 10/10/2014, 10:55 AM

## 2014-10-10 NOTE — Clinical Documentation Improvement (Signed)
Please specify degree of documentation of obesity.  . Obesity --Morbid (severe) Due to excess calories With alveolar hypoventilation (Pickwickian syndrome) --Drug Induced Document drug --Other Due to excess calories, familial, endocrine . Overweight . Body Mass Index (BMI) . Document any associated diagnoses/conditions  Supporting Information:10/02/14 Admission  stats:  Ht: 6\' 1"                                              Wt: 325 lbs                                             BMI: 42.8 10/09/14   Wt: 304 lbs                  BMI: 40.12   Thank You,  Elpidio Anis, RN, BSN, CDI [phone] [facility]

## 2014-10-10 NOTE — Progress Notes (Signed)
Patient Name: [name]. Date of Encounter: 10/10/2014     Principal Problem:   Acute on chronic combined systolic and diastolic congestive heart failure Active Problems:   Type 2 diabetes mellitus treated with insulin   Hyperlipidemia   Obesity   HTN (hypertension)   Hypertrophic obstructive cardiomyopathy   Automatic implantable cardioverter-defibrillator in situ   Ventricular tachycardia   Acute on chronic renal insufficiency   Hyperkalemia   Mural thrombus of left ventricle   Anemia   Chronic anticoagulation   Ascites   Cirrhosis    SUBJECTIVE  Denies any SOB or CP. Continue to have runny nose and cough. Laying comfortably, watching TV  CURRENT MEDS . apixaban  5 mg Oral BID  . carvedilol  3.125 mg Oral BID WC  . dextromethorphan  30 mg Oral BID  . furosemide  120 mg Intravenous 3 times per day  . guaiFENesin  1,200 mg Oral BID  . hydrALAZINE  10 mg Oral 3 times per day  . insulin aspart  0-15 Units Subcutaneous TID WC  . insulin aspart  0-5 Units Subcutaneous QHS  . insulin glargine  50 Units Subcutaneous BID  . isosorbide mononitrate  15 mg Oral Daily  . lactulose  20 g Oral TID  . levothyroxine  187.5 mcg Oral QAC breakfast  . linagliptin  5 mg Oral Daily  . mexiletine  150 mg Oral 3 times per day  . rosuvastatin  10 mg Oral q1800  . sodium chloride  3 mL Intravenous Q12H  . tamsulosin  0.4 mg Oral Daily    OBJECTIVE  Filed Vitals:   10/09/14 1427 10/09/14 1738 10/09/14 2049 10/10/14 0547  BP: 130/52 115/50 113/48 127/61  Pulse: 63 65 68 72  Temp: 98.2 F (36.8 C)  97.7 F (36.5 C) 98 F (36.7 C)  TempSrc: Oral  Oral Oral  Resp: 24  20 20   Height:      Weight:    299 lb 12.8 oz (135.988 kg)  SpO2: 94%  98% 96%    Intake/Output Summary (Last 24 hours) at 10/10/14 0929 Last data filed at 10/10/14 0851  Gross per 24 hour  Intake 2792.07 ml  Output   2304 ml  Net 488.07 ml   Filed Weights   10/08/14 0521 10/09/14 0545 10/10/14  0547  Weight: 308 lb 4.8 oz (139.844 kg) 304 lb 0.2 oz (137.9 kg) 299 lb 12.8 oz (135.988 kg)    PHYSICAL EXAM  General: Pleasant, NAD. Neuro: Alert and oriented X 3. Moves all extremities spontaneously. Psych: Normal affect. HEENT:  Normal  Neck: Supple without bruits or JVD. Lungs:  Resp regular and unlabored, CTA. No rale, some expiratory wheezing on exam Heart: RRR no s3, s4, or murmurs. Abdomen: Soft, non-tender, non-distended, BS + x 4.  Extremities: No clubbing, cyanosis. DP/PT/Radials 2+ and equal bilaterally. 1+ bilateral LE edema  Accessory Clinical Findings  CBC  Recent Labs  10/09/14 0336 10/10/14 0423  WBC 5.3 5.2  HGB 8.6* 8.2*  HCT 27.3* 26.5*  MCV 85.3 83.9  PLT 63* 71*   Basic Metabolic Panel  Recent Labs  10/09/14 0336 10/10/14 0423  NA 142 142  K 4.3 4.2  CL 108 108  CO2 23 26  GLUCOSE 146* 104*  BUN 71* 72*  CREATININE 2.68* 2.58*  CALCIUM 9.2 9.0   Liver Function Tests No results for input(s): AST, ALT, ALKPHOS, BILITOT, PROT, ALBUMIN in the last 72 hours.  TELE Not on telemetry  ECG  No new EKG  Echocardiogram  LV EF: 25%  ------------------------------------------------------------------- Indications:   CHF - 428.0.  ------------------------------------------------------------------- Study Conclusions  - Left ventricle: There is 1.5cm x 1.5cm clot in the apex. By report, this clot has been seen in the past. The cavity size was moderately to severely dilated. Wall thickness was increased in a pattern of moderate to severe LVH. The estimated ejection fraction was 25%. Diffuse hypokinesis. Doppler parameters are consistent with high ventricular filling pressure. - Left atrium: The atrium was moderately dilated. - Right ventricle: There is some RV dysfunction. Not able to assess RV function due to poor visualization. Not able to assess RV size due to poor visualization.     Radiology/Studies  Dg  Chest 2 View  10/02/2014   CLINICAL DATA:  Increase shortness of breath for 5 days. Increasing abdominal ascites and lower extremity edema. Cough, congestion for 5 days.  EXAM: CHEST  2 VIEW  COMPARISON:  08/31/2014  FINDINGS: Cardiomegaly with vascular congestion. Left pacer is unchanged. No overt edema. Bibasilar opacities are stable in likely reflect atelectasis or scarring. No effusions or acute bony abnormality.  IMPRESSION: Mild cardiomegaly and vascular congestion. Bibasilar atelectasis or scarring. No change.   Electronically Signed   By: Charlett Nose M.D.   On: 10/02/2014 11:09   US Renal  10/02/2014   CLINICAL DATA:  Urinary retention. Worsening renal function. Evaluate for obstruction.  EXAM: RENAL/URINARY TRACT ULTRASOUND COMPLETE  COMPARISON:  CT 09/04/2014.  FINDINGS: Right Kidney:  Length: 11.7 cm. Echogenicity within normal limits. No mass or hydronephrosis visualized.  Left Kidney:  Length: 10.2 cm. Echogenicity within normal limits. No mass or hydronephrosis visualized.  Bladder:  Collapse with Foley catheter.  IMPRESSION: Normal renal ultrasound.  Negative for obstruction or stones.   Electronically Signed   By: Andreas Newport M.D.   On: 10/02/2014 13:59   US Paracentesis  10/04/2014   CLINICAL DATA:  Abdominal pain, abdominal distention. Ascites. Request diagnostic paracentesis  EXAM: ULTRASOUND GUIDED PARACENTESIS  COMPARISON:  None.  PROCEDURE: An ultrasound guided paracentesis was thoroughly discussed with the patient and questions answered. The benefits, risks, alternatives and complications were also discussed. The patient understands and wishes to proceed with the procedure. Written consent was obtained.  Ultrasound was performed to localize and mark an adequate pocket of fluid in the right lower quadrant of the abdomen. The area was then prepped and draped in the normal sterile fashion. 1% Lidocaine was used for local anesthesia. Under ultrasound guidance a 19 gauge Yueh catheter  was introduced. Paracentesis was performed. The catheter was removed and a dressing applied.  COMPLICATIONS: None immediate  FINDINGS: A total of approximately 1.5 L of cloudy, amber colored fluid was removed. A fluid sample was sent for laboratory analysis.  IMPRESSION: Successful ultrasound guided paracentesis yielding 1.5 L of ascites.  Read by: Brayton El PA-C   Electronically Signed   By: Oley Balm M.D.   On: 10/04/2014 11:09   US Abdomen Limited Ruq  10/06/2014   CLINICAL DATA:  Initial evaluation for abdominal pain, personal history of cirrhosis  EXAM: US ABDOMEN LIMITED - RIGHT UPPER QUADRANT  COMPARISON: *06/11/2013  FINDINGS: Gallbladder:  As on the prior study the gallbladder wall is about 4 mm in thickness. There is no sonographic Murphy's sign. There are echogenic foci adherent to the wall of the gallbladder measuring up to 6 mm, which are not mobile.  Common bile duct:  Diameter: 4 mm  Liver:  Heterogeneous echotexture  with nodular contour consistent with cirrhosis. There is trace fluid around the liver.  IMPRESSION: Evidence of cirrhosis, with nonspecific stable mild gallbladder wall thickening and no Murphy's sign. New from the prior study is the presence of several small echogenic foci adherent to the gallbladder wall, possibly representing complex sludge or small immobile calculi.   Electronically Signed   By: Esperanza Heir M.D.   On: 10/06/2014 16:09    ASSESSMENT AND PLAN  1. Acute on chronic combined systolic and diastolic CHF  - continue coreg, hydralazine/nitrate. unable to add ACEI or ARB given CKD  - weight down from 325 to 299, Net -3.7 L. 301 lbs on office visit with Dr. [name] in July 2015, on Dec followup, his weight increased to 326 lbs  - intake > output yesterday, and yet weight down from from 304 to 299, ?accuracy  - still has some LE edema, however likely chronic, can potentially switch to PO lasix either today vs tomorrow. Expect discharge soon.  2.  Cirrhosis with portal HTN and ascites: off amiodarone 3. Acute on chronic renal insufficiency 4. Hyperkalemia 5. Known LV thrombus: on apixaban 6. VT s/p ICD: on mexiletine 7. Hypertrophic cardiomyopathy 8. Cough: some wheezing on exam, treatment for primary team  Signed, Amedeo Plenty Pager: [phone] As above, patient seen and examined. He is improving clinically. He remains volume overloaded. I will continue Lasix 120 mg twice a day today. Transition to oral Lasix tomorrow. Follow renal function. Continue low-dose hydralazine and increase Imdur to 30 mg daily. Continue low-dose Coreg. Olga Millers

## 2014-10-11 ENCOUNTER — Other Ambulatory Visit: Payer: Self-pay | Admitting: Cardiology

## 2014-10-11 ENCOUNTER — Inpatient Hospital Stay (HOSPITAL_COMMUNITY): Payer: Medicare Other

## 2014-10-11 LAB — BASIC METABOLIC PANEL
Anion gap: 13 (ref 5–15)
BUN: 75 mg/dL — ABNORMAL HIGH (ref 6–23)
CO2: 23 mmol/L (ref 19–32)
Calcium: 9.1 mg/dL (ref 8.4–10.5)
Chloride: 105 mEq/L (ref 96–112)
Creatinine, Ser: 2.38 mg/dL — ABNORMAL HIGH (ref 0.50–1.35)
GFR calc Af Amer: 33 mL/min — ABNORMAL LOW (ref >90)
GFR, EST NON AFRICAN AMERICAN: 28 mL/min — AB (ref >90)
Glucose, Bld: 52 mg/dL — ABNORMAL LOW (ref 70–99)
Potassium: 3.7 mmol/L (ref 3.5–5.1)
SODIUM: 141 mmol/L (ref 135–145)

## 2014-10-11 LAB — GLUCOSE, CAPILLARY
GLUCOSE-CAPILLARY: 240 mg/dL — AB (ref 70–99)
GLUCOSE-CAPILLARY: 188 mg/dL — AB (ref 70–99)
Glucose-Capillary: 44 mg/dL — CL (ref 70–99)
Glucose-Capillary: 53 mg/dL — ABNORMAL LOW (ref 70–99)
Glucose-Capillary: 122 mg/dL — ABNORMAL HIGH (ref 70–99)

## 2014-10-11 MED ORDER — ISOSORBIDE MONONITRATE ER 30 MG PO TB24: 30.0000 mg | ORAL_TABLET | Freq: Every day | ORAL | Status: DC

## 2014-10-11 MED ORDER — CARVEDILOL 6.25 MG PO TABS
6.2500 mg | ORAL_TABLET | Freq: Two times a day (BID) | ORAL | Status: DC
  Filled 2014-10-11 (×2): qty 1

## 2014-10-11 MED ORDER — FUROSEMIDE 40 MG PO TABS: 120.0000 mg | ORAL_TABLET | Freq: Two times a day (BID) | ORAL | Status: DC

## 2014-10-11 MED ORDER — FUROSEMIDE 80 MG PO TABS
120.0000 mg | ORAL_TABLET | Freq: Two times a day (BID) | ORAL | Status: DC
  Administered 2014-10-11: 120 mg via ORAL
  Filled 2014-10-11 (×3): qty 1

## 2014-10-11 MED ORDER — LINAGLIPTIN 5 MG PO TABS: 5.0000 mg | ORAL_TABLET | Freq: Every day | ORAL | Status: DC

## 2014-10-11 MED ORDER — CARVEDILOL 6.25 MG PO TABS: 6.2500 mg | ORAL_TABLET | Freq: Two times a day (BID) | ORAL | Status: AC

## 2014-10-11 MED ORDER — TAMSULOSIN HCL 0.4 MG PO CAPS: 0.4000 mg | ORAL_CAPSULE | Freq: Every day | ORAL | Status: DC

## 2014-10-11 MED ORDER — HYDRALAZINE HCL 10 MG PO TABS: 10.0000 mg | ORAL_TABLET | Freq: Three times a day (TID) | ORAL | Status: DC

## 2014-10-11 MED ORDER — GUAIFENESIN-DM 100-10 MG/5ML PO SYRP: 5.0000 mL | ORAL_SOLUTION | ORAL | Status: DC | PRN

## 2014-10-11 NOTE — Progress Notes (Signed)
Pt. CBG 122. On coming RN aware. [name], [name]

## 2014-10-11 NOTE — Progress Notes (Signed)
Hypoglycemic Event  CBG: 42  Treatment: 15 GM carbohydrate snack  Symptoms: Hungry  Follow-up CBG: Time:0620 CBG Result:52  Possible Reasons for Event: Inadequate meal intake  Comments/MD notified:Monitor cbg, T. Claiborne Billings, NP    Daleen Squibb, [name]  Remember to initiate Hypoglycemia Order Set & complete

## 2014-10-11 NOTE — Progress Notes (Signed)
Discharge education completed by RN. Pt and spouse received a copy of discharge paperwork and confirm understanding of follow up appointments and discharge medications. Both deny any questions at this time. IV removed, site is within normal limits. Pt will discharge from the unit via wheelchair. 

## 2014-10-11 NOTE — Progress Notes (Signed)
Pt. With BP of 120/37. On call NP, Benedetto Coons,  made aware. Okay to give scheduled antihypertensive and Lasix. Signa Cheek, [name]

## 2014-10-11 NOTE — Discharge Summary (Signed)
Physician Discharge Summary  RENLY ROOTS Sr. ZOX:[id number] DOB: [birth year] DOA: 10/02/2014  PCP: [name], MD  Admit date: 10/02/2014 Discharge date: 10/11/2014  Time spent: 35 minutes  Recommendations for Outpatient Follow-up:  1. Please follow up on BMP on Monday Oct 14, 2014, pt instructed to present to his PCP's office.  2. Follow up on volume status, he was diuresed with IV lasix during this hospitalizaiton  Discharge Diagnoses:  Principal Problem:   Acute on chronic combined systolic and diastolic congestive heart failure Active Problems:   Type 2 diabetes mellitus treated with insulin   Hyperlipidemia   Obesity   HTN (hypertension)   Hypertrophic obstructive cardiomyopathy   Automatic implantable cardioverter-defibrillator in situ   Ventricular tachycardia   Acute on chronic renal insufficiency   Hyperkalemia   Mural thrombus of left ventricle   Anemia   Chronic anticoagulation   Ascites   Cirrhosis   Discharge Condition: Stable  Diet recommendation: Low Sodium/Fluid Restriction  Filed Weights   10/09/14 0545 10/10/14 0547 10/11/14 0502  Weight: 137.9 kg (304 lb 0.2 oz) 135.988 kg (299 lb 12.8 oz) 134 kg (295 lb 6.7 oz)    History of present illness:  TIVON LEMOINE Sr. is a 59 y.o. male  Who was recently in the hospital and discharged on 12/4 for acute on chronic systolic HF, AKI on CKD, cirrhosis. He was seen by Dr. Madilyn Fireman today who suggested he come to the ER. He was d/c'd on lasix 40 mg but that was increased to 60 mg as an outpatient. Patient has not missed any doses and his diet has not changed. Also outpatient he was taken off his amio as his cirrhosis has worsened and he was to see a liver specialist  He comes in with increased SOB that has been worsening for the last 5 days. He has noticed LE swelling and increased abdominal swelling. Unable to lay still. Has noticed he has had a harder time urinating and his urine has decreased and he feels  as if his bladder is not emptying.  In the ER, his Cr was found to be elevated. K was 6.4. DVT studies were negative in the ER- Baker's cyst in right popliteal fossa.   Hospital Course:  59 year old male who was recently in the hospital and discharged on 12/4 for acute on chronic systolic HF, AKI on CKD, cirrhosis. He was seen by Dr. Madilyn Fireman today who suggested he come to the ER. He was d/c'd on lasix 40 mg but that was increased to 60 mg as an outpatient. Patient has not missed any doses and his diet has not changed. Also outpatient he was taken off his amio as his cirrhosis has worsened and he was to see a liver specialist  He comes in with increased SOB that has been worsening for the last 5 days. He has noticed LE swelling and increased abdominal swelling. Unable to lay still. Has noticed he has had a harder time urinating and his urine has decreased and he feels as if his bladder is not emptying. In the ER, his Cr was found to be elevated. K was 6.4. DVT studies were negative in the ER- Baker's cyst in right popliteal fossa.    Since here he has been seen by cardiology and nephrology, his required IV Lasix drip for diuresis, he also underwent paracentesis for 1-1/2 L of ascites fluid removal, his SAAG was over 2. Patient having good results from Lasix drip was transitioned to IV Lasix by  cardiology. Given clinical improvement he was changed to oral Lasix on 10/11/2014. Cardiology recommending discharging him on Lasix 120 mg by mouth twice a day. His prolactin was discontinued given renal disease. Cardiology recommending patient follow-up with Dr. Leonia Reader in 1 week.  1. Acute on chronic respiratory failure secondary to acute on chronic diastolic and systolic CHF. EF 25%, has AICD, and history of nonischemic cardiomyopathy.  -Discharging patient on Lasix 120 mg by mouth twice a day -Will need repeat BMP on Monday, 10/14/2014 -Follow up of volume status  2.NSTEMI with LV  clot -Patient to be discharged on Eliquis therapy, follow-up with cardiology in 1 week  3. ARF with hyperkalemia on chronic kidney disease stage IV.  -Baseline creatinine around 2.5 -Acute on chronic renal failure resolving -On 10/11/2014 lab work showing creatinine of 2.38 with BUN of 75 -Repeat BMP on Monday, 10/14/2014  4. DM type II.  -Will discharge on Lantus 15 a simultaneous twice a day -Blood sugars controlled   5. Bladder outflow obstruction.  -Required Foley in ER -Patient discharged on tamsulosin -Outpatient urology follow-up  7. Cirrhosis -Discharging patient on diuretic therapy -Outpatient GI follow-up   Consultations:  Nephrology  Cardiology  Discharge Exam: Filed Vitals:   10/11/14 0502  BP: 120/37  Pulse: 77  Temp: 97.8 F (36.6 C)  Resp: 20    Awake Alert, Oriented X 3, No new F.N deficits, Normal affect [place].AT,PERRAL Supple Neck,No JVD, No cervical lymphadenopathy appriciated.  Symmetrical Chest wall movement, Good air movement bilaterally,  RRR,No Gallops,Rubs or new Murmurs, No Parasternal Heave +ve B.Sounds, Abd distended, No tenderness, No organomegaly appriciated, No rebound - guarding or rigidity. No Cyanosis, Clubbing No new Rash or bruise  1+ edema  Discharge Instructions   Discharge Instructions    (HEART FAILURE PATIENTS) Call MD:  Anytime you have any of the following symptoms: 1) 3 pound weight gain in 24 hours or 5 pounds in 1 week 2) shortness of breath, with or without a dry hacking cough 3) swelling in the hands, feet or stomach 4) if you have to sleep on extra pillows at night in order to breathe.    Complete by:  As directed      Call MD for:  difficulty breathing, headache or visual disturbances    Complete by:  As directed      Call MD for:  extreme fatigue    Complete by:  As directed      Call MD for:  hives    Complete by:  As directed      Call MD for:  persistant dizziness or light-headedness    Complete by:  As  directed      Call MD for:  persistant nausea and vomiting    Complete by:  As directed      Call MD for:  redness, tenderness, or signs of infection (pain, swelling, redness, odor or green/yellow discharge around incision site)    Complete by:  As directed      Call MD for:  severe uncontrolled pain    Complete by:  As directed      Call MD for:  temperature >100.4    Complete by:  As directed      Diet - low sodium heart healthy    Complete by:  As directed      Increase activity slowly    Complete by:  As directed           Current Discharge Medication List  START taking these medications   Details  guaiFENesin-dextromethorphan (ROBITUSSIN DM) 100-10 MG/5ML syrup Take 5 mLs by mouth every 4 (four) hours as needed for cough. Qty: 118 mL, Refills: 0    hydrALAZINE (APRESOLINE) 10 MG tablet Take 1 tablet (10 mg total) by mouth every 8 (eight) hours. Qty: 90 tablet, Refills: 1    isosorbide mononitrate (IMDUR) 30 MG 24 hr tablet Take 1 tablet (30 mg total) by mouth daily. Qty: 30 tablet, Refills: 1    linagliptin (TRADJENTA) 5 MG TABS tablet Take 1 tablet (5 mg total) by mouth daily. Qty: 30 tablet, Refills: 1    tamsulosin (FLOMAX) 0.4 MG CAPS capsule Take 1 capsule (0.4 mg total) by mouth daily. Qty: 30 capsule, Refills: 1      CONTINUE these medications which have CHANGED   Details  carvedilol (COREG) 6.25 MG tablet Take 1 tablet (6.25 mg total) by mouth 2 (two) times daily with a meal. Qty: 60 tablet, Refills: 1    furosemide (LASIX) 40 MG tablet Take 3 tablets (120 mg total) by mouth 2 (two) times daily. Qty: 60 tablet, Refills: 1      CONTINUE these medications which have NOT CHANGED   Details  apixaban (ELIQUIS) 5 MG TABS tablet Take 1 tablet (5 mg total) by mouth 2 (two) times daily. Qty: 60 tablet, Refills: 6    cholecalciferol (VITAMIN D) 1000 UNITS tablet Take 2,000 Units by mouth daily.     fish oil-omega-3 fatty acids 1000 MG capsule Take 1 capsule  by mouth 3 (three) times daily.     insulin glargine (LANTUS) 100 UNIT/ML injection Inject 50 Units into the skin 2 (two) times daily.  Qty: 90 mL, Refills: 3   Associated Diagnoses: Diabetes mellitus due to underlying condition with diabetic nephropathy    insulin lispro (HUMALOG KWIKPEN) 100 UNIT/ML KiwkPen Inject 16 units with largest/evening meal.Dispense quikpens Qty: 30 mL, Refills: 11   Associated Diagnoses: Diabetes    lactulose (CHRONULAC) 10 GM/15ML solution Take 30 mLs (20 g total) by mouth 3 (three) times daily. To maintain 2-3 loose bowel movements per day. Qty: 240 mL, Refills: 0    !! levothyroxine (SYNTHROID, LEVOTHROID) 150 MCG tablet Take 150 mcg by mouth daily before breakfast.    !! levothyroxine (SYNTHROID, LEVOTHROID) 75 MCG tablet TAKE 1/2 TABLET (=37.5MCG) ALONG WITH LEVOTHYROXINE TO EQUAL 187. DAILY. Qty: 30 tablet, Refills: 1    mexiletine (MEXITIL) 150 MG capsule TAKE 1 CAPSULE EVERY 8 HOURS Qty: 270 capsule, Refills: 3    rosuvastatin (CRESTOR) 20 MG tablet Take 0.5 tablets (10 mg total) by mouth daily. Qty: 28 tablet, Refills: 0     !! - Potential duplicate medications found. Please discuss with provider.    STOP taking these medications     lisinopril (PRINIVIL,ZESTRIL) 2.5 MG tablet      sitaGLIPtin (JANUVIA) 50 MG tablet      spironolactone (ALDACTONE) 100 MG tablet      WELCHOL 625 MG tablet        Allergies  Allergen Reactions  . Ace Inhibitors     AVOID ALL  BECAUSE OF HEART DISEASE   . Nitroglycerin Other (See Comments)    Reaction unknown  . Procan Sr [Procainamide Hcl]     PT. HAS HEART DISEASE CALLED HYPERTHROPHIC CARDIOMYOPATHY  . Quinidine     PT. HAS HEART DISEASE CALLED HYPERTHROPHIC CARDIOMYOPATHY     Follow-up Information    Follow up with Dagoberto Ligas., MD On 11/05/2014.  Specialty:  Nephrology   Why:  AT 1PM   Contact information:   [address] [place] [address] [phone]       Follow up with  Olga Millers, MD In 2 weeks.   Specialty:  Cardiology   Contact information:   [address] [place] [address] [phone]       Follow up with [name], MD In 2 weeks.   Specialty:  Family Medicine   Contact information:   [address] [place] [address] [phone]        The results of significant diagnostics from this hospitalization (including imaging, microbiology, ancillary and laboratory) are listed below for reference.    Significant Diagnostic Studies: Dg Chest 2 View  10/11/2014   CLINICAL DATA:  Cough for few days  EXAM: CHEST  2 VIEW  COMPARISON:  10/02/2014  FINDINGS: Cardiomegaly is noted. For leads cardiac pacemaker is unchanged in position. No acute infiltrate or pleural effusion. No pulmonary edema. Bony thorax is unremarkable.  IMPRESSION: No active cardiopulmonary disease. Stable cardiac pacemaker position.   Electronically Signed   By: Natasha Mead M.D.   On: 10/11/2014 10:39   Dg Chest 2 View  10/02/2014   CLINICAL DATA:  Increase shortness of breath for 5 days. Increasing abdominal ascites and lower extremity edema. Cough, congestion for 5 days.  EXAM: CHEST  2 VIEW  COMPARISON:  08/31/2014  FINDINGS: Cardiomegaly with vascular congestion. Left pacer is unchanged. No overt edema. Bibasilar opacities are stable in likely reflect atelectasis or scarring. No effusions or acute bony abnormality.  IMPRESSION: Mild cardiomegaly and vascular congestion. Bibasilar atelectasis or scarring. No change.   Electronically Signed   By: Charlett Nose M.D.   On: 10/02/2014 11:09   US Renal  10/02/2014   CLINICAL DATA:  Urinary retention. Worsening renal function. Evaluate for obstruction.  EXAM: RENAL/URINARY TRACT ULTRASOUND COMPLETE  COMPARISON:  CT 09/04/2014.  FINDINGS: Right Kidney:  Length: 11.7 cm. Echogenicity within normal limits. No mass or hydronephrosis visualized.  Left Kidney:  Length: 10.2 cm. Echogenicity within normal limits. No mass or  hydronephrosis visualized.  Bladder:  Collapse with Foley catheter.  IMPRESSION: Normal renal ultrasound.  Negative for obstruction or stones.   Electronically Signed   By: Andreas Newport M.D.   On: 10/02/2014 13:59   US Paracentesis  10/04/2014   CLINICAL DATA:  Abdominal pain, abdominal distention. Ascites. Request diagnostic paracentesis  EXAM: ULTRASOUND GUIDED PARACENTESIS  COMPARISON:  None.  PROCEDURE: An ultrasound guided paracentesis was thoroughly discussed with the patient and questions answered. The benefits, risks, alternatives and complications were also discussed. The patient understands and wishes to proceed with the procedure. Written consent was obtained.  Ultrasound was performed to localize and mark an adequate pocket of fluid in the right lower quadrant of the abdomen. The area was then prepped and draped in the normal sterile fashion. 1% Lidocaine was used for local anesthesia. Under ultrasound guidance a 19 gauge Yueh catheter was introduced. Paracentesis was performed. The catheter was removed and a dressing applied.  COMPLICATIONS: None immediate  FINDINGS: A total of approximately 1.5 L of cloudy, amber colored fluid was removed. A fluid sample was sent for laboratory analysis.  IMPRESSION: Successful ultrasound guided paracentesis yielding 1.5 L of ascites.  Read by: Brayton El PA-C   Electronically Signed   By: Oley Balm M.D.   On: 10/04/2014 11:09   US Abdomen Limited Ruq  10/06/2014   CLINICAL DATA:  Initial evaluation for abdominal pain, personal history of cirrhosis  EXAM: US ABDOMEN LIMITED - RIGHT UPPER QUADRANT  COMPARISON: *06/11/2013  FINDINGS: Gallbladder:  As on the prior study the gallbladder wall is about 4 mm in thickness. There is no sonographic Murphy's sign. There are echogenic foci adherent to the wall of the gallbladder measuring up to 6 mm, which are not mobile.  Common bile duct:  Diameter: 4 mm  Liver:  Heterogeneous echotexture with nodular contour  consistent with cirrhosis. There is trace fluid around the liver.  IMPRESSION: Evidence of cirrhosis, with nonspecific stable mild gallbladder wall thickening and no Murphy's sign. New from the prior study is the presence of several small echogenic foci adherent to the gallbladder wall, possibly representing complex sludge or small immobile calculi.   Electronically Signed   By: Esperanza Heir M.D.   On: 10/06/2014 16:09    Microbiology: Recent Results (from the past 240 hour(s))  Body fluid culture     Status: None   Collection Time: 10/04/14 10:12 AM  Result Value Ref Range Status   Specimen Description ASCITIC  Final   Special Requests NONE  Final   Gram Stain   Final    RARE WBC PRESENT, PREDOMINANTLY MONONUCLEAR NO ORGANISMS SEEN Performed at [facility]    Culture   Final    NO GROWTH 3 DAYS Performed at [facility]    Report Status 10/08/2014 FINAL  Final  Culture, expectorated sputum-assessment     Status: None   Collection Time: 10/10/14 11:48 AM  Result Value Ref Range Status   Specimen Description SPUTUM  Final   Special Requests NONE  Final   Sputum evaluation   Final    MICROSCOPIC FINDINGS SUGGEST THAT THIS SPECIMEN IS NOT REPRESENTATIVE OF LOWER RESPIRATORY SECRETIONS. PLEASE RECOLLECT. CALLED TO A.MOORE,RN AT 2022 BY L.PITT 10/10/14     Report Status 10/10/2014 FINAL  Final     Labs: Basic Metabolic Panel:  Recent Labs Lab 10/07/14 0400 10/08/14 0416 10/09/14 0336 10/10/14 0423 10/11/14 0446  NA 140 141 142 142 141  K 5.0 5.0 4.3 4.2 3.7  CL 109 109 108 108 105  CO2 23 22 23 26 23   GLUCOSE 120* 161* 146* 104* 52*  BUN 67* 69* 71* 72* 75*  CREATININE 2.89* 2.74* 2.68* 2.58* 2.38*  CALCIUM 9.1 9.0 9.2 9.0 9.1   Liver Function Tests:  Recent Labs Lab 10/07/14 0400  AST 87*  ALT 58*  ALKPHOS 101  BILITOT 0.4  PROT 6.5  ALBUMIN 3.0*   No results for input(s): LIPASE, AMYLASE in the last 168 hours. No results for input(s):  AMMONIA in the last 168 hours. CBC:  Recent Labs Lab 10/06/14 0400 10/07/14 0400 10/09/14 0336 10/10/14 0423  WBC 5.1 5.3 5.3 5.2  HGB 8.5* 8.4* 8.6* 8.2*  HCT 27.4* 27.3* 27.3* 26.5*  MCV 86.2 83.7 85.3 83.9  PLT 61* 69* 63* 71*   Cardiac Enzymes: No results for input(s): CKTOTAL, CKMB, CKMBINDEX, TROPONINI in the last 168 hours. BNP: BNP (last 3 results)  Recent Labs  08/31/14 1350 09/03/14 0255  PROBNP 1597.0* 1552.0*   CBG:  Recent Labs Lab 10/10/14 2116 10/11/14 0600 10/11/14 0622 10/11/14 0729 10/11/14 0950  GLUCAP 97 44* 53* 122* 240*       Signed:  Cherrie Franca  [facility] 10/11/2014, 10:53 AM

## 2014-10-11 NOTE — Progress Notes (Signed)
Patient Name: Dakota BOLSINGER Sr. Date of Encounter: 10/11/2014     Principal Problem:   Acute on chronic combined systolic and diastolic congestive heart failure Active Problems:   Type 2 diabetes mellitus treated with insulin   Hyperlipidemia   Obesity   HTN (hypertension)   Hypertrophic obstructive cardiomyopathy   Automatic implantable cardioverter-defibrillator in situ   Ventricular tachycardia   Acute on chronic renal insufficiency   Hyperkalemia   Mural thrombus of left ventricle   Anemia   Chronic anticoagulation   Ascites   Cirrhosis    SUBJECTIVE  Denies any SOB or CP.   CURRENT MEDS . apixaban  5 mg Oral BID  . carvedilol  3.125 mg Oral BID WC  . furosemide  120 mg Oral BID  . guaiFENesin  1,200 mg Oral BID  . hydrALAZINE  10 mg Oral 3 times per day  . insulin aspart  0-15 Units Subcutaneous TID WC  . insulin aspart  0-5 Units Subcutaneous QHS  . insulin glargine  50 Units Subcutaneous BID  . isosorbide mononitrate  30 mg Oral Daily  . lactulose  20 g Oral TID  . levothyroxine  187.5 mcg Oral QAC breakfast  . linagliptin  5 mg Oral Daily  . mexiletine  150 mg Oral 3 times per day  . rosuvastatin  10 mg Oral q1800  . sodium chloride  3 mL Intravenous Q12H  . tamsulosin  0.4 mg Oral Daily    OBJECTIVE  Filed Vitals:   10/10/14 1455 10/10/14 1523 10/10/14 2114 10/11/14 0502  BP: 114/49 100/44 115/52 120/37  Pulse: 61 62 64 77  Temp:   98.2 F (36.8 C) 97.8 F (36.6 C)  TempSrc:   Oral Oral  Resp:   20 20  Height:      Weight:    295 lb 6.7 oz (134 kg)  SpO2:  99% 100% 99%    Intake/Output Summary (Last 24 hours) at 10/11/14 0937 Last data filed at 10/11/14 0858  Gross per 24 hour  Intake   3574 ml  Output   1750 ml  Net   1824 ml   Filed Weights   10/09/14 0545 10/10/14 0547 10/11/14 0502  Weight: 304 lb 0.2 oz (137.9 kg) 299 lb 12.8 oz (135.988 kg) 295 lb 6.7 oz (134 kg)    PHYSICAL EXAM  General: Pleasant, NAD. Obese  Neuro:  Alert and oriented X 3. Moves all extremities spontaneously. HEENT:  Normal  Neck: Supple  Lungs:  Mildly decreased BS bases Heart: RRR  Abdomen: Soft, non-tender, non-distended Extremities:  1+ bilateral LE edema  Accessory Clinical Findings  CBC  Recent Labs  10/09/14 0336 10/10/14 0423  WBC 5.3 5.2  HGB 8.6* 8.2*  HCT 27.3* 26.5*  MCV 85.3 83.9  PLT 63* 71*   Basic Metabolic Panel  Recent Labs  10/10/14 0423 10/11/14 0446  NA 142 141  K 4.2 3.7  CL 108 105  CO2 26 23  GLUCOSE 104* 52*  BUN 72* 75*  CREATININE 2.58* 2.38*  CALCIUM 9.0 9.1    Echocardiogram  LV EF: 25%  ------------------------------------------------------------------- Indications:   CHF - 428.0.  ------------------------------------------------------------------- Study Conclusions  - Left ventricle: There is 1.5cm x 1.5cm clot in the apex. By report, this clot has been seen in the past. The cavity size was moderately to severely dilated. Wall thickness was increased in a pattern of moderate to severe LVH. The estimated ejection fraction was 25%. Diffuse hypokinesis. Doppler parameters are  consistent with high ventricular filling pressure. - Left atrium: The atrium was moderately dilated. - Right ventricle: There is some RV dysfunction. Not able to assess RV function due to poor visualization. Not able to assess RV size due to poor visualization.     Radiology/Studies  Dg Chest 2 View  10/02/2014   CLINICAL DATA:  Increase shortness of breath for 5 days. Increasing abdominal ascites and lower extremity edema. Cough, congestion for 5 days.  EXAM: CHEST  2 VIEW  COMPARISON:  08/31/2014  FINDINGS: Cardiomegaly with vascular congestion. Left pacer is unchanged. No overt edema. Bibasilar opacities are stable in likely reflect atelectasis or scarring. No effusions or acute bony abnormality.  IMPRESSION: Mild cardiomegaly and vascular congestion. Bibasilar atelectasis or  scarring. No change.   Electronically Signed   By: Charlett Nose M.D.   On: 10/02/2014 11:09   US Renal  10/02/2014   CLINICAL DATA:  Urinary retention. Worsening renal function. Evaluate for obstruction.  EXAM: RENAL/URINARY TRACT ULTRASOUND COMPLETE  COMPARISON:  CT 09/04/2014.  FINDINGS: Right Kidney:  Length: 11.7 cm. Echogenicity within normal limits. No mass or hydronephrosis visualized.  Left Kidney:  Length: 10.2 cm. Echogenicity within normal limits. No mass or hydronephrosis visualized.  Bladder:  Collapse with Foley catheter.  IMPRESSION: Normal renal ultrasound.  Negative for obstruction or stones.   Electronically Signed   By: Andreas Newport M.D.   On: 10/02/2014 13:59   US Paracentesis  10/04/2014   CLINICAL DATA:  Abdominal pain, abdominal distention. Ascites. Request diagnostic paracentesis  EXAM: ULTRASOUND GUIDED PARACENTESIS  COMPARISON:  None.  PROCEDURE: An ultrasound guided paracentesis was thoroughly discussed with the patient and questions answered. The benefits, risks, alternatives and complications were also discussed. The patient understands and wishes to proceed with the procedure. Written consent was obtained.  Ultrasound was performed to localize and mark an adequate pocket of fluid in the right lower quadrant of the abdomen. The area was then prepped and draped in the normal sterile fashion. 1% Lidocaine was used for local anesthesia. Under ultrasound guidance a 19 gauge Yueh catheter was introduced. Paracentesis was performed. The catheter was removed and a dressing applied.  COMPLICATIONS: None immediate  FINDINGS: A total of approximately 1.5 L of cloudy, amber colored fluid was removed. A fluid sample was sent for laboratory analysis.  IMPRESSION: Successful ultrasound guided paracentesis yielding 1.5 L of ascites.  Read by: Brayton El PA-C   Electronically Signed   By: Oley Balm M.D.   On: 10/04/2014 11:09   US Abdomen Limited Ruq  10/06/2014   CLINICAL DATA:   Initial evaluation for abdominal pain, personal history of cirrhosis  EXAM: US ABDOMEN LIMITED - RIGHT UPPER QUADRANT  COMPARISON: *06/11/2013  FINDINGS: Gallbladder:  As on the prior study the gallbladder wall is about 4 mm in thickness. There is no sonographic Murphy's sign. There are echogenic foci adherent to the wall of the gallbladder measuring up to 6 mm, which are not mobile.  Common bile duct:  Diameter: 4 mm  Liver:  Heterogeneous echotexture with nodular contour consistent with cirrhosis. There is trace fluid around the liver.  IMPRESSION: Evidence of cirrhosis, with nonspecific stable mild gallbladder wall thickening and no Murphy's sign. New from the prior study is the presence of several small echogenic foci adherent to the gallbladder wall, possibly representing complex sludge or small immobile calculi.   Electronically Signed   By: Esperanza Heir M.D.   On: 10/06/2014 16:09    ASSESSMENT AND PLAN  1. Acute on chronic combined systolic and diastolic CHF  - continue coreg (increase to 6.25 BID), hydralazine/nitrate. unable to add ACEI or ARB given CKD  - clinically and symptomatically improved; will plan DC on lasix 120 mg BID; spironolactone DCed given severity of renal insuff.; will need BMET Monday with results to Dr Antoine Poche; FU with Dr Antoine Poche in one week. Low na diet and fluid restriction. 2. Cirrhosis with portal HTN and ascites: off amiodarone 3. Acute on chronic renal insufficiency: will need fu with nephrology following DC 4. Hyperkalemia-resolved 5. Known LV thrombus: on apixaban 6. VT s/p ICD: on mexiletine 7. Hypertrophic cardiomyopathy  Signed, Olga Millers

## 2014-10-11 NOTE — Progress Notes (Signed)
Patient ID: Dakota Brook Sr., male   DOB: 21-Apr-1956, 59 y.o.   MRN: [id number]  [facility] Progress Note    Assessment/ Plan:   1. Acute renal failure on chronic kidney disease: Good urine output overnight, stable electrolytes and without any acute dialysis needs. Continues to show good response to diuretic therapy-convert to oral furosemide 120 mg twice a day today. Will set up for follow-up with me in one month. 2. Volume overload/CHF decompensation: Responding well to diuretic therapy, able to ambulate hallways without much assistance-reports significant improvement of edema 3. Cirrhosis/portal hypertension/ascites: Recently underwent low-volume paracentesis and remains on lactulose 4. LV thrombus: Remains on Apixiban 5. Chronic systolic heart failure: Ongoing diuretic therapy-further management per cardiology  Subjective:   Reports to be feeling better-able to ambulate hallways    Objective:   BP 120/37 mmHg  Pulse 77  Temp(Src) 97.8 F (36.6 C) (Oral)  Resp 20  Ht 6\' 1"  (1.854 m)  Wt 134 kg (295 lb 6.7 oz)  BMI 38.98 kg/m2  SpO2 99%  Intake/Output Summary (Last 24 hours) at 10/11/14 1000 Last data filed at 10/11/14 0858  Gross per 24 hour  Intake   3454 ml  Output   1750 ml  Net   1704 ml   Weight change: -1.988 kg (-4 lb 6.1 oz)  Physical Exam: Gen: Comfortably resting in bed CVS: Pulse regular in rate and rhythm Resp: Clear to auscultation, no rales Abd: Soft, obese, nontender Ext: 2+ lower extremity edema  Imaging: No results found.  Labs: BMET  Recent Labs Lab 10/05/14 0306 10/06/14 0400 10/07/14 0400 10/08/14 0416 10/09/14 0336 10/10/14 0423 10/11/14 0446  NA 141 140 140 141 142 142 141  K 5.9* 5.4* 5.0 5.0 4.3 4.2 3.7  CL 113* 112 109 109 108 108 105  CO2 21 20 23 22 23 26 23   GLUCOSE 173* 124* 120* 161* 146* 104* 52*  BUN 61* 64* 67* 69* 71* 72* 75*  CREATININE 2.64* 2.73* 2.89* 2.74* 2.68* 2.58* 2.38*  CALCIUM 9.4 9.0 9.1  9.0 9.2 9.0 9.1   CBC  Recent Labs Lab 10/06/14 0400 10/07/14 0400 10/09/14 0336 10/10/14 0423  WBC 5.1 5.3 5.3 5.2  HGB 8.5* 8.4* 8.6* 8.2*  HCT 27.4* 27.3* 27.3* 26.5*  MCV 86.2 83.7 85.3 83.9  PLT 61* 69* 63* 71*    Medications:    . apixaban  5 mg Oral BID  . carvedilol  6.25 mg Oral BID WC  . furosemide  120 mg Oral BID  . guaiFENesin  1,200 mg Oral BID  . hydrALAZINE  10 mg Oral 3 times per day  . insulin aspart  0-15 Units Subcutaneous TID WC  . insulin aspart  0-5 Units Subcutaneous QHS  . insulin glargine  50 Units Subcutaneous BID  . isosorbide mononitrate  30 mg Oral Daily  . lactulose  20 g Oral TID  . levothyroxine  187.5 mcg Oral QAC breakfast  . linagliptin  5 mg Oral Daily  . mexiletine  150 mg Oral 3 times per day  . rosuvastatin  10 mg Oral q1800  . sodium chloride  3 mL Intravenous Q12H  . tamsulosin  0.4 mg Oral Daily   Zetta Bills, MD 10/11/2014, 10:00 AM

## 2014-10-14 ENCOUNTER — Encounter: Payer: Self-pay | Admitting: Family Medicine

## 2014-10-14 ENCOUNTER — Ambulatory Visit (INDEPENDENT_AMBULATORY_CARE_PROVIDER_SITE_OTHER): Payer: BLUE CROSS/BLUE SHIELD | Admitting: Family Medicine

## 2014-10-14 ENCOUNTER — Other Ambulatory Visit: Payer: Self-pay | Admitting: *Deleted

## 2014-10-14 VITALS — BP 98/56 | HR 78 | Temp 97.5°F | Ht 73.0 in | Wt 298.4 lb

## 2014-10-14 DIAGNOSIS — D649 Anemia, unspecified: Secondary | ICD-10-CM | POA: Insufficient documentation

## 2014-10-14 DIAGNOSIS — E119 Type 2 diabetes mellitus without complications: Secondary | ICD-10-CM

## 2014-10-14 DIAGNOSIS — I5022 Chronic systolic (congestive) heart failure: Secondary | ICD-10-CM

## 2014-10-14 DIAGNOSIS — I10 Essential (primary) hypertension: Secondary | ICD-10-CM

## 2014-10-14 DIAGNOSIS — Z794 Long term (current) use of insulin: Secondary | ICD-10-CM

## 2014-10-14 LAB — POCT CBC
Granulocyte percent: 71.8 %G (ref 37–80)
HCT, POC: 30.3 % — AB (ref 43.5–53.7)
HEMOGLOBIN: 9 g/dL — AB (ref 14.1–18.1)
Lymph, poc: 1.4 (ref 0.6–3.4)
MCH, POC: 24.4 pg — AB (ref 27–31.2)
MCHC: 29.7 g/dL — AB (ref 31.8–35.4)
MCV: 82 fL (ref 80–97)
MPV: 8.5 fL (ref 0–99.8)
POC GRANULOCYTE: 4.5 (ref 2–6.9)
POC LYMPH PERCENT: 21.9 %L (ref 10–50)
Platelet Count, POC: 89 10*3/uL — AB (ref 142–424)
RBC: 3.7 M/uL — AB (ref 4.69–6.13)
RDW, POC: 17.2 %
WBC: 6.2 10*3/uL (ref 4.6–10.2)

## 2014-10-14 MED ORDER — ROSUVASTATIN CALCIUM 20 MG PO TABS: 10.0000 mg | ORAL_TABLET | Freq: Every day | ORAL | Status: DC

## 2014-10-14 MED ORDER — ROSUVASTATIN CALCIUM 20 MG PO TABS: 10.0000 mg | ORAL_TABLET | Freq: Every day | ORAL | Status: AC

## 2014-10-14 NOTE — Progress Notes (Signed)
   Subjective:    Patient ID: Dakota Royal Sr., male    DOB: April 21, 1956, 59 y.o.   MRN: [id number]  HPI Pt is here today for a hospital follow up for acute systolic and diastolic congestive heart failure, chronic kidney disease and cirrhosis. He was released 3 days ago after diuresis with IV and PO lasix. Symptoms of dyspnea and edema were markedly improved. At DC his MD told him his renal function was normal. He is a diabetic on multiple injections of insulin. He states that the glucose will drop sometimes so he will skip a dose. He denies dizziness, nausea or obtundation due to low glucose recently. He is under the care of cardiology due to multiple previous MI. HE had congenital Hypertrophic Cardiomyopathy. First MI at age 42. States he has beenin cardiac arrest 30+times.  Review of Systems  Constitutional: Negative for fever, chills, diaphoresis and unexpected weight change.  HENT: Negative for congestion, hearing loss, rhinorrhea, sore throat and trouble swallowing.   Respiratory: Positive for shortness of breath. Negative for chest tightness.        Dyspnea improved during hospitalization. Still some DOE  Cardiovascular: Positive for leg swelling. Negative for chest pain and palpitations.  Gastrointestinal: Negative for nausea, vomiting, abdominal pain, diarrhea, constipation and abdominal distention.  Endocrine: Negative for cold intolerance and heat intolerance.  Genitourinary: Negative for dysuria, hematuria and flank pain.  Musculoskeletal: Negative for joint swelling and arthralgias.  Skin: Negative for rash.  Neurological: Negative for dizziness and headaches.  Psychiatric/Behavioral: Negative for dysphoric mood, decreased concentration and agitation. The patient is not nervous/anxious.        Objective:   Physical Exam  Constitutional: He is oriented to person, place, and time. He appears well-developed and well-nourished.  HENT:  Head: Normocephalic and atraumatic.    Mouth/Throat: Oropharynx is clear and moist.  Eyes: EOM are normal. Pupils are equal, round, and reactive to light.  Neck: Normal range of motion. No tracheal deviation present. No thyromegaly present.  Cardiovascular: Normal rate, regular rhythm and normal heart sounds.  Exam reveals no gallop and no friction rub.   No murmur heard. Pulmonary/Chest: Breath sounds normal. He has no wheezes. He has no rales.  Abdominal: Soft. He exhibits no mass. There is no tenderness.  Musculoskeletal: Normal range of motion. He exhibits edema.  Neurological: He is alert and oriented to person, place, and time.  Skin: Skin is warm and dry.  Psychiatric: He has a normal mood and affect.    BP 98/56 mmHg  Pulse 78  Temp(Src) 97.5 F (36.4 C) (Oral)  Ht $R'6\' 1"'EP$  (1.854 m)  Wt 298 lb 6.4 oz (135.353 kg)  BMI 39.38 kg/m2       Assessment & Plan:  Chronic systolic heart failure - Plan: CMP14+EGFR, POCT CBC  Essential hypertension - Plan: CMP14+EGFR  Type 2 diabetes mellitus treated with insulin  Anemia, unspecified anemia type

## 2014-10-15 ENCOUNTER — Telehealth: Payer: Self-pay | Admitting: *Deleted

## 2014-10-15 LAB — CMP14+EGFR
ALBUMIN: 3.4 g/dL — AB (ref 3.5–5.5)
ALT: 75 IU/L — ABNORMAL HIGH (ref 0–44)
AST: 110 IU/L — ABNORMAL HIGH (ref 0–40)
Albumin/Globulin Ratio: 1.1 (ref 1.1–2.5)
Alkaline Phosphatase: 117 IU/L (ref 39–117)
BUN/Creatinine Ratio: 32 — ABNORMAL HIGH (ref 9–20)
BUN: 74 mg/dL — AB (ref 6–24)
CO2: 27 mmol/L (ref 18–29)
CREATININE: 2.31 mg/dL — AB (ref 0.76–1.27)
Calcium: 9.1 mg/dL (ref 8.7–10.2)
Chloride: 100 mmol/L (ref 97–108)
GFR calc Af Amer: 35 mL/min/{1.73_m2} — ABNORMAL LOW (ref >59)
GFR calc non Af Amer: 30 mL/min/{1.73_m2} — ABNORMAL LOW (ref >59)
GLUCOSE: 153 mg/dL — AB (ref 65–99)
Globulin, Total: 3.1 g/dL (ref 1.5–4.5)
Potassium: 4.2 mmol/L (ref 3.5–5.2)
Sodium: 142 mmol/L (ref 134–144)
TOTAL PROTEIN: 6.5 g/dL (ref 6.0–8.5)
Total Bilirubin: 0.5 mg/dL (ref 0.0–1.2)

## 2014-10-15 NOTE — Telephone Encounter (Signed)
Pt's wife notified of results Verbalizes understanding Labs will be added per Morrie Sheldon

## 2014-10-15 NOTE — Telephone Encounter (Signed)
-----   Message from Mechele Claude, MD sent at 10/14/2014  3:53 PM EST ----- Tell pt. He has moderately severe anemia. He should be taking iron. I prefer OTC ferrous fumarate, 106 mg of iron BID. See if lab can add TIBC, ferritin

## 2014-10-16 LAB — IRON AND TIBC
IRON SATURATION: 9 % — AB (ref 15–55)
IRON: 29 ug/dL — AB (ref 40–155)
TIBC: 310 ug/dL (ref 250–450)
UIBC: 281 ug/dL (ref 150–375)

## 2014-10-16 LAB — SPECIMEN STATUS REPORT

## 2014-10-16 LAB — FERRITIN: FERRITIN: 71 ng/mL (ref 30–400)

## 2014-10-17 ENCOUNTER — Ambulatory Visit (INDEPENDENT_AMBULATORY_CARE_PROVIDER_SITE_OTHER): Payer: BLUE CROSS/BLUE SHIELD | Admitting: Nurse Practitioner

## 2014-10-17 ENCOUNTER — Other Ambulatory Visit: Payer: Self-pay | Admitting: Nurse Practitioner

## 2014-10-17 ENCOUNTER — Telehealth: Payer: Self-pay | Admitting: Nurse Practitioner

## 2014-10-17 ENCOUNTER — Encounter: Payer: Self-pay | Admitting: Nurse Practitioner

## 2014-10-17 VITALS — BP 130/70 | HR 84 | Ht 73.0 in | Wt 296.7 lb

## 2014-10-17 DIAGNOSIS — N184 Chronic kidney disease, stage 4 (severe): Secondary | ICD-10-CM | POA: Insufficient documentation

## 2014-10-17 DIAGNOSIS — I429 Cardiomyopathy, unspecified: Secondary | ICD-10-CM | POA: Diagnosis not present

## 2014-10-17 DIAGNOSIS — I472 Ventricular tachycardia, unspecified: Secondary | ICD-10-CM

## 2014-10-17 DIAGNOSIS — I5022 Chronic systolic (congestive) heart failure: Secondary | ICD-10-CM

## 2014-10-17 DIAGNOSIS — I428 Other cardiomyopathies: Secondary | ICD-10-CM | POA: Insufficient documentation

## 2014-10-17 DIAGNOSIS — I10 Essential (primary) hypertension: Secondary | ICD-10-CM | POA: Insufficient documentation

## 2014-10-17 DIAGNOSIS — I513 Intracardiac thrombosis, not elsewhere classified: Secondary | ICD-10-CM | POA: Insufficient documentation

## 2014-10-17 DIAGNOSIS — I213 ST elevation (STEMI) myocardial infarction of unspecified site: Secondary | ICD-10-CM

## 2014-10-17 LAB — BASIC METABOLIC PANEL
BUN: 80 mg/dL — AB (ref 6–23)
CO2: 28 meq/L (ref 19–32)
CREATININE: 2.82 mg/dL — AB (ref 0.50–1.35)
Calcium: 9 mg/dL (ref 8.4–10.5)
Chloride: 100 mEq/L (ref 96–112)
Glucose, Bld: 157 mg/dL — ABNORMAL HIGH (ref 70–99)
Potassium: 4 mEq/L (ref 3.5–5.3)
Sodium: 138 mEq/L (ref 135–145)

## 2014-10-17 MED ORDER — APIXABAN 5 MG PO TABS: 5.0000 mg | ORAL_TABLET | Freq: Two times a day (BID) | ORAL | Status: AC

## 2014-10-17 NOTE — Patient Instructions (Signed)
Ward Givens, NP has ordered the following test(s) to be done: 1. Blood work - to be done TODAY  Thayer Ohm wants you to follow-up in 4-6 weeks with Dr Antoine Poche in [place]. You will receive a reminder letter in the mail one months in advance. If you don't receive a letter, please call our office to schedule the follow-up appointment.

## 2014-10-17 NOTE — Telephone Encounter (Signed)
Pt's wife called in wanting to know what the outcome was with the pt receiving his Eliquis. I believe has had a concern about the out of pocket cost for the medication. Please call  thanks

## 2014-10-17 NOTE — Telephone Encounter (Signed)
I called and spoke with patient's wife to indicate that prescription for Eliquis has been approved through [facility] until 10/17/2015. The case number is [id number]. I also sent in a new prescription for eliquis 5 mg 1 by mouth twice a day #90 with 3 refills to [facility] today sure that this gets mailed out for them. Patient's wife verbalized understanding and was grateful for the call back.

## 2014-10-17 NOTE — Progress Notes (Signed)
Patient Name: Dakota PIESKE Sr. Date of Encounter: 10/17/2014  Primary Care Provider:  [name], MD Primary Cardiologist:  J. Hochrein, MD / G. [name], MD   Patient Profile  59 y/o male with a h/o NICM who was recently admitted for acute CHF exacerbation.  Problem List   Past Medical History  Diagnosis Date  . HYPERLIPIDEMIA-MIXED   . Obesity, unspecified   . HYPERTENSION, UNSPECIFIED   . CAD (coronary artery disease)     a. Nonobstructive moderate CAD by cath - last 2012.  [name] Hypertrophic obstructive cardiomyopathy   . Complete heart block     a. s/p pacemaker later upgraded to CRT-D (MDT).  . CKD (chronic kidney disease), stage IV     a. Baseline Cr 1.4-1.5 prior to 09/2014-->2.3-2.8 during admission 09/2014.  [name] Chronic systolic CHF (congestive heart failure)     a. NICM (EF 25% in 2012, 20-25% in 09/2013). b. s/p BiV-ICD (MDT);  c. 10/2014 EF 25%.  . Colon polyps   . Ventricular tachycardia     a. EPS/RFCA of VT in 2012. b. On amio, mexilitene. c. Last BiV-ICD gen change 05/2013 (MDT).  . Hypothyroidism   . Fatty liver disease, nonalcoholic   . BPH (benign prostatic hyperplasia)   . Habitual alcohol use   . Liver cirrhosis   . 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 DM     insulin dependent   . Left ventricular apical thrombus     a. 10/2014 Echo: 1.5x1.5 cm apical clot-->on eliquis.  [name] NICM (nonischemic cardiomyopathy)     a. 10/2014 Echo: EF 25%, diff HK, 1.5 x 1.5 cm apical clot, mod dil LA.   Past Surgical History  Procedure Laterality Date  . Pacemaker insertion      medtronic  . Cardiac catheterization    . Facial reconstruction surgery      after trauma in 2006  . Bi-ventricular implantable cardioverter defibrillator N/A 05/25/2013    Procedure: BI-VENTRICULAR IMPLANTABLE CARDIOVERTER DEFIBRILLATOR  (CRT-D);  Surgeon: Marinus Maw, MD;  Location: [facility] CATH LAB;  Service: Cardiovascular;  Laterality: N/A;  . Back surgery        Allergies  Allergies  Allergen Reactions  . Ace Inhibitors     AVOID ALL  BECAUSE OF HEART DISEASE   . Nitroglycerin Other (See Comments)    Reaction unknown  . Procan Sr [Procainamide Hcl]     PT. HAS HEART DISEASE CALLED HYPERTHROPHIC CARDIOMYOPATHY  . Quinidine     PT. HAS HEART DISEASE CALLED HYPERTHROPHIC CARDIOMYOPATHY      HPI  59 y/o male with the above complex problem list including NICM, chronic systolic CHF, VT s/p RFCA and AICD, morbid obesity, CKD III-IV, and hepatic cirrhosis.  He was recently admitted to [facility] with abd bloating and volume overload.  He was noted to have renal failure on admission and was seen by nephrology.  He diuresed down from 332 lbs to 297 with clinical improvement.  He was sent home on lasix 120 mg BID w/ a d/c creat of 2.31.  Since d/c, he has been weighing himself daily and has been between 290 to 292 lbs on his home scale.  He has been compliant with his meds and has not had any pnd, orthopnea, n, v, dizziness, syncope, early satietly, or edema.  Abd bloating has resolved.  He does have chronic DOE though this has returned to prior baseline levels.    Home Medications  Prior to  Admission medications   Medication Sig Start Date End Date Taking? Authorizing Provider  apixaban (ELIQUIS) 5 MG TABS tablet Take 1 tablet (5 mg total) by mouth 2 (two) times daily. 09/23/14  Yes Marinus Maw, MD  carvedilol (COREG) 6.25 MG tablet Take 1 tablet (6.25 mg total) by mouth 2 (two) times daily with a meal. 10/11/14  Yes Jeralyn Bennett, MD  cholecalciferol (VITAMIN D) 1000 UNITS tablet Take 2,000 Units by mouth daily.    Yes Historical Provider, MD  ferrous sulfate 325 (65 FE) MG tablet Take 325 mg by mouth daily with breakfast.   Yes Historical Provider, MD  fish oil-omega-3 fatty acids 1000 MG capsule Take 1 capsule by mouth 3 (three) times daily.    Yes Historical Provider, MD  furosemide (LASIX) 40 MG tablet Take 3 tablets (120 mg total) by mouth 2 (two)  times daily. 10/11/14  Yes Jeralyn Bennett, MD  guaiFENesin-dextromethorphan (ROBITUSSIN DM) 100-10 MG/5ML syrup Take 5 mLs by mouth every 4 (four) hours as needed for cough. 10/11/14  Yes Jeralyn Bennett, MD  hydrALAZINE (APRESOLINE) 10 MG tablet Take 1 tablet (10 mg total) by mouth every 8 (eight) hours. 10/11/14  Yes Jeralyn Bennett, MD  insulin glargine (LANTUS) 100 UNIT/ML injection Inject 50 Units into the skin 2 (two) times daily.  07/25/14  Yes Tammy Eckard, PHARMD  insulin lispro (HUMALOG KWIKPEN) 100 UNIT/ML KiwkPen Inject 16 units with largest/evening meal.Dispense quikpens Patient taking differently: Inject 16 units with largest/evening meal.Dispense quikpens 02/28/14  Yes Ernestina Penna, MD  isosorbide mononitrate (IMDUR) 30 MG 24 hr tablet Take 1 tablet (30 mg total) by mouth daily. 10/11/14  Yes Jeralyn Bennett, MD  lactulose (CHRONULAC) 10 GM/15ML solution Take 30 mLs (20 g total) by mouth 3 (three) times daily. To maintain 2-3 loose bowel movements per day. 09/06/14  Yes Maryann Mikhail, DO  levothyroxine (SYNTHROID, LEVOTHROID) 150 MCG tablet Take 150 mcg by mouth daily before breakfast.   Yes Historical Provider, MD  levothyroxine (SYNTHROID, LEVOTHROID) 75 MCG tablet TAKE 1/2 TABLET (=37.5MCG) ALONG WITH LEVOTHYROXINE TO EQUAL 187. DAILY. Patient taking differently: TAKE 1/2 TABLET (=37.5MCG) BY MOUTH ALONG WITH LEVOTHYROXINE TO EQUAL 187. DAILY. 08/05/14  Yes Ernestina Penna, MD  linagliptin (TRADJENTA) 5 MG TABS tablet Take 1 tablet (5 mg total) by mouth daily. 10/11/14  Yes Jeralyn Bennett, MD  mexiletine (MEXITIL) 150 MG capsule TAKE 1 CAPSULE EVERY 8 HOURS Patient taking differently: TAKE 1 CAPSULE BY MOUTH EVERY 8 HOURS 02/28/14  Yes Ernestina Penna, MD  rosuvastatin (CRESTOR) 20 MG tablet Take 0.5 tablets (10 mg total) by mouth daily. 10/14/14  Yes Ernestina Penna, MD  tamsulosin (FLOMAX) 0.4 MG CAPS capsule Take 1 capsule (0.4 mg total) by mouth daily. 10/11/14  Yes  Jeralyn Bennett, MD    Review of Systems  Overall doing well.  Still has chronic DOE but overall improved.  He denies chest pain, palpitations, pnd, orthopnea, n, v, dizziness, syncope, edema, weight gain, or early satiety.  All other systems reviewed and are otherwise negative except as noted above.  Physical Exam  Blood pressure 130/70, pulse 84, height  (1.854 m), weight 296 lb 11.2 oz (134.582 kg).  General: Pleasant, NAD Psych: Normal affect. Neuro: Alert and oriented X 3. Moves all extremities spontaneously. HEENT: Normal  Neck: Supple without bruits.  Obese - difficult to gauge JVP. Lungs:  Resp regular and unlabored, CTA. Heart: RRR no s3, s4, soft systolic murmur @ rusb. Abdomen: obese,  protuberant, non-tender, BS + x 4.  Extremities: No clubbing, cyanosis or edema. DP/PT/Radials 1+ and equal bilaterally.  Trace bilat LEE.  Accessory Clinical Findings  ECG - A sensed, V paced, 84.  Assessment & Plan  1.  NICM/Chronic systolic CHF:  S/p recent admission with ~ 35 lbs diuresis.  Wt has been stable @ home associated with clinical improvement.  He does have chronic DOE although this has returned to prior baseline levels.  He is not having any abd bloating, pnd, orthopnea, or early satiety.  He remains on bb, hydral, nitrate, and lasix 120 mg BID.  No acei/arb/arni in setting of CKD IV.  As he had acute on chronic renal failure during his hospitalization, I will check a bmet today.  We discussed the importance of daily weights, sodium restriction, medication compliance, and symptom reporting and he verbalizes understanding.   2.  LV apical thrombus:  Now on eliquis and tolerating well.  He received a letter from [facility] stating that they would no longer cover after 10/04/2014.  I called [facility] and after 52 minutes on the phone, I was able to get eliquis 5 mg bid approved for him until 10/17/2015 - case # [id number].  3.  VT:  S/p RFCA, BiV ICD and on bb and  mexiletine.  Has f/u with Dr. [name] in march.  4.  HTN:  Stable.  5.  DM:  On multiple meds.  Followed by PCP.  6.  ETOH Abuse/hepatic cirrhosis: cessation advised.  Abdominal distention improved.  7.  CKD IV: f/u bmet today.  Has f/u with [facility] in Feb.  8.  Dispo:  F/U BMET today.  F/U Dr. Antoine Poche in [place] in 4-6 wks.  Nicolasa Ducking, NP 10/17/2014, 12:39 PM

## 2014-10-18 ENCOUNTER — Telehealth: Payer: Self-pay

## 2014-10-18 DIAGNOSIS — N184 Chronic kidney disease, stage 4 (severe): Secondary | ICD-10-CM

## 2014-10-18 NOTE — Telephone Encounter (Signed)
Called patient to notify him of results and spoke with patient's wife. Notified her of Gilford Raid instructions to decrease lasix to 3 tabs in the morning and 2 tabs in the evening and to recheck bmet in 1 week. Patient's wife stated he would rather have blood work done at [facility] rather than the [facility] lab in [place].  I will call [facility] in [place] on Monday to take care of this.

## 2014-10-18 NOTE — Telephone Encounter (Signed)
-----   Message from Ok Anis, NP sent at 10/18/2014  3:34 PM EST ----- His BUN, creat, and bicarb are up a little since d/c.  He said that his weight has continued to come down and I thought that we might find that he is getting a little dry and his labs confirm this. Please have him reduce his lasix to 120mg  in the AM and 80 mg in the PM.  He should have a f/u BMET in a week.

## 2014-10-21 ENCOUNTER — Telehealth: Payer: Self-pay | Admitting: Cardiology

## 2014-10-21 ENCOUNTER — Encounter: Payer: Medicare Other | Admitting: *Deleted

## 2014-10-21 NOTE — Telephone Encounter (Signed)
Called [facility] this morning, he can have the blood work done there. Will fax lab slip to number provided [phone]. Called patient's wife and notified her of this information and that he needed to go to [facility] lab on Thursday, 10/24/2014, to have the blood work done. Patient's wife understood and agreed with plan.

## 2014-10-21 NOTE — Telephone Encounter (Signed)
Attempted to confirm remote transmission with pt. No answer and was unable to leave a message.   

## 2014-10-22 ENCOUNTER — Encounter: Payer: Self-pay | Admitting: Cardiology

## 2014-10-24 ENCOUNTER — Other Ambulatory Visit (INDEPENDENT_AMBULATORY_CARE_PROVIDER_SITE_OTHER): Payer: BLUE CROSS/BLUE SHIELD

## 2014-10-24 DIAGNOSIS — N184 Chronic kidney disease, stage 4 (severe): Secondary | ICD-10-CM

## 2014-10-24 NOTE — Progress Notes (Signed)
Lab work for Dr Ok Anis

## 2014-10-25 LAB — BMP8+EGFR
BUN / CREAT RATIO: 29 — AB (ref 9–20)
BUN: 76 mg/dL (ref 6–24)
CO2: 26 mmol/L (ref 18–29)
Calcium: 9.5 mg/dL (ref 8.7–10.2)
Chloride: 99 mmol/L (ref 97–108)
Creatinine, Ser: 2.59 mg/dL — ABNORMAL HIGH (ref 0.76–1.27)
GFR calc Af Amer: 30 mL/min/{1.73_m2} — ABNORMAL LOW (ref >59)
GFR calc non Af Amer: 26 mL/min/{1.73_m2} — ABNORMAL LOW (ref >59)
Glucose: 256 mg/dL — ABNORMAL HIGH (ref 65–99)
POTASSIUM: 4.2 mmol/L (ref 3.5–5.2)
Sodium: 141 mmol/L (ref 134–144)

## 2014-10-28 ENCOUNTER — Encounter: Payer: Self-pay | Admitting: Family Medicine

## 2014-10-28 ENCOUNTER — Ambulatory Visit (INDEPENDENT_AMBULATORY_CARE_PROVIDER_SITE_OTHER): Payer: BLUE CROSS/BLUE SHIELD | Admitting: Family Medicine

## 2014-10-28 VITALS — BP 122/73 | HR 92 | Temp 97.1°F | Ht 73.0 in | Wt 293.0 lb

## 2014-10-28 DIAGNOSIS — E119 Type 2 diabetes mellitus without complications: Secondary | ICD-10-CM

## 2014-10-28 DIAGNOSIS — R062 Wheezing: Secondary | ICD-10-CM

## 2014-10-28 DIAGNOSIS — I5022 Chronic systolic (congestive) heart failure: Secondary | ICD-10-CM

## 2014-10-28 DIAGNOSIS — N184 Chronic kidney disease, stage 4 (severe): Secondary | ICD-10-CM

## 2014-10-28 DIAGNOSIS — R799 Abnormal finding of blood chemistry, unspecified: Secondary | ICD-10-CM

## 2014-10-28 DIAGNOSIS — D649 Anemia, unspecified: Secondary | ICD-10-CM

## 2014-10-28 LAB — POCT CBC
Granulocyte percent: 74 %G (ref 37–80)
HCT, POC: 33.2 % — AB (ref 43.5–53.7)
HEMOGLOBIN: 10.1 g/dL — AB (ref 14.1–18.1)
Lymph, poc: 1.1 (ref 0.6–3.4)
MCH: 25.1 pg — AB (ref 27–31.2)
MCHC: 30.4 g/dL — AB (ref 31.8–35.4)
MCV: 82.6 fL (ref 80–97)
MPV: 8.6 fL (ref 0–99.8)
POC Granulocyte: 4.1 (ref 2–6.9)
POC LYMPH %: 18.8 % (ref 10–50)
Platelet Count, POC: 68 10*3/uL — AB (ref 142–424)
RBC: 4 M/uL — AB (ref 4.69–6.13)
RDW, POC: 18 %
WBC: 5.6 10*3/uL (ref 4.6–10.2)

## 2014-10-28 NOTE — Patient Instructions (Signed)
Use sample inhaler, Symbicort 2 puffs twice a day and rinse mouth after using Make sure that you take your iron medication with food. If he still having nausea reduce the amount of iron to 1 tablet 3 times daily with food and see if this helps the nausea. Continue to weigh yourself daily Watch your sodium intake Keep follow-up appointments with cardiologist and nephrologist Call us sooner if any increase in fever or sputum production Take Mucinex maximum strength, blue and white in color, 1 tablet twice a day with water Use cool mist humidification keep the house as cool as possible

## 2014-10-28 NOTE — Progress Notes (Signed)
Subjective:    Patient ID: [name], male    DOB: June 30, 1956, 59 y.o.   MRN: [id number]  HPI Patient here today for follow up on recent lab levels. He is accompanied today by his wife. As a summary, the patient was recently discharged from the hospital the end of December with acute on chronic congestive heart failure, acute kidney injury and insulin-dependent diabetes. He was diuresed from 332 pounds down to 297 pounds he was asked to continue to do daily weights at home and to avoid sodium intake. He has an appointment with his cardiologist in February and an appointment with electrophysiologist in March. He also has an appointment toward the middle of February with the nephrologist. His most recent creatinine was 2.59. His blood sugar most recently was 256 and this was on January 21. The hemoglobin when last checked was 9.0. The patient tells me he is feeling well. Wife is in his presence when he is saying this. He is taking iron 2 tablets twice a day 65 mg.           Patient Active Problem List   Diagnosis Date Noted  . NICM (nonischemic cardiomyopathy)   . Left ventricular apical thrombus   . Chronic systolic CHF (congestive heart failure)   . Essential hypertension   . CKD (chronic kidney disease), stage IV   . Absolute anemia 10/14/2014  . Cirrhosis   . Chronic anticoagulation 10/07/2014  . Ascites   . Acute on chronic combined systolic and diastolic congestive heart failure   . AKI (acute kidney injury) 10/02/2014  . SOB (shortness of breath) 10/02/2014  . Leg swelling 10/02/2014  . 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 Acute renal failure syndrome   . Bronchospasm, acute   . Congestive heart disease   . Wheezing 08/31/2014  . Hypothermia 08/31/2014  . Chronic systolic heart failure 76/72/0947  . Hypoglycemia 08/31/2014  . Anemia 08/31/2014  . Thrombocytopenia 08/31/2014  . High risk medication use 10/29/2013  . Mural thrombus of left ventricle 10/08/2013  .  Acute respiratory failure 10/01/2013  . Sepsis 09/29/2013  . Acute on chronic renal insufficiency 09/29/2013  . Cardiomyopathy 09/29/2013  . Type 2 diabetes mellitus with renal manifestations, controlled 09/29/2013  . Metabolic acidosis 09/62/8366  . Hyperkalemia 09/29/2013  . Hypothyroidism (acquired) 04/09/2013  . Ventricular tachycardia 08/12/2011  . Obesity 01/07/2010  . Disorder resulting from impaired renal function 01/07/2010  . Automatic implantable cardioverter-defibrillator in situ 06/10/2009  . Type 2 diabetes mellitus treated with insulin 01/16/2009  . Hyperlipidemia 01/16/2009  . HTN (hypertension) 01/16/2009  . MYOCARDIAL INFARCTION 01/16/2009  . Hypertrophic obstructive cardiomyopathy 01/16/2009  . CARDIOMYOPATHY, SECONDARY 01/16/2009  . AV BLOCK, COMPLETE 01/16/2009  . BRADYCARDIA 01/16/2009   Outpatient Encounter Prescriptions as of 10/28/2014  Medication Sig  . apixaban (ELIQUIS) 5 MG TABS tablet Take 1 tablet (5 mg total) by mouth 2 (two) times daily.  . carvedilol (COREG) 6.25 MG tablet Take 1 tablet (6.25 mg total) by mouth 2 (two) times daily with a meal.  . cholecalciferol (VITAMIN D) 1000 UNITS tablet Take 2,000 Units by mouth daily.   . ferrous sulfate 325 (65 FE) MG tablet Take 325 mg by mouth daily with breakfast. 2 tabs bid  . fish oil-omega-3 fatty acids 1000 MG capsule Take 1 capsule by mouth 3 (three) times daily.   . furosemide (LASIX) 40 MG tablet Take 3 tablets (120 mg total) by mouth 2 (two) times daily. (Patient taking differently: Take 120 mg  by mouth 2 (two) times daily. 120 am , 80 in pm)  . hydrALAZINE (APRESOLINE) 10 MG tablet Take 1 tablet (10 mg total) by mouth every 8 (eight) hours.  . insulin glargine (LANTUS) 100 UNIT/ML injection Inject 50 Units into the skin 2 (two) times daily.   . insulin lispro (HUMALOG KWIKPEN) 100 UNIT/ML KiwkPen Inject 16 units with largest/evening meal.Dispense quikpens (Patient taking differently: Inject 16 units  with largest/evening meal.Dispense quikpens)  . isosorbide mononitrate (IMDUR) 30 MG 24 hr tablet Take 1 tablet (30 mg total) by mouth daily.  [name] levothyroxine (SYNTHROID, LEVOTHROID) 75 MCG tablet TAKE 1/2 TABLET (=37.5MCG) ALONG WITH LEVOTHYROXINE 150MCG TO EQUAL 187.5MCG DAILY. (Patient taking differently: TAKE 1/2 TABLET (=37.5MCG) BY MOUTH ALONG WITH LEVOTHYROXINE 150MCG TO EQUAL 187.5MCG DAILY.)  . linagliptin (TRADJENTA) 5 MG TABS tablet Take 1 tablet (5 mg total) by mouth daily.  [name] mexiletine (MEXITIL) 150 MG capsule TAKE 1 CAPSULE EVERY 8 HOURS (Patient taking differently: TAKE 1 CAPSULE BY MOUTH EVERY 8 HOURS)  . rosuvastatin (CRESTOR) 20 MG tablet Take 0.5 tablets (10 mg total) by mouth daily.  . tamsulosin (FLOMAX) 0.4 MG CAPS capsule Take 1 capsule (0.4 mg total) by mouth daily.  . [DISCONTINUED] levothyroxine (SYNTHROID, LEVOTHROID) 150 MCG tablet Take 150 mcg by mouth daily before breakfast.  . [DISCONTINUED] guaiFENesin-dextromethorphan (ROBITUSSIN DM) 100-10 MG/5ML syrup Take 5 mLs by mouth every 4 (four) hours as needed for cough.  . [DISCONTINUED] lactulose (CHRONULAC) 10 GM/15ML solution Take 30 mLs (20 g total) by mouth 3 (three) times daily. To maintain 2-3 loose bowel movements per day.    Review of Systems  Constitutional: Negative.   HENT: Negative.   Eyes: Negative.   Respiratory: Negative.   Cardiovascular: Negative.   Gastrointestinal: Positive for diarrhea and constipation.       Dark stools - on iron  Endocrine: Negative.   Genitourinary: Negative.   Musculoskeletal: Negative.   Skin: Negative.   Allergic/Immunologic: Negative.   Neurological: Negative.   Hematological: Negative.   Psychiatric/Behavioral: Negative.        Objective:   Physical Exam  Constitutional: He is oriented to person, place, and time. He appears well-developed and well-nourished.  HENT:  Head: Normocephalic and atraumatic.  Right Ear: External ear normal.  Left Ear: External ear  normal.  Nose: Nose normal.  Mouth/Throat: Oropharynx is clear and moist. No oropharyngeal exudate.  Eyes: Conjunctivae and EOM are normal. Pupils are equal, round, and reactive to light. Right eye exhibits no discharge. Left eye exhibits no discharge. No scleral icterus.  Neck: Normal range of motion. Neck supple. No tracheal deviation present. No thyromegaly present.  There are no bruits or anterior cervical adenopathy  Cardiovascular: Normal rate, regular rhythm and normal heart sounds.  Exam reveals no gallop and no friction rub.   No murmur heard. The heart has a regular rate and rhythm at 72/m  Pulmonary/Chest: Effort normal. No respiratory distress. He has wheezes. He has no rales. He exhibits no tenderness.  There are some inspiratory and expiratory wheezes in the right base posteriorly which seem to clear with coughing. The sputum is slightly yellow tinge but mostly clear.  Abdominal: Soft. Bowel sounds are normal. He exhibits no mass. There is no tenderness. There is no rebound and no guarding.  There are no masses or tenderness  Musculoskeletal: Normal range of motion. He exhibits no edema or tenderness.  Lymphadenopathy:    He has no cervical adenopathy.  Neurological: He is alert and oriented  to person, place, and time.  Skin: Skin is warm and dry. No rash noted. No erythema. There is pallor.  Psychiatric: He has a normal mood and affect. His behavior is normal. Judgment and thought content normal.  Nursing note and vitals reviewed.  BP 122/73 mmHg  Pulse 92  Temp(Src) 97.1 F (36.2 C) (Oral)  Ht $R'6\' 1"'PH$  (1.854 m)  Wt 293 lb (132.904 kg)  BMI 38.67 kg/m2  Results for orders placed or performed in visit on 10/28/14  POCT CBC  Result Value Ref Range   WBC 5.6 4.6 - 10.2 K/uL   Lymph, poc 1.1 0.6 - 3.4   POC LYMPH PERCENT 18.8 10 - 50 %L   POC Granulocyte 4.1 2 - 6.9   Granulocyte percent 74.0 37 - 80 %G   RBC 4.0 (A) 4.69 - 6.13 M/uL   Hemoglobin 10.1 (A) 14.1 - 18.1  g/dL   HCT, POC 33.2 (A) 43.5 - 53.7 %   MCV 82.6 80 - 97 fL   MCH, POC 25.1 (A) 27 - 31.2 pg   MCHC 30.4 (A) 31.8 - 35.4 g/dL   RDW, POC 18.0 %   Platelet Count, POC 68.0 (A) 142 - 424 K/uL   MPV 8.6 0 - 99.8 fL   The patient and his wife were informed of these results before they left the office.      Assessment & Plan:  1. Anemia, unspecified anemia type -Continue iron for daily but be sure and take the iron with food to lessen side effects of nausea - POCT CBC; Future - Hepatic function panel; Future - Iron; Future - POCT CBC  2. Elevated BUN -We will continue to monitor creatinine and renal function and patient should keep his appointment with the nephrologist as planned in February. - POCT CBC; Future - Hepatic function panel; Future - BMP8+EGFR; Future  3. Wheezing -Try the sample of Symbicort 80/4.52 puffs twice daily and rinse mouth after using to see if this helps the wheezing  4. Chronic systolic heart failure -Continue follow-up with cardiology and continue current Lasix dose -Monitor weights daily -Watch sodium intake  5. Type 2 diabetes mellitus without complication -Continue monitoring blood sugars regularly and bring these readings to next visit  6. Chronic kidney disease, stage 4 (severe) -Follow up with nephrologist as planned and come back for lab work next week  No orders of the defined types were placed in this encounter.   Patient Instructions  Use sample inhaler, Symbicort 2 puffs twice a day and rinse mouth after using Make sure that you take your iron medication with food. If he still having nausea reduce the amount of iron to 1 tablet 3 times daily with food and see if this helps the nausea. Continue to weigh yourself daily Watch your sodium intake Keep follow-up appointments with cardiologist and nephrologist Call us sooner if any increase in fever or sputum production Take Mucinex maximum strength, blue and white in color, 1 tablet twice a  day with water Use cool mist humidification keep the house as cool as possible   Arrie Senate MD

## 2014-11-07 ENCOUNTER — Encounter: Payer: Self-pay | Admitting: Internal Medicine

## 2014-11-13 ENCOUNTER — Encounter (HOSPITAL_COMMUNITY): Payer: Self-pay

## 2014-11-13 ENCOUNTER — Encounter (HOSPITAL_COMMUNITY)
Admission: RE | Admit: 2014-11-13 | Discharge: 2014-11-13 | Disposition: A | Discharge status: Home / Self Care | Payer: BLUE CROSS/BLUE SHIELD | Source: Ambulatory Visit | Attending: Nephrology | Admitting: Nephrology

## 2014-11-13 DIAGNOSIS — D509 Iron deficiency anemia, unspecified: Secondary | ICD-10-CM | POA: Insufficient documentation

## 2014-11-13 LAB — RENAL FUNCTION PANEL
ALBUMIN: 2.8 g/dL — AB (ref 3.5–5.2)
ANION GAP: 9 (ref 5–15)
BUN: 47 mg/dL — ABNORMAL HIGH (ref 6–23)
CHLORIDE: 100 mmol/L (ref 96–112)
CO2: 24 mmol/L (ref 19–32)
Calcium: 8.6 mg/dL (ref 8.4–10.5)
Creatinine, Ser: 2.19 mg/dL — ABNORMAL HIGH (ref 0.50–1.35)
GFR calc non Af Amer: 31 mL/min — ABNORMAL LOW (ref >90)
GFR, EST AFRICAN AMERICAN: 36 mL/min — AB (ref >90)
Glucose, Bld: 438 mg/dL — ABNORMAL HIGH (ref 70–99)
POTASSIUM: 3.5 mmol/L (ref 3.5–5.1)
Phosphorus: 3.5 mg/dL (ref 2.3–4.6)
Sodium: 133 mmol/L — ABNORMAL LOW (ref 135–145)

## 2014-11-13 LAB — HEMOGLOBIN AND HEMATOCRIT, BLOOD
HCT: 32 % — ABNORMAL LOW (ref 39.0–52.0)
HEMOGLOBIN: 10.2 g/dL — AB (ref 13.0–17.0)

## 2014-11-13 MED ORDER — SODIUM CHLORIDE 0.9 % IV SOLN
510.0000 mg | Freq: Once | INTRAVENOUS | Status: AC
  Administered 2014-11-13: 510 mg via INTRAVENOUS
  Filled 2014-11-13: qty 17

## 2014-11-13 MED ORDER — EPOETIN ALFA 10000 UNIT/ML IJ SOLN
INTRAMUSCULAR | Status: AC
  Filled 2014-11-13: qty 1

## 2014-11-13 MED ORDER — SODIUM CHLORIDE 0.9 % IV SOLN
Freq: Once | INTRAVENOUS | Status: AC
  Administered 2014-11-13: 250 mL via INTRAVENOUS

## 2014-11-13 MED ORDER — EPOETIN ALFA 10000 UNIT/ML IJ SOLN
10000.0000 [IU] | INTRAMUSCULAR | Status: DC
  Administered 2014-11-13: 10000 [IU] via SUBCUTANEOUS

## 2014-11-13 NOTE — Progress Notes (Signed)
Results for DORAL, WENHOLD (MRN [id number]) as of 11/13/2014 13:09 Renal panel, ferritin and iron panel pending will send when available.  Next appointment 11/20/2014 @1300 .   Procrit 10,000 units given along with 1st dose of fereheme infusion. Tolerated well.   Ref. Range 11/13/2014 13:00  Hemoglobin Latest Range: 13.0-17.0 g/dL 32.5 (L)  HCT Latest Range: 39.0-52.0 % 32.0 (L)

## 2014-11-13 NOTE — Discharge Instructions (Signed)
Epoetin Alfa injection °What is this medicine? °EPOETIN ALFA (e POE e tin AL fa) helps your body make more red blood cells. This medicine is used to treat anemia caused by chronic kidney failure, cancer chemotherapy, or HIV-therapy. It may also be used before surgery if you have anemia. °This medicine may be used for other purposes; ask your health care provider or pharmacist if you have questions. °COMMON BRAND NAME(S): Epogen, Procrit °What should I tell my health care provider before I take this medicine? °They need to know if you have any of these conditions: °-blood clotting disorders °-cancer patient not on chemotherapy °-cystic fibrosis °-heart disease, such as angina or heart failure °-hemoglobin level of 12 g/dL or greater °-high blood pressure °-low levels of folate, iron, or vitamin B12 °-seizures °-an unusual or allergic reaction to erythropoietin, albumin, benzyl alcohol, hamster proteins, other medicines, foods, dyes, or preservatives °-pregnant or trying to get pregnant °-breast-feeding °How should I use this medicine? °This medicine is for injection into a vein or under the skin. It is usually given by a health care professional in a hospital or clinic setting. °If you get this medicine at home, you will be taught how to prepare and give this medicine. Use exactly as directed. Take your medicine at regular intervals. Do not take your medicine more often than directed. °It is important that you put your used needles and syringes in a special sharps container. Do not put them in a trash can. If you do not have a sharps container, call your pharmacist or healthcare provider to get one. °Talk to your pediatrician regarding the use of this medicine in children. While this drug may be prescribed for selected conditions, precautions do apply. °Overdosage: If you think you have taken too much of this medicine contact a poison control center or emergency room at once. °NOTE: This medicine is only for you. Do  not share this medicine with others. °What if I miss a dose? °If you miss a dose, take it as soon as you can. If it is almost time for your next dose, take only that dose. Do not take double or extra doses. °What may interact with this medicine? °Do not take this medicine with any of the following medications: °-darbepoetin alfa °This list may not describe all possible interactions. Give your health care provider a list of all the medicines, herbs, non-prescription drugs, or dietary supplements you use. Also tell them if you smoke, drink alcohol, or use illegal drugs. Some items may interact with your medicine. °What should I watch for while using this medicine? °Visit your prescriber or health care professional for regular checks on your progress and for the needed blood tests and blood pressure measurements. It is especially important for the doctor to make sure your hemoglobin level is in the desired range, to limit the risk of potential side effects and to give you the best benefit. Keep all appointments for any recommended tests. Check your blood pressure as directed. Ask your doctor what your blood pressure should be and when you should contact him or her. °As your body makes more red blood cells, you may need to take iron, folic acid, or vitamin B supplements. Ask your doctor or health care provider which products are right for you. If you have kidney disease continue dietary restrictions, even though this medication can make you feel better. Talk with your doctor or health care professional about the foods you eat and the vitamins that you take. °What   side effects may I notice from receiving this medicine? °Side effects that you should report to your doctor or health care professional as soon as possible: °-allergic reactions like skin rash, itching or hives, swelling of the face, lips, or tongue °-breathing problems °-changes in vision °-chest pain °-confusion, trouble speaking or understanding °-feeling  faint or lightheaded, falls °-high blood pressure °-muscle aches or pains °-pain, swelling, warmth in the leg °-rapid weight gain °-severe headaches °-sudden numbness or weakness of the face, arm or leg °-trouble walking, dizziness, loss of balance or coordination °-seizures (convulsions) °-swelling of the ankles, feet, hands °-unusually weak or tired °Side effects that usually do not require medical attention (report to your doctor or health care professional if they continue or are bothersome): °-diarrhea °-fever, chills (flu-like symptoms) °-headaches °-nausea, vomiting °-redness, stinging, or swelling at site where injected °This list may not describe all possible side effects. Call your doctor for medical advice about side effects. You may report side effects to FDA at 1-800-FDA-1088. °Where should I keep my medicine? °Keep out of the reach of children. °Store in a refrigerator between 2 and 8 degrees C (36 and 46 degrees F). Do not freeze or shake. Throw away any unused portion if using a single-dose vial. Multi-dose vials can be kept in the refrigerator for up to 21 days after the initial dose. Throw away unused medicine. °NOTE: This sheet is a summary. It may not cover all possible information. If you have questions about this medicine, talk to your doctor, pharmacist, or health care provider. °© 2015, [employer]. (2008-09-03 10:25:44) °Ferumoxytol injection °What is this medicine? °FERUMOXYTOL is an iron complex. Iron is used to make healthy red blood cells, which carry oxygen and nutrients throughout the body. This medicine is used to treat iron deficiency anemia in people with chronic kidney disease. °This medicine may be used for other purposes; ask your health care provider or pharmacist if you have questions. °COMMON BRAND NAME(S): Feraheme °What should I tell my health care provider before I take this medicine? °They need to know if you have any of these conditions: °-anemia not caused by  low iron levels °-high levels of iron in the blood °-magnetic resonance imaging (MRI) test scheduled °-an unusual or allergic reaction to iron, other medicines, foods, dyes, or preservatives °-pregnant or trying to get pregnant °-breast-feeding °How should I use this medicine? °This medicine is for injection into a vein. It is given by a health care professional in a hospital or clinic setting. °Talk to your pediatrician regarding the use of this medicine in children. Special care may be needed. °Overdosage: If you think you've taken too much of this medicine contact a poison control center or emergency room at once. °Overdosage: If you think you have taken too much of this medicine contact a poison control center or emergency room at once. °NOTE: This medicine is only for you. Do not share this medicine with others. °What if I miss a dose? °It is important not to miss your dose. Call your doctor or health care professional if you are unable to keep an appointment. °What may interact with this medicine? °This medicine may interact with the following medications: °-other iron products °This list may not describe all possible interactions. Give your health care provider a list of all the medicines, herbs, non-prescription drugs, or dietary supplements you use. Also tell them if you smoke, drink alcohol, or use illegal drugs. Some items may interact with your medicine. °What should I   watch for while using this medicine? °Visit your doctor or healthcare professional regularly. Tell your doctor or healthcare professional if your symptoms do not start to get better or if they get worse. You may need blood work done while you are taking this medicine. °You may need to follow a special diet. Talk to your doctor. Foods that contain iron include: whole grains/cereals, dried fruits, beans, or peas, leafy green vegetables, and organ meats (liver, kidney). °What side effects may I notice from receiving this medicine? °Side  effects that you should report to your doctor or health care professional as soon as possible: °-allergic reactions like skin rash, itching or hives, swelling of the face, lips, or tongue °-breathing problems °-changes in blood pressure °-feeling faint or lightheaded, falls °-fever or chills °-flushing, sweating, or hot feelings °-swelling of the ankles or feet °Side effects that usually do not require medical attention (Report these to your doctor or health care professional if they continue or are bothersome.): °-diarrhea °-headache °-nausea, vomiting °-stomach pain °This list may not describe all possible side effects. Call your doctor for medical advice about side effects. You may report side effects to FDA at 1-800-FDA-1088. °Where should I keep my medicine? °This drug is given in a hospital or clinic and will not be stored at home. °NOTE: This sheet is a summary. It may not cover all possible information. If you have questions about this medicine, talk to your doctor, pharmacist, or health care provider. °© 2015, [employer]. (2012-05-05 15:23:36) ° ° °

## 2014-11-14 LAB — IRON AND TIBC
Iron: 69 ug/dL (ref 42–165)
SATURATION RATIOS: 25 % (ref 20–55)
TIBC: 277 ug/dL (ref 215–435)
UIBC: 208 ug/dL (ref 125–400)

## 2014-11-14 LAB — FERRITIN: Ferritin: 90 ng/mL (ref 22–322)

## 2014-11-14 NOTE — Progress Notes (Signed)
Iron panel, ferritin, renal panel results faxed to Dr Allena Katz.

## 2014-11-18 ENCOUNTER — Ambulatory Visit: Payer: BC Managed Care – PPO | Admitting: Family Medicine

## 2014-11-20 ENCOUNTER — Encounter (HOSPITAL_COMMUNITY)
Admission: RE | Admit: 2014-11-20 | Discharge: 2014-11-20 | Disposition: A | Discharge status: Home / Self Care | Payer: BLUE CROSS/BLUE SHIELD | Source: Ambulatory Visit | Attending: Nephrology | Admitting: Nephrology

## 2014-11-20 DIAGNOSIS — D509 Iron deficiency anemia, unspecified: Secondary | ICD-10-CM | POA: Diagnosis not present

## 2014-11-20 LAB — HEMOGLOBIN AND HEMATOCRIT, BLOOD
HEMATOCRIT: 34.3 % — AB (ref 39.0–52.0)
HEMOGLOBIN: 10.8 g/dL — AB (ref 13.0–17.0)

## 2014-11-20 MED ORDER — EPOETIN ALFA 10000 UNIT/ML IJ SOLN
10000.0000 [IU] | INTRAMUSCULAR | Status: DC
  Administered 2014-11-20: 10000 [IU] via SUBCUTANEOUS

## 2014-11-20 MED ORDER — EPOETIN ALFA 10000 UNIT/ML IJ SOLN
INTRAMUSCULAR | Status: AC
  Filled 2014-11-20: qty 1

## 2014-11-20 MED ORDER — SODIUM CHLORIDE 0.9 % IV SOLN
INTRAVENOUS | Status: DC
  Administered 2014-11-20: 250 mL via INTRAVENOUS

## 2014-11-20 MED ORDER — SODIUM CHLORIDE 0.9 % IV SOLN
510.0000 mg | Freq: Once | INTRAVENOUS | Status: AC
  Administered 2014-11-20: 510 mg via INTRAVENOUS
  Filled 2014-11-20: qty 17

## 2014-11-20 NOTE — Progress Notes (Signed)
Here for feraheme infusion, labs and procrit if indicated. Dx CKD and anemia

## 2014-11-20 NOTE — Progress Notes (Signed)
Results for VAUGHAN, MOWAT (MRN [id number]) as of 11/20/2014 13:17  Ref. Range 11/20/2014 12:58  Hemoglobin Latest Range: 13.0-17.0 g/dL 46.2 (L)  HCT Latest Range: 39.0-52.0 % 34.3 (L)

## 2014-11-23 ENCOUNTER — Other Ambulatory Visit: Payer: Self-pay | Admitting: Family Medicine

## 2014-11-25 NOTE — Telephone Encounter (Signed)
Last seen 10/28/14 [facility]  This med not on EPIC list

## 2014-11-27 ENCOUNTER — Encounter (HOSPITAL_COMMUNITY): Payer: Self-pay

## 2014-11-27 ENCOUNTER — Other Ambulatory Visit: Payer: Self-pay | Admitting: Family Medicine

## 2014-11-27 ENCOUNTER — Encounter: Payer: Self-pay | Admitting: Cardiology

## 2014-11-27 ENCOUNTER — Encounter (HOSPITAL_COMMUNITY)
Admission: RE | Admit: 2014-11-27 | Discharge: 2014-11-27 | Disposition: A | Discharge status: Home / Self Care | Payer: BLUE CROSS/BLUE SHIELD | Source: Ambulatory Visit | Attending: Nephrology | Admitting: Nephrology

## 2014-11-27 ENCOUNTER — Ambulatory Visit (INDEPENDENT_AMBULATORY_CARE_PROVIDER_SITE_OTHER): Payer: BLUE CROSS/BLUE SHIELD | Admitting: Cardiology

## 2014-11-27 VITALS — BP 92/50 | HR 60 | Ht 73.0 in | Wt 292.0 lb

## 2014-11-27 DIAGNOSIS — D509 Iron deficiency anemia, unspecified: Secondary | ICD-10-CM | POA: Diagnosis not present

## 2014-11-27 DIAGNOSIS — I472 Ventricular tachycardia, unspecified: Secondary | ICD-10-CM

## 2014-11-27 DIAGNOSIS — I429 Cardiomyopathy, unspecified: Secondary | ICD-10-CM

## 2014-11-27 LAB — HEMOGLOBIN AND HEMATOCRIT, BLOOD
HCT: 36 % — ABNORMAL LOW (ref 39.0–52.0)
Hemoglobin: 11.5 g/dL — ABNORMAL LOW (ref 13.0–17.0)

## 2014-11-27 MED ORDER — EPOETIN ALFA 10000 UNIT/ML IJ SOLN
10000.0000 [IU] | Freq: Once | INTRAMUSCULAR | Status: AC
  Administered 2014-11-27: 10000 [IU] via SUBCUTANEOUS
  Filled 2014-11-27: qty 1

## 2014-11-27 MED ORDER — LINAGLIPTIN 5 MG PO TABS: 5.0000 mg | ORAL_TABLET | Freq: Every day | ORAL | Status: DC

## 2014-11-27 MED ORDER — HYDRALAZINE HCL 10 MG PO TABS: 10.0000 mg | ORAL_TABLET | Freq: Three times a day (TID) | ORAL | Status: DC

## 2014-11-27 MED ORDER — ISOSORBIDE MONONITRATE ER 30 MG PO TB24: 30.0000 mg | ORAL_TABLET | Freq: Every day | ORAL | Status: DC

## 2014-11-27 MED ORDER — TAMSULOSIN HCL 0.4 MG PO CAPS: 0.4000 mg | ORAL_CAPSULE | Freq: Every day | ORAL | Status: DC

## 2014-11-27 NOTE — Progress Notes (Signed)
HPI  The patient presents for evaluation of cardiomyopathy and volume overload. He was admitted twice at the end of last year. He has volume overload. He did have paracentesis. He actually has seen GI for this evaluation as well. He does have renal insufficiency. Since going home he has had stable weights. He is weighing himself every day. He is not having any new shortness of breath, PND or orthopnea. He's not having any new palpitations, presyncope or syncope. He's had some chronic lower extremity swelling. He is watching his salt.  I did extensively review his hospital records from December.    Allergies  Allergen Reactions  . Ace Inhibitors     AVOID ALL  BECAUSE OF HEART DISEASE   . Nitroglycerin Other (See Comments)    Reaction unknown  . Procan Sr [Procainamide Hcl]     PT. HAS HEART DISEASE CALLED HYPERTHROPHIC CARDIOMYOPATHY  . Quinidine     PT. HAS HEART DISEASE CALLED HYPERTHROPHIC CARDIOMYOPATHY      Current Outpatient Prescriptions  Medication Sig Dispense Refill  . apixaban (ELIQUIS) 5 MG TABS tablet Take 1 tablet (5 mg total) by mouth 2 (two) times daily. 180 tablet 3  . carvedilol (COREG) 6.25 MG tablet Take 1 tablet (6.25 mg total) by mouth 2 (two) times daily with a meal. 60 tablet 1  . cholecalciferol (VITAMIN D) 1000 UNITS tablet Take 2,000 Units by mouth daily.     . fish oil-omega-3 fatty acids 1000 MG capsule Take 1 capsule by mouth 3 (three) times daily.     . furosemide (LASIX) 40 MG tablet Take 3 tablets (120 mg total) by mouth 2 (two) times daily. (Patient taking differently: Take 120 mg by mouth 2 (two) times daily. 120 am , 80 in pm) 60 tablet 1  . hydrALAZINE (APRESOLINE) 10 MG tablet Take 1 tablet (10 mg total) by mouth every 8 (eight) hours. 90 tablet 1  . insulin glargine (LANTUS) 100 UNIT/ML injection Inject 50 Units into the skin 2 (two) times daily.  90 mL 3  . insulin lispro (HUMALOG KWIKPEN) 100 UNIT/ML KiwkPen Inject 16 units with largest/evening  meal.Dispense quikpens (Patient taking differently: Inject 16 units with largest/evening meal.Dispense quikpens) 30 mL 11  . isosorbide mononitrate (IMDUR) 30 MG 24 hr tablet Take 1 tablet (30 mg total) by mouth daily. 30 tablet 1  . levothyroxine (SYNTHROID, LEVOTHROID) 75 MCG tablet TAKE 1/2 TABLET (=37.5MCG) ALONG WITH LEVOTHYROXINE TO EQUAL 187. DAILY. (Patient taking differently: TAKE 1/2 TABLET (=37.5MCG) BY MOUTH ALONG WITH LEVOTHYROXINE TO EQUAL 187. DAILY.) 30 tablet 1  . linagliptin (TRADJENTA) 5 MG TABS tablet Take 1 tablet (5 mg total) by mouth daily. 30 tablet 1  . mexiletine (MEXITIL) 150 MG capsule TAKE 1 CAPSULE EVERY 8 HOURS (Patient taking differently: TAKE 1 CAPSULE BY MOUTH EVERY 8 HOURS) 270 capsule 3  . rosuvastatin (CRESTOR) 20 MG tablet Take 0.5 tablets (10 mg total) by mouth daily. 30 tablet 0  . tamsulosin (FLOMAX) 0.4 MG CAPS capsule Take 1 capsule (0.4 mg total) by mouth daily. 30 capsule 1  . warfarin (COUMADIN) 5 MG tablet TAKE 1 TABLET DAILY OR AS DIRECTED BY ANTICOAGULATION CLINIC 90 tablet 0   No current facility-administered medications for this visit.    Past Medical History  Diagnosis Date  . HYPERLIPIDEMIA-MIXED   . Obesity, unspecified   . HYPERTENSION, UNSPECIFIED   . CAD (coronary artery disease)     a. Nonobstructive moderate CAD by cath - last 2012.  [name]  Hypertrophic obstructive cardiomyopathy   . Complete heart block     a. s/p pacemaker later upgraded to CRT-D (MDT).  . CKD (chronic kidney disease), stage IV     a. Baseline Cr 1.4-1.5 prior to 09/2014-->2.3-2.8 during admission 09/2014.  [name] Chronic systolic CHF (congestive heart failure)     a. NICM (EF 25% in 2012, 20-25% in 09/2013). b. s/p BiV-ICD (MDT);  c. 10/2014 EF 25%.  . Colon polyps   . Ventricular tachycardia     a. EPS/RFCA of VT in 2012. b. On amio, mexilitene. c. Last BiV-ICD gen change 05/2013 (MDT).  . Hypothyroidism   . Fatty liver disease, nonalcoholic   .  BPH (benign prostatic hyperplasia)   . Habitual alcohol use   . Liver cirrhosis   . 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 DM     insulin dependent   . Left ventricular apical thrombus     a. 10/2014 Echo: 1.5x1.5 cm apical clot-->on eliquis.  [name] NICM (nonischemic cardiomyopathy)     a. 10/2014 Echo: EF 25%, diff HK, 1.5 x 1.5 cm apical clot, mod dil LA.    Past Surgical History  Procedure Laterality Date  . Pacemaker insertion      medtronic  . Cardiac catheterization    . Facial reconstruction surgery      after trauma in 2006  . Bi-ventricular implantable cardioverter defibrillator N/A 05/25/2013    Procedure: BI-VENTRICULAR IMPLANTABLE CARDIOVERTER DEFIBRILLATOR  (CRT-D);  Surgeon: Marinus Maw, MD;  Location: [facility] C[facility] LAB;  Service: Cardiovascular;  Laterality: N/A;  . Back surgery      ROS:  As stated in the HPI and negative for all other systems.  PHYSICAL EXAM BP 92/50 mmHg  Pulse 60  Ht  (1.854 m)  Wt 292 lb (132.45 kg)  BMI 38.53 kg/m2 GENERAL:  Well appearing NECK:  No jugular venous distention, waveform within normal limits, carotid upstroke brisk and symmetric, no bruits, no thyromegaly LUNGS:  Diffuse expiratory wheezes with decreased breath sounds. CHEST:  Well healed device pocket HEART:  PMI not displaced or sustained,S1 and S2 within normal limits, no S3, no S4, no clicks, no rubs, no murmurs ABD:  Flat, positive bowel sounds normal in frequency in pitch, no bruits, no rebound, no guarding, no midline pulsatile mass, no hepatomegaly, no splenomegaly, obese EXT:  2 plus pulses throughout, trace edema, no cyanosis no clubbing SKIN:  No rashes no nodules  ASSESSMENT AND PLAN   NICM/Chronic systolic CHF:  He seems to be euvolemic at this point. We did talk about keeping his feet elevated.  Otherwise and not suggesting any change to his medical regimen.  LV apical thrombus: He is tolerating Eliquis.  His creatinine  clearance is 55 and so he is on target dose.  VT: S/p RFCA, BiV ICD and medical therapy.  He will follow up with Dr. [name] next month.  HTN: This is being managed in the context of treating his CHF  DM: On multiple meds. Per [name], MD  ETOH Abuse/hepatic cirrhosis:   This is reported in his past history and he has been educated.   CKD IV  He had stable follow up labs.

## 2014-11-27 NOTE — Patient Instructions (Addendum)
The current medical regimen is effective;  continue present plan and medications.  Please wear compression stockings daily. (20-30 mm/hg)  Follow up in 6 weeks with Dr Antoine Poche in [place].

## 2014-11-27 NOTE — Telephone Encounter (Signed)
done

## 2014-11-27 NOTE — Progress Notes (Signed)
Results for [name], [name] (MRN [id number]) as of 11/27/2014 13:19  Procrit 10,000 units given today, will repeat labs 12/04/2014  Tolerated well  Ref. Range 11/27/2014 12:34  Hemoglobin Latest Range: 13.0-17.0 g/dL 73.5 (L)  HCT Latest Range: 39.0-52.0 % 36.0 (L)

## 2014-12-04 ENCOUNTER — Encounter (HOSPITAL_COMMUNITY)
Admission: RE | Admit: 2014-12-04 | Discharge: 2014-12-04 | Disposition: A | Discharge status: Home / Self Care | Payer: BLUE CROSS/BLUE SHIELD | Source: Ambulatory Visit | Attending: Nephrology | Admitting: Nephrology

## 2014-12-04 DIAGNOSIS — N184 Chronic kidney disease, stage 4 (severe): Secondary | ICD-10-CM | POA: Diagnosis not present

## 2014-12-04 DIAGNOSIS — D638 Anemia in other chronic diseases classified elsewhere: Secondary | ICD-10-CM | POA: Diagnosis not present

## 2014-12-04 LAB — HEMOGLOBIN AND HEMATOCRIT, BLOOD
HCT: 36.4 % — ABNORMAL LOW (ref 39.0–52.0)
HEMOGLOBIN: 11.6 g/dL — AB (ref 13.0–17.0)

## 2014-12-04 MED ORDER — EPOETIN ALFA 10000 UNIT/ML IJ SOLN
10000.0000 [IU] | INTRAMUSCULAR | Status: DC
  Administered 2014-12-04: 10000 [IU] via SUBCUTANEOUS

## 2014-12-04 MED ORDER — EPOETIN ALFA 10000 UNIT/ML IJ SOLN
INTRAMUSCULAR | Status: AC
  Filled 2014-12-04: qty 1

## 2014-12-05 ENCOUNTER — Encounter: Payer: BLUE CROSS/BLUE SHIELD | Admitting: Internal Medicine

## 2014-12-05 ENCOUNTER — Other Ambulatory Visit: Payer: Self-pay | Admitting: Family Medicine

## 2014-12-05 NOTE — Progress Notes (Signed)
Results for INFINITE, NAYLOR (MRN [id number]) as of 12/05/2014 11:10  Ref. Range 12/04/2014 12:30  Hemoglobin Latest Range: 13.0-17.0 g/dL 95.7 (L)  HCT Latest Range: 39.0-52.0 % 36.4 (L)  Procrit 10,000 SQ given this Clinic visit

## 2014-12-11 ENCOUNTER — Encounter: Payer: BC Managed Care – PPO | Admitting: Internal Medicine

## 2014-12-11 ENCOUNTER — Encounter (HOSPITAL_COMMUNITY)
Admission: RE | Admit: 2014-12-11 | Discharge: 2014-12-11 | Disposition: A | Discharge status: Home / Self Care | Payer: BLUE CROSS/BLUE SHIELD | Source: Ambulatory Visit | Attending: Nephrology | Admitting: Nephrology

## 2014-12-11 DIAGNOSIS — D638 Anemia in other chronic diseases classified elsewhere: Secondary | ICD-10-CM | POA: Diagnosis not present

## 2014-12-11 LAB — RENAL FUNCTION PANEL
Albumin: 2.5 g/dL — ABNORMAL LOW (ref 3.5–5.2)
Anion gap: 7 (ref 5–15)
BUN: 32 mg/dL — ABNORMAL HIGH (ref 6–23)
CO2: 27 mmol/L (ref 19–32)
Calcium: 8.9 mg/dL (ref 8.4–10.5)
Chloride: 102 mmol/L (ref 96–112)
Creatinine, Ser: 2.04 mg/dL — ABNORMAL HIGH (ref 0.50–1.35)
GFR calc Af Amer: 40 mL/min — ABNORMAL LOW (ref >90)
GFR calc non Af Amer: 34 mL/min — ABNORMAL LOW (ref >90)
GLUCOSE: 263 mg/dL — AB (ref 70–99)
PHOSPHORUS: 4.5 mg/dL (ref 2.3–4.6)
Potassium: 4 mmol/L (ref 3.5–5.1)
SODIUM: 136 mmol/L (ref 135–145)

## 2014-12-11 LAB — HEMOGLOBIN AND HEMATOCRIT, BLOOD
HCT: 34.7 % — ABNORMAL LOW (ref 39.0–52.0)
HEMOGLOBIN: 11.1 g/dL — AB (ref 13.0–17.0)

## 2014-12-11 MED ORDER — EPOETIN ALFA 10000 UNIT/ML IJ SOLN
INTRAMUSCULAR | Status: AC
  Filled 2014-12-11: qty 1

## 2014-12-11 MED ORDER — EPOETIN ALFA 10000 UNIT/ML IJ SOLN
10000.0000 [IU] | INTRAMUSCULAR | Status: DC
  Administered 2014-12-11: 10000 [IU] via SUBCUTANEOUS

## 2014-12-11 NOTE — Progress Notes (Signed)
hgb 11.1 therefore procrit given per protocol.

## 2014-12-12 LAB — IRON AND TIBC
IRON: 78 ug/dL (ref 42–165)
SATURATION RATIOS: 36 % (ref 20–55)
TIBC: 216 ug/dL (ref 215–435)
UIBC: 138 ug/dL (ref 125–400)

## 2014-12-12 LAB — FERRITIN: Ferritin: 268 ng/mL (ref 22–322)

## 2014-12-13 ENCOUNTER — Other Ambulatory Visit: Payer: Self-pay | Admitting: *Deleted

## 2014-12-13 ENCOUNTER — Other Ambulatory Visit: Payer: Self-pay | Admitting: Family Medicine

## 2014-12-18 ENCOUNTER — Other Ambulatory Visit: Payer: Self-pay | Admitting: *Deleted

## 2014-12-18 ENCOUNTER — Encounter (HOSPITAL_COMMUNITY)
Admission: RE | Admit: 2014-12-18 | Discharge: 2014-12-18 | Disposition: A | Discharge status: Home / Self Care | Payer: BLUE CROSS/BLUE SHIELD | Source: Ambulatory Visit | Attending: Nephrology | Admitting: Nephrology

## 2014-12-18 DIAGNOSIS — D638 Anemia in other chronic diseases classified elsewhere: Secondary | ICD-10-CM | POA: Diagnosis not present

## 2014-12-18 LAB — HEMOGLOBIN AND HEMATOCRIT, BLOOD
HEMATOCRIT: 33.7 % — AB (ref 39.0–52.0)
Hemoglobin: 10.9 g/dL — ABNORMAL LOW (ref 13.0–17.0)

## 2014-12-18 MED ORDER — EPOETIN ALFA 10000 UNIT/ML IJ SOLN
INTRAMUSCULAR | Status: AC
  Filled 2014-12-18: qty 1

## 2014-12-18 MED ORDER — EPOETIN ALFA 10000 UNIT/ML IJ SOLN
10000.0000 [IU] | INTRAMUSCULAR | Status: DC
  Administered 2014-12-18: 10000 [IU] via SUBCUTANEOUS

## 2014-12-18 NOTE — Progress Notes (Signed)
Results for [name], [name] (MRN [id number]) as of 12/18/2014 12:29  Ref. Range 12/18/2014 12:08  Hemoglobin Latest Range: 13.0-17.0 g/dL 07.3 (L)  HCT Latest Range: 39.0-52.0 % 33.7 (L)

## 2014-12-19 ENCOUNTER — Ambulatory Visit (INDEPENDENT_AMBULATORY_CARE_PROVIDER_SITE_OTHER): Payer: BLUE CROSS/BLUE SHIELD | Admitting: Family Medicine

## 2014-12-19 ENCOUNTER — Telehealth: Payer: Self-pay | Admitting: Cardiology

## 2014-12-19 ENCOUNTER — Encounter: Payer: Self-pay | Admitting: Family Medicine

## 2014-12-19 VITALS — BP 100/66 | HR 85 | Temp 97.2°F | Ht 73.0 in | Wt 298.0 lb

## 2014-12-19 DIAGNOSIS — D649 Anemia, unspecified: Secondary | ICD-10-CM

## 2014-12-19 DIAGNOSIS — I10 Essential (primary) hypertension: Secondary | ICD-10-CM

## 2014-12-19 DIAGNOSIS — R0602 Shortness of breath: Secondary | ICD-10-CM | POA: Diagnosis not present

## 2014-12-19 DIAGNOSIS — E785 Hyperlipidemia, unspecified: Secondary | ICD-10-CM | POA: Diagnosis not present

## 2014-12-19 DIAGNOSIS — N400 Enlarged prostate without lower urinary tract symptoms: Secondary | ICD-10-CM

## 2014-12-19 DIAGNOSIS — E119 Type 2 diabetes mellitus without complications: Secondary | ICD-10-CM

## 2014-12-19 DIAGNOSIS — E559 Vitamin D deficiency, unspecified: Secondary | ICD-10-CM

## 2014-12-19 DIAGNOSIS — R638 Other symptoms and signs concerning food and fluid intake: Secondary | ICD-10-CM | POA: Diagnosis not present

## 2014-12-19 DIAGNOSIS — I5022 Chronic systolic (congestive) heart failure: Secondary | ICD-10-CM | POA: Diagnosis not present

## 2014-12-19 DIAGNOSIS — N184 Chronic kidney disease, stage 4 (severe): Secondary | ICD-10-CM

## 2014-12-19 DIAGNOSIS — E039 Hypothyroidism, unspecified: Secondary | ICD-10-CM

## 2014-12-19 LAB — POCT UA - MICROSCOPIC ONLY
Bacteria, U Microscopic: NEGATIVE
CRYSTALS, UR, HPF, POC: NEGATIVE
Casts, Ur, LPF, POC: NEGATIVE
Yeast, UA: NEGATIVE

## 2014-12-19 LAB — POCT CBC
GRANULOCYTE PERCENT: 67.3 % (ref 37–80)
HCT, POC: 35.1 % — AB (ref 43.5–53.7)
Hemoglobin: 10.9 g/dL — AB (ref 14.1–18.1)
Lymph, poc: 1 (ref 0.6–3.4)
MCH, POC: 27.1 pg (ref 27–31.2)
MCHC: 31 g/dL — AB (ref 31.8–35.4)
MCV: 87.5 fL (ref 80–97)
MPV: 9.2 fL (ref 0–99.8)
POC Granulocyte: 2.9 (ref 2–6.9)
POC LYMPH PERCENT: 22.1 %L (ref 10–50)
Platelet Count, POC: 60 10*3/uL — AB (ref 142–424)
RBC: 4.01 M/uL — AB (ref 4.69–6.13)
RDW, POC: 17.3 %
WBC: 4.3 10*3/uL — AB (ref 4.6–10.2)

## 2014-12-19 LAB — POCT GLYCOSYLATED HEMOGLOBIN (HGB A1C): Hemoglobin A1C: 8

## 2014-12-19 LAB — POCT UA - MICROALBUMIN: Microalbumin Ur, POC: NEGATIVE mg/L

## 2014-12-19 MED ORDER — TAMSULOSIN HCL 0.4 MG PO CAPS: 0.4000 mg | ORAL_CAPSULE | Freq: Every day | ORAL | Status: DC

## 2014-12-19 MED ORDER — ISOSORBIDE MONONITRATE ER 30 MG PO TB24: 30.0000 mg | ORAL_TABLET | Freq: Every day | ORAL | Status: DC

## 2014-12-19 MED ORDER — HYDRALAZINE HCL 10 MG PO TABS: 10.0000 mg | ORAL_TABLET | Freq: Three times a day (TID) | ORAL | Status: DC

## 2014-12-19 MED ORDER — LINAGLIPTIN 5 MG PO TABS: 5.0000 mg | ORAL_TABLET | Freq: Every day | ORAL | Status: DC

## 2014-12-19 NOTE — Progress Notes (Signed)
Subjective:    Patient ID: [name], male    DOB: [birth year], 59 y.o.   MRN: [id number]  HPI Pt here for follow up and management of chronic medical problems which includes hypertension, hyperlipidemia, and diabetes. He is taking medications regularly. The patient has been having some increased edema but no increased shortness of breath. He is followed by the cardiologist regularly. He checks his blood sugars at home on a regular basis. There is some question about his alcohol intake and he has a history of alcohol use but says he has not had any alcohol since before Christmas. He denies chest pain increased shortness of breath issues with his GI tract or trouble passing his water. He is taking his medicines regularly and his wife is very involved with his care.         Patient Active Problem List   Diagnosis Date Noted  . NICM (nonischemic cardiomyopathy)   . Left ventricular apical thrombus   . Chronic systolic CHF (congestive heart failure)   . Essential hypertension   . CKD (chronic kidney disease), stage IV   . Absolute anemia 10/14/2014  . Cirrhosis   . Chronic anticoagulation 10/07/2014  . Ascites   . Acute on chronic combined systolic and diastolic congestive heart failure   . AKI (acute kidney injury) 10/02/2014  . SOB (shortness of breath) 10/02/2014  . Leg swelling 10/02/2014  . 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 Acute renal failure syndrome   . Bronchospasm, acute   . Congestive heart disease   . Wheezing 08/31/2014  . Hypothermia 08/31/2014  . Chronic systolic heart failure 36/14/4315  . Hypoglycemia 08/31/2014  . Anemia 08/31/2014  . Thrombocytopenia 08/31/2014  . High risk medication use 10/29/2013  . Mural thrombus of left ventricle 10/08/2013  . Acute respiratory failure 10/01/2013  . Sepsis 09/29/2013  . Acute on chronic renal insufficiency 09/29/2013  . Cardiomyopathy 09/29/2013  . Type 2 diabetes mellitus with renal manifestations, controlled  09/29/2013  . Metabolic acidosis 40/05/6760  . Hyperkalemia 09/29/2013  . Hypothyroidism (acquired) 04/09/2013  . Ventricular tachycardia 08/12/2011  . Obesity 01/07/2010  . Disorder resulting from impaired renal function 01/07/2010  . Automatic implantable cardioverter-defibrillator in situ 06/10/2009  . Type 2 diabetes mellitus treated with insulin 01/16/2009  . Hyperlipidemia 01/16/2009  . HTN (hypertension) 01/16/2009  . MYOCARDIAL INFARCTION 01/16/2009  . Hypertrophic obstructive cardiomyopathy 01/16/2009  . Secondary cardiomyopathy 01/16/2009  . AV BLOCK, COMPLETE 01/16/2009  . BRADYCARDIA 01/16/2009   Outpatient Encounter Prescriptions as of 12/19/2014  Medication Sig  . apixaban (ELIQUIS) 5 MG TABS tablet Take 1 tablet (5 mg total) by mouth 2 (two) times daily.  . carvedilol (COREG) 6.25 MG tablet Take 1 tablet (6.25 mg total) by mouth 2 (two) times daily with a meal.  . cholecalciferol (VITAMIN D) 1000 UNITS tablet Take 2,000 Units by mouth daily.   . fish oil-omega-3 fatty acids 1000 MG capsule Take 1 capsule by mouth 3 (three) times daily.   . furosemide (LASIX) 40 MG tablet Take 3 tablets (120 mg total) by mouth 2 (two) times daily. (Patient taking differently: Take 120 mg by mouth 2 (two) times daily. 120 am , 80 in pm)  . hydrALAZINE (APRESOLINE) 10 MG tablet Take 1 tablet (10 mg total) by mouth every 8 (eight) hours.  . insulin glargine (LANTUS) 100 UNIT/ML injection Inject 50 Units into the skin 2 (two) times daily.   . insulin lispro (HUMALOG KWIKPEN) 100 UNIT/ML KiwkPen Inject 16 units with  largest/evening meal.Dispense quikpens (Patient taking differently: Inject 16 units with largest/evening meal.Dispense quikpens)  . isosorbide mononitrate (IMDUR) 30 MG 24 hr tablet Take 1 tablet (30 mg total) by mouth daily.  [name] levothyroxine (SYNTHROID, LEVOTHROID) 75 MCG tablet TAKE 1/2 TABLET (=37.5MCG) ALONG WITH LEVOTHYROXINE 150MCG TO EQUAL 187.5MCG DAILY. (Patient taking  differently: TAKE 1/2 TABLET (=37.5MCG) BY MOUTH ALONG WITH LEVOTHYROXINE 150MCG TO EQUAL 187.5MCG DAILY.)  . linagliptin (TRADJENTA) 5 MG TABS tablet Take 1 tablet (5 mg total) by mouth daily.  [name] mexiletine (MEXITIL) 150 MG capsule TAKE 1 CAPSULE EVERY 8 HOURS (Patient taking differently: TAKE 1 CAPSULE BY MOUTH EVERY 8 HOURS)  . rosuvastatin (CRESTOR) 20 MG tablet Take 0.5 tablets (10 mg total) by mouth daily.  . tamsulosin (FLOMAX) 0.4 MG CAPS capsule Take 1 capsule (0.4 mg total) by mouth daily.    Review of Systems  Constitutional: Negative.   HENT: Negative.   Eyes: Negative.   Respiratory: Negative.   Cardiovascular: Negative.   Gastrointestinal: Negative.   Endocrine: Negative.   Genitourinary: Negative.   Musculoskeletal: Negative.   Skin: Negative.        Skin on back is painful  Allergic/Immunologic: Negative.   Neurological: Negative.   Hematological: Negative.   Psychiatric/Behavioral: Negative.        Objective:   Physical Exam  Constitutional: He is oriented to person, place, and time.  The patient is morbidly obese and this is been the case for many years.  HENT:  Head: Normocephalic and atraumatic.  Right Ear: External ear normal.  Left Ear: External ear normal.  Nose: Nose normal.  Mouth/Throat: Oropharynx is clear and moist. No oropharyngeal exudate.  Eyes: Conjunctivae and EOM are normal. Pupils are equal, round, and reactive to light. Right eye exhibits no discharge. Left eye exhibits no discharge. No scleral icterus.  Neck: Normal range of motion. Neck supple. No thyromegaly present.  No thyromegaly anterior cervical nodes or carotid bruits  Cardiovascular: Normal rate, regular rhythm, normal heart sounds and intact distal pulses.  Exam reveals no gallop and no friction rub.   No murmur heard. The heart was regular at 84/m  Pulmonary/Chest: Effort normal and breath sounds normal. No respiratory distress. He has no wheezes. He has no rales. He exhibits  no tenderness.  Lungs were clear anteriorly and posteriorly  Abdominal: Soft. Bowel sounds are normal. He exhibits no mass. There is no tenderness. There is no rebound and no guarding.  Morbidly obese without masses or tenderness  Genitourinary: Rectum normal, prostate normal and penis normal.  The prostate was enlarged but soft and smooth. There are no lumps or masses. There are no rectal masses. The external genitalia were normal without inguinal hernia or masses.  Musculoskeletal: Normal range of motion. He exhibits edema. He exhibits no tenderness.  The patient has 2+ pedal edema and 1+ pretibial edema  Lymphadenopathy:    He has no cervical adenopathy.  Neurological: He is alert and oriented to person, place, and time. He has normal reflexes. No cranial nerve deficit.  Skin: Skin is warm and dry. No rash noted.  Psychiatric: He has a normal mood and affect. His behavior is normal. Judgment and thought content normal.  Nursing note and vitals reviewed.  BP 100/66 mmHg  Pulse 85  Temp(Src) 97.2 F (36.2 C) (Oral)  Ht $R'6\' 1"'eK$  (1.854 m)  Wt 298 lb (135.172 kg)  BMI 39.32 kg/m2  SpO2 96%        Assessment & Plan:  1. Type  2 diabetes mellitus with chronic kidney disease stage IV -Patient is trying to do better with his diet habits and he is taking his medicines regularly and she he should continue with this pending results of lab work being done today. The patient should continue with his current diabetes medication. - POCT CBC - POCT glycosylated hemoglobin (Hb A1C) - BMP8+EGFR  2. Hypertension with stage IV chronic kidney disease -The blood pressure is under good control today and his most recent renal function tests will be drawn today and these will be followed closely. He should continue with his current heart failure and blood pressure medication. - POCT CBC - BMP8+EGFR - Hepatic function panel - Brain natriuretic peptide  3. Chronic kidney disease, stage 4  (severe) -This will be rechecked today and the patient is asked to continue to monitor his sodium intake and keep his blood pressure under the best control as possible - POCT CBC - BMP8+EGFR  4. Chronic systolic heart failure with hypertension -The patient is followed by the cardiologist regularly - POCT CBC - BMP8+EGFR - Hepatic function panel  5. Anemia, unspecified anemia type -This anemia is secondary to his chronic kidney disease - POCT CBC  6. Hypothyroidism (acquired) -This will be checked today to make sure there is no need for treatment. - POCT CBC  7. Vitamin D deficiency -The patient should continue with his current vitamin D intake until lab work is returned today and it will be adjusted at that time. - POCT CBC - Vit D  25 hydroxy (rtn osteoporosis monitoring)  8. Hyperlipidemia -He should continue with his current Crestor dose and with as aggressive therapeutic lifestyle changes as possible - POCT CBC - BMP8+EGFR - Hepatic function panel - NMR, lipoprofile  9. BPH (benign prostatic hyperplasia) -The prostate today was enlarged but smooth. He should continue with his Flomax. And he is having no symptoms passing his water. - POCT CBC - POCT UA - Microalbumin - POCT UA - Microscopic Only - PSA, total and free  10. SOB (shortness of breath) -This is stable although he has had increased fluid gain. - Brain natriuretic peptide  11. Increased BMI -The patient is offered diet counseling through the clinical pharmacist  Meds ordered this encounter  Medications  . tamsulosin (FLOMAX) 0.4 MG CAPS capsule    Sig: Take 1 capsule (0.4 mg total) by mouth daily.    Dispense:  90 capsule    Refill:  3  . linagliptin (TRADJENTA) 5 MG TABS tablet    Sig: Take 1 tablet (5 mg total) by mouth daily.    Dispense:  90 tablet    Refill:  3  . isosorbide mononitrate (IMDUR) 30 MG 24 hr tablet    Sig: Take 1 tablet (30 mg total) by mouth daily.    Dispense:  90 tablet     Refill:  3  . hydrALAZINE (APRESOLINE) 10 MG tablet    Sig: Take 1 tablet (10 mg total) by mouth every 8 (eight) hours.    Dispense:  90 tablet    Refill:  3   Patient Instructions  Continue current medications. Continue good therapeutic lifestyle changes which include good diet and exercise. Fall precautions discussed with patient. If an FOBT was given today- please return it to our front desk. If you are over 89 years old - you may need Prevnar 58 or the adult Pneumonia vaccine.  Flu Shots are still available at our office. If you still haven't had one please call  to set up a nurse visit to get one.   After your visit with [place] today you will receive a survey in the mail or online from [employer] regarding your care with [place]. Please take a moment to fill this out. Your feedback is very important to [place] as you can help [place] better understand your patient needs as well as improve your experience and satisfaction. WE CARE ABOUT YOU!!!   The patient should continue to monitor his weight the first thing in the morning He should continue with fluid pills as he is doing He must start using his support hose and put these on the first thing when he gets up in the morning He should continue to watch his sodium intake The patient should return to the office and meet with the clinical pharmacist regarding his increased BMI and his multiple comorbid problems and polypharmacy   Arrie Senate MD

## 2014-12-19 NOTE — Patient Instructions (Addendum)
Continue current medications. Continue good therapeutic lifestyle changes which include good diet and exercise. Fall precautions discussed with patient. If an FOBT was given today- please return it to our front desk. If you are over 59 years old - you may need Prevnar 13 or the adult Pneumonia vaccine.  Flu Shots are still available at our office. If you still haven't had one please call to set up a nurse visit to get one.   After your visit with [place] today you will receive a survey in the mail or online from [employer] regarding your care with [place]. Please take a moment to fill this out. Your feedback is very important to [place] as you can help [place] better understand your patient needs as well as improve your experience and satisfaction. WE CARE ABOUT YOU!!!   The patient should continue to monitor his weight the first thing in the morning He should continue with fluid pills as he is doing He must start using his support hose and put these on the first thing when he gets up in the morning He should continue to watch his sodium intake The patient should return to the office and meet with the clinical pharmacist regarding his increased BMI and his multiple comorbid problems and polypharmacy

## 2014-12-20 NOTE — Telephone Encounter (Signed)
Done at 12/18/14 appt

## 2014-12-23 ENCOUNTER — Telehealth: Payer: Self-pay | Admitting: Family Medicine

## 2014-12-24 MED ORDER — ISOSORBIDE MONONITRATE ER 30 MG PO TB24: 30.0000 mg | ORAL_TABLET | Freq: Every day | ORAL | Status: AC

## 2014-12-24 MED ORDER — LINAGLIPTIN 5 MG PO TABS: 5.0000 mg | ORAL_TABLET | Freq: Every day | ORAL | Status: AC

## 2014-12-24 MED ORDER — HYDRALAZINE HCL 10 MG PO TABS: 10.0000 mg | ORAL_TABLET | Freq: Three times a day (TID) | ORAL | Status: AC

## 2014-12-24 MED ORDER — TAMSULOSIN HCL 0.4 MG PO CAPS: 0.4000 mg | ORAL_CAPSULE | Freq: Every day | ORAL | Status: AC

## 2014-12-25 ENCOUNTER — Other Ambulatory Visit: Payer: Self-pay | Admitting: Family Medicine

## 2014-12-25 ENCOUNTER — Encounter (HOSPITAL_COMMUNITY): Payer: Self-pay

## 2014-12-25 ENCOUNTER — Encounter (HOSPITAL_COMMUNITY)
Admission: RE | Admit: 2014-12-25 | Discharge: 2014-12-25 | Disposition: A | Discharge status: Home / Self Care | Payer: BLUE CROSS/BLUE SHIELD | Source: Ambulatory Visit | Attending: Nephrology | Admitting: Nephrology

## 2014-12-25 DIAGNOSIS — D638 Anemia in other chronic diseases classified elsewhere: Secondary | ICD-10-CM | POA: Diagnosis not present

## 2014-12-25 LAB — HEMOGLOBIN AND HEMATOCRIT, BLOOD
HEMATOCRIT: 34.4 % — AB (ref 39.0–52.0)
HEMOGLOBIN: 11 g/dL — AB (ref 13.0–17.0)

## 2014-12-25 MED ORDER — EPOETIN ALFA 10000 UNIT/ML IJ SOLN
INTRAMUSCULAR | Status: AC
  Filled 2014-12-25: qty 1

## 2014-12-25 MED ORDER — EPOETIN ALFA 10000 UNIT/ML IJ SOLN
10000.0000 [IU] | Freq: Once | INTRAMUSCULAR | Status: AC
  Administered 2014-12-25: 10000 [IU] via SUBCUTANEOUS

## 2014-12-25 NOTE — Progress Notes (Signed)
Results for ZAEEM, WINSTANLEY (MRN [id number]) as of 12/25/2014 12:27  Labs prior to Procrit  40981 units given as indicated. Appointment made for 01/01/2015  Ref. Range 12/25/2014 12:05  Hemoglobin Latest Range: 13.0-17.0 g/dL 19.1 (L)  HCT Latest Range: 39.0-52.0 % 34.4 (L)

## 2014-12-30 ENCOUNTER — Emergency Department (HOSPITAL_COMMUNITY): Payer: BLUE CROSS/BLUE SHIELD

## 2014-12-30 ENCOUNTER — Other Ambulatory Visit: Payer: Self-pay | Admitting: Physician Assistant

## 2014-12-30 ENCOUNTER — Inpatient Hospital Stay (HOSPITAL_COMMUNITY)
Admission: EM | Admit: 2014-12-30 | Discharge: 2015-01-05 | DRG: 292 | Disposition: A | Discharge status: Home / Self Care | Payer: BLUE CROSS/BLUE SHIELD | Attending: Cardiology | Admitting: Cardiology

## 2014-12-30 ENCOUNTER — Encounter (HOSPITAL_COMMUNITY): Payer: Self-pay | Admitting: Emergency Medicine

## 2014-12-30 DIAGNOSIS — I2510 Atherosclerotic heart disease of native coronary artery without angina pectoris: Secondary | ICD-10-CM | POA: Diagnosis present

## 2014-12-30 DIAGNOSIS — Z888 Allergy status to other drugs, medicaments and biological substances status: Secondary | ICD-10-CM

## 2014-12-30 DIAGNOSIS — N400 Enlarged prostate without lower urinary tract symptoms: Secondary | ICD-10-CM | POA: Diagnosis present

## 2014-12-30 DIAGNOSIS — E11649 Type 2 diabetes mellitus with hypoglycemia without coma: Secondary | ICD-10-CM | POA: Diagnosis present

## 2014-12-30 DIAGNOSIS — I129 Hypertensive chronic kidney disease with stage 1 through stage 4 chronic kidney disease, or unspecified chronic kidney disease: Secondary | ICD-10-CM | POA: Diagnosis present

## 2014-12-30 DIAGNOSIS — N179 Acute kidney failure, unspecified: Secondary | ICD-10-CM | POA: Diagnosis present

## 2014-12-30 DIAGNOSIS — Z95810 Presence of automatic (implantable) cardiac defibrillator: Secondary | ICD-10-CM | POA: Diagnosis present

## 2014-12-30 DIAGNOSIS — Z794 Long term (current) use of insulin: Secondary | ICD-10-CM | POA: Diagnosis not present

## 2014-12-30 DIAGNOSIS — R0602 Shortness of breath: Secondary | ICD-10-CM

## 2014-12-30 DIAGNOSIS — K7460 Unspecified cirrhosis of liver: Secondary | ICD-10-CM | POA: Diagnosis present

## 2014-12-30 DIAGNOSIS — Z7901 Long term (current) use of anticoagulants: Secondary | ICD-10-CM | POA: Diagnosis not present

## 2014-12-30 DIAGNOSIS — E039 Hypothyroidism, unspecified: Secondary | ICD-10-CM | POA: Diagnosis present

## 2014-12-30 DIAGNOSIS — I5021 Acute systolic (congestive) heart failure: Secondary | ICD-10-CM | POA: Insufficient documentation

## 2014-12-30 DIAGNOSIS — I472 Ventricular tachycardia: Secondary | ICD-10-CM | POA: Diagnosis present

## 2014-12-30 DIAGNOSIS — D638 Anemia in other chronic diseases classified elsewhere: Secondary | ICD-10-CM | POA: Diagnosis present

## 2014-12-30 DIAGNOSIS — Z6838 Body mass index (BMI) 38.0-38.9, adult: Secondary | ICD-10-CM | POA: Diagnosis not present

## 2014-12-30 DIAGNOSIS — I248 Other forms of acute ischemic heart disease: Secondary | ICD-10-CM | POA: Diagnosis present

## 2014-12-30 DIAGNOSIS — Z86010 Personal history of colonic polyps: Secondary | ICD-10-CM

## 2014-12-30 DIAGNOSIS — E669 Obesity, unspecified: Secondary | ICD-10-CM | POA: Diagnosis present

## 2014-12-30 DIAGNOSIS — E785 Hyperlipidemia, unspecified: Secondary | ICD-10-CM | POA: Diagnosis present

## 2014-12-30 DIAGNOSIS — I5023 Acute on chronic systolic (congestive) heart failure: Principal | ICD-10-CM | POA: Diagnosis present

## 2014-12-30 DIAGNOSIS — I509 Heart failure, unspecified: Secondary | ICD-10-CM

## 2014-12-30 DIAGNOSIS — I10 Essential (primary) hypertension: Secondary | ICD-10-CM | POA: Diagnosis present

## 2014-12-30 DIAGNOSIS — I421 Obstructive hypertrophic cardiomyopathy: Secondary | ICD-10-CM | POA: Diagnosis present

## 2014-12-30 DIAGNOSIS — I2109 ST elevation (STEMI) myocardial infarction involving other coronary artery of anterior wall: Secondary | ICD-10-CM | POA: Diagnosis not present

## 2014-12-30 DIAGNOSIS — Z86718 Personal history of other venous thrombosis and embolism: Secondary | ICD-10-CM | POA: Diagnosis not present

## 2014-12-30 DIAGNOSIS — I442 Atrioventricular block, complete: Secondary | ICD-10-CM | POA: Diagnosis present

## 2014-12-30 DIAGNOSIS — E119 Type 2 diabetes mellitus without complications: Secondary | ICD-10-CM

## 2014-12-30 DIAGNOSIS — Z87891 Personal history of nicotine dependence: Secondary | ICD-10-CM | POA: Diagnosis not present

## 2014-12-30 DIAGNOSIS — N184 Chronic kidney disease, stage 4 (severe): Secondary | ICD-10-CM | POA: Diagnosis present

## 2014-12-30 DIAGNOSIS — I519 Heart disease, unspecified: Secondary | ICD-10-CM | POA: Diagnosis not present

## 2014-12-30 LAB — BASIC METABOLIC PANEL
ANION GAP: 12 (ref 5–15)
BUN: 43 mg/dL — ABNORMAL HIGH (ref 6–23)
CALCIUM: 9.3 mg/dL (ref 8.4–10.5)
CO2: 25 mmol/L (ref 19–32)
CREATININE: 2.41 mg/dL — AB (ref 0.50–1.35)
Chloride: 103 mmol/L (ref 96–112)
GFR calc non Af Amer: 28 mL/min — ABNORMAL LOW (ref >90)
GFR, EST AFRICAN AMERICAN: 32 mL/min — AB (ref >90)
Glucose, Bld: 66 mg/dL — ABNORMAL LOW (ref 70–99)
Potassium: 3.6 mmol/L (ref 3.5–5.1)
SODIUM: 140 mmol/L (ref 135–145)

## 2014-12-30 LAB — NMR, LIPOPROFILE

## 2014-12-30 LAB — BRAIN NATRIURETIC PEPTIDE
B NATRIURETIC PEPTIDE 5: 1273.1 pg/mL — AB (ref 0.0–100.0)
BNP: 1060 pg/mL — ABNORMAL HIGH (ref 0.0–100.0)

## 2014-12-30 LAB — BMP8+EGFR
BUN/Creatinine Ratio: 17 (ref 9–20)
BUN: 37 mg/dL — ABNORMAL HIGH (ref 6–24)
CO2: 24 mmol/L (ref 18–29)
Calcium: 9.1 mg/dL (ref 8.7–10.2)
Chloride: 100 mmol/L (ref 97–108)
Creatinine, Ser: 2.21 mg/dL — ABNORMAL HIGH (ref 0.76–1.27)
GFR, EST AFRICAN AMERICAN: 37 mL/min/{1.73_m2} — AB (ref >59)
GFR, EST NON AFRICAN AMERICAN: 32 mL/min/{1.73_m2} — AB (ref >59)
GLUCOSE: 165 mg/dL — AB (ref 65–99)
Potassium: 4.2 mmol/L (ref 3.5–5.2)
SODIUM: 140 mmol/L (ref 134–144)

## 2014-12-30 LAB — I-STAT TROPONIN, ED: Troponin i, poc: 0.16 ng/mL (ref 0.00–0.08)

## 2014-12-30 LAB — CBC WITH DIFFERENTIAL/PLATELET
BASOS ABS: 0 10*3/uL (ref 0.0–0.1)
BASOS PCT: 0 % (ref 0–1)
EOS PCT: 3 % (ref 0–5)
Eosinophils Absolute: 0.2 10*3/uL (ref 0.0–0.7)
HCT: 32.9 % — ABNORMAL LOW (ref 39.0–52.0)
Hemoglobin: 10.7 g/dL — ABNORMAL LOW (ref 13.0–17.0)
LYMPHS ABS: 0.9 10*3/uL (ref 0.7–4.0)
LYMPHS PCT: 16 % (ref 12–46)
MCH: 28.5 pg (ref 26.0–34.0)
MCHC: 32.5 g/dL (ref 30.0–36.0)
MCV: 87.7 fL (ref 78.0–100.0)
Monocytes Absolute: 0.7 10*3/uL (ref 0.1–1.0)
Monocytes Relative: 12 % (ref 3–12)
Neutro Abs: 4 10*3/uL (ref 1.7–7.7)
Neutrophils Relative %: 69 % (ref 43–77)
Platelets: 76 10*3/uL — ABNORMAL LOW (ref 150–400)
RBC: 3.75 MIL/uL — AB (ref 4.22–5.81)
RDW: 16.3 % — AB (ref 11.5–15.5)
WBC: 5.7 10*3/uL (ref 4.0–10.5)

## 2014-12-30 LAB — HEPATIC FUNCTION PANEL
ALT: 30 IU/L (ref 0–44)
AST: 58 IU/L — ABNORMAL HIGH (ref 0–40)
Albumin: 3 g/dL — ABNORMAL LOW (ref 3.5–5.5)
Alkaline Phosphatase: 102 IU/L (ref 39–117)
BILIRUBIN TOTAL: 1.2 mg/dL (ref 0.0–1.2)
Bilirubin, Direct: 0.48 mg/dL — ABNORMAL HIGH (ref 0.00–0.40)
Total Protein: 6.2 g/dL (ref 6.0–8.5)

## 2014-12-30 LAB — VITAMIN D 25 HYDROXY (VIT D DEFICIENCY, FRACTURES): Vit D, 25-Hydroxy: 53.3 ng/mL (ref 30.0–100.0)

## 2014-12-30 LAB — PSA, TOTAL AND FREE
PSA, Free Pct: 46.7 %
PSA, Free: 0.14 ng/mL
PSA: 0.3 ng/mL (ref 0.0–4.0)

## 2014-12-30 LAB — GLUCOSE, CAPILLARY: GLUCOSE-CAPILLARY: 151 mg/dL — AB (ref 70–99)

## 2014-12-30 LAB — TROPONIN I
TROPONIN I: 0.51 ng/mL — AB (ref <0.031)
Troponin I: 0.48 ng/mL — ABNORMAL HIGH (ref <0.031)
Troponin I: 0.45 ng/mL — ABNORMAL HIGH (ref <0.031)

## 2014-12-30 LAB — CBG MONITORING, ED: Glucose-Capillary: 70 mg/dL (ref 70–99)

## 2014-12-30 MED ORDER — OMEGA-3-ACID ETHYL ESTERS 1 G PO CAPS
1.0000 g | ORAL_CAPSULE | Freq: Three times a day (TID) | ORAL | Status: DC
  Administered 2014-12-30 – 2015-01-05 (×17): 1 g via ORAL
  Filled 2014-12-30 (×19): qty 1

## 2014-12-30 MED ORDER — APIXABAN 5 MG PO TABS
5.0000 mg | ORAL_TABLET | Freq: Two times a day (BID) | ORAL | Status: DC
  Administered 2014-12-30 – 2015-01-05 (×12): 5 mg via ORAL
  Filled 2014-12-30 (×13): qty 1

## 2014-12-30 MED ORDER — LEVOTHYROXINE SODIUM 125 MCG PO TABS
187.5000 ug | ORAL_TABLET | Freq: Every day | ORAL | Status: DC
  Administered 2014-12-31 – 2015-01-05 (×6): 187.5 ug via ORAL
  Filled 2014-12-30 (×7): qty 1.5

## 2014-12-30 MED ORDER — SODIUM CHLORIDE 0.9 % IJ SOLN
3.0000 mL | Freq: Two times a day (BID) | INTRAMUSCULAR | Status: DC
  Administered 2014-12-30 – 2015-01-05 (×12): 3 mL via INTRAVENOUS

## 2014-12-30 MED ORDER — ROSUVASTATIN CALCIUM 10 MG PO TABS
10.0000 mg | ORAL_TABLET | Freq: Every day | ORAL | Status: DC
  Administered 2014-12-31 – 2015-01-05 (×6): 10 mg via ORAL
  Filled 2014-12-30 (×6): qty 1

## 2014-12-30 MED ORDER — CARVEDILOL 6.25 MG PO TABS
6.2500 mg | ORAL_TABLET | Freq: Two times a day (BID) | ORAL | Status: DC
  Administered 2014-12-30 – 2015-01-05 (×12): 6.25 mg via ORAL
  Filled 2014-12-30 (×14): qty 1

## 2014-12-30 MED ORDER — VITAMIN D3 25 MCG (1000 UNIT) PO TABS
2000.0000 [IU] | ORAL_TABLET | Freq: Every day | ORAL | Status: DC
  Administered 2014-12-31 – 2015-01-05 (×6): 2000 [IU] via ORAL
  Filled 2014-12-30 (×6): qty 2

## 2014-12-30 MED ORDER — HYDRALAZINE HCL 10 MG PO TABS
10.0000 mg | ORAL_TABLET | Freq: Three times a day (TID) | ORAL | Status: DC
  Administered 2014-12-30 – 2015-01-05 (×16): 10 mg via ORAL
  Filled 2014-12-30 (×20): qty 1

## 2014-12-30 MED ORDER — ACETAMINOPHEN 325 MG PO TABS: 650.0000 mg | ORAL_TABLET | ORAL | Status: DC | PRN

## 2014-12-30 MED ORDER — INSULIN GLARGINE 100 UNIT/ML ~~LOC~~ SOLN
50.0000 [IU] | Freq: Two times a day (BID) | SUBCUTANEOUS | Status: DC
  Administered 2014-12-30 – 2014-12-31 (×3): 50 [IU] via SUBCUTANEOUS
  Filled 2014-12-30 (×5): qty 0.5

## 2014-12-30 MED ORDER — OMEGA-3 FATTY ACIDS 1000 MG PO CAPS: 1.0000 | ORAL_CAPSULE | Freq: Three times a day (TID) | ORAL | Status: DC

## 2014-12-30 MED ORDER — ONDANSETRON HCL 4 MG/2ML IJ SOLN: 4.0000 mg | Freq: Four times a day (QID) | INTRAMUSCULAR | Status: DC | PRN

## 2014-12-30 MED ORDER — FUROSEMIDE 10 MG/ML IJ SOLN
80.0000 mg | Freq: Three times a day (TID) | INTRAMUSCULAR | Status: DC
  Administered 2014-12-30 – 2015-01-04 (×15): 80 mg via INTRAVENOUS
  Filled 2014-12-30 (×19): qty 8

## 2014-12-30 MED ORDER — POTASSIUM CHLORIDE CRYS ER 20 MEQ PO TBCR
40.0000 meq | EXTENDED_RELEASE_TABLET | Freq: Three times a day (TID) | ORAL | Status: DC
  Administered 2014-12-30 – 2015-01-05 (×18): 40 meq via ORAL
  Filled 2014-12-30 (×21): qty 2

## 2014-12-30 MED ORDER — SODIUM CHLORIDE 0.9 % IJ SOLN: 3.0000 mL | INTRAMUSCULAR | Status: DC | PRN

## 2014-12-30 MED ORDER — LEVOTHYROXINE SODIUM 150 MCG PO TABS
150.0000 ug | ORAL_TABLET | Freq: Every day | ORAL | Status: DC
Start: 2014-12-31 — End: 2014-12-30

## 2014-12-30 MED ORDER — MEXILETINE HCL 150 MG PO CAPS
150.0000 mg | ORAL_CAPSULE | Freq: Three times a day (TID) | ORAL | Status: DC
  Administered 2014-12-30 – 2015-01-05 (×17): 150 mg via ORAL
  Filled 2014-12-30 (×19): qty 1

## 2014-12-30 MED ORDER — SODIUM CHLORIDE 0.9 % IV SOLN: 250.0000 mL | INTRAVENOUS | Status: DC | PRN

## 2014-12-30 MED ORDER — INSULIN ASPART 100 UNIT/ML ~~LOC~~ SOLN
0.0000 [IU] | Freq: Three times a day (TID) | SUBCUTANEOUS | Status: DC
  Administered 2014-12-31 – 2015-01-02 (×2): 2 [IU] via SUBCUTANEOUS
  Administered 2015-01-03: 3 [IU] via SUBCUTANEOUS
  Administered 2015-01-03: 5 [IU] via SUBCUTANEOUS
  Administered 2015-01-04 – 2015-01-05 (×4): 2 [IU] via SUBCUTANEOUS

## 2014-12-30 MED ORDER — LEVOTHYROXINE SODIUM 75 MCG PO TABS: 37.5000 ug | ORAL_TABLET | Freq: Every day | ORAL | Status: DC

## 2014-12-30 MED ORDER — TAMSULOSIN HCL 0.4 MG PO CAPS
0.4000 mg | ORAL_CAPSULE | Freq: Every day | ORAL | Status: DC
  Administered 2014-12-31 – 2015-01-05 (×6): 0.4 mg via ORAL
  Filled 2014-12-30 (×6): qty 1

## 2014-12-30 MED ORDER — ISOSORBIDE MONONITRATE ER 30 MG PO TB24
30.0000 mg | ORAL_TABLET | Freq: Every day | ORAL | Status: DC
  Administered 2014-12-31 – 2015-01-05 (×6): 30 mg via ORAL
  Filled 2014-12-30 (×6): qty 1

## 2014-12-30 NOTE — ED Provider Notes (Signed)
CSN: [id number]     Arrival date & time 12/30/14  1117 History   First MD Initiated Contact with Patient 12/30/14 1118     Chief Complaint  Patient presents with  . Shortness of Breath     (Consider location/radiation/quality/duration/timing/severity/associated sxs/prior Treatment) HPI Comments: Patient with past medical history of CAD, CK ED, CHF, hypertension, obesity, diabetes, presents to the emergency department with chief complaint of shortness of breath 1 week. He reports increasing shortness of breath. He states that the shortness of breath is normally relieved with Lasix and with sitting up in bed, but patient states that his symptoms have been worsening. He reports associated exertional shortness of breath. Denies any chest pain. Denies any fevers, chills, or cough. He does report increased swelling of his hands, abdomen, and lower extremities. He states that he has gained approximately 5 pounds. Patient states that he has been taking his medications as directed. He is anticoagulated with Eliquis.  Cardiologist: Dr. Antoine Poche  The history is provided by the patient. No language interpreter was used.    Past Medical History  Diagnosis Date  . HYPERLIPIDEMIA-MIXED   . Obesity, unspecified   . HYPERTENSION, UNSPECIFIED   . CAD (coronary artery disease)     a. Nonobstructive moderate CAD by cath - last 2012.  [name] Hypertrophic obstructive cardiomyopathy   . Complete heart block     a. s/p pacemaker later upgraded to CRT-D (MDT).  . CKD (chronic kidney disease), stage IV     a. Baseline Cr 1.4-1.5 prior to 09/2014-->2.3-2.8 during admission 09/2014.  [name] Chronic systolic CHF (congestive heart failure)     a. NICM (EF 25% in 2012, 20-25% in 09/2013). b. s/p BiV-ICD (MDT);  c. 10/2014 EF 25%.  . Colon polyps   . Ventricular tachycardia     a. EPS/RFCA of VT in 2012. b. On amio, mexilitene. c. Last BiV-ICD gen change 05/2013 (MDT).  . Hypothyroidism   . Fatty liver disease, nonalcoholic    . BPH (benign prostatic hyperplasia)   . Habitual alcohol use   . Liver cirrhosis   . 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 DM     insulin dependent   . Left ventricular apical thrombus     a. 10/2014 Echo: 1.5x1.5 cm apical clot-->on eliquis.  [name] NICM (nonischemic cardiomyopathy)     a. 10/2014 Echo: EF 25%, diff HK, 1.5 x 1.5 cm apical clot, mod dil LA.   Past Surgical History  Procedure Laterality Date  . Pacemaker insertion      medtronic  . Cardiac catheterization    . Facial reconstruction surgery      after trauma in 2006  . Bi-ventricular implantable cardioverter defibrillator N/A 05/25/2013    Procedure: BI-VENTRICULAR IMPLANTABLE CARDIOVERTER DEFIBRILLATOR  (CRT-D);  Surgeon: Marinus Maw, MD;  Location: [facility] CATH LAB;  Service: Cardiovascular;  Laterality: N/A;  . Back surgery     Family History  Problem Relation Age of Onset  . Diabetic kidney disease Father   . Coronary artery disease Neg Hx    History  Substance Use Topics  . Smoking status: Former Smoker    Types: Cigarettes    Quit date: 10/04/1968  . Smokeless tobacco: Never Used     Comment: 30+ yrs  . Alcohol Use: Yes     Comment: about 1 cup of liquor daily (straight liquor - not mixed)    Review of Systems  Constitutional: Negative for fever and chills.  Respiratory: Positive  for shortness of breath.   Cardiovascular: Positive for leg swelling. Negative for chest pain.  Gastrointestinal: Negative for nausea, vomiting, diarrhea and constipation.  Genitourinary: Negative for dysuria.  All other systems reviewed and are negative.     Allergies  Ace inhibitors; Nitroglycerin; Procan sr; and Quinidine  Home Medications   Prior to Admission medications   Medication Sig Start Date End Date Taking? Authorizing Provider  apixaban (ELIQUIS) 5 MG TABS tablet Take 1 tablet (5 mg total) by mouth 2 (two) times daily. 10/17/14   Ok Anis, NP  carvedilol (COREG)  6.25 MG tablet Take 1 tablet (6.25 mg total) by mouth 2 (two) times daily with a meal. 10/11/14   Jeralyn Bennett, MD  cholecalciferol (VITAMIN D) 1000 UNITS tablet Take 2,000 Units by mouth daily.     Historical Provider, MD  fish oil-omega-3 fatty acids 1000 MG capsule Take 1 capsule by mouth 3 (three) times daily.     Historical Provider, MD  furosemide (LASIX) 40 MG tablet Take 3 tablets (120 mg total) by mouth 2 (two) times daily. Patient taking differently: Take 120 mg by mouth 2 (two) times daily. 120 am , 80 in pm 10/11/14   Jeralyn Bennett, MD  hydrALAZINE (APRESOLINE) 10 MG tablet Take 1 tablet (10 mg total) by mouth every 8 (eight) hours. 12/24/14   Ernestina Penna, MD  insulin glargine (LANTUS) 100 UNIT/ML injection Inject 50 Units into the skin 2 (two) times daily.  07/25/14   Tammy Eckard, PHARMD  insulin lispro (HUMALOG KWIKPEN) 100 UNIT/ML KiwkPen Inject 16 units with largest/evening meal.Dispense quikpens Patient taking differently: Inject 16 units with largest/evening meal.Dispense quikpens 02/28/14   Ernestina Penna, MD  isosorbide mononitrate (IMDUR) 30 MG 24 hr tablet Take 1 tablet (30 mg total) by mouth daily. 12/24/14   Ernestina Penna, MD  levothyroxine (SYNTHROID, LEVOTHROID) 75 MCG tablet TAKE 1/2 TABLET (=37.5MCG) ALONG WITH LEVOTHYROXINE TO EQUAL 187. DAILY. Patient taking differently: TAKE 1/2 TABLET (=37.5MCG) BY MOUTH ALONG WITH LEVOTHYROXINE TO EQUAL 187. DAILY. 08/05/14   Ernestina Penna, MD  linagliptin (TRADJENTA) 5 MG TABS tablet Take 1 tablet (5 mg total) by mouth daily. 12/24/14   Ernestina Penna, MD  mexiletine (MEXITIL) 150 MG capsule TAKE 1 CAPSULE EVERY 8 HOURS Patient taking differently: TAKE 1 CAPSULE BY MOUTH EVERY 8 HOURS 02/28/14   Ernestina Penna, MD  rosuvastatin (CRESTOR) 20 MG tablet Take 0.5 tablets (10 mg total) by mouth daily. 10/14/14   Ernestina Penna, MD  tamsulosin (FLOMAX) 0.4 MG CAPS capsule Take 1 capsule (0.4 mg total) by mouth daily.  12/24/14   Ernestina Penna, MD   Ht  (1.854 m)  SpO2 96% Physical Exam  Constitutional: He is oriented to person, place, and time. He appears well-developed and well-nourished.  HENT:  Head: Normocephalic and atraumatic.  Eyes: Conjunctivae and EOM are normal. Pupils are equal, round, and reactive to light. Right eye exhibits no discharge. Left eye exhibits no discharge. No scleral icterus.  Neck: Normal range of motion. Neck supple. No JVD present.  Cardiovascular: Normal rate, regular rhythm and normal heart sounds.  Exam reveals no gallop and no friction rub.   No murmur heard. Pulmonary/Chest: Effort normal and breath sounds normal. No respiratory distress. He has no wheezes. He has no rales. He exhibits no tenderness.  Abdominal: Soft. He exhibits distension. He exhibits no mass. There is no tenderness. There is no rebound and no guarding.  Moderate  abdominal distention, but no pain with palpation  Musculoskeletal: Normal range of motion. He exhibits edema. He exhibits no tenderness.  2+ pitting edema of lower extremities  Neurological: He is alert and oriented to person, place, and time.  Skin: Skin is warm and dry.  Psychiatric: He has a normal mood and affect. His behavior is normal. Judgment and thought content normal.  Nursing note and vitals reviewed.   ED Course  Procedures (including critical care time) Results for orders placed or performed during the hospital encounter of 12/30/14  Brain natriuretic peptide  Result Value Ref Range   B Natriuretic Peptide 1273.1 (H) 0.0 - 100.0 pg/mL  CBC with Differential/Platelet  Result Value Ref Range   WBC 5.7 4.0 - 10.5 K/uL   RBC 3.75 (L) 4.22 - 5.81 MIL/uL   Hemoglobin 10.7 (L) 13.0 - 17.0 g/dL   HCT 16.1 (L) 09.6 - 04.5 %   MCV 87.7 78.0 - 100.0 fL   MCH 28.5 26.0 - 34.0 pg   MCHC 32.5 30.0 - 36.0 g/dL   RDW 40.9 (H) 81.1 - 91.4 %   Platelets 76 (L) 150 - 400 K/uL   Neutrophils Relative % 69 43 - 77 %   Neutro Abs  4.0 1.7 - 7.7 K/uL   Lymphocytes Relative 16 12 - 46 %   Lymphs Abs 0.9 0.7 - 4.0 K/uL   Monocytes Relative 12 3 - 12 %   Monocytes Absolute 0.7 0.1 - 1.0 K/uL   Eosinophils Relative 3 0 - 5 %   Eosinophils Absolute 0.2 0.0 - 0.7 K/uL   Basophils Relative 0 0 - 1 %   Basophils Absolute 0.0 0.0 - 0.1 K/uL  Basic metabolic panel  Result Value Ref Range   Sodium 140 135 - 145 mmol/L   Potassium 3.6 3.5 - 5.1 mmol/L   Chloride 103 96 - 112 mmol/L   CO2 25 19 - 32 mmol/L   Glucose, Bld 66 (L) 70 - 99 mg/dL   BUN 43 (H) 6 - 23 mg/dL   Creatinine, Ser 7.82 (H) 0.50 - 1.35 mg/dL   Calcium 9.3 8.4 - 95.6 mg/dL   GFR calc non Af Amer 28 (L) >90 mL/min   GFR calc Af Amer 32 (L) >90 mL/min   Anion gap 12 5 - 15  Troponin I  Result Value Ref Range   Troponin I 0.51 (HH) <0.031 ng/mL  I-stat troponin, ED  Result Value Ref Range   Troponin i, poc 0.16 (HH) 0.00 - 0.08 ng/mL   Comment NOTIFIED PHYSICIAN    Comment 3          POC CBG, ED  Result Value Ref Range   Glucose-Capillary 70 70 - 99 mg/dL   Dg Chest 2 View  11/17/863   CLINICAL DATA:  Retained fluid, swollen belly and feet.  EXAM: CHEST  2 VIEW  COMPARISON:  10/11/2014  FINDINGS: Bilateral interstitial thickening with prominence of the central pulmonary vasculature. Trace bilateral pleural effusions. No pneumothorax. Stable cardiomegaly. There is a cardiac pacemaker present.  Osseous structures are unremarkable.  IMPRESSION: Cardiomegaly with pulmonary vascular congestion.   Electronically Signed   By: Elige Ko   On: 12/30/2014 11:59     Imaging Review No results found.   EKG Interpretation   Date/Time:  Monday December 30 2014 11:18:27 EDT Ventricular Rate:  73 PR Interval:  172 QRS Duration: 199 QT Interval:  522 QTC Calculation: 575 R Axis:   -68 Text Interpretation:  Sinus  rhythm Nonspecific IVCD with LAD Abnormal T,  consider ischemia, lateral leads AV dual-paced rhythm Confirmed by  ZACKOWSKI  MD, SCOTT ([id number])  on 12/30/2014 11:21:22 AM      MDM   Final diagnoses:  SOB (shortness of breath)  CHF exacerbation    Patient with long history of CHF and multiple cardiac and other health problems. Suspect CHF exacerbation. Will check labs, will reassess.  Suspect that the patient will require admission.  Troponin is 0.16, Dr. Deretha Emory notified.  BNP is elevated to greater than 1200, suspect the troponin is reflective of some right heart strain. Will consult cardiology.  Patient discussed with Dr. Deretha Emory, who agrees with the plan.  Patient will be admitted to cardiology.    Roxy Horseman, PA-C 12/30/14 1613  Vanetta Mulders, MD 12/31/14 [phone]

## 2014-12-30 NOTE — ED Notes (Signed)
Critical troponin of 0.51 received from lab. EDP aware.

## 2014-12-30 NOTE — ED Notes (Signed)
Patient given Ginger ale, Admitting states okay to give.

## 2014-12-30 NOTE — ED Notes (Signed)
Cardiology consult at bedside.

## 2014-12-30 NOTE — ED Notes (Signed)
Per EMS, patient c/o SOB x 1 week. Patient reports approx. Gain of 5 pounds, abdomen distention, edema noted to hands and feet. Patient has on demand pacemaker, unsure what kind. No pain reported. NAD. VSS.

## 2014-12-30 NOTE — ED Notes (Signed)
Critical I stat result reported to Dr Deretha Emory

## 2014-12-30 NOTE — H&P (Signed)
Patient ID: [name] MRN: [id number], DOB/AGE: 03-31-1956   Admit date: 12/30/2014   Primary Physician: Dakota Heap, MD Primary Cardiologist: Dr. Antoine Holloway  Pt. Profile:  59 year old Caucasian male with PMH of nonobstructive CAD by cath 2012, stage IV CKD, history of chronic systolic heart failure with baseline EF 25%, HTN, DM, history of complete heart block s/p CRT-D in 2012 with generator change in 2014, history of VT s/p EPS/RFCA in 2012 currently on mexiletine, history of EtOH abuse, and a history of hypothyroidism present with increasing LE edema and SOB for past month.  Problem List  Past Medical History  Diagnosis Date  . HYPERLIPIDEMIA-MIXED   . Obesity, unspecified   . HYPERTENSION, UNSPECIFIED   . CAD (coronary artery disease)     a. Nonobstructive moderate CAD by cath - last 2012.  [name] Hypertrophic obstructive cardiomyopathy   . Complete heart block     a. s/p pacemaker later upgraded to CRT-D (MDT).  . CKD (chronic kidney disease), stage IV     a. Baseline Cr 1.4-1.5 prior to 09/2014-->2.3-2.8 during admission 09/2014.  [name] Chronic systolic CHF (congestive heart failure)     a. NICM (EF 25% in 2012, 20-25% in 09/2013). b. s/p BiV-ICD (MDT);  c. 10/2014 EF 25%.  . Colon polyps   . Ventricular tachycardia     a. EPS/RFCA of VT in 2012. b. On amio, mexilitene. c. Last BiV-ICD gen change 05/2013 (MDT).  . Hypothyroidism   . Fatty liver disease, nonalcoholic   . BPH (benign prostatic hyperplasia)   . Habitual alcohol use   . Liver cirrhosis   . 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 DM     insulin dependent   . Left ventricular apical thrombus     a. 10/2014 Echo: 1.5x1.5 cm apical clot-->on eliquis.  [name] NICM (nonischemic cardiomyopathy)     a. 10/2014 Echo: EF 25%, diff HK, 1.5 x 1.5 cm apical clot, mod dil LA.    Past Surgical History  Procedure Laterality Date  . Pacemaker insertion      medtronic  . Cardiac catheterization     . Facial reconstruction surgery      after trauma in 2006  . Bi-ventricular implantable cardioverter defibrillator N/A 05/25/2013    Procedure: BI-VENTRICULAR IMPLANTABLE CARDIOVERTER DEFIBRILLATOR  (CRT-D);  Surgeon: Dakota Maw, MD;  Location: [facility] CATH LAB;  Service: Cardiovascular;  Laterality: N/A;  . Back surgery       Allergies  Allergies  Allergen Reactions  . Ace Inhibitors     AVOID ALL  BECAUSE OF HEART DISEASE   . Nitroglycerin Other (See Comments)    Reaction unknown  . Procan Sr [Procainamide Hcl]     PT. HAS HEART DISEASE CALLED HYPERTHROPHIC CARDIOMYOPATHY  . Quinidine     PT. HAS HEART DISEASE CALLED HYPERTHROPHIC CARDIOMYOPATHY      HPI  [name] is a 59 year old Caucasian male with PMH of nonobstructive CAD by cath 2012, stage IV CKD, history of chronic systolic heart failure with baseline EF 25%, HTN, DM, history of complete heart block s/p CRT-D in 2012 with generator change in 2014, history of VT s/p EPS/RFCA in 2012 currently on mexiletine, history of EtOH abuse, and a history of hypothyroidism. His last cardiac catheterization in 2012 revealed nonobstructive CAD, it was felt to be mainly nonischemic cardiomyopathy. He had CRT-D placed in the same year for complete heart block with a generator change in 2014. He has been shocked  previously, however has not been shocked in the last year. Patient has been admitted multiple times since 2015. His last admission was in January 2016. He is currently on Coreg, hydralazine and long-acting nitrate. According to the patient, he has been compliant with his medication, he also watch his diet and limited amount of fluid intake. Since his discharge in January, he was initially doing well, however for the past month, he has been noticing increasing lower extremity and abdominal swelling. It is unclear at this time how much Lasix he's been taking, according to records, he is on 120 mg twice a day of oral Lasix, however patient  think he has been taking 3 times daily at home. Of note, his wife manages his medication. It is questionable that he has been confusing with frequency of hydralazine at home. He denies any recent chest discomfort. For the past week, his lower extremity swelling has increased, he also noted his daily weight has been creeping up. For the last 2 days he has noticed significant orthopnea and paroxysmal nocturnal dyspnea, prompting the patient to seek medical attention at [facility] on 12/21/24 2016. Of note, his previous discharge weight in January 2016 was 295 pounds, his current weight is 301 pounds.  On arrival to [facility], his blood pressure was 100/62. O2 saturation 96% on room air. Heart rate in the 60s to 70s. Significant laboratory finding include BNP 1273, where as it was 700 two month ago. Creatinine 2.41, point-of-care troponin 0.16, subsequent troponin 0.51, hemoglobin 10.7. Chest x-ray revealed pulmonary vascular congestion. EKG showed ventricularly paced rhythm. Cardiology has been consulted for heart failure and elevated troponin.  Home Medications  Prior to Admission medications   Medication Sig Start Date End Date Taking? Authorizing Provider  apixaban (ELIQUIS) 5 MG TABS tablet Take 1 tablet (5 mg total) by mouth 2 (two) times daily. 10/17/14   Dakota Anis, NP  carvedilol (COREG) 6.25 MG tablet Take 1 tablet (6.25 mg total) by mouth 2 (two) times daily with a meal. 10/11/14   Dakota Bennett, MD  cholecalciferol (VITAMIN D) 1000 UNITS tablet Take 2,000 Units by mouth daily.     Historical Provider, MD  fish oil-omega-3 fatty acids 1000 MG capsule Take 1 capsule by mouth 3 (three) times daily.     Historical Provider, MD  furosemide (LASIX) 40 MG tablet Take 3 tablets (120 mg total) by mouth 2 (two) times daily. Patient taking differently: Take 120 mg by mouth 2 (two) times daily. 120 am , 80 in pm 10/11/14   Dakota Bennett, MD  hydrALAZINE (APRESOLINE) 10 MG tablet  Take 1 tablet (10 mg total) by mouth every 8 (eight) hours. 12/24/14   Dakota Penna, MD  insulin glargine (LANTUS) 100 UNIT/ML injection Inject 50 Units into the skin 2 (two) times daily.  07/25/14   Tammy Eckard, PHARMD  insulin lispro (HUMALOG KWIKPEN) 100 UNIT/ML KiwkPen Inject 16 units with largest/evening meal.Dispense quikpens Patient taking differently: Inject 16 units with largest/evening meal.Dispense quikpens 02/28/14   Dakota Penna, MD  isosorbide mononitrate (IMDUR) 30 MG 24 hr tablet Take 1 tablet (30 mg total) by mouth daily. 12/24/14   Dakota Penna, MD  levothyroxine (SYNTHROID, LEVOTHROID) 75 MCG tablet TAKE 1/2 TABLET (=37.5MCG) ALONG WITH LEVOTHYROXINE TO EQUAL 187. DAILY. Patient taking differently: TAKE 1/2 TABLET (=37.5MCG) BY MOUTH ALONG WITH LEVOTHYROXINE TO EQUAL 187. DAILY. 08/05/14   Dakota Penna, MD  linagliptin (TRADJENTA) 5 MG TABS tablet Take 1  tablet (5 mg total) by mouth daily. 12/24/14   Dakota Penna, MD  mexiletine (MEXITIL) 150 MG capsule TAKE 1 CAPSULE EVERY 8 HOURS Patient taking differently: TAKE 1 CAPSULE BY MOUTH EVERY 8 HOURS 02/28/14   Dakota Penna, MD  rosuvastatin (CRESTOR) 20 MG tablet Take 0.5 tablets (10 mg total) by mouth daily. 10/14/14   Dakota Penna, MD  tamsulosin (FLOMAX) 0.4 MG CAPS capsule Take 1 capsule (0.4 mg total) by mouth daily. 12/24/14   Dakota Penna, MD    Family History  Family History  Problem Relation Age of Onset  . Diabetic kidney disease Father   . Coronary artery disease Neg Hx     Social History  History   Social History  . Marital Status: Married    Spouse Name: N/A  . Number of Children: N/A  . Years of Education: N/A   Occupational History  . Not on file.   Social History Main Topics  . Smoking status: Former Smoker    Types: Cigarettes    Quit date: 10/04/1968  . Smokeless tobacco: Never Used     Comment: 30+ yrs  . Alcohol Use: Yes     Comment: about 1 cup of liquor  daily (straight liquor - not mixed)  . Drug Use: No  . Sexual Activity: Not on file   Other Topics Concern  . Not on file   Social History Narrative     Review of Systems General:  No chills, fever, night sweats or weight changes.  Cardiovascular:  No chest pain +dyspnea on exertion, edema, orthopnea, paroxysmal nocturnal dyspnea. Dermatological: No rash, lesions/masses Respiratory: No cough +dyspnea Urologic: No hematuria, dysuria Abdominal:   No nausea, vomiting, diarrhea, bright red blood per rectum, melena, or hematemesis Neurologic:  No visual changes, wkns, changes in mental status. All other systems reviewed and are otherwise negative except as noted above.  Physical Exam  Blood pressure 115/64, pulse 64, temperature 98.1 F (36.7 C), resp. rate 12, height  (1.854 m), weight 301 lb (136.533 kg), SpO2 100 %.  General: Pleasant, NAD Psych: Normal affect. Neuro: Alert and oriented X 3. Moves all extremities spontaneously. HEENT: Normal  Neck: Supple without bruits +JVD. Lungs:  Resp regular and unlabored. Bibasilar rale. ICD noted Heart: RRR no s3, s4, or murmurs. Abdomen: Soft, non-tender.  Abdomen distended.  Extremities: No clubbing, cyanosis. DP/PT/Radials 2+ and equal bilaterally. 1-2+ pitting edema in LE and bilateral feet.   Labs  Troponin [facility] Test)  Recent Labs  12/30/14 1202  TROPIPOC 0.16*    Recent Labs  12/30/14 1151  TROPONINI 0.51*   Lab Results  Component Value Date   WBC 5.7 12/30/2014   HGB 10.7* 12/30/2014   HCT 32.9* 12/30/2014   MCV 87.7 12/30/2014   PLT 76* 12/30/2014    Recent Labs Lab 12/30/14 1155  NA 140  K 3.6  CL 103  CO2 25  BUN 43*  CREATININE 2.41*  CALCIUM 9.3  GLUCOSE 66*   Lab Results  Component Value Date   CHOL Comment 12/19/2014   HDL Comment 12/19/2014   LDLCALC 73 07/04/2014   TRIG Comment 12/19/2014   No results found for: DDIMER   Radiology/Studies  Dg Chest 2 View  12/30/2014    CLINICAL DATA:  Retained fluid, swollen belly and feet.  EXAM: CHEST  2 VIEW  COMPARISON:  10/11/2014  FINDINGS: Bilateral interstitial thickening with prominence of the central pulmonary vasculature. Trace bilateral pleural effusions. No  pneumothorax. Stable cardiomegaly. There is a cardiac pacemaker present.  Osseous structures are unremarkable.  IMPRESSION: Cardiomegaly with pulmonary vascular congestion.   Electronically Signed   By: Elige Ko   On: 12/30/2014 11:59    ECG  Ventricularly paced rhythm  Echocardiogram 09/01/2014  LV EF: 25%  ------------------------------------------------------------------- Indications:   CHF - 428.0.  ------------------------------------------------------------------- Study Conclusions  - Left ventricle: There is 1.5cm x 1.5cm clot in the apex. By report, this clot has been seen in the past. The cavity size was moderately to severely dilated. Wall thickness was increased in a pattern of moderate to severe LVH. The estimated ejection fraction was 25%. Diffuse hypokinesis. Doppler parameters are consistent with high ventricular filling pressure. - Left atrium: The atrium was moderately dilated. - Right ventricle: There is some RV dysfunction. Not able to assess RV function due to poor visualization. Not able to assess RV size due to poor visualization.    ASSESSMENT AND PLAN  1. Acute on chronic systolic heart failure w/ baseline EF 25%  - patient states he has been compliant with fluid restriction, Na restriction and daily weight. Weight has been increasing for past week. Swelling has been going on for a month.  - start 80mg  IV lasix TID. Can consider torsemide on discharge vs lasix with PRN metolazone  - Discussed with MD, If diuresis unsuccessful, will consider metolazone vs temp inotropic therapy to help with diuresis.  2. Elevated trop: denies any CP recently  - likely demand ischemia in the setting HF. Will trend,  unless significant jump, would not hold eliquis and do ischemic workup.  3. nonobstructive CAD by cath 2012  - no recent angina  4. Acute on chronic stage IV CKD 5. HTN 6. DM 7. history of complete heart block s/p CRT-D in 2012 with generator change in 2014 8. history of VT s/p EPS/RFCA in 2012 currently on mexiletine 9. history of EtOH abuse 10. history of hypothyroidism 11. H/o LV clot: on eliquis. Symptom does not appear to be consistent with ACS, discussed with MD, will continue eliquis for now.   Ramond Dial, PA-C 12/30/2014, 2:27 PM

## 2014-12-30 NOTE — ED Notes (Signed)
Patient transported to X-ray 

## 2014-12-30 NOTE — ED Notes (Signed)
Pt placed on transport monitor and taken to 3w. Denies cp . Alert and oriented

## 2014-12-31 LAB — BASIC METABOLIC PANEL
Anion gap: 8 (ref 5–15)
BUN: 41 mg/dL — ABNORMAL HIGH (ref 6–23)
CO2: 26 mmol/L (ref 19–32)
Calcium: 9.2 mg/dL (ref 8.4–10.5)
Chloride: 106 mmol/L (ref 96–112)
Creatinine, Ser: 2.16 mg/dL — ABNORMAL HIGH (ref 0.50–1.35)
GFR calc Af Amer: 37 mL/min — ABNORMAL LOW (ref >90)
GFR, EST NON AFRICAN AMERICAN: 32 mL/min — AB (ref >90)
GLUCOSE: 73 mg/dL (ref 70–99)
POTASSIUM: 4.1 mmol/L (ref 3.5–5.1)
Sodium: 140 mmol/L (ref 135–145)

## 2014-12-31 LAB — GLUCOSE, CAPILLARY
GLUCOSE-CAPILLARY: 92 mg/dL (ref 70–99)
Glucose-Capillary: 55 mg/dL — ABNORMAL LOW (ref 70–99)
Glucose-Capillary: 82 mg/dL (ref 70–99)
Glucose-Capillary: 125 mg/dL — ABNORMAL HIGH (ref 70–99)
Glucose-Capillary: 75 mg/dL (ref 70–99)

## 2014-12-31 LAB — TROPONIN I: Troponin I: 0.48 ng/mL — ABNORMAL HIGH (ref <0.031)

## 2014-12-31 MED ORDER — METOLAZONE 2.5 MG PO TABS
2.5000 mg | ORAL_TABLET | Freq: Once | ORAL | Status: AC
  Administered 2014-12-31: 2.5 mg via ORAL
  Filled 2014-12-31: qty 1

## 2014-12-31 NOTE — Progress Notes (Signed)
Inpatient Diabetes Program Recommendations  AACE/ADA: New Consensus Statement on Inpatient Glycemic Control (2013)  Target Ranges:  Prepandial:   less than 140 mg/dL      Peak postprandial:   less than 180 mg/dL (1-2 hours)      Critically ill patients:  140 - 180 mg/dL  Results for TOAN, MUSKA (MRN [id number]) as of 12/31/2014 07:35  Ref. Range 12/30/2014 16:10 12/30/2014 21:29 12/31/2014 04:24 12/31/2014 05:44  Glucose-Capillary Latest Range: 70-99 mg/dL 70 202 (H) 55 (L) 82   Outpatient Diabetes medications: Lantus 50 units BID, Humalog 16 units with supper. (per med rec.) Current orders for Inpatient glycemic control: Lantus 50 BID, Novolog mod scale TID.   MD, please reduce dose of Lantus bc of hypoglycemia.  Recommend 1/2 home dose and continue to monitor today for further adjustment needs.  Thank you  Piedad Climes BSN, RN,CDE Inpatient Diabetes Coordinator [phone] (team pager)

## 2014-12-31 NOTE — Progress Notes (Signed)
Heart Failure Navigator Consult Note  Presentation: [name] is a 59 year old Caucasian male with PMH of nonobstructive CAD by cath 2012, stage IV CKD, history of chronic systolic heart failure with baseline EF 25%, HTN, DM, history of complete heart block s/p CRT-D in 2012 with generator change in 2014, history of VT s/p EPS/RFCA in 2012 currently on mexiletine, history of EtOH abuse, and a history of hypothyroidism present with increasing LE edema and SOB for past month.  Past Medical History  Diagnosis Date  . HYPERLIPIDEMIA-MIXED   . Obesity, unspecified   . HYPERTENSION, UNSPECIFIED   . CAD (coronary artery disease)     a. Nonobstructive moderate CAD by cath - last 2012.  [name] Hypertrophic obstructive cardiomyopathy   . Complete heart block     a. s/p pacemaker later upgraded to CRT-D (MDT).  . CKD (chronic kidney disease), stage IV     a. Baseline Cr 1.4-1.5 prior to 09/2014-->2.3-2.8 during admission 09/2014.  [name] Chronic systolic CHF (congestive heart failure)     a. NICM (EF 25% in 2012, 20-25% in 09/2013). b. s/p BiV-ICD (MDT);  c. 10/2014 EF 25%.  . Colon polyps   . Ventricular tachycardia     a. EPS/RFCA of VT in 2012. b. On amio, mexilitene. c. Last BiV-ICD gen change 05/2013 (MDT).  . Hypothyroidism   . Fatty liver disease, nonalcoholic   . BPH (benign prostatic hyperplasia)   . Habitual alcohol use   . Liver cirrhosis   . 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 DM     insulin dependent   . Left ventricular apical thrombus     a. 10/2014 Echo: 1.5x1.5 cm apical clot-->on eliquis.  [name] NICM (nonischemic cardiomyopathy)     a. 10/2014 Echo: EF 25%, diff HK, 1.5 x 1.5 cm apical clot, mod dil LA.    History   Social History  . Marital Status: Married    Spouse Name: N/A  . Number of Children: N/A  . Years of Education: N/A   Social History Main Topics  . Smoking status: Former Smoker    Types: Cigarettes    Quit date: 10/04/1968  .  Smokeless tobacco: Never Used     Comment: 30+ yrs  . Alcohol Use: Yes     Comment: about 1 cup of liquor daily (straight liquor - not mixed)  . Drug Use: No  . Sexual Activity: Not on file   Other Topics Concern  . None   Social History Narrative    ECHO:Study Conclusions--09/01/14  - Left ventricle: There is 1.5cm x 1.5cm clot in the apex. By report, this clot has been seen in the past. The cavity size was moderately to severely dilated. Wall thickness was increased in a pattern of moderate to severe LVH. The estimated ejection fraction was 25%. Diffuse hypokinesis. Doppler parameters are consistent with high ventricular filling pressure. - Left atrium: The atrium was moderately dilated. - Right ventricle: There is some RV dysfunction. Not able to assess RV function due to poor visualization. Not able to assess RV size due to poor visualization.  Transthoracic echocardiography. M-mode, complete 2D, spectral Doppler, and color Doppler. Birthdate: Patient birthdate: June 22, 1956. Age: Patient is 59 yr old. Sex: Gender: male. BMI: 41.9 kg/m^2. Blood pressure:   142/80 Patient status: Inpatient. Study date: Study date: 09/01/2014. Study time: 11:15 AM. Location: ICU/CCU  BNP    Component Value Date/Time   BNP 1273.1* 12/30/2014 1155   BNP 1060.0* 12/19/2014 6962  ProBNP    Component Value Date/Time   PROBNP 1552.0* 09/03/2014 0255     Education Assessment and Provision:  Detailed education and instructions provided on heart failure disease management including the following:  Signs and symptoms of Heart Failure When to call the physician Importance of daily weights Low sodium diet Fluid restriction Medication management Anticipated future follow-up appointments  Patient education given on each of the above topics.  Patient acknowledges understanding and acceptance of all instructions.  I spoke briefly with [name] regarding his  HF.  He lives home with his wife.  He has been educated regarding HF and is able to teach back most topics listed above.  He says that he tries his best to eat a low sodium diet and he weighs daily.  He had noticed that he was gaining weight and had more swelling lately.  I encouraged he or his wife to call concerning questions regarding HF after discharge if needed.    Education Materials:  "Living Better With Heart Failure" Booklet, Daily Weight Tracker Tool and Heart Failure Educational Video.   High Risk Criteria for Readmission and/or Poor Patient Outcomes:   EF <30%- yes 25%  2 or more admissions in 6 months- yes -3  Difficult social situation-No  Demonstrates medication noncompliance- No    Barriers of Care:   Knowledge and compliance   Discharge Planning:   Plans to discharge to home with wife.

## 2014-12-31 NOTE — Progress Notes (Signed)
Pt with low blood sugar overnight last night per chart. Pt with Lantus due tonight. Pt ate whole [place] sandwich and applesauce tonight before bed, CBG 92. Lantus given. Pt educated to notify RN if notices signs of low blood sugar. Will continue to monitor. Huel Coventry, RN

## 2014-12-31 NOTE — Progress Notes (Signed)
Patient: [name] / Admit Date: 12/30/2014 / Date of Encounter: 12/31/2014, 11:55 AM   Subjective: His breathing has improved that worsen with laying and better with sitting. Denies CP or palpitation. SOB with minimal exertion. LEE persists.   Objective: Telemetry: AV paced Physical Exam: Blood pressure 124/60, pulse 67, temperature 98.4 F (36.9 C), temperature source Oral, resp. rate 20, height 6' 0.99" (1.854 m), weight 299 lb 3.2 oz (135.716 kg), SpO2 97 %. General: Well developed, well nourished, obese in no acute distress. Head: Normocephalic, atraumatic, sclera non-icteric, no xanthomas, nares are without discharge. Neck: Negative for carotid bruits. + JVP. Lungs: Clear bilaterally to auscultation without wheezes or rales.  Breathing is unlabored. Heart: RRR S1 S2 without murmurs, rubs, or gallops.  Abdomen: Soft, non-tender, distended with normoactive bowel sounds. Extremities: No clubbing or cyanosis. 2 + pitting edema bilaterally.  Neuro: Alert and oriented X 3. Moves all extremities spontaneously. Psych:  Responds to questions appropriately with a normal affect.   Intake/Output Summary (Last 24 hours) at 12/31/14 1155 Last data filed at 12/31/14 0900  Gross per 24 hour  Intake   1060 ml  Output   1025 ml  Net     35 ml    Inpatient Medications:  . apixaban  5 mg Oral BID  . carvedilol  6.25 mg Oral BID WC  . cholecalciferol  2,000 Units Oral Daily  . furosemide  80 mg Intravenous 3 times per day  . hydrALAZINE  10 mg Oral 3 times per day  . insulin aspart  0-15 Units Subcutaneous TID WC  . insulin glargine  50 Units Subcutaneous BID  . isosorbide mononitrate  30 mg Oral Daily  . levothyroxine  187.5 mcg Oral QAC breakfast  . mexiletine  150 mg Oral TID  . omega-3 acid ethyl esters  1 g Oral TID  . potassium chloride  40 mEq Oral TID  . rosuvastatin  10 mg Oral Daily  . sodium chloride  3 mL Intravenous Q12H  . tamsulosin  0.4 mg Oral Daily   Infusions:     Labs:  Recent Labs  12/30/14 1155 12/31/14 0444  NA 140 140  K 3.6 4.1  CL 103 106  CO2 25 26  GLUCOSE 66* 73  BUN 43* 41*  CREATININE 2.41* 2.16*  CALCIUM 9.3 9.2    Recent Labs  12/30/14 1155  WBC 5.7  NEUTROABS 4.0  HGB 10.7*  HCT 32.9*  MCV 87.7  PLT 76*    Recent Labs  12/30/14 1151 12/30/14 1528 12/30/14 2106 12/31/14 0444  TROPONINI 0.51* 0.48* 0.45* 0.48*    Radiology/Studies:  Dg Chest 2 View  12/30/2014   CLINICAL DATA:  Retained fluid, swollen belly and feet.  EXAM: CHEST  2 VIEW  COMPARISON:  10/11/2014  FINDINGS: Bilateral interstitial thickening with prominence of the central pulmonary vasculature. Trace bilateral pleural effusions. No pneumothorax. Stable cardiomegaly. There is a cardiac pacemaker present.  Osseous structures are unremarkable.  IMPRESSION: Cardiomegaly with pulmonary vascular congestion.   Electronically Signed   By: Elige Ko   On: 12/30/2014 11:59     Assessment and Plan   1. Acute on chronic systolic heart failure with baseline EF 25% - Breathing has improved - Net I/O not as brisk as expected but -2lb - Cr improved to 2.16 from 2.41 yesterday - Continue Coreg 6.25mg  BID, Lasix 80mg  IV TID - Potasium 4.1 today. He is on K-Dur CR TID. Continue to monitor. - Will give dose  of metolazone 2.5mg  30 minutes before next dose of IV Lasix to see if we can pick up diuresis - Can consider torsemide on discharge vs lasix with PRN metolazone  2. Elevated troponin due to demand ischemia, without recent CP - relatively flat troponin trend - In absence of angina, anticipate continued medical therapy as he is not a good candidate for re-cath given renal insufficiency  - Continue BB, statin  3. Acute on chronic IV CKD - Cr 2.41 improved to 2.19 with diuresis  - Continue to monitor  4. History of LV clot. -Continue Eliquis  5. HTN - Stable - Continue Coreg and Hydralazine  6. History of complete heart block s/p CRT-D in  2012 with generator change in 2014  7. History of VT s/p EPS/RFCA in 2012 currently on mexiletine  Signed, Dakota Spies PA-C Patient seen and examined. I agree with the assessment and plan as detailed above. See also my additional thoughts below.   We will add metalazone and see if he diureses better. Hopefully continued improvement in renal function will be seen with diuresis.  Willa Rough, MD, [facility] 12/31/2014 1:46 PM

## 2014-12-31 NOTE — Progress Notes (Signed)
Patient alert oriented x4, denies pain, no shortness or breath, AV pace on the monitor. +3 lower ext. Edema. Will continue to monitor patient.

## 2015-01-01 ENCOUNTER — Inpatient Hospital Stay (HOSPITAL_COMMUNITY): Admission: RE | Admit: 2015-01-01 | Payer: BLUE CROSS/BLUE SHIELD | Source: Ambulatory Visit

## 2015-01-01 ENCOUNTER — Ambulatory Visit (HOSPITAL_COMMUNITY): Payer: BLUE CROSS/BLUE SHIELD

## 2015-01-01 DIAGNOSIS — I5023 Acute on chronic systolic (congestive) heart failure: Principal | ICD-10-CM

## 2015-01-01 LAB — BASIC METABOLIC PANEL
Anion gap: 7 (ref 5–15)
BUN: 40 mg/dL — ABNORMAL HIGH (ref 6–23)
CALCIUM: 9.5 mg/dL (ref 8.4–10.5)
CO2: 29 mmol/L (ref 19–32)
Chloride: 106 mmol/L (ref 96–112)
Creatinine, Ser: 2.35 mg/dL — ABNORMAL HIGH (ref 0.50–1.35)
GFR calc Af Amer: 33 mL/min — ABNORMAL LOW (ref >90)
GFR calc non Af Amer: 29 mL/min — ABNORMAL LOW (ref >90)
Glucose, Bld: 60 mg/dL — ABNORMAL LOW (ref 70–99)
Potassium: 4.3 mmol/L (ref 3.5–5.1)
Sodium: 142 mmol/L (ref 135–145)

## 2015-01-01 LAB — GLUCOSE, CAPILLARY
GLUCOSE-CAPILLARY: 82 mg/dL (ref 70–99)
GLUCOSE-CAPILLARY: 109 mg/dL — AB (ref 70–99)
GLUCOSE-CAPILLARY: 157 mg/dL — AB (ref 70–99)
Glucose-Capillary: 50 mg/dL — ABNORMAL LOW (ref 70–99)
Glucose-Capillary: 120 mg/dL — ABNORMAL HIGH (ref 70–99)
Glucose-Capillary: 100 mg/dL — ABNORMAL HIGH (ref 70–99)

## 2015-01-01 LAB — HEMOGLOBIN A1C
Hgb A1c MFr Bld: 7.4 % — ABNORMAL HIGH (ref 4.8–5.6)
Mean Plasma Glucose: 166 mg/dL

## 2015-01-01 MED ORDER — INSULIN GLARGINE 100 UNIT/ML ~~LOC~~ SOLN
25.0000 [IU] | Freq: Once | SUBCUTANEOUS | Status: AC
  Administered 2015-01-01: 25 [IU] via SUBCUTANEOUS
  Filled 2015-01-01: qty 0.25

## 2015-01-01 MED ORDER — METOLAZONE 2.5 MG PO TABS
2.5000 mg | ORAL_TABLET | Freq: Once | ORAL | Status: AC
  Administered 2015-01-01: 2.5 mg via ORAL
  Filled 2015-01-01: qty 1

## 2015-01-01 MED ORDER — DARBEPOETIN ALFA 25 MCG/0.42ML IJ SOSY
25.0000 ug | PREFILLED_SYRINGE | Freq: Once | INTRAMUSCULAR | Status: AC
  Administered 2015-01-01: 25 ug via SUBCUTANEOUS
  Filled 2015-01-01: qty 0.42

## 2015-01-01 MED ORDER — INSULIN GLARGINE 100 UNIT/ML ~~LOC~~ SOLN
25.0000 [IU] | Freq: Two times a day (BID) | SUBCUTANEOUS | Status: DC
  Administered 2015-01-01 – 2015-01-05 (×8): 25 [IU] via SUBCUTANEOUS
  Filled 2015-01-01 (×9): qty 0.25

## 2015-01-01 NOTE — Progress Notes (Signed)
Hypoglycemic Event  CBG: 50  Treatment: 15 GM carbohydrate snack  Symptoms: Sweaty  Follow-up CBG: Time: 0437 CBG Result:82  Possible Reasons for Event: Medication regimen: Patient on 50 units lantus BID, per DM coordinator need to reduce to half of this dose  Comments/MD notified: Cardiology on call paged and notified that CBG low and DM coordinator's suggestion.     [name] A  Remember to initiate Hypoglycemia Order Set & complete

## 2015-01-01 NOTE — Progress Notes (Signed)
Received report from previous shift RN, pt currently sleeping, resting comfortably in bed in NAD. Will continue to monitor. Huel Coventry, RN

## 2015-01-01 NOTE — Progress Notes (Signed)
Subjective: No orthopnea, SOB  Objective: Vital signs in last 24 hours: Temp:  [97.2 F (36.2 C)-98.2 F (36.8 C)] 97.2 F (36.2 C) (03/30 0439) Pulse Rate:  [64-72] 72 (03/30 0439) Resp:  [20] 20 (03/29 2059) BP: (113-125)/(51-71) 125/63 mmHg (03/30 0439) SpO2:  [95 %-100 %] 98 % (03/30 0439) Weight:  [295 lb 12.8 oz (134.174 kg)] 295 lb 12.8 oz (134.174 kg) (03/30 0439) Last BM Date: 12/31/14  Intake/Output from previous day: 03/29 0701 - 03/30 0700 In: 1623 [P.O.:1620; I.V.:3] Out: 2200 [Urine:2200] Intake/Output this shift: Total I/O In: -  Out: 300 [Urine:300]  Medications Current Facility-Administered Medications  Medication Dose Route Frequency Provider Last Rate Last Dose  . 0.9 %  sodium chloride infusion  250 mL Intravenous PRN Azalee Course, PA      . acetaminophen (TYLENOL) tablet 650 mg  650 mg Oral Q4H PRN Azalee Course, PA      . apixaban (ELIQUIS) tablet 5 mg  5 mg Oral BID Azalee Course, PA   5 mg at 12/31/14 2206  . carvedilol (COREG) tablet 6.25 mg  6.25 mg Oral BID WC Azalee Course, PA   6.25 mg at 12/31/14 1933  . cholecalciferol (VITAMIN D) tablet 2,000 Units  2,000 Units Oral Daily Azalee Course, PA   2,000 Units at 12/31/14 1045  . furosemide (LASIX) injection 80 mg  80 mg Intravenous 3 times per day Azalee Course, PA   80 mg at 01/01/15 0507  . hydrALAZINE (APRESOLINE) tablet 10 mg  10 mg Oral 3 times per day Azalee Course, PA   10 mg at 01/01/15 0505  . insulin aspart (novoLOG) injection 0-15 Units  0-15 Units Subcutaneous TID WC Azalee Course, PA   2 Units at 12/31/14 1130  . insulin glargine (LANTUS) injection 50 Units  50 Units Subcutaneous BID Azalee Course, [place]   50 Units at 12/31/14 2207  . isosorbide mononitrate (IMDUR) 24 hr tablet 30 mg  30 mg Oral Daily Azalee Course, [place]   30 mg at 12/31/14 1045  . levothyroxine (SYNTHROID, LEVOTHROID) tablet 187.5 mcg  187.5 mcg Oral QAC breakfast Jake Bathe, MD   187.5 mcg at 01/01/15 0507  . mexiletine (MEXITIL) capsule 150 mg  150 mg Oral TID Azalee Course, PA   150 mg at 01/01/15 0007  . omega-3 acid ethyl esters (LOVAZA) capsule 1 g  1 g Oral TID Jake Bathe, MD   1 g at 01/01/15 0006  . ondansetron (ZOFRAN) injection 4 mg  4 mg Intravenous Q6H PRN Azalee Course, PA      . potassium chloride SA (K-DUR,KLOR-CON) CR tablet 40 mEq  40 mEq Oral TID Azalee Course, PA   40 mEq at 01/01/15 0006  . rosuvastatin (CRESTOR) tablet 10 mg  10 mg Oral Daily Azalee Course, [place]   10 mg at 12/31/14 1045  . sodium chloride 0.9 % injection 3 mL  3 mL Intravenous Q12H Azalee Course, PA   3 mL at 12/31/14 2207  . sodium chloride 0.9 % injection 3 mL  3 mL Intravenous PRN Azalee Course, PA      . tamsulosin (FLOMAX) capsule 0.4 mg  0.4 mg Oral Daily Azalee Course, PA   0.4 mg at 12/31/14 1045    PE: General appearance: alert, cooperative and no distress Neck: JVD elevated Lungs: clear to auscultation bilaterally Heart: regular rate and rhythm, S1, S2, S3  no murmur, click, rub  Abdomen: +BS, nontender, + distention Extremities: 1+ LEE Pulses:  2+ and symmetric Skin: Warm and dry Neurologic: Grossly normal  Lab Results:   Recent Labs  12/30/14 1155  WBC 5.7  HGB 10.7*  HCT 32.9*  PLT 76*   BMET  Recent Labs  12/30/14 1155 12/31/14 0444 01/01/15 0301  NA 140 140 142  K 3.6 4.1 4.3  CL 103 106 106  CO2 GLUCOSE 66* 73 60*  BUN 43* 41* 40*  CREATININE 2.41* 2.16* 2.35*  CALCIUM 9.3 9.2 9.5      Assessment/Plan  Active Problems:   Acute on chronic systolic heart failure 1. Acute on chronic systolic heart failure with baseline EF 25% - Breathing improved - Net I/O:  -0.6L/-0.4L.  PO intake was 1.6L. - Cr steady 2.41>>2.16>> 2.35 today - Continue Coreg 6.25mg  BID, Lasix  IV TID - Potasium 4.3 today. He is on K-Dur CR TID. Continue to monitor. - Metolazone 2.5mg  seems to have helped increase UOP  - Can consider torsemide on discharge vs lasix with PRN metolazone -  Limited PO intake to 1.2 L - Still appears volume overloaded.  JVD  elevated.  Will give another dose of metolazone prior to next lasix dose.   2. Elevated troponin due to demand ischemia, without recent CP - relatively flat troponin trend - In absence of angina, anticipate continued medical therapy as he is not a good candidate for re-cath given renal insufficiency  - Continue BB, statin  3. Acute on chronic IV CKD - Cr 2.35 - Continue to monitor  4. History of LV clot. -Continue Eliquis  5. HTN - Stable - Continue Coreg and Hydralazine  6. History of complete heart block s/p CRT-D in 2012 with generator change in 2014  7. History of VT s/p EPS/RFCA in 2012 currently on mexiletine 8. Hypothyroidism  Synthroid  9. NSVT  6 beats on tele.  On coreg.  Continue to monitor  10.  Anemia of chronic disease  Ordered week EPO dose. 11. DM/Hypoglycemia  Blood sugars running low.  Will decrease lantus to 25u BID.  Continue to monitor CBG.     LOS: 2 days    HAGER, BRYAN PA-C 01/01/2015 8:08 AM  Patient seen and examined. I agree with the assessment and plan as detailed above. See also my additional thoughts below.   Plan to continue diuresis. He may need a higher dose of metolazone.  Willa Rough, MD, [facility] 01/01/2015 11:41 AM

## 2015-01-01 NOTE — Progress Notes (Signed)
Pharmacist Heart Failure Core Measure Documentation  Assessment: [name] has an EF documented as 25% on 09/01/2014 by ECHO.  Rationale: Heart failure patients with left ventricular systolic dysfunction (LVSD) and an EF < 40% should be prescribed an angiotensin converting enzyme inhibitor (ACEI) or angiotensin receptor blocker (ARB) at discharge unless a contraindication is documented in the medical record.  This patient is not currently on an ACEI or ARB for HF.  This note is being placed in the record in order to provide documentation that a contraindication to the use of these agents is present for this encounter.  ACE Inhibitor or Angiotensin Receptor Blocker is contraindicated (specify all that apply)  []   ACEI allergy AND ARB allergy []   Angioedema []   Moderate or severe aortic stenosis []   Hyperkalemia []   Hypotension []   Renal artery stenosis [x]   Worsening renal function, preexisting renal disease or dysfunction   [name] 01/01/2015 10:20 AM

## 2015-01-01 NOTE — Progress Notes (Signed)
I cosign all documentation and medication administration for this shift by Komicia Jeffries, Student RN.  

## 2015-01-01 NOTE — Care Management Note (Unsigned)
    Page 1 of 1   01/02/2015     3:57:57 PM CARE MANAGEMENT NOTE 01/02/2015  Patient:  [name], [name]   Account Number:  [id number]  Date Initiated:  01/01/2015  Documentation initiated by:  Gaelen Brager  Subjective/Objective Assessment:   Pt adm on 01/01/15 with CHF exacerbation.  PTA, pt resided at home with spouse and is independent.     Action/Plan:   Will follow for dc needs as pt progresses.   Anticipated DC Date:  01/04/2015   Anticipated DC Plan:  HOME/SELF CARE      DC Planning Services  CM consult      Choice offered to / List presented to:             Status of service:  In process, will continue to follow Medicare Important Message given?  YES (If response is "NO", the following Medicare IM given date fields will be blank) Date Medicare IM given:  01/02/2015 Medicare IM given by:  Tashari Schoenfelder Date Additional Medicare IM given:   Additional Medicare IM given by:    Discharge Disposition:    Per UR Regulation:  Reviewed for med. necessity/level of care/duration of stay  If discussed at Long Length of Stay Meetings, dates discussed:    Comments:

## 2015-01-02 LAB — BASIC METABOLIC PANEL
ANION GAP: 10 (ref 5–15)
BUN: 42 mg/dL — ABNORMAL HIGH (ref 6–23)
CO2: 25 mmol/L (ref 19–32)
CREATININE: 2.31 mg/dL — AB (ref 0.50–1.35)
Calcium: 9.2 mg/dL (ref 8.4–10.5)
Chloride: 106 mmol/L (ref 96–112)
GFR calc Af Amer: 34 mL/min — ABNORMAL LOW (ref >90)
GFR calc non Af Amer: 29 mL/min — ABNORMAL LOW (ref >90)
GLUCOSE: 98 mg/dL (ref 70–99)
Potassium: 4.4 mmol/L (ref 3.5–5.1)
Sodium: 141 mmol/L (ref 135–145)

## 2015-01-02 LAB — GLUCOSE, CAPILLARY
GLUCOSE-CAPILLARY: 83 mg/dL (ref 70–99)
GLUCOSE-CAPILLARY: 131 mg/dL — AB (ref 70–99)
Glucose-Capillary: 89 mg/dL (ref 70–99)
Glucose-Capillary: 160 mg/dL — ABNORMAL HIGH (ref 70–99)

## 2015-01-02 NOTE — Progress Notes (Signed)
Patient alert oriented, denies pain, . Patient ambulate in hall way by self without shortness of breath. Will continue to monitor patient.

## 2015-01-02 NOTE — Progress Notes (Signed)
Patient Name: [name] Date of Encounter: 01/02/2015  Primary Cardiologist: Dr. Antoine Poche   Active Problems:   Acute on chronic systolic heart failure    SUBJECTIVE  Denies any SOB or CP.   CURRENT MEDS . apixaban  5 mg Oral BID  . carvedilol  6.25 mg Oral BID WC  . cholecalciferol  2,000 Units Oral Daily  . furosemide  80 mg Intravenous 3 times per day  . hydrALAZINE  10 mg Oral 3 times per day  . insulin aspart  0-15 Units Subcutaneous TID WC  . insulin glargine  25 Units Subcutaneous BID  . isosorbide mononitrate  30 mg Oral Daily  . levothyroxine  187.5 mcg Oral QAC breakfast  . mexiletine  150 mg Oral TID  . omega-3 acid ethyl esters  1 g Oral TID  . potassium chloride  40 mEq Oral TID  . rosuvastatin  10 mg Oral Daily  . sodium chloride  3 mL Intravenous Q12H  . tamsulosin  0.4 mg Oral Daily    OBJECTIVE  Filed Vitals:   01/01/15 1700 01/01/15 2110 01/02/15 0431 01/02/15 0538  BP: 104/58 125/69 115/63 117/63  Pulse: 60 72 63   Temp:  97.8 F (36.6 C) 97.6 F (36.4 C)   TempSrc:  Oral Oral   Resp:  18 18   Height:      Weight:   295 lb 11.2 oz (134.129 kg)   SpO2:  95% 98%     Intake/Output Summary (Last 24 hours) at 01/02/15 0749 Last data filed at 01/02/15 0436  Gross per 24 hour  Intake   1180 ml  Output   2276 ml  Net  -1096 ml   Filed Weights   12/31/14 0539 01/01/15 0439 01/02/15 0431  Weight: 299 lb 3.2 oz (135.716 kg) 295 lb 12.8 oz (134.174 kg) 295 lb 11.2 oz (134.129 kg)    PHYSICAL EXAM  General: Pleasant, NAD. Neuro: Alert and oriented X 3. Moves all extremities spontaneously. Psych: Normal affect. HEENT:  Normal  Neck: Supple without bruits +mild JVD. Lungs:  Resp regular and unlabored, CTA. Heart: RRR no s3, s4, or murmurs. Abdomen: Soft, non-tender, non-distended, BS + x 4.  Extremities: No clubbing, cyanosis. DP/PT/Radials 2+ and equal bilaterally. 1-2+ pitting edema  Accessory Clinical Findings  CBC  Recent  Labs  12/30/14 1155  WBC 5.7  NEUTROABS 4.0  HGB 10.7*  HCT 32.9*  MCV 87.7  PLT 76*   Basic Metabolic Panel  Recent Labs  01/01/15 0301 01/02/15 0427  NA 142 141  K 4.3 4.4  CL 106 106  CO2 29 25  GLUCOSE 60* 98  BUN 40* 42*  CREATININE 2.35* 2.31*  CALCIUM 9.5 9.2   Cardiac Enzymes  Recent Labs  12/30/14 1528 12/30/14 2106 12/31/14 0444  TROPONINI 0.48* 0.45* 0.48*   Hemoglobin A1C  Recent Labs  12/30/14 1911  HGBA1C 7.4*    TELE AV paced rhythm    ECG  No new EKG  Echocardiogram 09/01/2014  LV EF: 25%  ------------------------------------------------------------------- Indications:   CHF - 428.0.  ------------------------------------------------------------------- Study Conclusions  - Left ventricle: There is 1.5cm x 1.5cm clot in the apex. By report, this clot has been seen in the past. The cavity size was moderately to severely dilated. Wall thickness was increased in a pattern of moderate to severe LVH. The estimated ejection fraction was 25%. Diffuse hypokinesis. Doppler parameters are consistent with high ventricular filling pressure. - Left atrium: The atrium was moderately  dilated. - Right ventricle: There is some RV dysfunction. Not able to assess RV function due to poor visualization. Not able to assess RV size due to poor visualization.    Radiology/Studies  Dg Chest 2 View  12/30/2014   CLINICAL DATA:  Retained fluid, swollen belly and feet.  EXAM: CHEST  2 VIEW  COMPARISON:  10/11/2014  FINDINGS: Bilateral interstitial thickening with prominence of the central pulmonary vasculature. Trace bilateral pleural effusions. No pneumothorax. Stable cardiomegaly. There is a cardiac pacemaker present.  Osseous structures are unremarkable.  IMPRESSION: Cardiomegaly with pulmonary vascular congestion.   Electronically Signed   By: Elige Ko   On: 12/30/2014 11:59    ASSESSMENT AND PLAN  1. Acute on chronic systolic  heart failure with baseline EF 25% - Breathing improved - Net I/O: -1.8L.  - Continue Coreg 6.25mg  BID, Lasix  IV TID - Potasium 4.4 today. He is on K-Dur CR TID. Continue to monitor. - Metolazone 2.5mg  seems to have helped increase UOP  - Can consider torsemide on discharge vs lasix with PRN metolazone -Limited PO intake to 1.2 L - Still has 2+ LE pitting edema and mild JVD elevation, however lung is clear, weight back to previous discharge weight. Can either continue IV lasix one more day vs switch to PO lasix (note patient was previously compliant with medication, fluid/Na restriction), can consider home dose of  BID PO lasix + one/twice weekly metolazone.  2. Elevated troponin due to demand ischemia, without recent CP - relatively flat troponin trend - In absence of angina, anticipate continued medical therapy as he is not a good candidate for re-cath given renal insufficiency  - Continue BB, statin  3. Acute on chronic IV CKD - Cr 2.31 - Continue to monitor  4. History of LV clot. -Continue Eliquis  5. HTN - Stable - Continue Coreg and Hydralazine  6. History of complete heart block s/p CRT-D in 2012 with generator change in 2014  7. History of VT s/p EPS/RFCA in 2012 currently on mexiletine  8. Hypothyroidism Synthroid   9. NSVT 6 beats on tele. On coreg. Continue to monitor  10. Anemia of chronic disease Ordered week EPO dose.  11. DM/Hypoglycemia Blood sugars running low. Will decrease lantus to 25u BID. Continue to monitor CBG.   Ramond Dial PA-C Pager: [phone]   History and all data above reviewed.  Patient examined.  I agree with the findings as above. He reports that he is breathing much better  The patient exam reveals COR:RRR  ,  Lungs: Clear  ,  Abd: Distended, Ext Severe edema, Neck:  JVD to jaw at 30 degrees  .  All available labs, radiology testing, previous records  reviewed. Agree with documented assessment and plan. Acute on chronic CHF.  I think he will need several days of IV diuresis.  He is clearly not at baseline.   Rollene Rotunda  9:47 AM  01/02/2015

## 2015-01-03 LAB — GLUCOSE, CAPILLARY
GLUCOSE-CAPILLARY: 116 mg/dL — AB (ref 70–99)
Glucose-Capillary: 164 mg/dL — ABNORMAL HIGH (ref 70–99)
Glucose-Capillary: 227 mg/dL — ABNORMAL HIGH (ref 70–99)
Glucose-Capillary: 169 mg/dL — ABNORMAL HIGH (ref 70–99)

## 2015-01-03 MED ORDER — METOLAZONE 2.5 MG PO TABS
2.5000 mg | ORAL_TABLET | Freq: Once | ORAL | Status: AC
  Administered 2015-01-03: 2.5 mg via ORAL
  Filled 2015-01-03: qty 1

## 2015-01-03 NOTE — Progress Notes (Signed)
    SUBJECTIVE:  Breathing better than on admisison   PHYSICAL EXAM Filed Vitals:   01/02/15 1424 01/02/15 2121 01/03/15 0554 01/03/15 0622  BP: 123/59 110/60 115/50   Pulse: 65 62 79   Temp: 97.8 F (36.6 C) 97.9 F (36.6 C) 98.2 F (36.8 C)   TempSrc: Oral Oral Oral   Resp: 26 24 22    Height:      Weight:    291 lb 3.2 oz (132.087 kg)  SpO2: 97% 96% 95%    General:  No distress Lungs:  Clear Heart:  RRR Abdomen:  Positive bowel sounds, no rebound no guarding Extremities:  Severe edema still.  LABS:  Results for orders placed or performed during the hospital encounter of 12/30/14 (from the past 24 hour(s))  Glucose, capillary     Status: None   Collection Time: 01/02/15 12:14 PM  Result Value Ref Range   Glucose-Capillary 89 70 - 99 mg/dL  Glucose, capillary     Status: Abnormal   Collection Time: 01/02/15  4:02 PM  Result Value Ref Range   Glucose-Capillary 131 (H) 70 - 99 mg/dL  Glucose, capillary     Status: Abnormal   Collection Time: 01/02/15  9:16 PM  Result Value Ref Range   Glucose-Capillary 160 (H) 70 - 99 mg/dL  Glucose, capillary     Status: Abnormal   Collection Time: 01/03/15  5:52 AM  Result Value Ref Range   Glucose-Capillary 116 (H) 70 - 99 mg/dL    Intake/Output Summary (Last 24 hours) at 01/03/15 0832 Last data filed at 01/03/15 8721  Gross per 24 hour  Intake   1320 ml  Output   2825 ml  Net  -1505 ml     ASSESSMENT AND PLAN:  Acute on chronic systolic heart failure:    Good urine output.    Elevated troponin:  Demand ischemia.  No plan for ischemia work up at this point.   CKD:  Check BMET in AM.     History of VT:  No arrhythmias in several days.  OK to discontinue telemetry.    DM:  Insulin decreased and blood sugars stable.   Fayrene Fearing [facility] 01/03/2015 8:32 AM

## 2015-01-03 NOTE — Progress Notes (Signed)
I cosign all medication administration and documentation this shift by Komicia Jeffries student RN  

## 2015-01-04 DIAGNOSIS — N184 Chronic kidney disease, stage 4 (severe): Secondary | ICD-10-CM

## 2015-01-04 DIAGNOSIS — I2109 ST elevation (STEMI) myocardial infarction involving other coronary artery of anterior wall: Secondary | ICD-10-CM

## 2015-01-04 DIAGNOSIS — I442 Atrioventricular block, complete: Secondary | ICD-10-CM

## 2015-01-04 DIAGNOSIS — I248 Other forms of acute ischemic heart disease: Secondary | ICD-10-CM

## 2015-01-04 DIAGNOSIS — Z95810 Presence of automatic (implantable) cardiac defibrillator: Secondary | ICD-10-CM

## 2015-01-04 DIAGNOSIS — I472 Ventricular tachycardia: Secondary | ICD-10-CM

## 2015-01-04 DIAGNOSIS — I429 Cardiomyopathy, unspecified: Secondary | ICD-10-CM

## 2015-01-04 DIAGNOSIS — I519 Heart disease, unspecified: Secondary | ICD-10-CM

## 2015-01-04 DIAGNOSIS — I10 Essential (primary) hypertension: Secondary | ICD-10-CM

## 2015-01-04 LAB — GLUCOSE, CAPILLARY
GLUCOSE-CAPILLARY: 130 mg/dL — AB (ref 70–99)
GLUCOSE-CAPILLARY: 136 mg/dL — AB (ref 70–99)
GLUCOSE-CAPILLARY: 197 mg/dL — AB (ref 70–99)
GLUCOSE-CAPILLARY: 243 mg/dL — AB (ref 70–99)

## 2015-01-04 LAB — BASIC METABOLIC PANEL
Anion gap: 7 (ref 5–15)
BUN: 44 mg/dL — ABNORMAL HIGH (ref 6–23)
CO2: 29 mmol/L (ref 19–32)
Calcium: 9.1 mg/dL (ref 8.4–10.5)
Chloride: 105 mmol/L (ref 96–112)
Creatinine, Ser: 2.36 mg/dL — ABNORMAL HIGH (ref 0.50–1.35)
GFR calc non Af Amer: 28 mL/min — ABNORMAL LOW (ref >90)
GFR, EST AFRICAN AMERICAN: 33 mL/min — AB (ref >90)
Glucose, Bld: 146 mg/dL — ABNORMAL HIGH (ref 70–99)
Potassium: 4.5 mmol/L (ref 3.5–5.1)
Sodium: 141 mmol/L (ref 135–145)

## 2015-01-04 MED ORDER — FUROSEMIDE 80 MG PO TABS
80.0000 mg | ORAL_TABLET | Freq: Every evening | ORAL | Status: DC
  Administered 2015-01-04: 80 mg via ORAL
  Filled 2015-01-04 (×2): qty 1

## 2015-01-04 MED ORDER — FUROSEMIDE 80 MG PO TABS
120.0000 mg | ORAL_TABLET | Freq: Every morning | ORAL | Status: DC
  Administered 2015-01-04 – 2015-01-05 (×2): 120 mg via ORAL
  Filled 2015-01-04 (×2): qty 1

## 2015-01-04 NOTE — Progress Notes (Signed)
SUBJECTIVE: Denies chest pain. Says breathing has improved considerably, as has leg swelling. Weight down to 289 lbs.     Intake/Output Summary (Last 24 hours) at 01/04/15 0947 Last data filed at 01/04/15 0848  Gross per 24 hour  Intake   1080 ml  Output   2025 ml  Net   -945 ml    Current Facility-Administered Medications  Medication Dose Route Frequency Provider Last Rate Last Dose  . 0.9 %  sodium chloride infusion  250 mL Intravenous PRN Azalee Course, PA      . acetaminophen (TYLENOL) tablet 650 mg  650 mg Oral Q4H PRN Azalee Course, PA      . apixaban (ELIQUIS) tablet 5 mg  5 mg Oral BID Azalee Course, PA   5 mg at 01/03/15 2125  . carvedilol (COREG) tablet 6.25 mg  6.25 mg Oral BID WC Azalee Course, PA   6.25 mg at 01/03/15 1700  . cholecalciferol (VITAMIN D) tablet 2,000 Units  2,000 Units Oral Daily Azalee Course, PA   2,000 Units at 01/03/15 1045  . furosemide (LASIX) injection 80 mg  80 mg Intravenous 3 times per day Azalee Course, PA   80 mg at 01/04/15 0532  . hydrALAZINE (APRESOLINE) tablet 10 mg  10 mg Oral 3 times per day Azalee Course, PA   10 mg at 01/04/15 0531  . insulin aspart (novoLOG) injection 0-15 Units  0-15 Units Subcutaneous TID WC Azalee Course, PA   2 Units at 01/04/15 [phone]  . insulin glargine (LANTUS) injection 25 Units  25 Units Subcutaneous BID Dwana Melena, PA-C   25 Units at 01/03/15 2207  . isosorbide mononitrate (IMDUR) 24 hr tablet 30 mg  30 mg Oral Daily Azalee Course, [place]   30 mg at 01/03/15 1044  . levothyroxine (SYNTHROID, LEVOTHROID) tablet 187.5 mcg  187.5 mcg Oral QAC breakfast Jake Bathe, MD   187.5 mcg at 01/04/15 0532  . mexiletine (MEXITIL) capsule 150 mg  150 mg Oral TID Azalee Course, PA   150 mg at 01/03/15 2125  . omega-3 acid ethyl esters (LOVAZA) capsule 1 g  1 g Oral TID Jake Bathe, MD   1 g at 01/03/15 2125  . ondansetron (ZOFRAN) injection 4 mg  4 mg Intravenous Q6H PRN Azalee Course, PA      . potassium chloride SA (K-DUR,KLOR-CON) CR tablet 40 mEq  40 mEq Oral TID  Azalee Course, PA   40 mEq at 01/03/15 2125  . rosuvastatin (CRESTOR) tablet 10 mg  10 mg Oral Daily Azalee Course, [place]   10 mg at 01/03/15 1044  . sodium chloride 0.9 % injection 3 mL  3 mL Intravenous Q12H Azalee Course, PA   3 mL at 01/03/15 2128  . sodium chloride 0.9 % injection 3 mL  3 mL Intravenous PRN Azalee Course, PA      . tamsulosin (FLOMAX) capsule 0.4 mg  0.4 mg Oral Daily Azalee Course, [place]   0.4 mg at 01/03/15 1044    Filed Vitals:   01/03/15 1045 01/03/15 1354 01/03/15 2140 01/04/15 0542  BP: 114/61 118/62 103/56 112/50  Pulse: 82 62 63 72  Temp: 98.6 F (37 C) 97.8 F (36.6 C) 98 F (36.7 C) 98.9 F (37.2 C)  TempSrc: Oral Oral Oral Oral  Resp: Height:      Weight:    289 lb 11.2 oz (131.407 kg)  SpO2: 97% 98% 93% 94%  PHYSICAL EXAM General: NAD HEENT: Normal. Neck: No JVD, no thyromegaly.  Lungs: Clear to auscultation bilaterally with normal respiratory effort. CV: Nondisplaced PMI.  Regular rate and rhythm, normal S1/S2, no S3/S4, 2/6 apical holosystolic murmur.  1+ pitting pretibial edema.    Abdomen: Soft, nontender, obese.  Neurologic: Alert and oriented x 3.  Psych: Normal affect. Musculoskeletal: No gross deformities. Extremities: No clubbing or cyanosis.   TELEMETRY: Not being monitored at present.  LABS: Basic Metabolic Panel:  Recent Labs  26/41/58 0427 01/04/15 0335  NA 141 141  K 4.4 4.5  CL 106 105  CO2 25 29  GLUCOSE 98 146*  BUN 42* 44*  CREATININE 2.31* 2.36*  CALCIUM 9.2 9.1   Liver Function Tests: No results for input(s): AST, ALT, ALKPHOS, BILITOT, PROT, ALBUMIN in the last 72 hours. No results for input(s): LIPASE, AMYLASE in the last 72 hours. CBC: No results for input(s): WBC, NEUTROABS, HGB, HCT, MCV, PLT in the last 72 hours. Cardiac Enzymes: No results for input(s): CKTOTAL, CKMB, CKMBINDEX, TROPONINI in the last 72 hours. BNP: Invalid input(s): POCBNP D-Dimer: No results for input(s): DDIMER in the last 72  hours. Hemoglobin A1C: No results for input(s): HGBA1C in the last 72 hours. Fasting Lipid Panel: No results for input(s): CHOL, HDL, LDLCALC, TRIG, CHOLHDL, LDLDIRECT in the last 72 hours. Thyroid Function Tests: No results for input(s): TSH, T4TOTAL, T3FREE, THYROIDAB in the last 72 hours.  Invalid input(s): FREET3 Anemia Panel: No results for input(s): VITAMINB12, FOLATE, FERRITIN, TIBC, IRON, RETICCTPCT in the last 72 hours.  RADIOLOGY: Dg Chest 2 View  12/30/2014   CLINICAL DATA:  Retained fluid, swollen belly and feet.  EXAM: CHEST  2 VIEW  COMPARISON:  10/11/2014  FINDINGS: Bilateral interstitial thickening with prominence of the central pulmonary vasculature. Trace bilateral pleural effusions. No pneumothorax. Stable cardiomegaly. There is a cardiac pacemaker present.  Osseous structures are unremarkable.  IMPRESSION: Cardiomegaly with pulmonary vascular congestion.   Electronically Signed   By: Elige Ko   On: 12/30/2014 11:59      ASSESSMENT AND PLAN: 1. Acute on chronic systolic heart failure, EF 25%: Approaching baseline with regards to lower extremity edema. Has had another 1 L output in last 24 hours. Wt appears to be at baseline, now 289 lbs. Will switch to oral diuretics today as per home dosing, 120 mg q am and 80 mg q pm. Creatinine 2.36 today, 2.31 yesterday. BUN 44 today, 42 yesterday. Continue Coreg, hydralazine, and Imdur.  2. Apical thrombus: On Eliquis.  3. Elevated troponin: Consistent with demand ischemia.  4. CKD stage IV: Creatinine 2.36 today, 2.31 yesterday. BUN 44 today, 42 yesterday.  5. Essential HTN: Well controlled. No changes.  6. History of ventricular tachycardia s/p EPS/RFCA in 2012: On mexiletine. Stable.  7. History of complete heart block s/p CRT-D in 2012 with generator change in 2014: Stable.  Dispo: possible discharge on 4/3.   Prentice Docker, M.D., F.A.C.C.

## 2015-01-05 LAB — GLUCOSE, CAPILLARY
GLUCOSE-CAPILLARY: 136 mg/dL — AB (ref 70–99)
Glucose-Capillary: 154 mg/dL — ABNORMAL HIGH (ref 70–99)

## 2015-01-05 MED ORDER — POTASSIUM CHLORIDE CRYS ER 20 MEQ PO TBCR: 40.0000 meq | EXTENDED_RELEASE_TABLET | Freq: Three times a day (TID) | ORAL | Status: AC

## 2015-01-05 MED ORDER — FUROSEMIDE 40 MG PO TABS: 120.0000 mg | ORAL_TABLET | Freq: Two times a day (BID) | ORAL | Status: AC

## 2015-01-05 NOTE — Discharge Summary (Signed)
Discharge Summary   Patient ID: [name],  MRN: [id number], DOB/AGE: 02/22/1956 59 y.o.  Admit date: 12/30/2014 Discharge date: 01/05/2015  Primary Care Provider: Rudi Holloway Primary Cardiologist: Dr. Antoine Holloway  Discharge Diagnoses Principal Problem:   Acute on chronic systolic heart failure Active Problems:   Type 2 diabetes mellitus treated with insulin   Hyperlipidemia   Obesity   HTN (hypertension)   Hypertrophic obstructive cardiomyopathy   AV BLOCK, COMPLETE   CKD (chronic kidney disease), stage IV   ICD (implantable cardioverter-defibrillator) in place   Allergies Allergies  Allergen Reactions  . Ace Inhibitors     AVOID ALL  BECAUSE OF HEART DISEASE   . Amiodarone     Abnormal LFTs (10/2014)  . Nitroglycerin Other (See Comments)    Reaction unknown  . Procan Sr [Procainamide Hcl]     PT. HAS HEART DISEASE CALLED HYPERTHROPHIC CARDIOMYOPATHY  . Quinidine     PT. HAS HEART DISEASE CALLED HYPERTHROPHIC CARDIOMYOPATHY      Hospital Course  [name] is a 59 year old Caucasian male with PMH of nonobstructive CAD by cath 2012, stage IV CKD, history of chronic systolic heart failure with baseline EF 25%, HTN, DM, history of complete heart block s/p CRT-D in 2012 with generator change in 2014, history of VT s/p EPS/RFCA in 2012 currently on mexiletine, history of EtOH abuse, and a history of hypothyroidism. His last cardiac catheterization in 2012 revealed nonobstructive CAD, it was felt to be mainly nonischemic cardiomyopathy. He had CRT-D placed in the same year for complete heart block with a generator change in 2014. He has been shocked previously, however has not been shocked in the last year.  He presented to [facility] on 12/22/2014 for shortness of breath and weight gain. On arrival his blood pressure was 100/62, chest x-ray revealed pulmonary vascular congestion. Troponin was also mildly elevated which was felt to be due to demand ischemia in the  setting of heart failure. He was admitted to cardiology service for IV diuresis. Metolazone was also added to his medical regimen to help with diuresis.   He was seen in the morning of 01/05/2015, at which time his shortness of breath has improved. He will be discharged on 120 mg twice a day of by mouth Lasix. Emphasis has been placed on compliance with low sodium diet, fluid restriction. He will need a basic metabolic panel on Wednesday 01/08/2015 and have results sent to Dr. Antoine Holloway. His admission weight this time was 301 pounds, his discharge weight is 289 pounds which appears to be his baseline weight.  Significant medication change during this hospitalization include increase of Lasix to 120 mg twice a day and potassium chloride 40 mg 3 times daily.   Discharge Vitals Blood pressure 113/71, pulse 74, temperature 97.4 F (36.3 C), temperature source Oral, resp. rate 18, height 6' 0.99" (1.854 m), weight 289 lb (131.09 kg), SpO2 95 %.  Filed Weights   01/03/15 0622 01/04/15 0542 01/05/15 0441  Weight: 291 lb 3.2 oz (132.087 kg) 289 lb 11.2 oz (131.407 kg) 289 lb (131.09 kg)    Labs  Basic Metabolic Panel  Recent Labs  01/04/15 0335  NA 141  K 4.5  CL 105  CO2 29  GLUCOSE 146*  BUN 44*  CREATININE 2.36*  CALCIUM 9.1    Disposition  Pt is being discharged home today in good condition.  Follow-up Plans & Appointments    Discharge Medications    Medication List    ASK your  doctor about these medications        apixaban 5 MG Tabs tablet  Commonly known as:  ELIQUIS  Take 1 tablet (5 mg total) by mouth 2 (two) times daily.     carvedilol 6.25 MG tablet  Commonly known as:  COREG  Take 1 tablet (6.25 mg total) by mouth 2 (two) times daily with a meal.     cholecalciferol 1000 UNITS tablet  Commonly known as:  VITAMIN D  Take 2,000 Units by mouth daily.     fish oil-omega-3 fatty acids 1000 MG capsule  Take 1 capsule by mouth 3 (three) times daily.     furosemide  40 MG tablet  Commonly known as:  LASIX  Take 3 tablets (120 mg total) by mouth 2 (two) times daily.     hydrALAZINE 10 MG tablet  Commonly known as:  APRESOLINE  Take 1 tablet (10 mg total) by mouth every 8 (eight) hours.     insulin glargine 100 UNIT/ML injection  Commonly known as:  LANTUS  Inject 50 Units into the skin 2 (two) times daily.     insulin lispro 100 UNIT/ML KiwkPen  Commonly known as:  HUMALOG KWIKPEN  Inject 16 units with largest/evening meal.Dispense quikpens     isosorbide mononitrate 30 MG 24 hr tablet  Commonly known as:  IMDUR  Take 1 tablet (30 mg total) by mouth daily.     levothyroxine 150 MCG tablet  Commonly known as:  SYNTHROID, LEVOTHROID  Take 150 mcg by mouth daily before breakfast. To be taken with 1/2 of a tab (37.60mcg) to equal 187.     levothyroxine 75 MCG tablet  Commonly known as:  SYNTHROID, LEVOTHROID  Take 37.5 mcg by mouth daily before breakfast. To be taken with levo to equal 187.     linagliptin 5 MG Tabs tablet  Commonly known as:  TRADJENTA  Take 1 tablet (5 mg total) by mouth daily.     mexiletine 150 MG capsule  Commonly known as:  MEXITIL  TAKE 1 CAPSULE EVERY 8 HOURS     rosuvastatin 20 MG tablet  Commonly known as:  CRESTOR  Take 0.5 tablets (10 mg total) by mouth daily.     tamsulosin 0.4 MG Caps capsule  Commonly known as:  FLOMAX  Take 1 capsule (0.4 mg total) by mouth daily.        Outstanding Labs/Studies  Obtain BMET lab on Wednesday 4/6 and send result to Dr. Jenene Holloway office  Duration of Discharge Encounter   Greater than 30 minutes including physician time.  Dakota Dial PA-C Pager: [phone] 01/05/2015, 11:45 AM

## 2015-01-05 NOTE — Discharge Instructions (Signed)

## 2015-01-05 NOTE — Progress Notes (Addendum)
SUBJECTIVE: Denies chest pain. Denies dyspnea    Intake/Output Summary (Last 24 hours) at 01/05/15 0940 Last data filed at 01/05/15 0851  Gross per 24 hour  Intake    900 ml  Output    800 ml  Net    100 ml    Current Facility-Administered Medications  Medication Dose Route Frequency Provider Last Rate Last Dose  . 0.9 %  sodium chloride infusion  250 mL Intravenous PRN Azalee Course, PA      . acetaminophen (TYLENOL) tablet 650 mg  650 mg Oral Q4H PRN Azalee Course, PA      . apixaban (ELIQUIS) tablet 5 mg  5 mg Oral BID Azalee Course, PA   5 mg at 01/04/15 2136  . carvedilol (COREG) tablet 6.25 mg  6.25 mg Oral BID WC Azalee Course, PA   6.25 mg at 01/04/15 1700  . cholecalciferol (VITAMIN D) tablet 2,000 Units  2,000 Units Oral Daily Azalee Course, PA   2,000 Units at 01/04/15 1042  . furosemide (LASIX) tablet 120 mg  120 mg Oral q morning - 10a Laqueta Linden, MD   120 mg at 01/04/15 1000  . furosemide (LASIX) tablet 80 mg  80 mg Oral QPM Laqueta Linden, MD   80 mg at 01/04/15 1931  . hydrALAZINE (APRESOLINE) tablet 10 mg  10 mg Oral 3 times per day Azalee Course, PA   10 mg at 01/05/15 0541  . insulin aspart (novoLOG) injection 0-15 Units  0-15 Units Subcutaneous TID WC Azalee Course, PA   2 Units at 01/05/15 [id number]  . insulin glargine (LANTUS) injection 25 Units  25 Units Subcutaneous BID Dwana Melena, PA-C   25 Units at 01/04/15 2136  . isosorbide mononitrate (IMDUR) 24 hr tablet 30 mg  30 mg Oral Daily Azalee Course, [place]   30 mg at 01/04/15 1042  . levothyroxine (SYNTHROID, LEVOTHROID) tablet 187.5 mcg  187.5 mcg Oral QAC breakfast Jake Bathe, MD   187.5 mcg at 01/05/15 0541  . mexiletine (MEXITIL) capsule 150 mg  150 mg Oral TID Azalee Course, PA   150 mg at 01/04/15 2136  . omega-3 acid ethyl esters (LOVAZA) capsule 1 g  1 g Oral TID Jake Bathe, MD   1 g at 01/04/15 2136  . ondansetron (ZOFRAN) injection 4 mg  4 mg Intravenous Q6H PRN Azalee Course, PA      . potassium chloride SA (K-DUR,KLOR-CON) CR  tablet 40 mEq  40 mEq Oral TID Azalee Course, PA   40 mEq at 01/04/15 2136  . rosuvastatin (CRESTOR) tablet 10 mg  10 mg Oral Daily Azalee Course, [place]   10 mg at 01/04/15 1042  . sodium chloride 0.9 % injection 3 mL  3 mL Intravenous Q12H Azalee Course, PA   3 mL at 01/04/15 2138  . sodium chloride 0.9 % injection 3 mL  3 mL Intravenous PRN Azalee Course, PA      . tamsulosin (FLOMAX) capsule 0.4 mg  0.4 mg Oral Daily Azalee Course, [place]   0.4 mg at 01/04/15 1042    Filed Vitals:   01/04/15 0542 01/04/15 1352 01/04/15 2008 01/05/15 0441  BP: 112/50 112/58 95/45 113/71  Pulse: 72 73 59 74  Temp: 98.9 F (37.2 C) 97.3 F (36.3 C) 98.5 F (36.9 C) 97.4 F (36.3 C)  TempSrc: Oral Oral Oral Oral  Resp: 18 20 18 18   Height:      Weight: 289 lb  11.2 oz (131.407 kg)   289 lb (131.09 kg)  SpO2: 94% 94% 95% 95%    PHYSICAL EXAM General: NAD HEENT: Normal. Neck: supple Lungs: CTA CV: RRR 2/6 apical holosystolic murmur. Abdomen: Soft, nontender, obese.  Neurologic: Grossly intact Extremities: Trace edema   LABS: Basic Metabolic Panel:  Recent Labs  91/47/82 0335  NA 141  K 4.5  CL 105  CO2 29  GLUCOSE 146*  BUN 44*  CREATININE 2.36*  CALCIUM 9.1   RADIOLOGY: Dg Chest 2 View  12/30/2014   CLINICAL DATA:  Retained fluid, swollen belly and feet.  EXAM: CHEST  2 VIEW  COMPARISON:  10/11/2014  FINDINGS: Bilateral interstitial thickening with prominence of the central pulmonary vasculature. Trace bilateral pleural effusions. No pneumothorax. Stable cardiomegaly. There is a cardiac pacemaker present.  Osseous structures are unremarkable.  IMPRESSION: Cardiomegaly with pulmonary vascular congestion.   Electronically Signed   By: Elige Ko   On: 12/30/2014 11:59      ASSESSMENT AND PLAN: 1. Acute on chronic systolic heart failure, EF 25%: Volume status improved; DC on lasix 120 BID; low Na diet. Check BMET Wed with results to Dr Antoine Poche. Continue Coreg, hydralazine, and Imdur.  2. Apical thrombus: On  Eliquis.  3. Elevated troponin: Consistent with demand ischemia. No further WU.  4. CKD stage IV: Follow renal function closely following DC  5. Essential HTN: Well controlled.   6. History of ventricular tachycardia s/p EPS/RFCA in 2012: On mexiletine.   7. History of complete heart block s/p CRT-D in 2012 with generator change in 2014: Stable.  DC home today and FU with Dr Antoine Poche in 2-4 weeks. > 30 min PA and physician time D2   Olga Millers, M.D., F.A.C.C.

## 2015-01-08 ENCOUNTER — Other Ambulatory Visit (INDEPENDENT_AMBULATORY_CARE_PROVIDER_SITE_OTHER): Payer: BLUE CROSS/BLUE SHIELD

## 2015-01-08 DIAGNOSIS — R799 Abnormal finding of blood chemistry, unspecified: Secondary | ICD-10-CM | POA: Diagnosis not present

## 2015-01-08 NOTE — Telephone Encounter (Signed)
done

## 2015-01-08 NOTE — Progress Notes (Signed)
LAB ONLY DR [place]

## 2015-01-09 LAB — BMP8+EGFR
BUN / CREAT RATIO: 20 (ref 9–20)
BUN: 55 mg/dL — ABNORMAL HIGH (ref 6–24)
CO2: 25 mmol/L (ref 18–29)
CREATININE: 2.74 mg/dL — AB (ref 0.76–1.27)
Calcium: 9 mg/dL (ref 8.7–10.2)
Chloride: 98 mmol/L (ref 97–108)
GFR calc Af Amer: 28 mL/min/{1.73_m2} — ABNORMAL LOW (ref >59)
GFR calc non Af Amer: 24 mL/min/{1.73_m2} — ABNORMAL LOW (ref >59)
Glucose: 111 mg/dL — ABNORMAL HIGH (ref 65–99)
Potassium: 5.1 mmol/L (ref 3.5–5.2)
SODIUM: 139 mmol/L (ref 134–144)

## 2015-01-10 ENCOUNTER — Telehealth: Payer: Self-pay | Admitting: Cardiology

## 2015-01-10 ENCOUNTER — Telehealth (HOSPITAL_COMMUNITY): Payer: Self-pay | Admitting: Surgery

## 2015-01-10 DIAGNOSIS — Z79899 Other long term (current) drug therapy: Secondary | ICD-10-CM

## 2015-01-10 DIAGNOSIS — R600 Localized edema: Secondary | ICD-10-CM

## 2015-01-10 MED ORDER — METOLAZONE 2.5 MG PO TABS: 2.5000 mg | ORAL_TABLET | Freq: Every day | ORAL | Status: AC

## 2015-01-10 NOTE — Telephone Encounter (Signed)
Pt gained 7lbs this week and he is short of breath. She was told by the nurse to call and let Dr [place] [facility] know about this.[name]

## 2015-01-10 NOTE — Telephone Encounter (Signed)
Spoke with patient's wife. She reports over the past week he has gained 7 lbs and is SOB at rest and it is worse with exertion. His weight today is 297 lbs. He has NO chest pain. He has NO lightheadedness/dizziness. He does endorse swelling bilateral LE and abdomen. Patient sleeps on 2 pillows - this is normal.   Patient has OV on 4/13 in [place]  Patient takes lasix 120mg  BID and K TID

## 2015-01-10 NOTE — Telephone Encounter (Signed)
I called to check in with [name] after his recent hospitalization.  He says that his weight has progressively gone up--(297lbs today)--he was 289 lbs on 4/3 day of discharge.  He also says he is "as SOB as can be".   He says that he has a scheduled appt with Dr Antoine Poche on

## 2015-01-10 NOTE — Telephone Encounter (Signed)
Spoke with Dr. Antoine Poche - he advised that patient take metolazone 2.5mg  today before lasix, tomorrow & Sunday before AM dose of lasix. Rx sent to pharmacy. He also advised BMET on Monday 4/11 and for patient to wear compression hose - thigh high. Patient can go to [facility] to obtain these.   BMET ordered and order for compression hose faxed to [facility] @ [phone].

## 2015-01-10 NOTE — Telephone Encounter (Signed)
He has appt on 4/13.  He tells me that he has all medications that were ordered and is taking as prescribed.   I have asked him to call Dr. Jenene Slicker office to give them an update of this information and if he feels he needs immediate medical assistance to come to the ED.  He understands and is agreement.

## 2015-01-13 ENCOUNTER — Other Ambulatory Visit (INDEPENDENT_AMBULATORY_CARE_PROVIDER_SITE_OTHER): Payer: BLUE CROSS/BLUE SHIELD

## 2015-01-13 DIAGNOSIS — R799 Abnormal finding of blood chemistry, unspecified: Secondary | ICD-10-CM | POA: Diagnosis not present

## 2015-01-13 NOTE — Progress Notes (Signed)
Lab only 

## 2015-01-14 LAB — BASIC METABOLIC PANEL
BUN: 71 mg/dL — AB (ref 6–23)
CALCIUM: 8.7 mg/dL (ref 8.4–10.5)
CO2: 23 mEq/L (ref 19–32)
Chloride: 101 mEq/L (ref 96–112)
Creat: 3.56 mg/dL — ABNORMAL HIGH (ref 0.50–1.35)
GLUCOSE: 78 mg/dL (ref 70–99)
Potassium: 6.1 mEq/L — ABNORMAL HIGH (ref 3.5–5.3)
Sodium: 134 mEq/L — ABNORMAL LOW (ref 135–145)

## 2015-01-15 ENCOUNTER — Inpatient Hospital Stay (HOSPITAL_COMMUNITY)
Admission: AD | Admit: 2015-01-15 | Discharge: 2015-02-02 | DRG: 292 | Disposition: E | Payer: BLUE CROSS/BLUE SHIELD | Source: Ambulatory Visit | Attending: Cardiology | Admitting: Cardiology

## 2015-01-15 ENCOUNTER — Inpatient Hospital Stay (HOSPITAL_COMMUNITY): Payer: BLUE CROSS/BLUE SHIELD

## 2015-01-15 ENCOUNTER — Encounter: Payer: Self-pay | Admitting: Cardiology

## 2015-01-15 ENCOUNTER — Ambulatory Visit (INDEPENDENT_AMBULATORY_CARE_PROVIDER_SITE_OTHER): Payer: BLUE CROSS/BLUE SHIELD | Admitting: Cardiology

## 2015-01-15 VITALS — BP 100/59 | HR 68 | Ht 73.0 in | Wt 302.0 lb

## 2015-01-15 DIAGNOSIS — E039 Hypothyroidism, unspecified: Secondary | ICD-10-CM | POA: Diagnosis present

## 2015-01-15 DIAGNOSIS — I6789 Other cerebrovascular disease: Secondary | ICD-10-CM | POA: Diagnosis not present

## 2015-01-15 DIAGNOSIS — E46 Unspecified protein-calorie malnutrition: Secondary | ICD-10-CM | POA: Diagnosis present

## 2015-01-15 DIAGNOSIS — Z6841 Body Mass Index (BMI) 40.0 and over, adult: Secondary | ICD-10-CM

## 2015-01-15 DIAGNOSIS — I509 Heart failure, unspecified: Secondary | ICD-10-CM

## 2015-01-15 DIAGNOSIS — K7460 Unspecified cirrhosis of liver: Secondary | ICD-10-CM | POA: Diagnosis present

## 2015-01-15 DIAGNOSIS — I5020 Unspecified systolic (congestive) heart failure: Secondary | ICD-10-CM | POA: Diagnosis not present

## 2015-01-15 DIAGNOSIS — Z66 Do not resuscitate: Secondary | ICD-10-CM | POA: Diagnosis not present

## 2015-01-15 DIAGNOSIS — E669 Obesity, unspecified: Secondary | ICD-10-CM | POA: Diagnosis present

## 2015-01-15 DIAGNOSIS — I421 Obstructive hypertrophic cardiomyopathy: Secondary | ICD-10-CM | POA: Diagnosis present

## 2015-01-15 DIAGNOSIS — I442 Atrioventricular block, complete: Secondary | ICD-10-CM | POA: Diagnosis not present

## 2015-01-15 DIAGNOSIS — Z833 Family history of diabetes mellitus: Secondary | ICD-10-CM | POA: Diagnosis not present

## 2015-01-15 DIAGNOSIS — Z86718 Personal history of other venous thrombosis and embolism: Secondary | ICD-10-CM

## 2015-01-15 DIAGNOSIS — N400 Enlarged prostate without lower urinary tract symptoms: Secondary | ICD-10-CM | POA: Diagnosis present

## 2015-01-15 DIAGNOSIS — Z95810 Presence of automatic (implantable) cardiac defibrillator: Secondary | ICD-10-CM

## 2015-01-15 DIAGNOSIS — D72829 Elevated white blood cell count, unspecified: Secondary | ICD-10-CM | POA: Diagnosis not present

## 2015-01-15 DIAGNOSIS — Z992 Dependence on renal dialysis: Secondary | ICD-10-CM | POA: Insufficient documentation

## 2015-01-15 DIAGNOSIS — E119 Type 2 diabetes mellitus without complications: Secondary | ICD-10-CM | POA: Diagnosis present

## 2015-01-15 DIAGNOSIS — Z515 Encounter for palliative care: Secondary | ICD-10-CM | POA: Diagnosis not present

## 2015-01-15 DIAGNOSIS — Z841 Family history of disorders of kidney and ureter: Secondary | ICD-10-CM | POA: Diagnosis not present

## 2015-01-15 DIAGNOSIS — R451 Restlessness and agitation: Secondary | ICD-10-CM | POA: Diagnosis not present

## 2015-01-15 DIAGNOSIS — E785 Hyperlipidemia, unspecified: Secondary | ICD-10-CM | POA: Diagnosis present

## 2015-01-15 DIAGNOSIS — K7581 Nonalcoholic steatohepatitis (NASH): Secondary | ICD-10-CM | POA: Diagnosis present

## 2015-01-15 DIAGNOSIS — D689 Coagulation defect, unspecified: Secondary | ICD-10-CM | POA: Diagnosis present

## 2015-01-15 DIAGNOSIS — I1310 Hypertensive heart and chronic kidney disease without heart failure, with stage 1 through stage 4 chronic kidney disease, or unspecified chronic kidney disease: Secondary | ICD-10-CM | POA: Diagnosis present

## 2015-01-15 DIAGNOSIS — Z8249 Family history of ischemic heart disease and other diseases of the circulatory system: Secondary | ICD-10-CM

## 2015-01-15 DIAGNOSIS — N184 Chronic kidney disease, stage 4 (severe): Secondary | ICD-10-CM | POA: Diagnosis present

## 2015-01-15 DIAGNOSIS — I959 Hypotension, unspecified: Secondary | ICD-10-CM | POA: Diagnosis not present

## 2015-01-15 DIAGNOSIS — Z888 Allergy status to other drugs, medicaments and biological substances status: Secondary | ICD-10-CM

## 2015-01-15 DIAGNOSIS — Z7901 Long term (current) use of anticoagulants: Secondary | ICD-10-CM

## 2015-01-15 DIAGNOSIS — Z794 Long term (current) use of insulin: Secondary | ICD-10-CM

## 2015-01-15 DIAGNOSIS — Z4509 Encounter for adjustment and management of other cardiac device: Secondary | ICD-10-CM | POA: Diagnosis not present

## 2015-01-15 DIAGNOSIS — R570 Cardiogenic shock: Secondary | ICD-10-CM | POA: Diagnosis not present

## 2015-01-15 DIAGNOSIS — K5900 Constipation, unspecified: Secondary | ICD-10-CM | POA: Diagnosis not present

## 2015-01-15 DIAGNOSIS — D631 Anemia in chronic kidney disease: Secondary | ICD-10-CM | POA: Diagnosis present

## 2015-01-15 DIAGNOSIS — E871 Hypo-osmolality and hyponatremia: Secondary | ICD-10-CM | POA: Diagnosis present

## 2015-01-15 DIAGNOSIS — R188 Other ascites: Secondary | ICD-10-CM | POA: Diagnosis present

## 2015-01-15 DIAGNOSIS — I4891 Unspecified atrial fibrillation: Secondary | ICD-10-CM | POA: Diagnosis not present

## 2015-01-15 DIAGNOSIS — I5043 Acute on chronic combined systolic (congestive) and diastolic (congestive) heart failure: Secondary | ICD-10-CM | POA: Diagnosis present

## 2015-01-15 DIAGNOSIS — I472 Ventricular tachycardia: Secondary | ICD-10-CM | POA: Diagnosis not present

## 2015-01-15 DIAGNOSIS — N186 End stage renal disease: Secondary | ICD-10-CM | POA: Diagnosis not present

## 2015-01-15 DIAGNOSIS — Z87891 Personal history of nicotine dependence: Secondary | ICD-10-CM | POA: Diagnosis not present

## 2015-01-15 DIAGNOSIS — N179 Acute kidney failure, unspecified: Secondary | ICD-10-CM | POA: Diagnosis present

## 2015-01-15 DIAGNOSIS — R0600 Dyspnea, unspecified: Secondary | ICD-10-CM | POA: Diagnosis not present

## 2015-01-15 DIAGNOSIS — I213 ST elevation (STEMI) myocardial infarction of unspecified site: Secondary | ICD-10-CM | POA: Diagnosis not present

## 2015-01-15 DIAGNOSIS — D696 Thrombocytopenia, unspecified: Secondary | ICD-10-CM | POA: Diagnosis present

## 2015-01-15 DIAGNOSIS — I5023 Acute on chronic systolic (congestive) heart failure: Secondary | ICD-10-CM

## 2015-01-15 DIAGNOSIS — F411 Generalized anxiety disorder: Secondary | ICD-10-CM | POA: Diagnosis not present

## 2015-01-15 DIAGNOSIS — N189 Chronic kidney disease, unspecified: Secondary | ICD-10-CM | POA: Diagnosis not present

## 2015-01-15 DIAGNOSIS — I481 Persistent atrial fibrillation: Secondary | ICD-10-CM | POA: Diagnosis not present

## 2015-01-15 DIAGNOSIS — E875 Hyperkalemia: Secondary | ICD-10-CM | POA: Diagnosis present

## 2015-01-15 DIAGNOSIS — I2510 Atherosclerotic heart disease of native coronary artery without angina pectoris: Secondary | ICD-10-CM | POA: Diagnosis present

## 2015-01-15 DIAGNOSIS — Z4502 Encounter for adjustment and management of automatic implantable cardiac defibrillator: Secondary | ICD-10-CM

## 2015-01-15 DIAGNOSIS — R52 Pain, unspecified: Secondary | ICD-10-CM

## 2015-01-15 DIAGNOSIS — J189 Pneumonia, unspecified organism: Secondary | ICD-10-CM

## 2015-01-15 DIAGNOSIS — I429 Cardiomyopathy, unspecified: Secondary | ICD-10-CM | POA: Diagnosis not present

## 2015-01-15 LAB — COMPREHENSIVE METABOLIC PANEL
ALBUMIN: 2.7 g/dL — AB (ref 3.5–5.2)
ALT: 30 U/L (ref 0–53)
ANION GAP: 10 (ref 5–15)
AST: 58 U/L — AB (ref 0–37)
Alkaline Phosphatase: 83 U/L (ref 39–117)
BILIRUBIN TOTAL: 1.2 mg/dL (ref 0.3–1.2)
BUN: 77 mg/dL — AB (ref 6–23)
CO2: 20 mmol/L (ref 19–32)
CREATININE: 4.05 mg/dL — AB (ref 0.50–1.35)
Calcium: 9.2 mg/dL (ref 8.4–10.5)
Chloride: 104 mmol/L (ref 96–112)
GFR calc Af Amer: 17 mL/min — ABNORMAL LOW (ref >90)
GFR calc non Af Amer: 15 mL/min — ABNORMAL LOW (ref >90)
Glucose, Bld: 68 mg/dL — ABNORMAL LOW (ref 70–99)
Potassium: 6.6 mmol/L (ref 3.5–5.1)
Sodium: 134 mmol/L — ABNORMAL LOW (ref 135–145)
TOTAL PROTEIN: 6.7 g/dL (ref 6.0–8.3)

## 2015-01-15 LAB — BASIC METABOLIC PANEL
Anion gap: 11 (ref 5–15)
BUN: 76 mg/dL — AB (ref 6–23)
CALCIUM: 8.9 mg/dL (ref 8.4–10.5)
CO2: 20 mmol/L (ref 19–32)
Chloride: 103 mmol/L (ref 96–112)
Creatinine, Ser: 4.12 mg/dL — ABNORMAL HIGH (ref 0.50–1.35)
GFR calc Af Amer: 17 mL/min — ABNORMAL LOW (ref >90)
GFR, EST NON AFRICAN AMERICAN: 15 mL/min — AB (ref >90)
GLUCOSE: 149 mg/dL — AB (ref 70–99)
Potassium: 5.5 mmol/L — ABNORMAL HIGH (ref 3.5–5.1)
SODIUM: 134 mmol/L — AB (ref 135–145)

## 2015-01-15 LAB — CBC
HCT: 29.7 % — ABNORMAL LOW (ref 39.0–52.0)
Hemoglobin: 9.5 g/dL — ABNORMAL LOW (ref 13.0–17.0)
MCH: 28.2 pg (ref 26.0–34.0)
MCHC: 32 g/dL (ref 30.0–36.0)
MCV: 88.1 fL (ref 78.0–100.0)
PLATELETS: 76 10*3/uL — AB (ref 150–400)
RBC: 3.37 MIL/uL — AB (ref 4.22–5.81)
RDW: 15.7 % — AB (ref 11.5–15.5)
WBC: 5 10*3/uL (ref 4.0–10.5)

## 2015-01-15 LAB — GLUCOSE, CAPILLARY
GLUCOSE-CAPILLARY: 62 mg/dL — AB (ref 70–99)
GLUCOSE-CAPILLARY: 82 mg/dL (ref 70–99)
Glucose-Capillary: 186 mg/dL — ABNORMAL HIGH (ref 70–99)
Glucose-Capillary: 122 mg/dL — ABNORMAL HIGH (ref 70–99)

## 2015-01-15 LAB — TROPONIN I: TROPONIN I: 0.4 ng/mL — AB (ref <0.031)

## 2015-01-15 LAB — MAGNESIUM: Magnesium: 2.4 mg/dL (ref 1.5–2.5)

## 2015-01-15 LAB — PROTIME-INR
INR: 2.09 — AB (ref 0.00–1.49)
PROTHROMBIN TIME: 23.7 s — AB (ref 11.6–15.2)

## 2015-01-15 LAB — CARBOXYHEMOGLOBIN
Carboxyhemoglobin: 1.8 % — ABNORMAL HIGH (ref 0.5–1.5)
METHEMOGLOBIN: 1 % (ref 0.0–1.5)
O2 SAT: 59.2 %
Total hemoglobin: 9.4 g/dL — ABNORMAL LOW (ref 13.5–18.0)

## 2015-01-15 LAB — MRSA PCR SCREENING: MRSA by PCR: NEGATIVE

## 2015-01-15 LAB — BRAIN NATRIURETIC PEPTIDE: B Natriuretic Peptide: 919.3 pg/mL — ABNORMAL HIGH (ref 0.0–100.0)

## 2015-01-15 LAB — TSH: TSH: 0.009 u[IU]/mL — ABNORMAL LOW (ref 0.350–4.500)

## 2015-01-15 MED ORDER — FUROSEMIDE 10 MG/ML IJ SOLN
80.0000 mg | Freq: Once | INTRAMUSCULAR | Status: AC
  Administered 2015-01-15: 80 mg via INTRAVENOUS

## 2015-01-15 MED ORDER — ACETAMINOPHEN 325 MG PO TABS: 650.0000 mg | ORAL_TABLET | ORAL | Status: DC | PRN

## 2015-01-15 MED ORDER — MEXILETINE HCL 150 MG PO CAPS
150.0000 mg | ORAL_CAPSULE | Freq: Three times a day (TID) | ORAL | Status: DC
  Administered 2015-01-15 – 2015-01-22 (×21): 150 mg via ORAL
  Filled 2015-01-15 (×23): qty 1

## 2015-01-15 MED ORDER — LEVOTHYROXINE SODIUM 150 MCG PO TABS
187.5000 ug | ORAL_TABLET | Freq: Every day | ORAL | Status: DC
  Filled 2015-01-15: qty 1.5

## 2015-01-15 MED ORDER — INSULIN ASPART 100 UNIT/ML ~~LOC~~ SOLN
16.0000 [IU] | Freq: Every day | SUBCUTANEOUS | Status: DC
  Administered 2015-01-15 – 2015-01-21 (×5): 16 [IU] via SUBCUTANEOUS

## 2015-01-15 MED ORDER — DEXTROSE 50 % IV SOLN
50.0000 mL | Freq: Once | INTRAVENOUS | Status: AC
  Administered 2015-01-15: 50 mL via INTRAVENOUS
  Filled 2015-01-15: qty 50

## 2015-01-15 MED ORDER — SODIUM CHLORIDE 0.9 % IV SOLN: 250.0000 mL | INTRAVENOUS | Status: DC | PRN

## 2015-01-15 MED ORDER — INSULIN ASPART 100 UNIT/ML ~~LOC~~ SOLN: 0.0000 [IU] | Freq: Every day | SUBCUTANEOUS | Status: DC

## 2015-01-15 MED ORDER — ISOSORBIDE MONONITRATE ER 30 MG PO TB24
30.0000 mg | ORAL_TABLET | Freq: Every day | ORAL | Status: DC
Start: 2015-01-15 — End: 2015-01-17
  Administered 2015-01-16 – 2015-01-17 (×2): 30 mg via ORAL
  Filled 2015-01-15 (×3): qty 1

## 2015-01-15 MED ORDER — DEXTROSE 50 % IV SOLN
INTRAVENOUS | Status: AC
  Filled 2015-01-15: qty 50

## 2015-01-15 MED ORDER — LINAGLIPTIN 5 MG PO TABS
5.0000 mg | ORAL_TABLET | Freq: Every day | ORAL | Status: DC
  Administered 2015-01-16 – 2015-01-22 (×7): 5 mg via ORAL
  Filled 2015-01-15 (×7): qty 1

## 2015-01-15 MED ORDER — APIXABAN 5 MG PO TABS
5.0000 mg | ORAL_TABLET | Freq: Two times a day (BID) | ORAL | Status: DC
  Filled 2015-01-15 (×2): qty 1

## 2015-01-15 MED ORDER — LEVOTHYROXINE SODIUM 75 MCG PO TABS
37.5000 ug | ORAL_TABLET | Freq: Every day | ORAL | Status: DC
Start: 2015-01-16 — End: 2015-01-15

## 2015-01-15 MED ORDER — ROSUVASTATIN CALCIUM 10 MG PO TABS
10.0000 mg | ORAL_TABLET | Freq: Every day | ORAL | Status: DC
  Administered 2015-01-16 – 2015-01-22 (×7): 10 mg via ORAL
  Filled 2015-01-15 (×8): qty 1

## 2015-01-15 MED ORDER — SODIUM POLYSTYRENE SULFONATE 15 GM/60ML PO SUSP
30.0000 g | Freq: Once | ORAL | Status: AC
  Administered 2015-01-15: 30 g via ORAL
  Filled 2015-01-15: qty 120

## 2015-01-15 MED ORDER — SODIUM CHLORIDE 0.9 % IV SOLN
1.0000 g | Freq: Once | INTRAVENOUS | Status: AC
  Administered 2015-01-15: 1 g via INTRAVENOUS
  Filled 2015-01-15: qty 10

## 2015-01-15 MED ORDER — SODIUM CHLORIDE 0.9 % IJ SOLN: 3.0000 mL | INTRAMUSCULAR | Status: DC | PRN

## 2015-01-15 MED ORDER — INSULIN ASPART 100 UNIT/ML ~~LOC~~ SOLN
0.0000 [IU] | Freq: Three times a day (TID) | SUBCUTANEOUS | Status: DC
  Administered 2015-01-15 – 2015-01-16 (×3): 3 [IU] via SUBCUTANEOUS
  Administered 2015-01-17: 5 [IU] via SUBCUTANEOUS
  Administered 2015-01-17 – 2015-01-21 (×5): 2 [IU] via SUBCUTANEOUS

## 2015-01-15 MED ORDER — FUROSEMIDE 10 MG/ML IJ SOLN
15.0000 mg/h | INTRAVENOUS | Status: DC
  Administered 2015-01-15 – 2015-01-16 (×2): 12 mg/h via INTRAVENOUS
  Filled 2015-01-15 (×4): qty 25

## 2015-01-15 MED ORDER — INSULIN GLARGINE 100 UNIT/ML ~~LOC~~ SOLN
50.0000 [IU] | Freq: Two times a day (BID) | SUBCUTANEOUS | Status: DC
  Administered 2015-01-15 – 2015-01-22 (×14): 50 [IU] via SUBCUTANEOUS
  Filled 2015-01-15 (×15): qty 0.5

## 2015-01-15 MED ORDER — INSULIN LISPRO 100 UNIT/ML (KWIKPEN): 16.0000 [IU] | PEN_INJECTOR | Freq: Every day | SUBCUTANEOUS | Status: DC

## 2015-01-15 MED ORDER — HYDRALAZINE HCL 10 MG PO TABS
10.0000 mg | ORAL_TABLET | Freq: Three times a day (TID) | ORAL | Status: DC
Start: 2015-01-15 — End: 2015-01-17
  Administered 2015-01-15 – 2015-01-17 (×7): 10 mg via ORAL
  Filled 2015-01-15 (×9): qty 1

## 2015-01-15 MED ORDER — SODIUM CHLORIDE 0.9 % IJ SOLN
3.0000 mL | Freq: Two times a day (BID) | INTRAMUSCULAR | Status: DC
  Administered 2015-01-15 – 2015-01-18 (×5): 3 mL via INTRAVENOUS
  Administered 2015-01-19: 10 mL via INTRAVENOUS
  Administered 2015-01-21 – 2015-01-22 (×2): 3 mL via INTRAVENOUS

## 2015-01-15 MED ORDER — MILRINONE IN DEXTROSE 20 MG/100ML IV SOLN
0.2500 ug/kg/min | INTRAVENOUS | Status: DC
  Administered 2015-01-15 – 2015-01-20 (×11): 0.25 ug/kg/min via INTRAVENOUS
  Filled 2015-01-15 (×12): qty 100

## 2015-01-15 MED ORDER — CARVEDILOL 3.125 MG PO TABS
3.1250 mg | ORAL_TABLET | Freq: Two times a day (BID) | ORAL | Status: DC
  Administered 2015-01-15 – 2015-01-18 (×6): 3.125 mg via ORAL
  Filled 2015-01-15 (×10): qty 1

## 2015-01-15 MED ORDER — INSULIN ASPART 100 UNIT/ML ~~LOC~~ SOLN
10.0000 [IU] | Freq: Once | SUBCUTANEOUS | Status: AC
  Administered 2015-01-15: 10 [IU] via INTRAVENOUS

## 2015-01-15 MED ORDER — TAMSULOSIN HCL 0.4 MG PO CAPS
0.4000 mg | ORAL_CAPSULE | Freq: Every day | ORAL | Status: DC
Start: 2015-01-15 — End: 2015-01-22
  Administered 2015-01-16 – 2015-01-22 (×7): 0.4 mg via ORAL
  Filled 2015-01-15 (×8): qty 1

## 2015-01-15 MED ORDER — ONDANSETRON HCL 4 MG/2ML IJ SOLN
4.0000 mg | Freq: Four times a day (QID) | INTRAMUSCULAR | Status: DC | PRN
  Administered 2015-01-18 (×2): 4 mg via INTRAVENOUS
  Filled 2015-01-15 (×2): qty 2

## 2015-01-15 MED ORDER — LEVOTHYROXINE SODIUM 150 MCG PO TABS
150.0000 ug | ORAL_TABLET | Freq: Every day | ORAL | Status: DC
  Administered 2015-01-16 – 2015-01-22 (×7): 150 ug via ORAL
  Filled 2015-01-15 (×8): qty 1

## 2015-01-15 NOTE — Progress Notes (Signed)
Cardiology Office Note   Date:  01/13/2015   ID:  EDU ON, DOB 11-03-1955, MRN [id number]  PCP:  [name], MD  Cardiologist:   Rollene Rotunda, MD   Chief Complaint  Patient presents with  . Acute on chronic systolic HF      History of Present Illness: [name] is a 59 y.o. male who presents for evaluation of acute on chronic systolic heart failure. He was hospitalized on 320 with this same problem. At that time his weight was 301 pounds. He was diureses and discharged at 289 pounds. His creatinine on discharge was 2.36 with a BUN of 44. Quickly since discharge she has gained weight and is now up to 302 pounds. This was despite calling in getting a course of Zaroxolyn at a low dose.  Blood work this morning demonstrates his sodium is down to 134. Potassium 6.1. BUN is up to 71, creatinine 3.56.  He and his wife both report that he's been very compliant with salt. He doesn't think he is drinking too much liquid. Unfortunately his weight continues to go up. He is very comfortable now. He can't find a comfortable position to sleep in. He has PND and orthopnea. He's having a little chest fullness but no chest pressure. He's had no palpitations, presyncope or syncope.  Past Medical History  Diagnosis Date  . HYPERLIPIDEMIA-MIXED   . Obesity, unspecified   . HYPERTENSION, UNSPECIFIED   . CAD (coronary artery disease)     a. Nonobstructive moderate CAD by cath - last 2012.  [name] Hypertrophic obstructive cardiomyopathy   . Complete heart block     a. s/p pacemaker later upgraded to CRT-D (MDT).  . CKD (chronic kidney disease), stage IV     a. Baseline Cr 1.4-1.5 prior to 09/2014-->2.3-2.8 during admission 09/2014.  [name] Chronic systolic CHF (congestive heart failure)     a. NICM (EF 25% in 2012, 20-25% in 09/2013). b. s/p BiV-ICD (MDT);  c. 10/2014 EF 25%.  . Colon polyps   . Ventricular tachycardia     a. EPS/RFCA of VT in 2012. b. On amio, mexilitene. c. Last BiV-ICD gen  change 05/2013 (MDT).  . Hypothyroidism   . Fatty liver disease, nonalcoholic   . BPH (benign prostatic hyperplasia)   . Habitual alcohol use   . Liver cirrhosis   . 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 DM     insulin dependent   . Left ventricular apical thrombus     a. 10/2014 Echo: 1.5x1.5 cm apical clot-->on eliquis.  [name] NICM (nonischemic cardiomyopathy)     a. 10/2014 Echo: EF 25%, diff HK, 1.5 x 1.5 cm apical clot, mod dil LA.    Past Surgical History  Procedure Laterality Date  . Pacemaker insertion      medtronic  . Cardiac catheterization    . Facial reconstruction surgery      after trauma in 2006  . Bi-ventricular implantable cardioverter defibrillator N/A 05/25/2013    Procedure: BI-VENTRICULAR IMPLANTABLE CARDIOVERTER DEFIBRILLATOR  (CRT-D);  Surgeon: Marinus Maw, MD;  Location: [facility] CATH LAB;  Service: Cardiovascular;  Laterality: N/A;  . Back surgery       Current Outpatient Prescriptions  Medication Sig Dispense Refill  . apixaban (ELIQUIS) 5 MG TABS tablet Take 1 tablet (5 mg total) by mouth 2 (two) times daily. 180 tablet 3  . carvedilol (COREG) 6.25 MG tablet Take 1 tablet (6.25 mg total) by mouth 2 (two)  times daily with a meal. 60 tablet 1  . cholecalciferol (VITAMIN D) 1000 UNITS tablet Take 2,000 Units by mouth daily.     . fish oil-omega-3 fatty acids 1000 MG capsule Take 1 capsule by mouth 3 (three) times daily.     . furosemide (LASIX) 40 MG tablet Take 3 tablets (120 mg total) by mouth 2 (two) times daily. 180 tablet 2  . hydrALAZINE (APRESOLINE) 10 MG tablet Take 1 tablet (10 mg total) by mouth every 8 (eight) hours. 90 tablet 1  . insulin glargine (LANTUS) 100 UNIT/ML injection Inject 50 Units into the skin 2 (two) times daily.  90 mL 3  . insulin lispro (HUMALOG KWIKPEN) 100 UNIT/ML KiwkPen Inject 16 units with largest/evening meal.Dispense quikpens (Patient taking differently: Inject 16 Units into the skin daily with  supper. ) 30 mL 11  . isosorbide mononitrate (IMDUR) 30 MG 24 hr tablet Take 1 tablet (30 mg total) by mouth daily. 90 tablet 1  . levothyroxine (SYNTHROID, LEVOTHROID) 150 MCG tablet Take 150 mcg by mouth daily before breakfast. To be taken with 1/2 of a tab (37.51mcg) to equal 187.    . levothyroxine (SYNTHROID, LEVOTHROID) 75 MCG tablet Take 37.5 mcg by mouth daily before breakfast. To be taken with levo to equal 187.    . linagliptin (TRADJENTA) 5 MG TABS tablet Take 1 tablet (5 mg total) by mouth daily. 90 tablet 1  . metolazone (ZAROXOLYN) 2.5 MG tablet Take 1 tablet (2.5 mg total) by mouth daily. Take 30 minutes prior to AM dose of Lasix. 30 tablet 0  . mexiletine (MEXITIL) 150 MG capsule TAKE 1 CAPSULE EVERY 8 HOURS (Patient taking differently: Take 150 mg by mouth 3 (three) times daily. ) 270 capsule 3  . potassium chloride SA (K-DUR,KLOR-CON) 20 MEQ tablet Take 2 tablets (40 mEq total) by mouth 3 (three) times daily. 180 tablet 2  . rosuvastatin (CRESTOR) 20 MG tablet Take 0.5 tablets (10 mg total) by mouth daily. 30 tablet 0  . tamsulosin (FLOMAX) 0.4 MG CAPS capsule Take 1 capsule (0.4 mg total) by mouth daily. 90 capsule 1   No current facility-administered medications for this visit.    Allergies:   Ace inhibitors; Amiodarone; Nitroglycerin; Procan sr; and Quinidine    Social History:  The patient  reports that he quit smoking about 46 years ago. His smoking use included Cigarettes. He has never used smokeless tobacco. He reports that he drinks alcohol. He reports that he does not use illicit drugs.   Family History:  The patient's family history includes Diabetic kidney disease in his father. There is no history of Coronary artery disease.    ROS:  Please see the history of present illness.   Otherwise, review of systems are positive for none   All other systems are reviewed and negative.    PHYSICAL EXAM: VS:  BP 100/59 mmHg  Pulse 68  Ht 6\' 1"  (1.854  m)  Wt 302 lb (136.986 kg)  BMI 39.85 kg/m2 , BMI Body mass index is 39.85 kg/(m^2). GENERAL:  Acutely ill appearing HEENT:  Pupils equal round and reactive, fundi not visualized, oral mucosa unremarkable NECK:  Positive jugular venous distention to the angle of the jaw at 45, waveform within normal limits, carotid upstroke brisk and symmetric, no bruits, no thyromegaly LYMPHATICS:  No cervical, inguinal adenopathy LUNGS:  Clear to auscultation bilaterally BACK:  No CVA tenderness CHEST:  Unremarkable HEART:  PMI not displaced or sustained,S1 and S2 within  normal limits, no S3, no S4, no clicks, no rubs, distant heart sounds, no murmurs ABD:  Flat, positive bowel sounds normal in frequency in pitch, no bruits, no rebound, no guarding, no midline pulsatile mass, positive hepatomegaly, no splenomegaly, diffusely distended EXT:  2 plus pulses throughout, bilateral severe edema, no cyanosis no clubbing SKIN:  No rashes no nodules NEURO:  Cranial nerves II through XII grossly intact, motor grossly intact throughout PSYCH:  Cognitively intact, oriented to person place and time  EKG:  EKG is ordered today. The ekg ordered today demonstrates AV paced rhythm rate 62   Recent Labs: 08/31/2014: TSH 0.708 09/03/2014: Magnesium 1.7; Pro B Natriuretic peptide (BNP) 1552.0* 12/19/2014: ALT 30 12/30/2014: B Natriuretic Peptide 1273.1*; Hemoglobin 10.7*; Platelets 76* 01/13/2015: BUN 71*; Creatinine 3.56*; Potassium 6.1*; Sodium 134*    Lipid Panel    Component Value Date/Time   CHOL Comment 12/19/2014 0946   CHOL 198 01/30/2013 1016   TRIG Comment 12/19/2014 0946   TRIG 194* 01/30/2013 1016   HDL Comment 12/19/2014 0946   HDL 43 01/30/2013 1016   LDLCALC 73 07/04/2014 0940   LDLCALC 116* 01/30/2013 1016      Wt Readings from Last 3 Encounters:  01/10/2015 302 lb (136.986 kg)  01/05/15 289 lb (131.09 kg)  12/19/14 298 lb (135.172 kg)      Other studies Reviewed: Additional studies/  records that were reviewed today include: Labs, hospital records. Review of the above records demonstrates:  Please see elsewhere in the note.     ASSESSMENT AND PLAN:  ACUTE ON CHRONIC CHF:  He has cardiorenal syndrome.  I spoke with Dr. Jearld Pies and I will admit the patient to ICU stepdown.  He will need diuresis and probably inotropic therapy. He might end up needing dialysis at least short-term. His prognosis is very guarded.  ACUTE ON CHRONIC CKD:  Dr. Jearld Pies is aware the potassium 6.1 Kayexalate likely this. We will follow his basic metabolic profile very closely.  HYPOTHYROIDISM:  His last TSH in November was normal with the should be checked again.  DM:  He will continue on his outpatient regimen with sliding scale insulin adjustments as necessary.     Current medicines are reviewed at length with the patient today.  The patient does not have concerns regarding medicines.  The following changes have been made:  The patient will be admitted   Disposition:   FU with me after the hospitalization.     Signed, Rollene Rotunda, MD  02/01/2015 9:50 AM    [facility]

## 2015-01-15 NOTE — Progress Notes (Addendum)
Patient ID: DAJON LAZAR, male   DOB: 1956-04-27, 59 y.o.   MRN: [id number]     CARDIOLOGY CONSULT NOTE  Patient ID: MACLIN GUERRETTE MRN: [id number] DOB/AGE: [birth year] 59 y.o.  Admit date: Feb 06, 2015 Primary Cardiologist: Hochrein Reason for Consultation: CHF  HPI: 59 yo with history of nonobstructive CAD, nonischemic CMP with chronic systolic CHF, CKD IV, and history of LV apical thrombus presents with CHF.   Patient has had a steadily worsening clinical course.  He was admitted in 3/16 with acute on chronic systolic and diuresed.  He was sent home on Lasix 120 mg bid, which he continues.  Despite the high dose of Lasix, he has steadily gained weight (over 10 lbs in the last week or two).  He is short of breath at rest and has been like this for about a week.  Very dyspneic with any exertion.  Orthopnea requiring elevation of the head of his bed.  Progressive leg and abdominal swelling.  Last echo in 11/15 with EF 25%.    2 days ago, labs showed worsening of creatinine to 3.56 and K to 6.1.  He was seen in the office today by Dr Antoine Poche and directly admitted.   Review of systems complete and found to be negative unless listed above in HPI  Past Medical History: 1. CKD stage IV 2. CAD: Moderate nonobstructive disease on cath in 2012.  3. Cardiomyopathy: Nonischemic (not explained by degree of CAD).  Echo (11/15) was difficult study with EF 25%, RV poorly visualized.  4. H/o LV apical thrombus on Eliquis 5. Type II diabetes 6. HTN 7. Complete heart block s/p Medtronic CRT-D in 2012 8. H/o VT s/p RFCA in 2012.  He is on mexiletine. 9. Hypothyroidism 10. H/o ETOH abuse 11. BPH  Family History  Problem Relation Age of Onset  . Diabetic kidney disease Father   . Coronary artery disease Neg Hx     History   Social History  . Marital Status: Married    Spouse Name: N/A  . Number of Children: 1  . Years of Education: N/A   Occupational History  . Not on file.   Social History  Main Topics  . Smoking status: Former Smoker    Types: Cigarettes    Quit date: 10/04/1968  . Smokeless tobacco: Never Used     Comment: 30+ yrs  . Alcohol Use: Yes     Comment: about 1 cup of liquor daily (straight liquor - not mixed)  . Drug Use: No  . Sexual Activity: Not on file   Other Topics Concern  . Not on file   Social History Narrative   Lives at home with wife, son, daughter in law and two grands.      Prescriptions prior to admission  Medication Sig Dispense Refill Last Dose  . apixaban (ELIQUIS) 5 MG TABS tablet Take 1 tablet (5 mg total) by mouth 2 (two) times daily. 180 tablet 3 Taking  . carvedilol (COREG) 6.25 MG tablet Take 1 tablet (6.25 mg total) by mouth 2 (two) times daily with a meal. 60 tablet 1 Taking  . cholecalciferol (VITAMIN D) 1000 UNITS tablet Take 2,000 Units by mouth daily.    Taking  . fish oil-omega-3 fatty acids 1000 MG capsule Take 1 capsule by mouth 3 (three) times daily.    Taking  . furosemide (LASIX) 40 MG tablet Take 3 tablets (120 mg total) by mouth 2 (two) times daily. 180 tablet 2 Taking  . hydrALAZINE (  APRESOLINE) 10 MG tablet Take 1 tablet (10 mg total) by mouth every 8 (eight) hours. 90 tablet 1 Taking  . insulin glargine (LANTUS) 100 UNIT/ML injection Inject 50 Units into the skin 2 (two) times daily.  90 mL 3 Taking  . insulin lispro (HUMALOG KWIKPEN) 100 UNIT/ML KiwkPen Inject 16 units with largest/evening meal.Dispense quikpens (Patient taking differently: Inject 16 Units into the skin daily with supper. ) 30 mL 11 Taking  . isosorbide mononitrate (IMDUR) 30 MG 24 hr tablet Take 1 tablet (30 mg total) by mouth daily. 90 tablet 1 Taking  . levothyroxine (SYNTHROID, LEVOTHROID) 150 MCG tablet Take 150 mcg by mouth daily before breakfast. To be taken with 1/2 of a tab (37.33mcg) to equal 187.   Taking  . levothyroxine (SYNTHROID, LEVOTHROID) 75 MCG tablet Take 37.5 mcg by mouth daily before breakfast. To be taken with levo  to equal 187.   Taking  . linagliptin (TRADJENTA) 5 MG TABS tablet Take 1 tablet (5 mg total) by mouth daily. 90 tablet 1 Taking  . metolazone (ZAROXOLYN) 2.5 MG tablet Take 1 tablet (2.5 mg total) by mouth daily. Take 30 minutes prior to AM dose of Lasix. 30 tablet 0 Taking  . mexiletine (MEXITIL) 150 MG capsule TAKE 1 CAPSULE EVERY 8 HOURS (Patient taking differently: Take 150 mg by mouth 3 (three) times daily. ) 270 capsule 3 Taking  . potassium chloride SA (K-DUR,KLOR-CON) 20 MEQ tablet Take 2 tablets (40 mEq total) by mouth 3 (three) times daily. 180 tablet 2 Taking  . rosuvastatin (CRESTOR) 20 MG tablet Take 0.5 tablets (10 mg total) by mouth daily. 30 tablet 0 Taking  . tamsulosin (FLOMAX) 0.4 MG CAPS capsule Take 1 capsule (0.4 mg total) by mouth daily. 90 capsule 1 Taking    Physical exam Blood pressure 90/42, pulse 62, temperature 97.9 F (36.6 C), temperature source Oral, resp. rate 20, height  (1.854 m), weight 298 lb 15.1 oz (135.6 kg), SpO2 100 %. General: NAD Neck: JVP 16+ cm, no thyromegaly or thyroid nodule.  Lungs: Clear to auscultation bilaterally with normal respiratory effort. CV: PMI difficult to detect.  Heart somewhat distant, regular S1/S2, no definite S3/S4, no murmur.  2+ edema to thighs, sacral edema.  No carotid bruit.  Unable to palpate pedal pulses due to swelling Abdomen: Soft, nontender, no hepatosplenomegaly, moderate distention.  Skin: Intact without lesions or rashes.  Neurologic: Alert and oriented x 3.  Psych: Normal affect. Extremities: No clubbing or cyanosis.  HEENT: Normal.   Labs:   Lab Results  Component Value Date   WBC 5.7 12/30/2014   HGB 10.7* 12/30/2014   HCT 32.9* 12/30/2014   MCV 87.7 12/30/2014   PLT 76* 12/30/2014    Recent Labs Lab 01/13/15 1230  NA 134*  K 6.1*  CL 101  CO2 23  BUN 71*  CREATININE 3.56*  CALCIUM 8.7  GLUCOSE 78   Radiology: Pending CXR  Telemetry: a-paced, v-paced  ASSESSMENT AND  PLAN: 59 yo with history of nonobstructive CAD, nonischemic CMP with chronic systolic CHF, CKD IV, and history of LV apical thrombus presents with CHF. 1. Acute on chronic systolic CHF: NYHA class IV symptoms with marked volume overload.  SBP ranging 80s-100s for the most part since arrival.  I suspect low output failure with cardiorenal syndrome.   - I will place a central line (IJ). Will use to follow CVPs and co-ox.  - After initial co-ox sent, will begin milrinone 0.25 mcg/kg/min +  Lasix 80 mg IV bolus then 12 mg/hr.  - Decrease Coreg to 3.125 mg bid.  Try to continue low dose hydralazine/Imdur for now.  - Echo: Make sure no pericardial effusion (soft heart sounds).  2. CKD IV: Suspect cardiorenal syndrome.  Most recent BMET with creatinine up to 3.56.  He is markedly volume overloaded, suspect diuresis is going to be difficult.  If milrinone + Lasix gtt does not allow effective diuresis and creatinine continues to rise, may have to consider at least short-term HD for volume removal.  I suspect he would be a poor long-term HD candidate.  3. H/o CHB: Has Medtronic CRT-D device.  4. Hyperkalemia: Await today's BMET, will correct if needed.  5. H/o VT: Continue Mexiletine.  6. H/o LV apical thrombus: Continue Eliquis for now.   45 minutes critical care time.  Signed: Marca Ancona 01/22/2015 1:24 PM  K 6.6, creatinine 4.  I will treat hyperkalemia (Kayexalate, insulin, D50, calcium gluconate).  Not sure that we will be able to remove fluid without HD.   Marca Ancona 01/18/2015 2:53 PM

## 2015-01-15 NOTE — Progress Notes (Signed)
Potassium 5.5.  Decreased from previous potassium level 6.6.  Paged MD Sivak to inform. Ivery Quale, RN

## 2015-01-15 NOTE — Procedures (Signed)
Central Venous Catheter Insertion Procedure Note [name] [id number] [birth year]  Procedure: Insertion of Central Venous Catheter Indications: Assessment of intravascular volume, Drug and/or fluid administration and Frequent blood sampling  Procedure Details Consent: Risks of procedure as well as the alternatives and risks of each were explained to the (patient/caregiver).  Consent for procedure obtained. Time Out: Verified patient identification, verified procedure, site/side was marked, verified correct patient position, special equipment/implants available, medications/allergies/relevent history reviewed, required imaging and test results available.  Performed  Maximum sterile technique was used including antiseptics, cap, gloves, gown, hand hygiene, mask and sheet.  Skin prep: Chlorhexidine; local anesthetic administered A antimicrobial bonded/coated triple lumen catheter was placed in the right internal jugular vein using the Seldinger technique.  Evaluation Blood flow good Complications: No apparent complications Patient did tolerate procedure well. Chest X-ray ordered to verify placement.  CXR: pending.   Procedure performed under direct supervision of Dr. Kendrick Fries and with ultrasound guidance for real time vessel cannulation.      Canary Brim, NP-C [facility] Pgr: [phone] or [phone]   01/14/2015, 2:07 PM  Attending:  The procedure was performed under my direction and supervision.  Heber Hopedale, MD [facility] Pager: [phone] Cell: [phone] If no response, call [phone]

## 2015-01-16 DIAGNOSIS — N179 Acute kidney failure, unspecified: Secondary | ICD-10-CM

## 2015-01-16 DIAGNOSIS — I6789 Other cerebrovascular disease: Secondary | ICD-10-CM

## 2015-01-16 LAB — URINE MICROSCOPIC-ADD ON

## 2015-01-16 LAB — SODIUM, URINE, RANDOM

## 2015-01-16 LAB — GLUCOSE, CAPILLARY
GLUCOSE-CAPILLARY: 89 mg/dL (ref 70–99)
GLUCOSE-CAPILLARY: 186 mg/dL — AB (ref 70–99)
GLUCOSE-CAPILLARY: 177 mg/dL — AB (ref 70–99)
GLUCOSE-CAPILLARY: 185 mg/dL — AB (ref 70–99)

## 2015-01-16 LAB — URINALYSIS, ROUTINE W REFLEX MICROSCOPIC
GLUCOSE, UA: NEGATIVE mg/dL
Hgb urine dipstick: NEGATIVE
KETONES UR: NEGATIVE mg/dL
Leukocytes, UA: NEGATIVE
Nitrite: NEGATIVE
PH: 5 (ref 5.0–8.0)
PROTEIN: 30 mg/dL — AB
SPECIFIC GRAVITY, URINE: 1.014 (ref 1.005–1.030)
Urobilinogen, UA: 0.2 mg/dL (ref 0.0–1.0)

## 2015-01-16 LAB — CARBOXYHEMOGLOBIN
CARBOXYHEMOGLOBIN: 1.6 % — AB (ref 0.5–1.5)
Carboxyhemoglobin: 1.1 % (ref 0.5–1.5)
METHEMOGLOBIN: 1.4 % (ref 0.0–1.5)
Methemoglobin: 0.8 % (ref 0.0–1.5)
O2 SAT: 66.4 %
O2 Saturation: 73.4 %
TOTAL HEMOGLOBIN: 12 g/dL — AB (ref 13.5–18.0)
Total hemoglobin: 12.8 g/dL — ABNORMAL LOW (ref 13.5–18.0)

## 2015-01-16 LAB — CBC
HEMATOCRIT: 25 % — AB (ref 39.0–52.0)
HEMOGLOBIN: 8.2 g/dL — AB (ref 13.0–17.0)
MCH: 28.8 pg (ref 26.0–34.0)
MCHC: 32.8 g/dL (ref 30.0–36.0)
MCV: 87.7 fL (ref 78.0–100.0)
Platelets: 72 10*3/uL — ABNORMAL LOW (ref 150–400)
RBC: 2.85 MIL/uL — AB (ref 4.22–5.81)
RDW: 15.6 % — ABNORMAL HIGH (ref 11.5–15.5)
WBC: 4.9 10*3/uL (ref 4.0–10.5)

## 2015-01-16 LAB — BASIC METABOLIC PANEL
ANION GAP: 10 (ref 5–15)
BUN: 76 mg/dL — ABNORMAL HIGH (ref 6–23)
CHLORIDE: 104 mmol/L (ref 96–112)
CO2: 22 mmol/L (ref 19–32)
CREATININE: 4.32 mg/dL — AB (ref 0.50–1.35)
Calcium: 8.9 mg/dL (ref 8.4–10.5)
GFR calc non Af Amer: 14 mL/min — ABNORMAL LOW (ref >90)
GFR, EST AFRICAN AMERICAN: 16 mL/min — AB (ref >90)
Glucose, Bld: 90 mg/dL (ref 70–99)
POTASSIUM: 5.4 mmol/L — AB (ref 3.5–5.1)
SODIUM: 136 mmol/L (ref 135–145)

## 2015-01-16 LAB — APTT
APTT: 60 s — AB (ref 24–37)
aPTT: 68 seconds — ABNORMAL HIGH (ref 24–37)

## 2015-01-16 LAB — HEPARIN LEVEL (UNFRACTIONATED): Heparin Unfractionated: 0.96 IU/mL — ABNORMAL HIGH (ref 0.30–0.70)

## 2015-01-16 LAB — T4, FREE: FREE T4: 3.54 ng/dL — AB (ref 0.80–1.80)

## 2015-01-16 MED ORDER — PERFLUTREN LIPID MICROSPHERE
1.0000 mL | INTRAVENOUS | Status: AC | PRN
  Administered 2015-01-16: 2 mL via INTRAVENOUS
  Filled 2015-01-16: qty 10

## 2015-01-16 MED ORDER — METOLAZONE 5 MG PO TABS
5.0000 mg | ORAL_TABLET | Freq: Two times a day (BID) | ORAL | Status: AC
  Administered 2015-01-16 (×2): 5 mg via ORAL
  Filled 2015-01-16 (×2): qty 1

## 2015-01-16 MED ORDER — HEPARIN BOLUS VIA INFUSION
1500.0000 [IU] | Freq: Once | INTRAVENOUS | Status: AC
  Administered 2015-01-16: 1500 [IU] via INTRAVENOUS
  Filled 2015-01-16: qty 1500

## 2015-01-16 MED ORDER — HEPARIN BOLUS VIA INFUSION
4000.0000 [IU] | Freq: Once | INTRAVENOUS | Status: AC
  Administered 2015-01-16: 4000 [IU] via INTRAVENOUS
  Filled 2015-01-16: qty 4000

## 2015-01-16 MED ORDER — FUROSEMIDE 10 MG/ML IJ SOLN
160.0000 mg | Freq: Four times a day (QID) | INTRAVENOUS | Status: DC
  Administered 2015-01-16 – 2015-01-19 (×11): 160 mg via INTRAVENOUS
  Filled 2015-01-16 (×14): qty 16

## 2015-01-16 MED ORDER — HEPARIN (PORCINE) IN NACL 100-0.45 UNIT/ML-% IJ SOLN
1850.0000 [IU]/h | INTRAMUSCULAR | Status: DC
  Administered 2015-01-16: 1400 [IU]/h via INTRAVENOUS
  Administered 2015-01-16: 1650 [IU]/h via INTRAVENOUS
  Administered 2015-01-17 – 2015-01-20 (×6): 1850 [IU]/h via INTRAVENOUS
  Filled 2015-01-16 (×18): qty 250

## 2015-01-16 NOTE — Progress Notes (Signed)
Notified nurse that carboxyhemaglobin was put in under wrong patient. Official result sent to nurse, will correct change this morning.

## 2015-01-16 NOTE — Progress Notes (Addendum)
Patient ID: [name], male   DOB: [birth year], 59 y.o.   MRN: [id number]   SUBJECTIVE: Milrinone begun yesterday, patient feels more energetic and less short of breath but diuresing poorly.  CVP 29 with co-ox 66%. K improved with treatment.   Scheduled Meds: . apixaban  5 mg Oral BID  . carvedilol  3.125 mg Oral BID WC  . hydrALAZINE  10 mg Oral 3 times per day  . insulin aspart  0-15 Units Subcutaneous TID WC  . insulin aspart  0-5 Units Subcutaneous QHS  . insulin aspart  16 Units Subcutaneous Q supper  . insulin glargine  50 Units Subcutaneous BID  . isosorbide mononitrate  30 mg Oral Daily  . levothyroxine  150 mcg Oral QAC breakfast  . linagliptin  5 mg Oral Daily  . metolazone  5 mg Oral BID  . mexiletine  150 mg Oral TID  . rosuvastatin  10 mg Oral Daily  . sodium chloride  3 mL Intravenous Q12H  . tamsulosin  0.4 mg Oral Daily   Continuous Infusions: . furosemide (LASIX) infusion 12 mg/hr (01/16/15 0411)  . milrinone 0.25 mcg/kg/min (2015/01/25 2321)   PRN Meds:.sodium chloride, acetaminophen, ondansetron (ZOFRAN) IV, sodium chloride    Filed Vitals:   01/25/2015 2000 2015-01-25 2330 01/16/15 0000 01/16/15 0400  BP: 118/67  119/94 138/55  Pulse: 65  67 67  Temp: 97.6 F (36.4 C) 98 F (36.7 C)  97.6 F (36.4 C)  TempSrc: Oral Oral  Oral  Resp:      Height:     (1.854 m)  Weight:    300 lb (136.079 kg)  SpO2: 98%  97% 97%    Intake/Output Summary (Last 24 hours) at 01/16/15 0752 Last data filed at 01/16/15 0700  Gross per 24 hour  Intake 832.45 ml  Output    650 ml  Net 182.45 ml    LABS: Basic Metabolic Panel:  Recent Labs  78/29/56 1520 2015/01/25 2020 01/16/15 0430  NA  --  134* 136  K  --  5.5* 5.4*  CL  --  103 104  CO2  --  20 22  GLUCOSE  --  149* 90  BUN  --  76* 76*  CREATININE  --  4.12* 4.32*  CALCIUM  --  8.9 8.9  MG 2.4  --   --    Liver Function Tests:  Recent Labs  2015-01-25 1300  AST 58*  ALT 30  ALKPHOS 83  BILITOT  1.2  PROT 6.7  ALBUMIN 2.7*   No results for input(s): LIPASE, AMYLASE in the last 72 hours. CBC:  Recent Labs  Jan 25, 2015 1300 01/16/15 0430  WBC 5.0 4.9  HGB 9.5* 8.2*  HCT 29.7* 25.0*  MCV 88.1 87.7  PLT 76* 72*   Cardiac Enzymes:  Recent Labs  01/25/2015 1300  TROPONINI 0.40*   BNP: Invalid input(s): POCBNP D-Dimer: No results for input(s): DDIMER in the last 72 hours. Hemoglobin A1C: No results for input(s): HGBA1C in the last 72 hours. Fasting Lipid Panel: No results for input(s): CHOL, HDL, LDLCALC, TRIG, CHOLHDL, LDLDIRECT in the last 72 hours. Thyroid Function Tests:  Recent Labs  Jan 25, 2015 1520  TSH 0.009*   Anemia Panel: No results for input(s): VITAMINB12, FOLATE, FERRITIN, TIBC, IRON, RETICCTPCT in the last 72 hours.  RADIOLOGY: Dg Chest 2 View  12/30/2014   CLINICAL DATA:  Retained fluid, swollen belly and feet.  EXAM: CHEST  2 VIEW  COMPARISON:  10/11/2014  FINDINGS: Bilateral interstitial thickening with prominence of the central pulmonary vasculature. Trace bilateral pleural effusions. No pneumothorax. Stable cardiomegaly. There is a cardiac pacemaker present.  Osseous structures are unremarkable.  IMPRESSION: Cardiomegaly with pulmonary vascular congestion.   Electronically Signed   By: Elige Ko   On: 12/30/2014 11:59   US Abdomen Limited  01/05/2015   CLINICAL DATA:  Ascites.  History of hepatic cirrhosis  EXAM: LIMITED ABDOMINAL ULTRASOUND  COMPARISON:  October 04, 2014  FINDINGS: There is moderate diffuse ascites throughout the abdomen with ascites noted in both upper and lower quadrants. There are multiple loops of peristalsing bowel as well appear  IMPRESSION: Moderate generalized ascites.   Electronically Signed   By: Bretta Bang III M.D.   On: 01/17/2015 17:53   Dg Chest Port 1 View  01/20/2015   CLINICAL DATA:  Central catheter placement  EXAM: PORTABLE CHEST - 1 VIEW  COMPARISON:  December 30, 2014  FINDINGS: Central catheter tip is in the  superior vena cava. There are pacemaker leads in the right atrium and right ventricle regions. No pneumothorax. Cardiomegaly with pulmonary vascular within normal limits is stable. Lungs are clear. No adenopathy.  IMPRESSION: Central catheter tip in superior vena cava. No pneumothorax. Cardiomegaly. No change in pacemaker lead placement. No edema or consolidation.   Electronically Signed   By: Bretta Bang III M.D.   On: 01/13/2015 14:22    PHYSICAL EXAM General: NAD Neck: JVP 16+ cm, no thyromegaly or thyroid nodule.  Lungs: Clear to auscultation bilaterally with normal respiratory effort. CV: PMI difficult to detect. Heart somewhat distant, regular S1/S2, no definite S3/S4, no murmur. 2+ edema to thighs, sacral edema. No carotid bruit. Unable to palpate pedal pulses due to swelling Abdomen: Soft, nontender, no hepatosplenomegaly, moderate distention.  Skin: Intact without lesions or rashes.  Neurologic: Alert and oriented x 3.  Psych: Normal affect. Extremities: No clubbing or cyanosis.  HEENT: Normal.   TELEMETRY: Reviewed telemetry pt in a-paced, v-paced rhythm  ASSESSMENT AND PLAN: 59 yo with history of nonobstructive CAD, nonischemic CMP with chronic systolic CHF, CKD IV, and history of LV apical thrombus presents with CHF. 1. Acute on chronic systolic CHF: NYHA class IV symptoms with marked volume overload. I suspect low output failure with cardiorenal syndrome. Co-ox improved to 66% with milrinone, feels better but poor UOP.  CVP remains 29 this morning. BP stable.  - Continue milrinone 0.25 mcg/kg/min - Increase Lasix gtt to 15 mg/hr and will give metolazone 5 mg bid today.   - Continue Coreg 3.125 mg bid. Try to continue low dose hydralazine/Imdur for now.  - Echo: Make sure no pericardial effusion (soft heart sounds), awaiting echo.  - Abdominal US with moderate ascites, will consider paracentesis.  2. CKD IV: Suspect cardiorenal syndrome. Poor diuresis so  far with aggressive regimen and creatinine rising.  As above, will increase diuretics today.  I spoke with Dr Lowell Guitar with renal service who will see today.  I suspect he will need at least short-term HD to remove fluid (massively overloaded).  3. H/o CHB: Has Medtronic CRT-D device.  4. Hyperkalemia: Improved with treatment.   5. H/o VT: Continue Mexiletine.  6. H/o LV apical thrombus: Nearing ESRD, so will transition Eliquis to warfarin.  Will need bridging given chronic apical thrombus.   Marca Ancona 01/16/2015 8:00 AM

## 2015-01-16 NOTE — Consult Note (Signed)
Reason for Consult:AKI/CKD Referring Physician: Shirlee Latch, MD  [name] is an 59 y.o. male.  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 Allena Katz) who was admitted on  2015/01/18 with a one month history of increasing lower extremity edema, abdominal swelling, and weight.  He also reported worsening orthopnea and paroxysmal nocturnal dyspnea, prompting the patient to seek medical attention at [facility] on 3/28 2016 and was admitted for CHF exacerbation and was discharged on 01/05/15.    His creatinine on discharge was 2.36 with a BUN of 44. He was seen in follow up on 18-Jan-2015 by Dr. Antoine Poche who noted that since discharge she has gained weight and is now up to 302 pounds (was 289 at d/c). This was despite being started on a course of Zaroxolyn at a low dose. Blood work that morning demonstrated that his serum sodium was down to 134. Potassium 6.1. BUN is up to 71, creatinine 3.56.  He was directly admitted to [facility] for acute decompensated CHF.   He was noted to have pulmonary edema and worsening renal function and was started on milrinone and lasix drip.  Since admission his Scr has continued to rise (see the trend below) and he has not diuresed well and is now oliguric.  We were asked to help evaluate and manage his AKI/CKD in the setting of decompensated CHF and failure to respond to IV diuresis.     Trend in Creatinine:  CREATININE, SER  Date/Time Value Ref Range Status  01/16/2015 04:30 AM 4.32* 0.50 - 1.35 mg/dL Final  16/07/9603 54:09 PM 4.12* 0.50 - 1.35 mg/dL Final  81/19/1478 29:56 PM 4.05* 0.50 - 1.35 mg/dL Final  21/30/8657 84:69 PM 3.56* 0.50 - 1.35 mg/dL Final  62/95/2841 32:44 PM 2.74* 0.76 - 1.27 mg/dL Final  10/06/7251 66:44 AM 2.36* 0.50 - 1.35 mg/dL Final  03/47/4259 56:38  AM 2.31* 0.50 - 1.35 mg/dL Final  75/64/3329 51:88 AM 2.35* 0.50 - 1.35 mg/dL Final  41/66/0630 16:01 AM 2.16* 0.50 - 1.35 mg/dL Final  09/32/3557 32:20 AM 2.41* 0.50 - 1.35 mg/dL Final  25/42/7062 37:62 AM 2.21* 0.76 - 1.27 mg/dL Final  83/15/1761 60:73 PM 2.04* 0.50 - 1.35 mg/dL Final  71/03/2693 85:46 PM 2.19* 0.50 - 1.35 mg/dL Final  27/12/5007 38:18 PM 2.59* 0.76 - 1.27 mg/dL Final  29/93/7169 67:89 PM 2.82* 0.50 - 1.35 mg/dL Final  38/07/1750 02:58 AM 2.31* 0.76 - 1.27 mg/dL Final  52/77/8242 35:36 AM 2.38* 0.50 - 1.35 mg/dL Final  14/43/1540 08:67 AM 2.58* 0.50 - 1.35 mg/dL Final  61/95/0932 67:12 AM 2.68* 0.50 - 1.35 mg/dL Final  45/80/9983 38:25 AM 2.74* 0.50 - 1.35 mg/dL Final  05/39/7673 41:93 AM 2.89* 0.50 - 1.35 mg/dL Final  79/11/4095 35:32 AM 2.73* 0.50 - 1.35 mg/dL Final  99/24/2683 41:96 AM 2.64* 0.50 - 1.35 mg/dL Final  22/29/7989 21:19 AM 2.69* 0.50 - 1.35 mg/dL Final  41/74/0814 48:18 AM 2.82* 0.50 - 1.35 mg/dL Final  56/31/4970 26:37 AM 3.15* 0.50 - 1.35 mg/dL Final  85/88/5027 74:12 AM 1.70* 0.50 - 1.35 mg/dL Final  87/86/7672 09:47 AM 1.79* 0.50 - 1.35 mg/dL Final  09/62/8366 29:47 AM 2.13* 0.50 - 1.35 mg/dL Final  65/46/5035 46:56 AM 2.38* 0.50 - 1.35 mg/dL Final  81/27/5170 01:74 AM 2.24*  0.50 - 1.35 mg/dL Final  02/72/5366 44:03 PM 2.51* 0.50 - 1.35 mg/dL Final  47/42/5956 38:75 AM 1.64* 0.76 - 1.27 mg/dL Final  64/33/2951 88:41 PM 1.48* 0.76 - 1.27 mg/dL Final  66/03/3015 01:09 PM 1.67* 0.76 - 1.27 mg/dL Final  32/35/5732 20:25 AM 1.67* 0.76 - 1.27 mg/dL Final  42/70/6237 62:83 AM 1.51* 0.76 - 1.27 mg/dL Final  15/17/6160 73:71 PM 1.6* 0.4 - 1.5 mg/dL Final  03/29/9484 46:27 PM 1.8* 0.4 - 1.5 mg/dL Final  03/50/0938 18:29 AM 1.60* 0.50 - 1.35 mg/dL Final  93/71/6967 89:38 AM 2.06* 0.50 - 1.35 mg/dL Final  07/20/5101 58:52 AM 2.20* 0.50 - 1.35 mg/dL Final  77/82/4235 36:14 AM 2.39* 0.50 - 1.35 mg/dL Final  43/15/4008 67:61 AM 2.60* 0.50 - 1.35 mg/dL  Final  95/06/3266 12:45 PM 2.80* 0.50 - 1.35 mg/dL Final  80/99/8338 25:05 PM 2.94* 0.50 - 1.35 mg/dL Final  39/76/7341 93:79 PM 3.30* 0.50 - 1.35 mg/dL Final  02/40/9735 32:99 AM 3.59* 0.50 - 1.35 mg/dL Final  24/26/8341 96:22 AM 3.80* 0.50 - 1.35 mg/dL Final  29/79/8921 19:41 AM 1.49* 0.76 - 1.27 mg/dL Final  74/05/1447 18:56 AM 1.72* 0.76 - 1.27 mg/dL Final  31/49/7026 37:85 AM 1.5 0.4 - 1.5 mg/dL Final  88/50/2774 12:87 AM 1.52* 0.50 - 1.35 mg/dL Final  86/76/7209 47:09 PM 1.7* 0.4 - 1.5 mg/dL Final  62/83/6629 47:65 AM 1.66* 0.50 - 1.35 mg/dL Final    PMH:   Past Medical History  Diagnosis Date  . HYPERLIPIDEMIA-MIXED   . Obesity, unspecified   . HYPERTENSION, UNSPECIFIED   . CAD (coronary artery disease)     a. Nonobstructive moderate CAD by cath - last 2012.  [name] Hypertrophic obstructive cardiomyopathy   . Complete heart block     a. s/p pacemaker later upgraded to CRT-D (MDT).  . CKD (chronic kidney disease), stage IV     a. Baseline Cr 1.4-1.5 prior to 09/2014-->2.3-2.8 during admission 09/2014.  [name] Chronic systolic CHF (congestive heart failure)     a. NICM (EF 25% in 2012, 20-25% in 09/2013). b. s/p BiV-ICD (MDT);  c. 10/2014 EF 25%.  . Colon polyps   . Ventricular tachycardia     a. EPS/RFCA of VT in 2012. b. On amio, mexilitene. c. Last BiV-ICD gen change 05/2013 (MDT).  . Hypothyroidism   . Fatty liver disease, nonalcoholic   . BPH (benign prostatic hyperplasia)   . Habitual alcohol use     Quit Christmas  . Liver cirrhosis   . 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 DM     insulin dependent   . Left ventricular apical thrombus     a. 10/2014 Echo: 1.5x1.5 cm apical clot-->on eliquis.  [name] NICM (nonischemic cardiomyopathy)     a. 10/2014 Echo: EF 25%, diff HK, 1.5 x 1.5 cm apical clot, mod dil LA.    PSH:   Past Surgical History  Procedure Laterality Date  . Pacemaker insertion      medtronic  . Cardiac catheterization    .  Facial reconstruction surgery      after trauma in 2006  . Bi-ventricular implantable cardioverter defibrillator N/A 05/25/2013    Procedure: BI-VENTRICULAR IMPLANTABLE CARDIOVERTER DEFIBRILLATOR  (CRT-D);  Surgeon: Marinus Maw, MD;  Location: [facility] CATH LAB;  Service: Cardiovascular;  Laterality: N/A;  . Back surgery      Allergies:  Allergies  Allergen Reactions  . Ace Inhibitors     AVOID ALL  BECAUSE  OF HEART DISEASE   . Amiodarone     Abnormal LFTs (10/2014)  . Nitroglycerin Other (See Comments)    Reaction unknown  . Procan Sr [Procainamide Hcl]     PT. HAS HEART DISEASE CALLED HYPERTHROPHIC CARDIOMYOPATHY  . Quinidine     PT. HAS HEART DISEASE CALLED HYPERTHROPHIC CARDIOMYOPATHY      Medications:   Prior to Admission medications   Medication Sig Start Date End Date Taking? Authorizing Provider  apixaban (ELIQUIS) 5 MG TABS tablet Take 1 tablet (5 mg total) by mouth 2 (two) times daily. 10/17/14  Yes Ok Anis, NP  carvedilol (COREG) 6.25 MG tablet Take 1 tablet (6.25 mg total) by mouth 2 (two) times daily with a meal. 10/11/14  Yes Jeralyn Bennett, MD  cholecalciferol (VITAMIN D) 1000 UNITS tablet Take 2,000 Units by mouth daily at 12 noon.    Yes Historical Provider, MD  fish oil-omega-3 fatty acids 1000 MG capsule Take 1 capsule by mouth 3 (three) times daily.    Yes Historical Provider, MD  furosemide (LASIX) 40 MG tablet Take 3 tablets (120 mg total) by mouth 2 (two) times daily. 01/05/15  Yes Azalee Course, PA  hydrALAZINE (APRESOLINE) 10 MG tablet Take 1 tablet (10 mg total) by mouth every 8 (eight) hours. 12/24/14  Yes Ernestina Penna, MD  insulin glargine (LANTUS) 100 UNIT/ML injection Inject 50 Units into the skin 2 (two) times daily.  07/25/14  Yes Tammy Eckard, PHARMD  insulin lispro (HUMALOG KWIKPEN) 100 UNIT/ML KiwkPen Inject 16 units with largest/evening meal.Dispense quikpens Patient taking differently: Inject 16 Units into the skin daily with supper.  02/28/14  Yes  Ernestina Penna, MD  isosorbide mononitrate (IMDUR) 30 MG 24 hr tablet Take 1 tablet (30 mg total) by mouth daily. 12/24/14  Yes Ernestina Penna, MD  levothyroxine (SYNTHROID, LEVOTHROID) 150 MCG tablet Take 150 mcg by mouth daily before breakfast. To be taken with 1/2 of a tab (37.72mcg) to equal 187.   Yes Historical Provider, MD  levothyroxine (SYNTHROID, LEVOTHROID) 75 MCG tablet Take 37.5 mcg by mouth daily before breakfast. To be taken with levo to equal 187.   Yes Historical Provider, MD  linagliptin (TRADJENTA) 5 MG TABS tablet Take 1 tablet (5 mg total) by mouth daily. Patient taking differently: Take 5 mg by mouth daily at 12 noon.  12/24/14  Yes Ernestina Penna, MD  metolazone (ZAROXOLYN) 2.5 MG tablet Take 1 tablet (2.5 mg total) by mouth daily. Take 30 minutes prior to AM dose of Lasix. 01/10/15  Yes Rollene Rotunda, MD  mexiletine (MEXITIL) 150 MG capsule TAKE 1 CAPSULE EVERY 8 HOURS Patient taking differently: Take 150 mg by mouth 3 (three) times daily.  02/28/14  Yes Ernestina Penna, MD  potassium chloride SA (K-DUR,KLOR-CON) 20 MEQ tablet Take 2 tablets (40 mEq total) by mouth 3 (three) times daily. 01/05/15  Yes Azalee Course, PA  rosuvastatin (CRESTOR) 20 MG tablet Take 0.5 tablets (10 mg total) by mouth daily. Patient taking differently: Take 10 mg by mouth at bedtime.  10/14/14  Yes Ernestina Penna, MD  tamsulosin (FLOMAX) 0.4 MG CAPS capsule Take 1 capsule (0.4 mg total) by mouth daily. 12/24/14  Yes Ernestina Penna, MD    Inpatient medications: . carvedilol  3.125 mg Oral BID WC  . hydrALAZINE  10 mg Oral 3 times per day  . insulin aspart  0-15 Units Subcutaneous TID WC  . insulin aspart  0-5 Units Subcutaneous QHS  .  insulin aspart  16 Units Subcutaneous Q supper  . insulin glargine  50 Units Subcutaneous BID  . isosorbide mononitrate  30 mg Oral Daily  . levothyroxine  150 mcg Oral QAC breakfast  . linagliptin  5 mg Oral Daily  . metolazone  5 mg Oral BID  .  mexiletine  150 mg Oral TID  . rosuvastatin  10 mg Oral Daily  . sodium chloride  3 mL Intravenous Q12H  . tamsulosin  0.4 mg Oral Daily    Discontinued Meds:   Medications Discontinued During This Encounter  Medication Reason  . levothyroxine (SYNTHROID, LEVOTHROID) tablet 37.5 mcg Dose change  . insulin lispro (HUMALOG) KwikPen 16 Units Formulary change  . levothyroxine (SYNTHROID, LEVOTHROID) tablet 225 mcg   . apixaban (ELIQUIS) tablet 5 mg     Social History:  reports that he quit smoking about 46 years ago. His smoking use included Cigarettes. He has never used smokeless tobacco. He reports that he drinks alcohol. He reports that he does not use illicit drugs.  Family History:   Family History  Problem Relation Age of Onset  . Diabetic kidney disease Father   . Coronary artery disease Neg Hx     Pertinent items are noted in HPI. Weight change:   Intake/Output Summary (Last 24 hours) at 01/16/15 1348 Last data filed at 01/16/15 1300  Gross per 24 hour  Intake 1508.92 ml  Output    700 ml  Net 808.92 ml   BP 129/73 mmHg  Pulse 70  Temp(Src) 98.6 F (37 C) (Oral)  Resp 18  Ht 6\' 1"  (1.854 m)  Wt 136.079 kg (300 lb)  BMI 39.59 kg/m2  SpO2 96% Filed Vitals:   01/16/15 0000 01/16/15 0400 01/16/15 0700 01/16/15 1140  BP: 119/94 138/55 114/44 129/73  Pulse: 67 67 70   Temp:  97.6 F (36.4 C) 98.5 F (36.9 C) 98.6 F (37 C)  TempSrc:  Oral Oral Oral  Resp:   18 18  Height:  6\' 1"  (1.854 m)    Weight:  136.079 kg (300 lb)    SpO2: 97% 97% 98% 96%     General appearance: alert, cooperative, moderately obese and anasarca Head: Normocephalic, without obvious abnormality, atraumatic Resp: diminished breath sounds bibasilar Cardio: regular rate and rhythm and no rub GI: distended, +fluid wave, +BS, nontender Extremities: edema 3+  Labs: Basic Metabolic Panel:  Recent Labs Lab 01/13/15 1230 Feb 07, 2015 1300 02-07-15 2020 01/16/15 0430  NA 134* 134* 134* 136   K 6.1* 6.6* 5.5* 5.4*  CL 101 104 103 104  CO2 23 20 20 22   GLUCOSE 78 68* 149* 90  BUN 71* 77* 76* 76*  CREATININE 3.56* 4.05* 4.12* 4.32*  ALBUMIN  --  2.7*  --   --   CALCIUM 8.7 9.2 8.9 8.9   Liver Function Tests:  Recent Labs Lab 2015-02-07 1300  AST 58*  ALT 30  ALKPHOS 83  BILITOT 1.2  PROT 6.7  ALBUMIN 2.7*   No results for input(s): LIPASE, AMYLASE in the last 168 hours. No results for input(s): AMMONIA in the last 168 hours. CBC:  Recent Labs Lab 2015/02/07 1300 01/16/15 0430  WBC 5.0 4.9  HGB 9.5* 8.2*  HCT 29.7* 25.0*  MCV 88.1 87.7  PLT 76* 72*   PT/INR: @LABRCNTIP (inr:5) Cardiac Enzymes: ) Recent Labs Lab 02-07-15 1300  TROPONINI 0.40*   CBG:  Recent Labs Lab Feb 07, 2015 1303 02/07/2015 1633 2015-02-07 2118 01/16/15 0756 01/16/15 1140  GLUCAP 82 186*  122* 89 186*    Iron Studies: No results for input(s): IRON, TIBC, TRANSFERRIN, FERRITIN in the last 168 hours.  Xrays/Other Studies: US Abdomen Limited  Feb 06, 2015   CLINICAL DATA:  Ascites.  History of hepatic cirrhosis  EXAM: LIMITED ABDOMINAL ULTRASOUND  COMPARISON:  October 04, 2014  FINDINGS: There is moderate diffuse ascites throughout the abdomen with ascites noted in both upper and lower quadrants. There are multiple loops of peristalsing bowel as well appear  IMPRESSION: Moderate generalized ascites.   Electronically Signed   By: Bretta Bang III M.D.   On: 06-Feb-2015 17:53   Dg Chest Port 1 View  02-06-2015   CLINICAL DATA:  Central catheter placement  EXAM: PORTABLE CHEST - 1 VIEW  COMPARISON:  December 30, 2014  FINDINGS: Central catheter tip is in the superior vena cava. There are pacemaker leads in the right atrium and right ventricle regions. No pneumothorax. Cardiomegaly with pulmonary vascular within normal limits is stable. Lungs are clear. No adenopathy.  IMPRESSION: Central catheter tip in superior vena cava. No pneumothorax. Cardiomegaly. No change in pacemaker lead placement. No  edema or consolidation.   Electronically Signed   By: Bretta Bang III M.D.   On: Feb 06, 2015 14:22     Assessment/Plan: 1. AKI/CKD- in setting of decompensated CHF worrisome for cardiorenal syndrome.  Now oliguric despite lasix drip.  Dr. Allena Katz had discussed the possible need for hemodialysis if he became resistant to oral diuretics. 1. Will discontinue lasix drip and try large dose boluses of IV lasix with metolazone prior to administration and if he does not respond, will likely require hemodialysis in the next 24-48 hours 2. Discussed hemodialysis with the patient and will have him watch an educational video about HD 3. Continue to follow I's/O's, check renal US to r/o hydro 2. Acute on chronic systolic CHF- currently on milrinone and lasix drip with poor response. 1. As above, if he does not respond to large dose IV lasix will likely require hemodialysis (hopefully temporarily) or CVVHDF if his cardiomyopathy cannot tolerate IHD. 3. Hyperkalemia- improving with IV lasix. Cont to follow. 4. 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  Complicates management significantly 5. DM- per primary service 6. Anemia- likely due to chronic kidney disease.  Will check iron stores and may benefit from initiation of Aranesp. 7. CMP- EF of 25% will make longterm dialysis difficult 8. CAD- chest pain free, per cards 9. Protein malnutrition- likely related to cirrhosis. 10. Disposition- pt with multiple end-stage disease processes.  Poor prognosis and not clear if he would be able to tolerate dialysis.   Raymund Manrique A 01/16/2015, 1:48 PM

## 2015-01-16 NOTE — Progress Notes (Signed)
ANTICOAGULATION CONSULT NOTE - Initial Consult  Pharmacy Consult for  Heparin  Indication: LV apical thrombus  Allergies  Allergen Reactions  . Ace Inhibitors     AVOID ALL  BECAUSE OF HEART DISEASE   . Amiodarone     Abnormal LFTs (10/2014)  . Nitroglycerin Other (See Comments)    Reaction unknown  . Procan Sr [Procainamide Hcl]     PT. HAS HEART DISEASE CALLED HYPERTHROPHIC CARDIOMYOPATHY  . Quinidine     PT. HAS HEART DISEASE CALLED HYPERTHROPHIC CARDIOMYOPATHY      Patient Measurements: Height: 6\' 1"  (185.4 cm) Weight: 300 lb (136.079 kg) IBW/kg (Calculated) : 79.9 Heparin Dosing Weight: 114 kg  Vital Signs: Temp: 98.5 F (36.9 C) (04/14 0700) Temp Source: Oral (04/14 0700) BP: 114/44 mmHg (04/14 0700) Pulse Rate: 70 (04/14 0700)  Labs:  Recent Labs  02-09-2015 1300 09-Feb-2015 2020 01/16/15 0430  HGB 9.5*  --  8.2*  HCT 29.7*  --  25.0*  PLT 76*  --  72*  LABPROT 23.7*  --   --   INR 2.09*  --   --   CREATININE 4.05* 4.12* 4.32*  TROPONINI 0.40*  --   --     Estimated Creatinine Clearance: 26.7 mL/min (by C-G formula based on Cr of 4.32).   Medical History: Past Medical History  Diagnosis Date  . HYPERLIPIDEMIA-MIXED   . Obesity, unspecified   . HYPERTENSION, UNSPECIFIED   . CAD (coronary artery disease)     a. Nonobstructive moderate CAD by cath - last 2012.  [name] Hypertrophic obstructive cardiomyopathy   . Complete heart block     a. s/p pacemaker later upgraded to CRT-D (MDT).  . CKD (chronic kidney disease), stage IV     a. Baseline Cr 1.4-1.5 prior to 09/2014-->2.3-2.8 during admission 09/2014.  [name] Chronic systolic CHF (congestive heart failure)     a. NICM (EF 25% in 2012, 20-25% in 09/2013). b. s/p BiV-ICD (MDT);  c. 10/2014 EF 25%.  . Colon polyps   . Ventricular tachycardia     a. EPS/RFCA of VT in 2012. b. On amio, mexilitene. c. Last BiV-ICD gen change 05/2013 (MDT).  . Hypothyroidism   . Fatty liver disease, nonalcoholic   . BPH (benign  prostatic hyperplasia)   . Habitual alcohol use     Quit Christmas  . Liver cirrhosis   . 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 DM     insulin dependent   . Left ventricular apical thrombus     a. 10/2014 Echo: 1.5x1.5 cm apical clot-->on eliquis.  [name] NICM (nonischemic cardiomyopathy)     a. 10/2014 Echo: EF 25%, diff HK, 1.5 x 1.5 cm apical clot, mod dil LA.      Assessment: 59yom admitted with HF exacerbation and started on milrinone.  He was on apixaban pta for LV apical thrombus.  He is CKD IV and probable soon HD candidate.  Apixaban is now on hold with last dose 4/13 am.  Baseline HL ordered because apixaban affects HL with false elevation.  And aPTT will be used to direct heparin dosing until apixaban is cleared.    Goal of Therapy:  Heparin level 0.3-0.7 units/ml aPTT 60-100  seconds Monitor platelets by anticoagulation protocol: Yes   Plan:  STAT HL to determine baseline and how much is affected by apixaban Heparin bolus 4000 uts IV x1 Heparin drip 1400 uts/hr APTT 6hr after drip started Daily HL, aPTT, CBC  Leota Sauers Pharm.D.  CPP, BCPS Clinical Pharmacist [phone] 01/16/2015 8:24 AM

## 2015-01-16 NOTE — Progress Notes (Signed)
Correct Carboxyhemoglobin results for patient Total Hemoglobin=12.8 O2 Saturation=66.4 Carboxyhemoglobin=1.6 Methemoglobin=0.8 Disregard the results for 0409,results are for another patient. Will delete 0409 results this morning.

## 2015-01-16 NOTE — Progress Notes (Signed)
ANTICOAGULATION CONSULT NOTE - Initial Consult  Pharmacy Consult for  Heparin  Indication: LV apical thrombus  Allergies  Allergen Reactions  . Ace Inhibitors     AVOID ALL  BECAUSE OF HEART DISEASE   . Amiodarone     Abnormal LFTs (10/2014)  . Nitroglycerin Other (See Comments)    Reaction unknown  . Procan Sr [Procainamide Hcl]     PT. HAS HEART DISEASE CALLED HYPERTHROPHIC CARDIOMYOPATHY  . Quinidine     PT. HAS HEART DISEASE CALLED HYPERTHROPHIC CARDIOMYOPATHY      Patient Measurements: Height: 6\' 1"  (185.4 cm) Weight: 300 lb (136.079 kg) IBW/kg (Calculated) : 79.9 Heparin Dosing Weight: 114 kg  Vital Signs: Temp: 98.6 F (37 C) (04/14 1140) Temp Source: Oral (04/14 1140) BP: 129/73 mmHg (04/14 1140) Pulse Rate: 70 (04/14 0700)  Labs:  Recent Labs  06-Feb-2015 1300 2015/02/06 2020 01/16/15 0430 01/16/15 0843 01/16/15 1520  HGB 9.5*  --  8.2*  --   --   HCT 29.7*  --  25.0*  --   --   PLT 76*  --  72*  --   --   APTT  --   --   --   --  60*  LABPROT 23.7*  --   --   --   --   INR 2.09*  --   --   --   --   HEPARINUNFRC  --   --   --  0.96*  --   CREATININE 4.05* 4.12* 4.32*  --   --   TROPONINI 0.40*  --   --   --   --     Estimated Creatinine Clearance: 26.7 mL/min (by C-G formula based on Cr of 4.32).   Medical History: Past Medical History  Diagnosis Date  . HYPERLIPIDEMIA-MIXED   . Obesity, unspecified   . HYPERTENSION, UNSPECIFIED   . CAD (coronary artery disease)     a. Nonobstructive moderate CAD by cath - last 2012.  [name] Hypertrophic obstructive cardiomyopathy   . Complete heart block     a. s/p pacemaker later upgraded to CRT-D (MDT).  . CKD (chronic kidney disease), stage IV     a. Baseline Cr 1.4-1.5 prior to 09/2014-->2.3-2.8 during admission 09/2014.  [name] Chronic systolic CHF (congestive heart failure)     a. NICM (EF 25% in 2012, 20-25% in 09/2013). b. s/p BiV-ICD (MDT);  c. 10/2014 EF 25%.  . Colon polyps   . Ventricular tachycardia    a. EPS/RFCA of VT in 2012. b. On amio, mexilitene. c. Last BiV-ICD gen change 05/2013 (MDT).  . Hypothyroidism   . Fatty liver disease, nonalcoholic   . BPH (benign prostatic hyperplasia)   . Habitual alcohol use     Quit Christmas  . Liver cirrhosis   . 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 DM     insulin dependent   . Left ventricular apical thrombus     a. 10/2014 Echo: 1.5x1.5 cm apical clot-->on eliquis.  [name] NICM (nonischemic cardiomyopathy)     a. 10/2014 Echo: EF 25%, diff HK, 1.5 x 1.5 cm apical clot, mod dil LA.      Assessment: 59yom admitted with HF exacerbation and started on milrinone.  He was on apixaban pta for LV apical thrombus.  He is CKD IV and probable soon HD candidate.  Apixaban is now on hold with last dose 4/13 am.  Baseline HL ordered because apixaban affects HL with false elevation.  And aPTT will be used to direct heparin dosing until apixaban is cleared.    APTT is slightly SUBtherapeutic at 60 second on heparin 1400 units/hr. Nurse reports no issues with infusion or bleeding.  Goal of Therapy:  Heparin level 0.3-0.7 units/ml aPTT 66-102 seconds Monitor platelets by anticoagulation protocol: Yes   Plan:  Heparin bolus 1500 units Heparin drip 1650 units/hr APTT 6hr Daily HL, aPTT, CBC  Arlean Hopping. Newman Pies, PharmD Clinical Pharmacist Pager [phone]  4/14/20164:14 PM

## 2015-01-16 NOTE — Progress Notes (Signed)
  Echocardiogram 2D Echocardiogram has been performed.  Leta Jungling M 01/16/2015, 9:58 AM

## 2015-01-17 ENCOUNTER — Inpatient Hospital Stay (HOSPITAL_COMMUNITY): Payer: BLUE CROSS/BLUE SHIELD

## 2015-01-17 ENCOUNTER — Encounter (HOSPITAL_COMMUNITY): Payer: Self-pay | Admitting: *Deleted

## 2015-01-17 DIAGNOSIS — Z992 Dependence on renal dialysis: Secondary | ICD-10-CM | POA: Insufficient documentation

## 2015-01-17 DIAGNOSIS — N186 End stage renal disease: Secondary | ICD-10-CM

## 2015-01-17 LAB — RENAL FUNCTION PANEL
ALBUMIN: 2.6 g/dL — AB (ref 3.5–5.2)
ANION GAP: 12 (ref 5–15)
BUN: 78 mg/dL — AB (ref 6–23)
CO2: 19 mmol/L (ref 19–32)
Calcium: 8.8 mg/dL (ref 8.4–10.5)
Chloride: 101 mmol/L (ref 96–112)
Creatinine, Ser: 5.02 mg/dL — ABNORMAL HIGH (ref 0.50–1.35)
GFR calc Af Amer: 13 mL/min — ABNORMAL LOW (ref >90)
GFR calc non Af Amer: 11 mL/min — ABNORMAL LOW (ref >90)
Glucose, Bld: 128 mg/dL — ABNORMAL HIGH (ref 70–99)
PHOSPHORUS: 5.4 mg/dL — AB (ref 2.3–4.6)
POTASSIUM: 4.9 mmol/L (ref 3.5–5.1)
Sodium: 132 mmol/L — ABNORMAL LOW (ref 135–145)

## 2015-01-17 LAB — BASIC METABOLIC PANEL
Anion gap: 11 (ref 5–15)
BUN: 77 mg/dL — AB (ref 6–23)
CO2: 20 mmol/L (ref 19–32)
Calcium: 8.9 mg/dL (ref 8.4–10.5)
Chloride: 102 mmol/L (ref 96–112)
Creatinine, Ser: 5.02 mg/dL — ABNORMAL HIGH (ref 0.50–1.35)
GFR calc Af Amer: 13 mL/min — ABNORMAL LOW (ref >90)
GFR, EST NON AFRICAN AMERICAN: 11 mL/min — AB (ref >90)
GLUCOSE: 129 mg/dL — AB (ref 70–99)
Potassium: 5 mmol/L (ref 3.5–5.1)
Sodium: 133 mmol/L — ABNORMAL LOW (ref 135–145)

## 2015-01-17 LAB — FOLATE RBC
Folate, Hemolysate: 316.5 ng/mL
Folate, RBC: 1319 ng/mL (ref >498)
Hematocrit: 24 % — ABNORMAL LOW (ref 37.5–51.0)

## 2015-01-17 LAB — CBC
HCT: 23.4 % — ABNORMAL LOW (ref 39.0–52.0)
HEMOGLOBIN: 7.8 g/dL — AB (ref 13.0–17.0)
MCH: 29.1 pg (ref 26.0–34.0)
MCHC: 33.3 g/dL (ref 30.0–36.0)
MCV: 87.3 fL (ref 78.0–100.0)
PLATELETS: 66 10*3/uL — AB (ref 150–400)
RBC: 2.68 MIL/uL — AB (ref 4.22–5.81)
RDW: 15.5 % (ref 11.5–15.5)
WBC: 5.4 10*3/uL (ref 4.0–10.5)

## 2015-01-17 LAB — FERRITIN: Ferritin: 206 ng/mL (ref 22–322)

## 2015-01-17 LAB — IRON AND TIBC
Iron: 64 ug/dL (ref 42–165)
Saturation Ratios: 30 % (ref 20–55)
TIBC: 214 ug/dL — AB (ref 215–435)
UIBC: 150 ug/dL (ref 125–400)

## 2015-01-17 LAB — APTT
aPTT: 62 seconds — ABNORMAL HIGH (ref 24–37)
aPTT: 70 seconds — ABNORMAL HIGH (ref 24–37)

## 2015-01-17 LAB — HEPARIN LEVEL (UNFRACTIONATED): HEPARIN UNFRACTIONATED: 0.8 [IU]/mL — AB (ref 0.30–0.70)

## 2015-01-17 LAB — PROTIME-INR
INR: 1.54 — ABNORMAL HIGH (ref 0.00–1.49)
PROTHROMBIN TIME: 18.6 s — AB (ref 11.6–15.2)

## 2015-01-17 LAB — VITAMIN B12: VITAMIN B 12: 294 pg/mL (ref 211–911)

## 2015-01-17 LAB — GLUCOSE, CAPILLARY
Glucose-Capillary: 108 mg/dL — ABNORMAL HIGH (ref 70–99)
Glucose-Capillary: 204 mg/dL — ABNORMAL HIGH (ref 70–99)

## 2015-01-17 LAB — T3, FREE: T3 FREE: 5.6 pg/mL — AB (ref 2.0–4.4)

## 2015-01-17 MED ORDER — MIDAZOLAM HCL 2 MG/2ML IJ SOLN
INTRAMUSCULAR | Status: AC | PRN
  Administered 2015-01-17: 1 mg via INTRAVENOUS

## 2015-01-17 MED ORDER — CEFAZOLIN SODIUM-DEXTROSE 2-3 GM-% IV SOLR
INTRAVENOUS | Status: AC
  Administered 2015-01-17: 2 g via INTRAVENOUS
  Filled 2015-01-17: qty 50

## 2015-01-17 MED ORDER — CEFAZOLIN SODIUM-DEXTROSE 2-3 GM-% IV SOLR
2.0000 g | Freq: Once | INTRAVENOUS | Status: AC
  Filled 2015-01-17: qty 50

## 2015-01-17 MED ORDER — FENTANYL CITRATE (PF) 100 MCG/2ML IJ SOLN
INTRAMUSCULAR | Status: AC
  Filled 2015-01-17: qty 2

## 2015-01-17 MED ORDER — LIDOCAINE HCL 1 % IJ SOLN
INTRAMUSCULAR | Status: AC
  Filled 2015-01-17: qty 20

## 2015-01-17 MED ORDER — MIDAZOLAM HCL 2 MG/2ML IJ SOLN
INTRAMUSCULAR | Status: AC
  Filled 2015-01-17: qty 2

## 2015-01-17 MED ORDER — GELATIN ABSORBABLE 12-7 MM EX MISC
CUTANEOUS | Status: AC
  Filled 2015-01-17: qty 1

## 2015-01-17 MED ORDER — HEPARIN SODIUM (PORCINE) 1000 UNIT/ML IJ SOLN
INTRAMUSCULAR | Status: AC
  Filled 2015-01-17: qty 1

## 2015-01-17 MED ORDER — FENTANYL CITRATE (PF) 100 MCG/2ML IJ SOLN
INTRAMUSCULAR | Status: AC | PRN
  Administered 2015-01-17: 50 ug via INTRAVENOUS

## 2015-01-17 MED ORDER — CEFAZOLIN SODIUM-DEXTROSE 2-3 GM-% IV SOLR: 2.0000 g | Freq: Three times a day (TID) | INTRAVENOUS | Status: DC

## 2015-01-17 NOTE — Progress Notes (Signed)
Assessment/Plan: 1. AKI/CKD- in setting of decompensated CHF consistent with cardiorenal syndrome.              We will initiate hemodialysis. I think BP is acceptable currently.   Perm cath per IR.  Not sure if this will be short term or longer. 2. Acute on chronic systolic CHF- currently on milrinone and lasix drip with poor response. 3. Hyperkalemia- stable 4. 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 Complicates management significantly 5. DM- per primary service 6. Anemia- likely due to chronic kidney disease. Will check iron stores and may benefit from initiation of Aranesp. 7. CMP- EF of 25% will make longterm dialysis difficult 8. CAD- chest pain free, per cards 9. Protein malnutrition- likely related to cirrhosis.  Subjective: Interval History: oliguric  Objective: Vital signs in last 24 hours: Temp:  [97.4 F (36.3 C)-98.6 F (37 C)] 97.4 F (36.3 C) (04/15 0731) Pulse Rate:  [68] 68 (04/15 0731) Resp:  [18] 18 (04/15 0731) BP: (97-129)/(32-73) 97/35 mmHg (04/15 0731) SpO2:  [95 %-96 %] 96 % (04/15 0731) Weight:  [138.166 kg (304 lb 9.6 oz)] 138.166 kg (304 lb 9.6 oz) (04/15 0330) Weight change: 2.566 kg (5 lb 10.5 oz)  Intake/Output from previous day: 04/14 0701 - 04/15 0700 In: 2055.5 [P.O.:1200; I.V.:673.5; IV Piggyback:182] Out: 150 [Urine:150] Intake/Output this shift: Total I/O In: 152.1 [I.V.:86.1; IV Piggyback:66] Out: -   General appearance: alert, cooperative and not wearing oxygen Resp: clear to auscultation bilaterally Chest wall: no tenderness Extremities: edema 1-2+  Lab Results:  Recent Labs  01/16/15 0430 01/17/15 0330  WBC 4.9 5.4  HGB 8.2* 7.8*  HCT 25.0* 23.4*  PLT 72* 66*   BMET:  Recent Labs  01/16/15 0430 01/17/15 0330  NA 136 132*  133*  K 5.4* 4.9  5.0  CL 104 101  102  CO2 22 19  20   GLUCOSE 90 128*  129*  BUN 76* 78*  77*  CREATININE 4.32* 5.02*  5.02*  CALCIUM 8.9 8.8  8.9   No results for input(s): PTH  in the last 72 hours. Iron Studies:  Recent Labs  01/16/15 1520  IRON 64  TIBC 214*  FERRITIN 206   Studies/Results: US Abdomen Limited  01/11/2015   CLINICAL DATA:  Ascites.  History of hepatic cirrhosis  EXAM: LIMITED ABDOMINAL ULTRASOUND  COMPARISON:  October 04, 2014  FINDINGS: There is moderate diffuse ascites throughout the abdomen with ascites noted in both upper and lower quadrants. There are multiple loops of peristalsing bowel as well appear  IMPRESSION: Moderate generalized ascites.   Electronically Signed   By: Bretta Bang III M.D.   On: 01/25/2015 17:53   Dg Chest Port 1 View  01/22/2015   CLINICAL DATA:  Central catheter placement  EXAM: PORTABLE CHEST - 1 VIEW  COMPARISON:  December 30, 2014  FINDINGS: Central catheter tip is in the superior vena cava. There are pacemaker leads in the right atrium and right ventricle regions. No pneumothorax. Cardiomegaly with pulmonary vascular within normal limits is stable. Lungs are clear. No adenopathy.  IMPRESSION: Central catheter tip in superior vena cava. No pneumothorax. Cardiomegaly. No change in pacemaker lead placement. No edema or consolidation.   Electronically Signed   By: Bretta Bang III M.D.   On: 01/13/2015 14:22    Scheduled: . carvedilol  3.125 mg Oral BID WC  . furosemide  160 mg Intravenous Q6H  . hydrALAZINE  10 mg Oral 3 times per day  .  insulin aspart  0-15 Units Subcutaneous TID WC  . insulin aspart  0-5 Units Subcutaneous QHS  . insulin aspart  16 Units Subcutaneous Q supper  . insulin glargine  50 Units Subcutaneous BID  . isosorbide mononitrate  30 mg Oral Daily  . levothyroxine  150 mcg Oral QAC breakfast  . linagliptin  5 mg Oral Daily  . mexiletine  150 mg Oral TID  . rosuvastatin  10 mg Oral Daily  . sodium chloride  3 mL Intravenous Q12H  . tamsulosin  0.4 mg Oral Daily     LOS: 2 days   Yolando Gillum C 01/17/2015,11:00 AM

## 2015-01-17 NOTE — Progress Notes (Addendum)
Patient ID: [name], male   DOB: [birth year], 59 y.o.   MRN: [id number]   SUBJECTIVE: Milrinone continues.  Patient seen by nephrology yesterday, Lasix changed to high dose IV boluses.  Minimal response, UOP about 100 cc for 24 hours.  CVP 26.   Scheduled Meds: . carvedilol  3.125 mg Oral BID WC  . furosemide  160 mg Intravenous Q6H  . hydrALAZINE  10 mg Oral 3 times per day  . insulin aspart  0-15 Units Subcutaneous TID WC  . insulin aspart  0-5 Units Subcutaneous QHS  . insulin aspart  16 Units Subcutaneous Q supper  . insulin glargine  50 Units Subcutaneous BID  . isosorbide mononitrate  30 mg Oral Daily  . levothyroxine  150 mcg Oral QAC breakfast  . linagliptin  5 mg Oral Daily  . mexiletine  150 mg Oral TID  . rosuvastatin  10 mg Oral Daily  . sodium chloride  3 mL Intravenous Q12H  . tamsulosin  0.4 mg Oral Daily   Continuous Infusions: . heparin 1,850 Units/hr (01/17/15 0445)  . milrinone 0.25 mcg/kg/min (01/17/15 0348)   PRN Meds:.sodium chloride, acetaminophen, ondansetron (ZOFRAN) IV, sodium chloride    Filed Vitals:   01/16/15 2247 01/17/15 0330 01/17/15 0341 01/17/15 0523  BP: 114/40  109/33 102/33  Pulse:      Temp: 97.4 F (36.3 C) 97.6 F (36.4 C)    TempSrc: Oral Oral    Resp:      Height:  6\' 1"  (1.854 m)    Weight:  304 lb 9.6 oz (138.166 kg)    SpO2: 96% 96%      Intake/Output Summary (Last 24 hours) at 01/17/15 0736 Last data filed at 01/17/15 0600  Gross per 24 hour  Intake 2055.54 ml  Output    150 ml  Net 1905.54 ml    LABS: Basic Metabolic Panel:  Recent Labs  20/80/22 1520  01/16/15 0430 01/17/15 0330  NA  --   < > 136 132*  133*  K  --   < > 5.4* 4.9  5.0  CL  --   < > 104 101  102  CO2  --   < > 22 19  20   GLUCOSE  --   < > 90 128*  129*  BUN  --   < > 76* 78*  77*  CREATININE  --   < > 4.32* 5.02*  5.02*  CALCIUM  --   < > 8.9 8.8  8.9  MG 2.4  --   --   --   PHOS  --   --   --  5.4*  < > = values in this  interval not displayed. Liver Function Tests:  Recent Labs  01/11/2015 1300 01/17/15 0330  AST 58*  --   ALT 30  --   ALKPHOS 83  --   BILITOT 1.2  --   PROT 6.7  --   ALBUMIN 2.7* 2.6*   No results for input(s): LIPASE, AMYLASE in the last 72 hours. CBC:  Recent Labs  01/16/15 0430 01/17/15 0330  WBC 4.9 5.4  HGB 8.2* 7.8*  HCT 25.0* 23.4*  MCV 87.7 87.3  PLT 72* 66*   Cardiac Enzymes:  Recent Labs  01/14/2015 1300  TROPONINI 0.40*   BNP: Invalid input(s): POCBNP D-Dimer: No results for input(s): DDIMER in the last 72 hours. Hemoglobin A1C: No results for input(s): HGBA1C in the last 72 hours. Fasting Lipid Panel: No results  for input(s): CHOL, HDL, LDLCALC, TRIG, CHOLHDL, LDLDIRECT in the last 72 hours. Thyroid Function Tests:  Recent Labs  01/17/2015 1520  TSH 0.009*   Anemia Panel: No results for input(s): VITAMINB12, FOLATE, FERRITIN, TIBC, IRON, RETICCTPCT in the last 72 hours.  RADIOLOGY: Dg Chest 2 View  12/30/2014   CLINICAL DATA:  Retained fluid, swollen belly and feet.  EXAM: CHEST  2 VIEW  COMPARISON:  10/11/2014  FINDINGS: Bilateral interstitial thickening with prominence of the central pulmonary vasculature. Trace bilateral pleural effusions. No pneumothorax. Stable cardiomegaly. There is a cardiac pacemaker present.  Osseous structures are unremarkable.  IMPRESSION: Cardiomegaly with pulmonary vascular congestion.   Electronically Signed   By: Elige Ko   On: 12/30/2014 11:59   US Abdomen Limited  01/22/2015   CLINICAL DATA:  Ascites.  History of hepatic cirrhosis  EXAM: LIMITED ABDOMINAL ULTRASOUND  COMPARISON:  October 04, 2014  FINDINGS: There is moderate diffuse ascites throughout the abdomen with ascites noted in both upper and lower quadrants. There are multiple loops of peristalsing bowel as well appear  IMPRESSION: Moderate generalized ascites.   Electronically Signed   By: Bretta Bang III M.D.   On: 01/03/2015 17:53   Dg Chest Port  1 View  01/10/2015   CLINICAL DATA:  Central catheter placement  EXAM: PORTABLE CHEST - 1 VIEW  COMPARISON:  December 30, 2014  FINDINGS: Central catheter tip is in the superior vena cava. There are pacemaker leads in the right atrium and right ventricle regions. No pneumothorax. Cardiomegaly with pulmonary vascular within normal limits is stable. Lungs are clear. No adenopathy.  IMPRESSION: Central catheter tip in superior vena cava. No pneumothorax. Cardiomegaly. No change in pacemaker lead placement. No edema or consolidation.   Electronically Signed   By: Bretta Bang III M.D.   On: 01/13/2015 14:22    PHYSICAL EXAM General: NAD Neck: JVP 16+ cm, no thyromegaly or thyroid nodule.  Lungs: Clear to auscultation bilaterally with normal respiratory effort. CV: PMI difficult to detect. Heart somewhat distant, regular S1/S2, no definite S3/S4, no murmur. 2+ edema to thighs, sacral edema. No carotid bruit. Unable to palpate pedal pulses due to swelling Abdomen: Soft, nontender, no hepatosplenomegaly, moderate distention.  Skin: Intact without lesions or rashes.  Neurologic: Alert and oriented x 3.  Psych: Normal affect. Extremities: No clubbing or cyanosis.  HEENT: Normal.   TELEMETRY: Reviewed telemetry pt in NSR, v-paced   ASSESSMENT AND PLAN: 59 yo with history of nonobstructive CAD, nonischemic CMP with chronic systolic CHF, CKD IV, and history of LV apical thrombus presents with CHF. 1. Acute on chronic systolic CHF: NYHA class IV symptoms with marked volume overload. Low output failure with cardiorenal syndrome. Echo showed EF 30-35% with mobile apical thrombus (known from past).  Co-ox improved to 66% with milrinone, feels better but poor UOP.  CVP remains 26 this morning. BP on the lower side.  - Continue milrinone 0.25 mcg/kg/min for now. - Minimal effect from maximal dose diuretics.  I think he will need HD vs CVVH for fluid removal.  Nephrology following.  - Continue  Coreg 3.125 mg bid. BP on the soft side, stop hydralazine/Imdur now especially as we are contemplating HD.  - Abdominal US with moderate ascites, will consider paracentesis but suspect we can mobilize the fluid if patient is dialyzed.  2. CKD IV: Suspect cardiorenal syndrome. Poor diuresis with maximal regimen and creatinine rising.  He will need at least short-term HD to remove fluid (massively overloaded) =>  nephrology to see today.  3. H/o CHB: Has [employer] CRT-D device. Currently in NSR.  4. Hyperkalemia: Improved with treatment.   5. H/o VT: Continue Mexiletine.  6. LV apical thrombus: Nearing ESRD, so plan to transition Eliquis to warfarin.  Currently he is on heparin gtt pending HD catheter placement.  After that, can initiate warfarin.  7. Anemia: No active bleeding.  Send hemoccult.  May be anemia of renal disease.    Marca Ancona 01/17/2015 7:36 AM

## 2015-01-17 NOTE — Procedures (Signed)
Interventional Radiology Procedure Note  Procedure: Placement of a right IJ approachtunneled split tip HD catheter, 19cm tip to cuff.   Complications: No immediate Recommendations:   - Do not submerge   - Routine line care  - OK for dialysis  Signed,  Yvone Neu. Loreta Ave, DO

## 2015-01-17 NOTE — H&P (Signed)
Referring Physician(s): Nephrology   History of Present Illness: [name] is a 59 y.o. male with acute on chronic kidney disease with request for tunneled perm catheter for dialysis. He denies any chest pain, shortness of breath or palpitations. He denies any recent fever or chills. He has no known complications to sedation.    Past Medical History  Diagnosis Date  . HYPERLIPIDEMIA-MIXED   . Obesity, unspecified   . HYPERTENSION, UNSPECIFIED   . CAD (coronary artery disease)     a. Nonobstructive moderate CAD by cath - last 2012.  [name] Hypertrophic obstructive cardiomyopathy   . Complete heart block     a. s/p pacemaker later upgraded to CRT-D (MDT).  . CKD (chronic kidney disease), stage IV     a. Baseline Cr 1.4-1.5 prior to 09/2014-->2.3-2.8 during admission 09/2014.  [name] Chronic systolic CHF (congestive heart failure)     a. NICM (EF 25% in 2012, 20-25% in 09/2013). b. s/p BiV-ICD (MDT);  c. 10/2014 EF 25%.  . Colon polyps   . Ventricular tachycardia     a. EPS/RFCA of VT in 2012. b. On amio, mexilitene. c. Last BiV-ICD gen change 05/2013 (MDT).  . Hypothyroidism   . Fatty liver disease, nonalcoholic   . BPH (benign prostatic hyperplasia)   . Habitual alcohol use     Quit Christmas  . Liver cirrhosis   . 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 DM     insulin dependent   . Left ventricular apical thrombus     a. 10/2014 Echo: 1.5x1.5 cm apical clot-->on eliquis.  [name] NICM (nonischemic cardiomyopathy)     a. 10/2014 Echo: EF 25%, diff HK, 1.5 x 1.5 cm apical clot, mod dil LA.    Past Surgical History  Procedure Laterality Date  . Pacemaker insertion      medtronic  . Cardiac catheterization    . Facial reconstruction surgery      after trauma in 2006  . Bi-ventricular implantable cardioverter defibrillator N/A 05/25/2013    Procedure: BI-VENTRICULAR IMPLANTABLE CARDIOVERTER DEFIBRILLATOR  (CRT-D);  Surgeon: Marinus Maw, MD;  Location:  [facility] CATH LAB;  Service: Cardiovascular;  Laterality: N/A;  . Back surgery      Allergies: Ace inhibitors; Amiodarone; Nitroglycerin; Procan sr; and Quinidine  Medications: Prior to Admission medications   Medication Sig Start Date End Date Taking? Authorizing Provider  apixaban (ELIQUIS) 5 MG TABS tablet Take 1 tablet (5 mg total) by mouth 2 (two) times daily. 10/17/14  Yes Ok Anis, NP  carvedilol (COREG) 6.25 MG tablet Take 1 tablet (6.25 mg total) by mouth 2 (two) times daily with a meal. 10/11/14  Yes Jeralyn Bennett, MD  cholecalciferol (VITAMIN D) 1000 UNITS tablet Take 2,000 Units by mouth daily at 12 noon.    Yes Historical Provider, MD  fish oil-omega-3 fatty acids 1000 MG capsule Take 1 capsule by mouth 3 (three) times daily.    Yes Historical Provider, MD  furosemide (LASIX) 40 MG tablet Take 3 tablets (120 mg total) by mouth 2 (two) times daily. 01/05/15  Yes Azalee Course, PA  hydrALAZINE (APRESOLINE) 10 MG tablet Take 1 tablet (10 mg total) by mouth every 8 (eight) hours. 12/24/14  Yes Ernestina Penna, MD  insulin glargine (LANTUS) 100 UNIT/ML injection Inject 50 Units into the skin 2 (two) times daily.  07/25/14  Yes Tammy Eckard, PHARMD  insulin lispro (HUMALOG KWIKPEN) 100 UNIT/ML KiwkPen Inject 16 units with largest/evening meal.Dispense  quikpens Patient taking differently: Inject 16 Units into the skin daily with supper.  02/28/14  Yes Ernestina Penna, MD  isosorbide mononitrate (IMDUR) 30 MG 24 hr tablet Take 1 tablet (30 mg total) by mouth daily. 12/24/14  Yes Ernestina Penna, MD  levothyroxine (SYNTHROID, LEVOTHROID) 150 MCG tablet Take 150 mcg by mouth daily before breakfast. To be taken with 1/2 of a tab (37.98mcg) to equal 187.   Yes Historical Provider, MD  levothyroxine (SYNTHROID, LEVOTHROID) 75 MCG tablet Take 37.5 mcg by mouth daily before breakfast. To be taken with levo to equal 187.   Yes Historical Provider, MD  linagliptin (TRADJENTA) 5 MG TABS  tablet Take 1 tablet (5 mg total) by mouth daily. Patient taking differently: Take 5 mg by mouth daily at 12 noon.  12/24/14  Yes Ernestina Penna, MD  metolazone (ZAROXOLYN) 2.5 MG tablet Take 1 tablet (2.5 mg total) by mouth daily. Take 30 minutes prior to AM dose of Lasix. 01/10/15  Yes Rollene Rotunda, MD  mexiletine (MEXITIL) 150 MG capsule TAKE 1 CAPSULE EVERY 8 HOURS Patient taking differently: Take 150 mg by mouth 3 (three) times daily.  02/28/14  Yes Ernestina Penna, MD  potassium chloride SA (K-DUR,KLOR-CON) 20 MEQ tablet Take 2 tablets (40 mEq total) by mouth 3 (three) times daily. 01/05/15  Yes Azalee Course, PA  rosuvastatin (CRESTOR) 20 MG tablet Take 0.5 tablets (10 mg total) by mouth daily. Patient taking differently: Take 10 mg by mouth at bedtime.  10/14/14  Yes Ernestina Penna, MD  tamsulosin (FLOMAX) 0.4 MG CAPS capsule Take 1 capsule (0.4 mg total) by mouth daily. 12/24/14  Yes Ernestina Penna, MD     Family History  Problem Relation Age of Onset  . Diabetic kidney disease Father   . Coronary artery disease Neg Hx     History   Social History  . Marital Status: Married    Spouse Name: N/A  . Number of Children: 1  . Years of Education: N/A   Social History Main Topics  . Smoking status: Former Smoker    Types: Cigarettes    Quit date: 10/04/1968  . Smokeless tobacco: Never Used     Comment: 30+ yrs  . Alcohol Use: Yes     Comment: about 1 cup of liquor daily (straight liquor - not mixed)  . Drug Use: No  . Sexual Activity: Not on file   Other Topics Concern  . None   Social History Narrative   Lives at home with wife, son, daughter in law and two grands.     Review of Systems: A 12 point ROS discussed and pertinent positives are indicated in the HPI above.  All other systems are negative.  Review of Systems  Vital Signs: BP 108/33 mmHg  Pulse 69  Temp(Src) 97.5 F (36.4 C) (Oral)  Resp 18  Ht 6\' 1"  (1.854 m)  Wt 304 lb 9.6 oz (138.166 kg)  BMI 40.20 kg/m2   SpO2 95%  Physical Exam General; A&ox3, NAD Heart: RRR without M/G/R Lungs: CTA B/L  Mallampati Score:  MD Evaluation Airway: WNL Heart: WNL Abdomen: WNL Chest/ Lungs: WNL ASA  Classification: 3 Mallampati/Airway Score: Two  Imaging: Dg Chest 2 View  12/30/2014   CLINICAL DATA:  Retained fluid, swollen belly and feet.  EXAM: CHEST  2 VIEW  COMPARISON:  10/11/2014  FINDINGS: Bilateral interstitial thickening with prominence of the central pulmonary vasculature. Trace bilateral pleural effusions. No pneumothorax. Stable  cardiomegaly. There is a cardiac pacemaker present.  Osseous structures are unremarkable.  IMPRESSION: Cardiomegaly with pulmonary vascular congestion.   Electronically Signed   By: Elige Ko   On: 12/30/2014 11:59   US Abdomen Limited  01/31/2015   CLINICAL DATA:  Ascites.  History of hepatic cirrhosis  EXAM: LIMITED ABDOMINAL ULTRASOUND  COMPARISON:  October 04, 2014  FINDINGS: There is moderate diffuse ascites throughout the abdomen with ascites noted in both upper and lower quadrants. There are multiple loops of peristalsing bowel as well appear  IMPRESSION: Moderate generalized ascites.   Electronically Signed   By: Bretta Bang III M.D.   On: 01/31/15 17:53   Dg Chest Port 1 View  01-31-2015   CLINICAL DATA:  Central catheter placement  EXAM: PORTABLE CHEST - 1 VIEW  COMPARISON:  December 30, 2014  FINDINGS: Central catheter tip is in the superior vena cava. There are pacemaker leads in the right atrium and right ventricle regions. No pneumothorax. Cardiomegaly with pulmonary vascular within normal limits is stable. Lungs are clear. No adenopathy.  IMPRESSION: Central catheter tip in superior vena cava. No pneumothorax. Cardiomegaly. No change in pacemaker lead placement. No edema or consolidation.   Electronically Signed   By: Bretta Bang III M.D.   On: 2015-01-31 14:22    Labs:  CBC:  Recent Labs  12/30/14 1155 2015-01-31 1300 01/16/15 0430  01/16/15 1520 01/17/15 0330  WBC 5.7 5.0 4.9  --  5.4  HGB 10.7* 9.5* 8.2*  --  7.8*  HCT 32.9* 29.7* 25.0* 24.0* 23.4*  PLT 76* 76* 72*  --  66*    COAGS:  Recent Labs  09/05/14 0415 09/06/14 0400 01-31-2015 1300 01/16/15 1520 01/16/15 2250 01/17/15 0330 01/17/15 0900  INR 1.49 1.41 2.09*  --   --  1.54*  --   APTT  --   --   --  60* 68* 62* 70*    BMP:  Recent Labs  2015/01/31 1300 01/31/2015 2020 01/16/15 0430 01/17/15 0330  NA 134* 134* 136 132*  133*  K 6.6* 5.5* 5.4* 4.9  5.0  CL 104 103 104 101  102  CO2 GLUCOSE 68* 149* 90 128*  129*  BUN 77* 76* 76* 78*  77*  CALCIUM 9.2 8.9 8.9 8.8  8.9  CREATININE 4.05* 4.12* 4.32* 5.02*  5.02*  GFRNONAA 15* 15* 14* 11*  11*  GFRAA 17* 17* 16* 13*  13*    LIVER FUNCTION TESTS:  Recent Labs  10/07/14 0400 10/14/14 1147 11/13/14 1300 12/11/14 1220 12/19/14 0946 31-Jan-2015 1300 01/17/15 0330  BILITOT 0.4 0.5  --   --  1.2 1.2  --   AST 87* 110*  --   --  58* 58*  --   ALT 58* 75*  --   --  30 30  --   ALKPHOS 101 117  --   --  102 83  --   PROT 6.5 6.5  --   --  6.2 6.7  --   ALBUMIN 3.0*  --  2.8* 2.5*  --  2.7* 2.6*    Assessment and Plan: Acute on chronic CKD in setting of decompensated CHF Request for tunneled perm catheter placement The patient has been NPO, IV heparin stopped just prior to procedure, labs and vitals have been reviewed. Risks and Benefits discussed with the patient including, but not limited to bleeding, infection, vascular injury, pneumothorax which may require chest tube placement, air  embolism or even death All of the patient's questions were answered, patient is agreeable to proceed. Consent signed and in chart. LV apical thrombus- on IV heparin CAD Acute on chronic systolic CHF   Thank you for this interesting consult.  I greatly enjoyed meeting SHANDELL JALLOW and look forward to participating in their care.  SignedBerneta Levins 01/17/2015, 3:53  PM   I spent a total of 20 Minutes in face to face in clinical consultation, greater than 50% of which was counseling/coordinating care for ESRD.

## 2015-01-17 NOTE — Progress Notes (Addendum)
ANTICOAGULATION CONSULT NOTE - Follow Up Consult  Pharmacy Consult for Heparin  Indication: LV thrombus  Allergies  Allergen Reactions  . Ace Inhibitors     AVOID ALL  BECAUSE OF HEART DISEASE   . Amiodarone     Abnormal LFTs (10/2014)  . Nitroglycerin Other (See Comments)    Reaction unknown  . Procan Sr [Procainamide Hcl]     PT. HAS HEART DISEASE CALLED HYPERTHROPHIC CARDIOMYOPATHY  . Quinidine     PT. HAS HEART DISEASE CALLED HYPERTHROPHIC CARDIOMYOPATHY     Patient Measurements: Height: 6\' 1"  (185.4 cm) Weight: 300 lb (136.079 kg) IBW/kg (Calculated) : 79.9  Vital Signs: Temp: 97.4 F (36.3 C) (04/14 2004) Temp Source: Oral (04/14 2004) BP: 114/40 mmHg (04/14 2247)  Labs:  Recent Labs  01/26/2015 1300 01/18/2015 2020 01/16/15 0430 01/16/15 0843 01/16/15 1520 01/16/15 2250  HGB 9.5*  --  8.2*  --   --   --   HCT 29.7*  --  25.0*  --   --   --   PLT 76*  --  72*  --   --   --   APTT  --   --   --   --  60* 68*  LABPROT 23.7*  --   --   --   --   --   INR 2.09*  --   --   --   --   --   HEPARINUNFRC  --   --   --  0.96*  --   --   CREATININE 4.05* 4.12* 4.32*  --   --   --   TROPONINI 0.40*  --   --   --   --   --     Estimated Creatinine Clearance: 26.7 mL/min (by C-G formula based on Cr of 4.32). Assessment: Therapeutic aPTT after rate increase (using aPTT to dose given apixaban influence on heparin level).   Goal of Therapy:  Heparin level 0.3-0.7 units/ml aPTT 66-102 seconds Monitor platelets by anticoagulation protocol: Yes   Plan:  -Continue heparin at 1650 units/hr -aPTT with AM labs -Daily CBC/HL/aPTT -Monitor for bleeding  Abran Duke 01/17/2015,12:19 AM  ============================== Addendum 4:37 AM  APTT slight low at 62 this AM -Increase heparin drip to 1850 units/hr -1230 aPTT Wilmer Floor, PharmD

## 2015-01-17 NOTE — Progress Notes (Signed)
ANTICOAGULATION CONSULT NOTE - Follow Up Consult  Pharmacy Consult for  Heparin  Indication: LV apical thrombus  Allergies  Allergen Reactions  . Ace Inhibitors     AVOID ALL  BECAUSE OF HEART DISEASE   . Amiodarone     Abnormal LFTs (10/2014)  . Nitroglycerin Other (See Comments)    Reaction unknown  . Procan Sr [Procainamide Hcl]     PT. HAS HEART DISEASE CALLED HYPERTHROPHIC CARDIOMYOPATHY  . Quinidine     PT. HAS HEART DISEASE CALLED HYPERTHROPHIC CARDIOMYOPATHY      Patient Measurements: Height:  (185.4 cm) Weight: (!) 304 lb 9.6 oz (138.166 kg) IBW/kg (Calculated) : 79.9 Heparin Dosing Weight: 114 kg  Vital Signs: Temp: 97.5 F (36.4 C) (04/15 1200) Temp Source: Oral (04/15 1200) BP: 108/33 mmHg (04/15 1200) Pulse Rate: 69 (04/15 1200)  Labs:  Recent Labs  January 21, 2015 1300 01/21/15 2020 01/16/15 0430 01/16/15 0843  01/16/15 1520 01/16/15 2250 01/17/15 0330 01/17/15 0900  HGB 9.5*  --  8.2*  --   --   --   --  7.8*  --   HCT 29.7*  --  25.0*  --   --  24.0*  --  23.4*  --   PLT 76*  --  72*  --   --   --   --  66*  --   APTT  --   --   --   --   < > 60* 68* 62* 70*  LABPROT 23.7*  --   --   --   --   --   --  18.6*  --   INR 2.09*  --   --   --   --   --   --  1.54*  --   HEPARINUNFRC  --   --   --  0.96*  --   --   --  0.80*  --   CREATININE 4.05* 4.12* 4.32*  --   --   --   --  5.02*  5.02*  --   TROPONINI 0.40*  --   --   --   --   --   --   --   --   < > = values in this interval not displayed.  Estimated Creatinine Clearance: 23.1 mL/min (by C-G formula based on Cr of 5.02).   Medical History: Past Medical History  Diagnosis Date  . HYPERLIPIDEMIA-MIXED   . Obesity, unspecified   . HYPERTENSION, UNSPECIFIED   . CAD (coronary artery disease)     a. Nonobstructive moderate CAD by cath - last 2012.  [name] Hypertrophic obstructive cardiomyopathy   . Complete heart block     a. s/p pacemaker later upgraded to CRT-D (MDT).  . CKD (chronic kidney  disease), stage IV     a. Baseline Cr 1.4-1.5 prior to 09/2014-->2.3-2.8 during admission 09/2014.  [name] Chronic systolic CHF (congestive heart failure)     a. NICM (EF 25% in 2012, 20-25% in 09/2013). b. s/p BiV-ICD (MDT);  c. 10/2014 EF 25%.  . Colon polyps   . Ventricular tachycardia     a. EPS/RFCA of VT in 2012. b. On amio, mexilitene. c. Last BiV-ICD gen change 05/2013 (MDT).  . Hypothyroidism   . Fatty liver disease, nonalcoholic   . BPH (benign prostatic hyperplasia)   . Habitual alcohol use     Quit Christmas  . Liver cirrhosis   . 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  DM     insulin dependent   . Left ventricular apical thrombus     a. 10/2014 Echo: 1.5x1.5 cm apical clot-->on eliquis.  [name] NICM (nonischemic cardiomyopathy)     a. 10/2014 Echo: EF 25%, diff HK, 1.5 x 1.5 cm apical clot, mod dil LA.      Assessment: 59yom admitted with HF exacerbation and started on milrinone.  He was on apixaban pta for LV apical thrombus.  He is CKD IV and probable soon HD candidate.  Apixaban is now on hold with last dose 4/13 am.   HL elevated d/t  apixaban affects HL with false elevation.   aPTT will be used to direct heparin dosing until apixaban is cleared.  Heparin drip 1850 uts/hr aPTT 70 sec at goal.  CBC stable, no bleeding noted.    Goal of Therapy:  Heparin level 0.3-0.7 units/ml aPTT 60-100  seconds Monitor platelets by anticoagulation protocol: Yes   Plan:  Heparin drip 1850 uts/hr Daily HL, aPTT, CBC  Leota Sauers Pharm.D. CPP, BCPS Clinical Pharmacist [phone] 01/17/2015 3:50 PM

## 2015-01-18 DIAGNOSIS — I442 Atrioventricular block, complete: Secondary | ICD-10-CM

## 2015-01-18 DIAGNOSIS — I429 Cardiomyopathy, unspecified: Secondary | ICD-10-CM

## 2015-01-18 DIAGNOSIS — I213 ST elevation (STEMI) myocardial infarction of unspecified site: Secondary | ICD-10-CM

## 2015-01-18 DIAGNOSIS — Z95810 Presence of automatic (implantable) cardiac defibrillator: Secondary | ICD-10-CM

## 2015-01-18 LAB — BASIC METABOLIC PANEL
ANION GAP: 13 (ref 5–15)
BUN: 80 mg/dL — ABNORMAL HIGH (ref 6–23)
CO2: 19 mmol/L (ref 19–32)
Calcium: 8.8 mg/dL (ref 8.4–10.5)
Chloride: 98 mmol/L (ref 96–112)
Creatinine, Ser: 5.77 mg/dL — ABNORMAL HIGH (ref 0.50–1.35)
GFR calc Af Amer: 11 mL/min — ABNORMAL LOW (ref >90)
GFR calc non Af Amer: 10 mL/min — ABNORMAL LOW (ref >90)
Glucose, Bld: 93 mg/dL (ref 70–99)
Potassium: 5 mmol/L (ref 3.5–5.1)
Sodium: 130 mmol/L — ABNORMAL LOW (ref 135–145)

## 2015-01-18 LAB — HEPARIN LEVEL (UNFRACTIONATED): Heparin Unfractionated: 0.24 IU/mL — ABNORMAL LOW (ref 0.30–0.70)

## 2015-01-18 LAB — CBC
HCT: 23.5 % — ABNORMAL LOW (ref 39.0–52.0)
Hemoglobin: 7.8 g/dL — ABNORMAL LOW (ref 13.0–17.0)
MCH: 29 pg (ref 26.0–34.0)
MCHC: 33.2 g/dL (ref 30.0–36.0)
MCV: 87.4 fL (ref 78.0–100.0)
Platelets: 75 10*3/uL — ABNORMAL LOW (ref 150–400)
RBC: 2.69 MIL/uL — AB (ref 4.22–5.81)
RDW: 15.5 % (ref 11.5–15.5)
WBC: 6.3 10*3/uL (ref 4.0–10.5)

## 2015-01-18 LAB — GLUCOSE, CAPILLARY
GLUCOSE-CAPILLARY: 133 mg/dL — AB (ref 70–99)
GLUCOSE-CAPILLARY: 70 mg/dL (ref 70–99)
GLUCOSE-CAPILLARY: 124 mg/dL — AB (ref 70–99)
Glucose-Capillary: 112 mg/dL — ABNORMAL HIGH (ref 70–99)
Glucose-Capillary: 81 mg/dL (ref 70–99)
Glucose-Capillary: 146 mg/dL — ABNORMAL HIGH (ref 70–99)

## 2015-01-18 LAB — PROTIME-INR
INR: 1.45 (ref 0.00–1.49)
Prothrombin Time: 17.8 seconds — ABNORMAL HIGH (ref 11.6–15.2)

## 2015-01-18 LAB — APTT
APTT: 36 s (ref 24–37)
APTT: 67 s — AB (ref 24–37)

## 2015-01-18 LAB — CARBOXYHEMOGLOBIN
Carboxyhemoglobin: 2.2 % — ABNORMAL HIGH (ref 0.5–1.5)
Methemoglobin: 1 % (ref 0.0–1.5)
O2 SAT: 77.7 %
Total hemoglobin: 7.5 g/dL — ABNORMAL LOW (ref 13.5–18.0)

## 2015-01-18 MED ORDER — HEPARIN SODIUM (PORCINE) 1000 UNIT/ML DIALYSIS: 1000.0000 [IU] | INTRAMUSCULAR | Status: DC | PRN

## 2015-01-18 MED ORDER — ALTEPLASE 2 MG IJ SOLR
2.0000 mg | Freq: Once | INTRAMUSCULAR | Status: DC | PRN
  Filled 2015-01-18: qty 2

## 2015-01-18 MED ORDER — PATIENT'S GUIDE TO USING COUMADIN BOOK
Freq: Once | Status: AC
  Administered 2015-01-18: 15:00:00
  Filled 2015-01-18: qty 1

## 2015-01-18 MED ORDER — WARFARIN - PHARMACIST DOSING INPATIENT
Freq: Every day | Status: DC
  Administered 2015-01-18 – 2015-01-19 (×2)

## 2015-01-18 MED ORDER — LIDOCAINE-PRILOCAINE 2.5-2.5 % EX CREA
1.0000 "application " | TOPICAL_CREAM | CUTANEOUS | Status: DC | PRN
  Filled 2015-01-18: qty 5

## 2015-01-18 MED ORDER — WARFARIN SODIUM 7.5 MG PO TABS
7.5000 mg | ORAL_TABLET | Freq: Once | ORAL | Status: AC
  Administered 2015-01-18: 7.5 mg via ORAL
  Filled 2015-01-18 (×2): qty 1

## 2015-01-18 MED ORDER — SODIUM CHLORIDE 0.9 % IV SOLN: 100.0000 mL | INTRAVENOUS | Status: DC | PRN

## 2015-01-18 MED ORDER — NEPRO/CARBSTEADY PO LIQD
237.0000 mL | ORAL | Status: DC | PRN
  Filled 2015-01-18: qty 237

## 2015-01-18 MED ORDER — HEPARIN SODIUM (PORCINE) 1000 UNIT/ML DIALYSIS: 20.0000 [IU]/kg | INTRAMUSCULAR | Status: DC | PRN

## 2015-01-18 MED ORDER — LIDOCAINE HCL (PF) 1 % IJ SOLN: 5.0000 mL | INTRAMUSCULAR | Status: DC | PRN

## 2015-01-18 MED ORDER — WARFARIN VIDEO: Freq: Once | Status: DC

## 2015-01-18 MED ORDER — PENTAFLUOROPROP-TETRAFLUOROETH EX AERO: 1.0000 "application " | INHALATION_SPRAY | CUTANEOUS | Status: DC | PRN

## 2015-01-18 NOTE — Progress Notes (Signed)
ANTICOAGULATION CONSULT NOTE - Follow Up Consult  Pharmacy Consult for Heparin  Indication: LV thrombus  Allergies  Allergen Reactions  . Ace Inhibitors     AVOID ALL  BECAUSE OF HEART DISEASE   . Amiodarone     Abnormal LFTs (10/2014)  . Nitroglycerin Other (See Comments)    Reaction unknown  . Procan Sr [Procainamide Hcl]     PT. HAS HEART DISEASE CALLED HYPERTHROPHIC CARDIOMYOPATHY  . Quinidine     PT. HAS HEART DISEASE CALLED HYPERTHROPHIC CARDIOMYOPATHY     Patient Measurements: Height: 6\' 1"  (185.4 cm) Weight: (!) 309 lb 1.4 oz (140.2 kg) IBW/kg (Calculated) : 79.9  Vital Signs: Temp: 98.2 F (36.8 C) (04/16 0400) Temp Source: Oral (04/16 0400) BP: 101/37 mmHg (04/16 0400)  Labs:  Recent Labs  01/07/2015 1300  01/16/15 0430 01/16/15 0843 01/16/15 1520  01/17/15 0330 01/17/15 0900 01/18/15 0345  HGB 9.5*  --  8.2*  --   --   --  7.8*  --  7.8*  HCT 29.7*  --  25.0*  --  24.0*  --  23.4*  --  23.5*  PLT 76*  --  72*  --   --   --  66*  --  75*  APTT  --   --   --   --  60*  < > 62* 70* 36  LABPROT 23.7*  --   --   --   --   --  18.6*  --  17.8*  INR 2.09*  --   --   --   --   --  1.54*  --  1.45  HEPARINUNFRC  --   --   --  0.96*  --   --  0.80*  --  0.24*  CREATININE 4.05*  < > 4.32*  --   --   --  5.02*  5.02*  --  5.77*  TROPONINI 0.40*  --   --   --   --   --   --   --   --   < > = values in this interval not displayed.  Estimated Creatinine Clearance: 20.3 mL/min (by C-G formula based on Cr of 5.77). Assessment: Sub-therapeutic aPTT of 36 is inaccurate as heparin was OFF for ~2 hours before lab draw (using aPTT given apixaban PTA, can likely start using HL to dose on 4/17).   Goal of Therapy:  Heparin level 0.3-0.7 units/ml aPTT 66-102 seconds Monitor platelets by anticoagulation protocol: Yes   Plan:  -Continue heparin at 1850 units/hr given off time this AM -aPTT at 1200 -Daily CBC/HL/aPTT -Monitor for bleeding  Abran Duke 01/18/2015,5:23 AM

## 2015-01-18 NOTE — Progress Notes (Signed)
Patient Name: [name] Date of Encounter: 01/18/2015  Active Problems:   Acute on chronic systolic CHF (congestive heart failure)   Dialysis patient   Length of Stay: 3  SUBJECTIVE  Developed hypotension towards the end of HD session and nausea and vomiting when he came back to his room. Better now.  CURRENT MEDS . carvedilol  3.125 mg Oral BID WC  . furosemide  160 mg Intravenous Q6H  . insulin aspart  0-15 Units Subcutaneous TID WC  . insulin aspart  0-5 Units Subcutaneous QHS  . insulin aspart  16 Units Subcutaneous Q supper  . insulin glargine  50 Units Subcutaneous BID  . levothyroxine  150 mcg Oral QAC breakfast  . linagliptin  5 mg Oral Daily  . mexiletine  150 mg Oral TID  . rosuvastatin  10 mg Oral Daily  . sodium chloride  3 mL Intravenous Q12H  . tamsulosin  0.4 mg Oral Daily    OBJECTIVE   Intake/Output Summary (Last 24 hours) at 01/18/15 1331 Last data filed at 01/18/15 1159  Gross per 24 hour  Intake  656.7 ml  Output    900 ml  Net -243.3 ml   Filed Weights   01/18/15 0357 01/18/15 0800 01/18/15 1159  Weight: 309 lb 1.4 oz (140.2 kg) 315 lb 0.6 oz (142.9 kg) 313 lb 11.4 oz (142.3 kg)    PHYSICAL EXAM Filed Vitals:   01/18/15 1030 01/18/15 1100 01/18/15 1130 01/18/15 1159  BP: 80/45 109/53 88/44 105/74  Pulse: 72 72 76 75  Temp:    96.6 F (35.9 C)  TempSrc:    Oral  Resp: $Remo'14 20 17 24  'yZxHp$ Height:      Weight:    313 lb 11.4 oz (142.3 kg)  SpO2:    100%   General: Alert, oriented x3, no distress Head: no evidence of trauma, PERRL, EOMI, no exophtalmos or lid lag, no myxedema, no xanthelasma; normal ears, nose and oropharynx Neck: marked elevation in left jugular venous pulsations, right IJ dialysis line; brisk carotid pulses without delay and no carotid bruits Chest: clear to auscultation, no signs of consolidation by percussion or palpation, normal fremitus, symmetrical and full respiratory excursions Cardiovascular: displaced, effaced  apical impulse, regular rhythm, normal first and split second heart sounds, no rub, has a summation gallop, no murmur Abdomen: no tenderness, has distention due to ascites, no masses by palpation, no abnormal pulsatility or arterial bruits, normal bowel sounds, no hepatosplenomegaly Extremities: no clubbing, cyanosis, 3+ deep pitting edema to thighs; 2+ radial, ulnar and brachial pulses bilaterally; unable to locate posterior tibial and dorsalis pedis pulses; no subclavian or femoral bruits Neurological: grossly nonfocal  LABS  CBC  Recent Labs  01/17/15 0330 01/18/15 0345  WBC 5.4 6.3  HGB 7.8* 7.8*  HCT 23.4* 23.5*  MCV 87.3 87.4  PLT 66* 75*   Basic Metabolic Panel  Recent Labs  01/31/2015 1520  01/17/15 0330 01/18/15 0345  NA  --   < > 132*  133* 130*  K  --   < > 4.9  5.0 5.0  CL  --   < > 101  102 98  CO2  --   < > $R'19  20 19  'Yo$ GLUCOSE  --   < > 128*  129* 93  BUN  --   < > 78*  77* 80*  CREATININE  --   < > 5.02*  5.02* 5.77*  CALCIUM  --   < > 8.8  8.9 8.8  MG 2.4  --   --   --   PHOS  --   --  5.4*  --   < > = values in this interval not displayed. Liver Function Tests  Recent Labs  01/17/15 0330  ALBUMIN 2.6*   Thyroid Function Tests  Recent Labs  01/22/2015 1520 01/28/2015 2020  TSH 0.009*  --   T3FREE  --  5.6*    Radiology Studies Imaging results have been reviewed and Ir Fluoro Guide Cv Line Right  01/17/2015   INDICATION: 59 year old male with acute on chronic kidney disease. He has been referred for hemodialysis catheter placement.  EXAM: TUNNELED CENTRAL VENOUS HEMODIALYSIS CATHETER PLACEMENT WITH ULTRASOUND AND FLUOROSCOPIC GUIDANCE  MEDICATIONS: Ancef 2 gm IV; The IV antibiotic was given in an appropriate time interval prior to skin puncture.  CONTRAST:  None  ANESTHESIA/SEDATION: Versed 1.0 mg IV; Fentanyl 50 mcg IV  Total Moderate Sedation Time  Twenty-one minutes.  FLUOROSCOPY TIME:  Zero minutes 54 seconds.  COMPLICATIONS: None   PROCEDURE: Informed written consent was obtained from the patient after a discussion of the risks, benefits, and alternatives to treatment. Questions regarding the procedure were encouraged and answered. The right neck and chest were prepped with chlorhexidine in a sterile fashion, and a sterile drape was applied covering the operative field. Maximum barrier sterile technique with sterile gowns and gloves were used for the procedure. A timeout was performed prior to the initiation of the procedure.  After creating a small venotomy incision, a micropuncture kit was utilized to access the right internal jugular vein under direct, real-time ultrasound guidance after the overlying soft tissues were anesthetized with 1% lidocaine with epinephrine. Ultrasound image documentation was performed. The microwire was kinked to measure appropriate catheter length. A stiff glidewire was advanced to the level of the IVC and the micropuncture sheath was exchanged for a peel-away sheath. A split tear tunneled hemodialysis catheter measuring 19 cm from tip to cuff was tunneled in a retrograde fashion from the anterior chest wall to the venotomy incision.  The catheter was then placed through the peel-away sheath with tips ultimately positioned within the superior aspect of the right atrium. Final catheter positioning was confirmed and documented with a spot radiographic image. The catheter aspirates and flushes normally. The catheter was flushed with appropriate volume heparin dwells.  The catheter exit site was secured with a 0-Prolene retention suture. The venotomy incision was closed with Dermabond and Steri-strips. Dressings were applied. The patient tolerated the procedure well without immediate post procedural complication.  IMPRESSION: Status post placement of right IJ approach tunneled hemodialysis catheter, split tip measuring 19 cm tip to cuff. Catheter ready for use.  Signed,  Dulcy Fanny. Earleen Newport, DO  [facility]  [facility]   Electronically Signed   By: Corrie Mckusick D.O.   On: 01/17/2015 17:54   Ir US Guide Vasc Access Right  01/17/2015   INDICATION: 59 year old male with acute on chronic kidney disease. He has been referred for hemodialysis catheter placement.  EXAM: TUNNELED CENTRAL VENOUS HEMODIALYSIS CATHETER PLACEMENT WITH ULTRASOUND AND FLUOROSCOPIC GUIDANCE  MEDICATIONS: Ancef 2 gm IV; The IV antibiotic was given in an appropriate time interval prior to skin puncture.  CONTRAST:  None  ANESTHESIA/SEDATION: Versed 1.0 mg IV; Fentanyl 50 mcg IV  Total Moderate Sedation Time  Twenty-one minutes.  FLUOROSCOPY TIME:  Zero minutes 54 seconds.  COMPLICATIONS: None  PROCEDURE: Informed written consent was obtained from the patient  after a discussion of the risks, benefits, and alternatives to treatment. Questions regarding the procedure were encouraged and answered. The right neck and chest were prepped with chlorhexidine in a sterile fashion, and a sterile drape was applied covering the operative field. Maximum barrier sterile technique with sterile gowns and gloves were used for the procedure. A timeout was performed prior to the initiation of the procedure.  After creating a small venotomy incision, a micropuncture kit was utilized to access the right internal jugular vein under direct, real-time ultrasound guidance after the overlying soft tissues were anesthetized with 1% lidocaine with epinephrine. Ultrasound image documentation was performed. The microwire was kinked to measure appropriate catheter length. A stiff glidewire was advanced to the level of the IVC and the micropuncture sheath was exchanged for a peel-away sheath. A split tear tunneled hemodialysis catheter measuring 19 cm from tip to cuff was tunneled in a retrograde fashion from the anterior chest wall to the venotomy incision.  The catheter was then placed through the peel-away sheath with tips  ultimately positioned within the superior aspect of the right atrium. Final catheter positioning was confirmed and documented with a spot radiographic image. The catheter aspirates and flushes normally. The catheter was flushed with appropriate volume heparin dwells.  The catheter exit site was secured with a 0-Prolene retention suture. The venotomy incision was closed with Dermabond and Steri-strips. Dressings were applied. The patient tolerated the procedure well without immediate post procedural complication.  IMPRESSION: Status post placement of right IJ approach tunneled hemodialysis catheter, split tip measuring 19 cm tip to cuff. Catheter ready for use.  Signed,  Dulcy Fanny. Earleen Newport, DO  [facility]  [facility]   Electronically Signed   By: Corrie Mckusick D.O.   On: 01/17/2015 17:54    TELE Sinus rhythm, BiV paced  ASSESSMENT AND PLAN  59 yo with history of nonobstructive CAD, nonischemic CMP with chronic systolic CHF, CKD IV, and history of LV apical thrombus presents with CHF. 1. Acute on chronic systolic CHF: NYHA class IV symptoms with marked volume overload. Low output failure with cardiorenal syndrome. Echo showed EF 30-35% with mobile apical thrombus (known from past). Co-ox improved to 66% with milrinone, feels better but poor UOP.   - Continue milrinone 0.25 mcg/kg/min for now. - Minimal effect from maximal dose diuretics. I think he will need HD vs CVVH for fluid removal. Nephrology following.  - Continue Coreg 3.125 mg bid. BP on the soft side, stop hydralazine/Imdur now especially as we are contemplating HD.  - Dialysis for hypervolemia 2. CKD IV: Suspect cardiorenal syndrome. Poor diuresis with maximal regimen and creatinine rising. He will need at least short-term HD to remove fluid (massively overloaded) 3. H/o CHB: Has [employer] CRT-D device 4. Hyperkalemia: Improved with treatment.  5. H/o VT: Continue Mexiletine.  6.  LV apical thrombus: Currently he is on heparin gtt for HD catheter placement. Will initiate warfarin.  7. Anemia: No active bleeding. Send hemoccult. May be anemia of renal disease.    Sanda Klein, MD, [facility] [phone] office [phone] pager 01/18/2015 1:31 PM

## 2015-01-18 NOTE — Progress Notes (Signed)
ANTICOAGULATION CONSULT NOTE - Follow Up Consult  Pharmacy Consult for  Heparin/add Coumadin  Indication: LV apical thrombus  Allergies  Allergen Reactions  . Ace Inhibitors     AVOID ALL  BECAUSE OF HEART DISEASE   . Amiodarone     Abnormal LFTs (10/2014)  . Nitroglycerin Other (See Comments)    Reaction unknown  . Procan Sr [Procainamide Hcl]     PT. HAS HEART DISEASE CALLED HYPERTHROPHIC CARDIOMYOPATHY  . Quinidine     PT. HAS HEART DISEASE CALLED HYPERTHROPHIC CARDIOMYOPATHY      Patient Measurements: Height: 6\' 1"  (185.4 cm) Weight: (!) 313 lb 11.4 oz (142.3 kg) IBW/kg (Calculated) : 79.9 Heparin Dosing Weight: 114 kg  Vital Signs: Temp: 96.6 F (35.9 C) (04/16 1159) Temp Source: Oral (04/16 1159) BP: 105/74 mmHg (04/16 1159) Pulse Rate: 75 (04/16 1159)  Labs:  Recent Labs  01/16/15 0430 01/16/15 0843 01/16/15 1520  01/17/15 0330 01/17/15 0900 01/18/15 0345 01/18/15 1447  HGB 8.2*  --   --   --  7.8*  --  7.8*  --   HCT 25.0*  --  24.0*  --  23.4*  --  23.5*  --   PLT 72*  --   --   --  66*  --  75*  --   APTT  --   --  60*  < > 62* 70* 36 67*  LABPROT  --   --   --   --  18.6*  --  17.8*  --   INR  --   --   --   --  1.54*  --  1.45  --   HEPARINUNFRC  --  0.96*  --   --  0.80*  --  0.24*  --   CREATININE 4.32*  --   --   --  5.02*  5.02*  --  5.77*  --   < > = values in this interval not displayed.  Estimated Creatinine Clearance: 20.5 mL/min (by C-G formula based on Cr of 5.77).   Medical History: Past Medical History  Diagnosis Date  . HYPERLIPIDEMIA-MIXED   . Obesity, unspecified   . HYPERTENSION, UNSPECIFIED   . CAD (coronary artery disease)     a. Nonobstructive moderate CAD by cath - last 2012.  [name] Hypertrophic obstructive cardiomyopathy   . Complete heart block     a. s/p pacemaker later upgraded to CRT-D (MDT).  . CKD (chronic kidney disease), stage IV     a. Baseline Cr 1.4-1.5 prior to 09/2014-->2.3-2.8 during admission 09/2014.  [name]  Chronic systolic CHF (congestive heart failure)     a. NICM (EF 25% in 2012, 20-25% in 09/2013). b. s/p BiV-ICD (MDT);  c. 10/2014 EF 25%.  . Colon polyps   . Ventricular tachycardia     a. EPS/RFCA of VT in 2012. b. On amio, mexilitene. c. Last BiV-ICD gen change 05/2013 (MDT).  . Hypothyroidism   . Fatty liver disease, nonalcoholic   . BPH (benign prostatic hyperplasia)   . Habitual alcohol use     Quit Christmas  . Liver cirrhosis   . 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 DM     insulin dependent   . Left ventricular apical thrombus     a. 10/2014 Echo: 1.5x1.5 cm apical clot-->on eliquis.  [name] NICM (nonischemic cardiomyopathy)     a. 10/2014 Echo: EF 25%, diff HK, 1.5 x 1.5 cm apical clot, mod dil LA.  Assessment: 59yom admitted with HF exacerbation and started on milrinone.  He was on apixaban pta for LV apical thrombus.  He is CKD IV and probable soon HD candidate.  Apixaban is now on hold with last dose 4/13 am.   HL elevated d/t  apixaban affects HL with false elevation.   aPTT will be used to direct heparin dosing until apixaban is cleared.  Heparin drip 1850 uts/hr aPTT 70 sec at goal.  CBC stable, no bleeding noted.  PTT at goal at 0.67.  Also asked to begin Coumadin tonight since apixaban not preferred due to worsening renal function.  Goal of Therapy:  Heparin level 0.3-0.7 units/ml aPTT 60-100  seconds Monitor platelets by anticoagulation protocol: Yes   Plan:  Continue Heparin drip at 1850 units/hr. Coumadin 7.5 mg po x 1 tonight. Begin Coumadin education. Daily HL, aPTT, CBC, INR  Tad Moore, BCPS  Clinical Pharmacist Pager [phone]  01/18/2015 3:06 PM

## 2015-01-18 NOTE — Progress Notes (Signed)
Pharmacist Heart Failure Core Measure Documentation  Assessment: [name] has an EF documented as 30-35% on 01/16/15 by ECHO.  Rationale: Heart failure patients with left ventricular systolic dysfunction (LVSD) and an EF < 40% should be prescribed an angiotensin converting enzyme inhibitor (ACEI) or angiotensin receptor blocker (ARB) at discharge unless a contraindication is documented in the medical record.  This patient is not currently on an ACEI or ARB for HF.  This note is being placed in the record in order to provide documentation that a contraindication to the use of these agents is present for this encounter.  ACE Inhibitor or Angiotensin Receptor Blocker is contraindicated (specify all that apply)  []   ACEI allergy AND ARB allergy []   Angioedema []   Moderate or severe aortic stenosis []   Hyperkalemia []   Hypotension []   Renal artery stenosis [x]   Worsening renal function, preexisting renal disease or dysfunction   Tad Moore, BCPS  Clinical Pharmacist Pager [phone]  01/18/2015 3:10 PM

## 2015-01-18 NOTE — Procedures (Signed)
First HD treatment today after IJ permcath placed.  Unstable/low BP without much ultrafiltration.  Hopefully will not need to switch to CRRT. Fransheska Willingham C

## 2015-01-19 DIAGNOSIS — R570 Cardiogenic shock: Secondary | ICD-10-CM

## 2015-01-19 DIAGNOSIS — N189 Chronic kidney disease, unspecified: Secondary | ICD-10-CM

## 2015-01-19 LAB — CBC
HEMATOCRIT: 23.3 % — AB (ref 39.0–52.0)
HEMOGLOBIN: 7.6 g/dL — AB (ref 13.0–17.0)
MCH: 29.1 pg (ref 26.0–34.0)
MCHC: 32.6 g/dL (ref 30.0–36.0)
MCV: 89.3 fL (ref 78.0–100.0)
PLATELETS: 73 10*3/uL — AB (ref 150–400)
RBC: 2.61 MIL/uL — ABNORMAL LOW (ref 4.22–5.81)
RDW: 15.9 % — ABNORMAL HIGH (ref 11.5–15.5)
WBC: 6 10*3/uL (ref 4.0–10.5)

## 2015-01-19 LAB — RENAL FUNCTION PANEL
ALBUMIN: 2.6 g/dL — AB (ref 3.5–5.2)
ANION GAP: 11 (ref 5–15)
BUN: 48 mg/dL — ABNORMAL HIGH (ref 6–23)
CO2: 22 mmol/L (ref 19–32)
Calcium: 8.6 mg/dL (ref 8.4–10.5)
Chloride: 97 mmol/L (ref 96–112)
Creatinine, Ser: 5.11 mg/dL — ABNORMAL HIGH (ref 0.50–1.35)
GFR calc Af Amer: 13 mL/min — ABNORMAL LOW (ref >90)
GFR, EST NON AFRICAN AMERICAN: 11 mL/min — AB (ref >90)
GLUCOSE: 121 mg/dL — AB (ref 70–99)
POTASSIUM: 4.1 mmol/L (ref 3.5–5.1)
Phosphorus: 5.1 mg/dL — ABNORMAL HIGH (ref 2.3–4.6)
Sodium: 130 mmol/L — ABNORMAL LOW (ref 135–145)

## 2015-01-19 LAB — GLUCOSE, CAPILLARY
GLUCOSE-CAPILLARY: 115 mg/dL — AB (ref 70–99)
GLUCOSE-CAPILLARY: 85 mg/dL (ref 70–99)
Glucose-Capillary: 135 mg/dL — ABNORMAL HIGH (ref 70–99)
Glucose-Capillary: 130 mg/dL — ABNORMAL HIGH (ref 70–99)

## 2015-01-19 LAB — POCT ACTIVATED CLOTTING TIME: ACTIVATED CLOTTING TIME: 140 s

## 2015-01-19 LAB — PROTIME-INR
INR: 1.48 (ref 0.00–1.49)
PROTHROMBIN TIME: 18 s — AB (ref 11.6–15.2)

## 2015-01-19 LAB — APTT: aPTT: 71 seconds — ABNORMAL HIGH (ref 24–37)

## 2015-01-19 LAB — HEPARIN LEVEL (UNFRACTIONATED): Heparin Unfractionated: 0.4 IU/mL (ref 0.30–0.70)

## 2015-01-19 MED ORDER — HEPARIN (PORCINE) 2000 UNITS/L FOR CRRT
INTRAVENOUS_CENTRAL | Status: DC | PRN
  Administered 2015-01-21: 1000 mL via INTRAVENOUS_CENTRAL
  Filled 2015-01-19 (×2): qty 1000

## 2015-01-19 MED ORDER — PRISMASOL BGK 4/2.5 32-4-2.5 MEQ/L IV SOLN
INTRAVENOUS | Status: DC
  Administered 2015-01-19 – 2015-01-21 (×4): via INTRAVENOUS_CENTRAL
  Filled 2015-01-19 (×4): qty 5000

## 2015-01-19 MED ORDER — PRISMASOL BGK 4/2.5 32-4-2.5 MEQ/L IV SOLN
INTRAVENOUS | Status: DC
  Administered 2015-01-19 – 2015-01-22 (×3): via INTRAVENOUS_CENTRAL
  Filled 2015-01-19 (×4): qty 5000

## 2015-01-19 MED ORDER — ALPRAZOLAM 0.5 MG PO TABS
0.5000 mg | ORAL_TABLET | Freq: Three times a day (TID) | ORAL | Status: DC | PRN
  Administered 2015-01-19 – 2015-01-20 (×2): 0.5 mg via ORAL
  Filled 2015-01-19 (×2): qty 1

## 2015-01-19 MED ORDER — HEPARIN SODIUM (PORCINE) 1000 UNIT/ML DIALYSIS
1000.0000 [IU] | INTRAMUSCULAR | Status: DC | PRN
  Filled 2015-01-19: qty 6
  Filled 2015-01-19: qty 4

## 2015-01-19 MED ORDER — PRISMASOL BGK 4/2.5 32-4-2.5 MEQ/L IV SOLN
INTRAVENOUS | Status: DC
  Administered 2015-01-19 – 2015-01-22 (×13): via INTRAVENOUS_CENTRAL
  Filled 2015-01-19 (×18): qty 5000

## 2015-01-19 MED ORDER — SODIUM CHLORIDE 0.9 % IV SOLN
510.0000 mg | Freq: Once | INTRAVENOUS | Status: AC
  Administered 2015-01-19: 510 mg via INTRAVENOUS
  Filled 2015-01-19 (×2): qty 17

## 2015-01-19 MED ORDER — DARBEPOETIN ALFA 100 MCG/0.5ML IJ SOSY
100.0000 ug | PREFILLED_SYRINGE | INTRAMUSCULAR | Status: DC
  Administered 2015-01-20: 100 ug via INTRAVENOUS
  Filled 2015-01-19 (×2): qty 0.5

## 2015-01-19 MED ORDER — NOREPINEPHRINE BITARTRATE 1 MG/ML IV SOLN
2.5000 ug/min | INTRAVENOUS | Status: DC
  Administered 2015-01-19: 2.5 ug/min via INTRAVENOUS
  Administered 2015-01-20: 5 ug/min via INTRAVENOUS
  Filled 2015-01-19 (×3): qty 4

## 2015-01-19 MED ORDER — WARFARIN SODIUM 7.5 MG PO TABS
7.5000 mg | ORAL_TABLET | Freq: Once | ORAL | Status: AC
  Administered 2015-01-19: 7.5 mg via ORAL
  Filled 2015-01-19: qty 1

## 2015-01-19 NOTE — Progress Notes (Signed)
ANTICOAGULATION CONSULT NOTE - Follow Up Consult  Pharmacy Consult for  Heparin/Coumadin  Indication: LV apical thrombus  Allergies  Allergen Reactions  . Ace Inhibitors     AVOID ALL  BECAUSE OF HEART DISEASE   . Amiodarone     Abnormal LFTs (10/2014)  . Nitroglycerin Other (See Comments)    Reaction unknown  . Procan Sr [Procainamide Hcl]     PT. HAS HEART DISEASE CALLED HYPERTHROPHIC CARDIOMYOPATHY  . Quinidine     PT. HAS HEART DISEASE CALLED HYPERTHROPHIC CARDIOMYOPATHY      Patient Measurements: Height:  (185.4 cm) Weight: (!) 312 lb 9.8 oz (141.8 kg) IBW/kg (Calculated) : 79.9 Heparin Dosing Weight: 114 kg  Vital Signs: Temp: 97.6 F (36.4 C) (04/17 0357) Temp Source: Oral (04/17 0357) BP: 81/34 mmHg (04/17 0700) Pulse Rate: 72 (04/16 2000)  Labs:  Recent Labs  01/17/15 0330  01/18/15 0345 01/18/15 1447 01/19/15 0445  HGB 7.8*  --  7.8*  --  7.6*  HCT 23.4*  --  23.5*  --  23.3*  PLT 66*  --  75*  --  73*  APTT 62*  < > 36 67* 71*  LABPROT 18.6*  --  17.8*  --  18.0*  INR 1.54*  --  1.45  --  1.48  HEPARINUNFRC 0.80*  --  0.24*  --  0.40  CREATININE 5.02*  5.02*  --  5.77*  --   --   < > = values in this interval not displayed.  Estimated Creatinine Clearance: 20.4 mL/min (by C-G formula based on Cr of 5.77).   Medical History: Past Medical History  Diagnosis Date  . HYPERLIPIDEMIA-MIXED   . Obesity, unspecified   . HYPERTENSION, UNSPECIFIED   . CAD (coronary artery disease)     a. Nonobstructive moderate CAD by cath - last 2012.  [name] Hypertrophic obstructive cardiomyopathy   . Complete heart block     a. s/p pacemaker later upgraded to CRT-D (MDT).  . CKD (chronic kidney disease), stage IV     a. Baseline Cr 1.4-1.5 prior to 09/2014-->2.3-2.8 during admission 09/2014.  [name] Chronic systolic CHF (congestive heart failure)     a. NICM (EF 25% in 2012, 20-25% in 09/2013). b. s/p BiV-ICD (MDT);  c. 10/2014 EF 25%.  . Colon polyps   .  Ventricular tachycardia     a. EPS/RFCA of VT in 2012. b. On amio, mexilitene. c. Last BiV-ICD gen change 05/2013 (MDT).  . Hypothyroidism   . Fatty liver disease, nonalcoholic   . BPH (benign prostatic hyperplasia)   . Habitual alcohol use     Quit Christmas  . Liver cirrhosis   . 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 DM     insulin dependent   . Left ventricular apical thrombus     a. 10/2014 Echo: 1.5x1.5 cm apical clot-->on eliquis.  [name] NICM (nonischemic cardiomyopathy)     a. 10/2014 Echo: EF 25%, diff HK, 1.5 x 1.5 cm apical clot, mod dil LA.      Assessment: 59yom admitted with HF exacerbation and started on milrinone.  He was on apixaban pta for LV apical thrombus.  He is CKD IV and probable soon HD candidate.  Apixaban is now on hold with last dose 4/13 am.   HL elevated d/t  apixaban affects HL with false elevation.  Heparin levels now beginning to correlate with PTTs.    Heparin drip 1850 uts/hr aPTT 71 sec at goal.  INR 1.48.  CBC stable, no bleeding noted.    Asked to begin Coumadin since apixaban not preferred due to worsening renal function.  Goal of Therapy:  Heparin level 0.3-0.7 units/ml aPTT 60-100  seconds Monitor platelets by anticoagulation protocol: Yes   Plan:  Continue Heparin drip at 1850 units/hr. Coumadin 7.5 mg po x 1 tonight. Begin Coumadin education. Daily HL, CBC, INR - will stop monitoring PTT's and switch to only heparin levels since now accurate.  Tad Moore, BCPS  Clinical Pharmacist Pager [phone]  01/19/2015 7:54 AM

## 2015-01-19 NOTE — Progress Notes (Signed)
Assessment/Plan: 1. AKI/CKD- in setting of decompensated CHF consistent with cardiorenal syndrome.  Did not do well with hemodialysis due to low BP, 900 cc removed but he is now anuric.               Will begin CRRT. 2. Acute on chronic systolic CHF- currently on milrinone. 3. Hyperkalemia- stable 4. 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 Complicates management significantly due to low BP and ascites 5. DM- per primary service 6. Anemia- likely due to chronic kidney disease. Give iron and begin aranesp 7. CMP- EF of 25% will make longterm dialysis difficult 8. CAD- chest pain free, per cards 9. Protein malnutrition- likely related to cirrhosis.  Subjective: Interval History: Intolerant of IHD  Objective: Vital signs in last 24 hours: Temp:  [96.6 F (35.9 C)-98.2 F (36.8 C)] 98.2 F (36.8 C) (04/17 0755) Pulse Rate:  [72-76] 72 (04/16 2000) Resp:  [17-24] 20 (04/17 0357) BP: (76-109)/(18-74) 82/62 mmHg (04/17 0755) SpO2:  [92 %-100 %] 98 % (04/17 0755) Weight:  [141.8 kg (312 lb 9.8 oz)-142.3 kg (313 lb 11.4 oz)] 141.8 kg (312 lb 9.8 oz) (04/17 0446) Weight change: 2.7 kg (5 lb 15.2 oz)  Intake/Output from previous day: 04/16 0701 - 04/17 0700 In: 655.4 [I.V.:469.4; IV Piggyback:186] Out: 1100 [Emesis/NG output:200] Intake/Output this shift: Total I/O In: 334.7 [P.O.:240; I.V.:28.7; IV Piggyback:66] Out: -   General appearance: alert and cooperative Resp: anteriorly clear Cardio: regular rate and rhythm, S1, S2 normal, no murmur, click, rub or gallop Extremities: edema 2-3+  Lab Results:  Recent Labs  01/18/15 0345 01/19/15 0445  WBC 6.3 6.0  HGB 7.8* 7.6*  HCT 23.5* 23.3*  PLT 75* 73*   BMET:  Recent Labs  01/17/15 0330 01/18/15 0345  NA 132*  133* 130*  K 4.9  5.0 5.0  CL 101  102 98  CO2 _0 GLUCOSE 128*  129* 93  BUN 78*  77* 80*  CREATININE 5.02*  5.02* 5.77*  CALCIUM 8.8  8.9 8.8   No results for input(s):  PTH in the last 72 hours. Iron Studies:  Recent Labs  01/16/15 1520  IRON 64  TIBC 214*  FERRITIN 206   Studies/Results: Ir Fluoro Guide Cv Line Right  01/17/2015   INDICATION: 59 year old male with acute on chronic kidney disease. He has been referred for hemodialysis catheter placement.  EXAM: TUNNELED CENTRAL VENOUS HEMODIALYSIS CATHETER PLACEMENT WITH ULTRASOUND AND FLUOROSCOPIC GUIDANCE  MEDICATIONS: Ancef 2 gm IV; The IV antibiotic was given in an appropriate time interval prior to skin puncture.  CONTRAST:  None  ANESTHESIA/SEDATION: Versed 1.0 mg IV; Fentanyl 50 mcg IV  Total Moderate Sedation Time  Twenty-one minutes.  FLUOROSCOPY TIME:  Zero minutes 54 seconds.  COMPLICATIONS: None  PROCEDURE: Informed written consent was obtained from the patient after a discussion of the risks, benefits, and alternatives to treatment. Questions regarding the procedure were encouraged and answered. The right neck and chest were prepped with chlorhexidine in a sterile fashion, and a sterile drape was applied covering the operative field. Maximum barrier sterile technique with sterile gowns and gloves were used for the procedure. A timeout was performed prior to the initiation of the procedure.  After creating a small venotomy incision, a micropuncture kit was utilized to access the right internal jugular vein under direct, real-time ultrasound guidance after the overlying soft tissues were anesthetized with 1% lidocaine with epinephrine. Ultrasound image documentation was performed. The microwire was kinked  to measure appropriate catheter length. A stiff glidewire was advanced to the level of the IVC and the micropuncture sheath was exchanged for a peel-away sheath. A split tear tunneled hemodialysis catheter measuring 19 cm from tip to cuff was tunneled in a retrograde fashion from the anterior chest wall to the venotomy incision.  The catheter was then placed through the peel-away sheath with tips ultimately  positioned within the superior aspect of the right atrium. Final catheter positioning was confirmed and documented with a spot radiographic image. The catheter aspirates and flushes normally. The catheter was flushed with appropriate volume heparin dwells.  The catheter exit site was secured with a 0-Prolene retention suture. The venotomy incision was closed with Dermabond and Steri-strips. Dressings were applied. The patient tolerated the procedure well without immediate post procedural complication.  IMPRESSION: Status post placement of right IJ approach tunneled hemodialysis catheter, split tip measuring 19 cm tip to cuff. Catheter ready for use.  Signed,  Dulcy Fanny. Earleen Newport, DO  [facility]  [facility]   Electronically Signed   By: Corrie Mckusick D.O.   On: 01/17/2015 17:54   Ir US Guide Vasc Access Right  01/17/2015   INDICATION: 59 year old male with acute on chronic kidney disease. He has been referred for hemodialysis catheter placement.  EXAM: TUNNELED CENTRAL VENOUS HEMODIALYSIS CATHETER PLACEMENT WITH ULTRASOUND AND FLUOROSCOPIC GUIDANCE  MEDICATIONS: Ancef 2 gm IV; The IV antibiotic was given in an appropriate time interval prior to skin puncture.  CONTRAST:  None  ANESTHESIA/SEDATION: Versed 1.0 mg IV; Fentanyl 50 mcg IV  Total Moderate Sedation Time  Twenty-one minutes.  FLUOROSCOPY TIME:  Zero minutes 54 seconds.  COMPLICATIONS: None  PROCEDURE: Informed written consent was obtained from the patient after a discussion of the risks, benefits, and alternatives to treatment. Questions regarding the procedure were encouraged and answered. The right neck and chest were prepped with chlorhexidine in a sterile fashion, and a sterile drape was applied covering the operative field. Maximum barrier sterile technique with sterile gowns and gloves were used for the procedure. A timeout was performed prior to the initiation of the procedure.  After creating a small  venotomy incision, a micropuncture kit was utilized to access the right internal jugular vein under direct, real-time ultrasound guidance after the overlying soft tissues were anesthetized with 1% lidocaine with epinephrine. Ultrasound image documentation was performed. The microwire was kinked to measure appropriate catheter length. A stiff glidewire was advanced to the level of the IVC and the micropuncture sheath was exchanged for a peel-away sheath. A split tear tunneled hemodialysis catheter measuring 19 cm from tip to cuff was tunneled in a retrograde fashion from the anterior chest wall to the venotomy incision.  The catheter was then placed through the peel-away sheath with tips ultimately positioned within the superior aspect of the right atrium. Final catheter positioning was confirmed and documented with a spot radiographic image. The catheter aspirates and flushes normally. The catheter was flushed with appropriate volume heparin dwells.  The catheter exit site was secured with a 0-Prolene retention suture. The venotomy incision was closed with Dermabond and Steri-strips. Dressings were applied. The patient tolerated the procedure well without immediate post procedural complication.  IMPRESSION: Status post placement of right IJ approach tunneled hemodialysis catheter, split tip measuring 19 cm tip to cuff. Catheter ready for use.  Signed,  Dulcy Fanny. Earleen Newport, DO  [facility]  [facility]   Electronically Signed   By: York Cerise  Earleen Newport D.O.   On: 01/17/2015 17:54    Scheduled: . insulin aspart  0-15 Units Subcutaneous TID WC  . insulin aspart  0-5 Units Subcutaneous QHS  . insulin aspart  16 Units Subcutaneous Q supper  . insulin glargine  50 Units Subcutaneous BID  . levothyroxine  150 mcg Oral QAC breakfast  . linagliptin  5 mg Oral Daily  . mexiletine  150 mg Oral TID  . rosuvastatin  10 mg Oral Daily  . sodium chloride  3 mL Intravenous Q12H  .  tamsulosin  0.4 mg Oral Daily  . warfarin  7.5 mg Oral ONCE-1800  . warfarin   Does not apply Once  . Warfarin - Pharmacist Dosing Inpatient   Does not apply q1800     LOS: 4 days   Rhodes Calvert C 01/19/2015,10:58 AM

## 2015-01-19 NOTE — Progress Notes (Addendum)
Patient Name: [name] Date of Encounter: 01/19/2015  Active Problems:   Acute on chronic systolic CHF (congestive heart failure)   Dialysis patient   Length of Stay: 4  SUBJECTIVE  Anuric, despite several high dose boluses of furosemide. Hypotensive (systolic in 80s). Unable to sit up for more than a few minutes due to weakness Frequent V sensed and fusion beats on telemetry.  CURRENT MEDS . carvedilol  3.125 mg Oral BID WC  . furosemide  160 mg Intravenous Q6H  . insulin aspart  0-15 Units Subcutaneous TID WC  . insulin aspart  0-5 Units Subcutaneous QHS  . insulin aspart  16 Units Subcutaneous Q supper  . insulin glargine  50 Units Subcutaneous BID  . levothyroxine  150 mcg Oral QAC breakfast  . linagliptin  5 mg Oral Daily  . mexiletine  150 mg Oral TID  . rosuvastatin  10 mg Oral Daily  . sodium chloride  3 mL Intravenous Q12H  . tamsulosin  0.4 mg Oral Daily  . warfarin  7.5 mg Oral ONCE-1800  . warfarin   Does not apply Once  . Warfarin - Pharmacist Dosing Inpatient   Does not apply q1800    OBJECTIVE   Intake/Output Summary (Last 24 hours) at 01/19/15 0952 Last data filed at 01/19/15 0926  Gross per 24 hour  Intake  990.1 ml  Output   1100 ml  Net -109.9 ml   Filed Weights   01/18/15 0800 01/18/15 1159 01/19/15 0446  Weight: 315 lb 0.6 oz (142.9 kg) 313 lb 11.4 oz (142.3 kg) 312 lb 9.8 oz (141.8 kg)    PHYSICAL EXAM Filed Vitals:   01/19/15 0357 01/19/15 0446 01/19/15 0700 01/19/15 0755  BP: 102/30  81/34 82/62  Pulse:      Temp: 97.6 F (36.4 C)   98.2 F (36.8 C)  TempSrc: Oral   Oral  Resp: 20     Height:      Weight:  312 lb 9.8 oz (141.8 kg)    SpO2:    98%   General: Alert, oriented x3, no acute distress, but appears ill and frail Head: no evidence of trauma, PERRL, EOMI, no exophtalmos or lid lag, no myxedema, no xanthelasma; normal ears, nose and oropharynx Neck: marked elevation in left jugular venous pulsations, right IJ  dialysis line; brisk carotid pulses without delay and no carotid bruits Chest: clear to auscultation, no signs of consolidation by percussion or palpation, normal fremitus, symmetrical and full respiratory excursions Cardiovascular: displaced, effaced apical impulse, regular rhythm, normal first and split second heart sounds, no rub, has a summation gallop, no murmur Abdomen: no tenderness, has distention due to ascites, no masses by palpation, no abnormal pulsatility or arterial bruits, normal bowel sounds, no hepatosplenomegaly Extremities: no clubbing, cyanosis, 3+ deep pitting edema to thighs; 2+ radial, ulnar and brachial pulses bilaterally; unable to locate posterior tibial and dorsalis pedis pulses; no subclavian or femoral bruits Neurological: grossly nonfocal LABS  CBC  Recent Labs  01/18/15 0345 01/19/15 0445  WBC 6.3 6.0  HGB 7.8* 7.6*  HCT 23.5* 23.3*  MCV 87.4 89.3  PLT 75* 73*   Basic Metabolic Panel  Recent Labs  01/17/15 0330 01/18/15 0345  NA 132*  133* 130*  K 4.9  5.0 5.0  CL 101  102 98  CO2 GLUCOSE 128*  129* 93  BUN 78*  77* 80*  CREATININE 5.02*  5.02* 5.77*  CALCIUM 8.8  8.9 8.8  PHOS 5.4*  --    Liver Function Tests  Recent Labs  01/17/15 0330  ALBUMIN 2.6*    Radiology Studies Imaging results have been reviewed  TELE AV sequential paced   ASSESSMENT AND PLAN  1. Acute on chronic systolic CHF and cardiogenic shock: NYHA class IV symptoms with marked volume overload. Low output failure with cardiorenal syndrome. Echo showed EF 30-35% with mobile apical thrombus (known from past). Co-ox improved to 66% with milrinone, feels better but now anuric.  - Continue milrinone 0.25 mcg/kg/min for now. - Minimal effect from maximal dose diuretics. I think he will need HD vs CVVH for fluid removal. Nephrology following.  - Will stop Coreg at least temporarily, although I worry about worsening arrhythmia and loss of BiV  pacing - Dialysis for hypervolemia is only treatment option at this point 2. Acute on chronic anuric renal failure: massively overloaded, low BP limits HD. I think only solution is CRRT. 3. H/o CHB: Has [employer] CRT-D device 4. Hyperkalemia: Improved with treatment.  5. H/o VT: Continue Mexiletine.  6. LV apical thrombus: Currently he is on heparin IV, transitioning to warfarin.  7. Anemia: No active bleeding. Send hemoccult. May be anemia of renal disease. Might benefit from transfusion during CRRT.   Thurmon Fair, MD, [facility] [phone] office [phone] pager 01/19/2015 9:52 AM

## 2015-01-20 LAB — CBC
HEMATOCRIT: 25.6 % — AB (ref 39.0–52.0)
Hemoglobin: 8.3 g/dL — ABNORMAL LOW (ref 13.0–17.0)
MCH: 29.2 pg (ref 26.0–34.0)
MCHC: 32.4 g/dL (ref 30.0–36.0)
MCV: 90.1 fL (ref 78.0–100.0)
PLATELETS: 94 10*3/uL — AB (ref 150–400)
RBC: 2.84 MIL/uL — ABNORMAL LOW (ref 4.22–5.81)
RDW: 16.1 % — AB (ref 11.5–15.5)
WBC: 7.3 10*3/uL (ref 4.0–10.5)

## 2015-01-20 LAB — RENAL FUNCTION PANEL
ALBUMIN: 2.6 g/dL — AB (ref 3.5–5.2)
ALBUMIN: 2.7 g/dL — AB (ref 3.5–5.2)
ANION GAP: 11 (ref 5–15)
ANION GAP: 10 (ref 5–15)
BUN: 39 mg/dL — AB (ref 6–23)
BUN: 35 mg/dL — ABNORMAL HIGH (ref 6–23)
CO2: 23 mmol/L (ref 19–32)
CO2: 23 mmol/L (ref 19–32)
Calcium: 8.5 mg/dL (ref 8.4–10.5)
Calcium: 8.6 mg/dL (ref 8.4–10.5)
Chloride: 97 mmol/L (ref 96–112)
Chloride: 97 mmol/L (ref 96–112)
Creatinine, Ser: 4.63 mg/dL — ABNORMAL HIGH (ref 0.50–1.35)
Creatinine, Ser: 4.29 mg/dL — ABNORMAL HIGH (ref 0.50–1.35)
GFR calc Af Amer: 15 mL/min — ABNORMAL LOW (ref >90)
GFR calc Af Amer: 16 mL/min — ABNORMAL LOW (ref >90)
GFR calc non Af Amer: 14 mL/min — ABNORMAL LOW (ref >90)
GFR, EST NON AFRICAN AMERICAN: 13 mL/min — AB (ref >90)
Glucose, Bld: 60 mg/dL — ABNORMAL LOW (ref 70–99)
Glucose, Bld: 128 mg/dL — ABNORMAL HIGH (ref 70–99)
PHOSPHORUS: 4.6 mg/dL (ref 2.3–4.6)
PHOSPHORUS: 4.2 mg/dL (ref 2.3–4.6)
POTASSIUM: 4.6 mmol/L (ref 3.5–5.1)
Potassium: 4.3 mmol/L (ref 3.5–5.1)
Sodium: 131 mmol/L — ABNORMAL LOW (ref 135–145)
Sodium: 130 mmol/L — ABNORMAL LOW (ref 135–145)

## 2015-01-20 LAB — PROTIME-INR
INR: 1.84 — ABNORMAL HIGH (ref 0.00–1.49)
PROTHROMBIN TIME: 21.4 s — AB (ref 11.6–15.2)

## 2015-01-20 LAB — MAGNESIUM: MAGNESIUM: 2.1 mg/dL (ref 1.5–2.5)

## 2015-01-20 LAB — CARBOXYHEMOGLOBIN
Carboxyhemoglobin: 1.5 % (ref 0.5–1.5)
Methemoglobin: 1.2 % (ref 0.0–1.5)
O2 SAT: 65.3 %
Total hemoglobin: 10.8 g/dL — ABNORMAL LOW (ref 13.5–18.0)

## 2015-01-20 LAB — GLUCOSE, CAPILLARY
GLUCOSE-CAPILLARY: 69 mg/dL — AB (ref 70–99)
GLUCOSE-CAPILLARY: 120 mg/dL — AB (ref 70–99)
Glucose-Capillary: 84 mg/dL (ref 70–99)
Glucose-Capillary: 99 mg/dL (ref 70–99)
Glucose-Capillary: 125 mg/dL — ABNORMAL HIGH (ref 70–99)

## 2015-01-20 LAB — HEPARIN LEVEL (UNFRACTIONATED): HEPARIN UNFRACTIONATED: 0.34 [IU]/mL (ref 0.30–0.70)

## 2015-01-20 MED ORDER — NOREPINEPHRINE BITARTRATE 1 MG/ML IV SOLN
0.0000 ug/min | INTRAVENOUS | Status: DC
  Administered 2015-01-20: 5 ug/min via INTRAVENOUS
  Administered 2015-01-20: 32 ug/min via INTRAVENOUS
  Administered 2015-01-21 (×3): 35 ug/min via INTRAVENOUS
  Administered 2015-01-22: 40 ug/min via INTRAVENOUS
  Filled 2015-01-20 (×6): qty 16

## 2015-01-20 MED ORDER — WARFARIN SODIUM 7.5 MG PO TABS
7.5000 mg | ORAL_TABLET | Freq: Once | ORAL | Status: AC
  Administered 2015-01-20: 7.5 mg via ORAL
  Filled 2015-01-20: qty 1

## 2015-01-20 MED ORDER — VASOPRESSIN 20 UNIT/ML IV SOLN
0.0300 [IU]/min | INTRAVENOUS | Status: DC
  Administered 2015-01-20 – 2015-01-22 (×2): 0.03 [IU]/min via INTRAVENOUS
  Filled 2015-01-20 (×2): qty 2

## 2015-01-20 NOTE — Progress Notes (Signed)
ANTICOAGULATION CONSULT NOTE - Follow Up Consult  Pharmacy Consult for  Heparin/Coumadin  Indication: LV apical thrombus  Allergies  Allergen Reactions  . Ace Inhibitors     AVOID ALL  BECAUSE OF HEART DISEASE   . Amiodarone     Abnormal LFTs (10/2014)  . Nitroglycerin Other (See Comments)    Reaction unknown  . Procan Sr [Procainamide Hcl]     PT. HAS HEART DISEASE CALLED HYPERTHROPHIC CARDIOMYOPATHY  . Quinidine     PT. HAS HEART DISEASE CALLED HYPERTHROPHIC CARDIOMYOPATHY      Patient Measurements: Height: 6\' 1"  (185.4 cm) Weight: (!) 309 lb 8.4 oz (140.4 kg) IBW/kg (Calculated) : 79.9 Heparin Dosing Weight: 114 kg  Vital Signs: Temp: 97.6 F (36.4 C) (04/18 0730) Temp Source: Oral (04/18 0730) BP: 109/43 mmHg (04/18 1100) Pulse Rate: 70 (04/18 1100)  Labs:  Recent Labs  01/18/15 0345 01/18/15 1447 01/19/15 0445 01/19/15 1620 01/20/15 0400  HGB 7.8*  --  7.6*  --  8.3*  HCT 23.5*  --  23.3*  --  25.6*  PLT 75*  --  73*  --  94*  APTT 36 67* 71*  --   --   LABPROT 17.8*  --  18.0*  --  21.4*  INR 1.45  --  1.48  --  1.84*  HEPARINUNFRC 0.24*  --  0.40  --  0.34  CREATININE 5.77*  --   --  5.11* 4.63*    Estimated Creatinine Clearance: 25.3 mL/min (by C-G formula based on Cr of 4.63).   Medical History: Past Medical History  Diagnosis Date  . HYPERLIPIDEMIA-MIXED   . Obesity, unspecified   . HYPERTENSION, UNSPECIFIED   . CAD (coronary artery disease)     a. Nonobstructive moderate CAD by cath - last 2012.  [name] Hypertrophic obstructive cardiomyopathy   . Complete heart block     a. s/p pacemaker later upgraded to CRT-D (MDT).  . CKD (chronic kidney disease), stage IV     a. Baseline Cr 1.4-1.5 prior to 09/2014-->2.3-2.8 during admission 09/2014.  [name] Chronic systolic CHF (congestive heart failure)     a. NICM (EF 25% in 2012, 20-25% in 09/2013). b. s/p BiV-ICD (MDT);  c. 10/2014 EF 25%.  . Colon polyps   . Ventricular tachycardia     a. EPS/RFCA of VT  in 2012. b. On amio, mexilitene. c. Last BiV-ICD gen change 05/2013 (MDT).  . Hypothyroidism   . Fatty liver disease, nonalcoholic   . BPH (benign prostatic hyperplasia)   . Habitual alcohol use     Quit Christmas  . Liver cirrhosis   . 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 DM     insulin dependent   . Left ventricular apical thrombus     a. 10/2014 Echo: 1.5x1.5 cm apical clot-->on eliquis.  [name] NICM (nonischemic cardiomyopathy)     a. 10/2014 Echo: EF 25%, diff HK, 1.5 x 1.5 cm apical clot, mod dil LA.    Assessment: 67 yom admitted with HF exacerbation and started on milrinone.  He was on apixaban pta for LV apical thrombus.  He is CKD IV and probable soon HD candidate.  Apixaban is now on hold with last dose 4/13 am.   HL elevated d/t apixaban affects HL with false elevation.  Heparin levels now beginning to correlate with PTTs.    Heparin drip 1850 uts/hr, heparin level at goal.  INR 1.84.  CBC stable, no bleeding noted.  Asked to begin Coumadin since apixaban not preferred due to worsening renal function.  Goal of Therapy:  Heparin level 0.3-0.7 units/ml aPTT 60-100  seconds Monitor platelets by anticoagulation protocol: Yes   Plan:  Continue Heparin drip at 1850 units/hr. Coumadin 7.5 mg po x 1 tonight. Begin Coumadin education when more appropriate. Daily HL, CBC, INR - will stop monitoring PTT's and switch to only heparin levels since now accurate.  Tad Moore, BCPS  Clinical Pharmacist Pager [phone]  01/20/2015 11:04 AM

## 2015-01-20 NOTE — Progress Notes (Signed)
Patient ID: [name], male   DOB: 1956/01/26, 59 y.o.   MRN: [id number]   SUBJECTIVE:   Yesterday he was started on CVVHD (failed IHD due to low BP). Hypotensive over night with Norepi 5 and Vasopressin added. He continues on Milrinone 0.25 mcg. Remains anuric.   Denies SOB.  CVP 26    Scheduled Meds: . darbepoetin (ARANESP) injection - DIALYSIS  100 mcg Intravenous Q Mon-HD  . insulin aspart  0-15 Units Subcutaneous TID WC  . insulin aspart  0-5 Units Subcutaneous QHS  . insulin aspart  16 Units Subcutaneous Q supper  . insulin glargine  50 Units Subcutaneous BID  . levothyroxine  150 mcg Oral QAC breakfast  . linagliptin  5 mg Oral Daily  . mexiletine  150 mg Oral TID  . rosuvastatin  10 mg Oral Daily  . sodium chloride  3 mL Intravenous Q12H  . tamsulosin  0.4 mg Oral Daily  . warfarin   Does not apply Once  . Warfarin - Pharmacist Dosing Inpatient   Does not apply q1800   Continuous Infusions: . heparin 1,850 Units/hr (01/19/15 1843)  . milrinone 0.25 mcg/kg/min (01/20/15 0145)  . norepinephrine (LEVOPHED) Adult infusion 5 mcg/min (01/20/15 0145)  . dialysis replacement fluid (prismasate) 200 mL/hr at 01/19/15 1232  . dialysis replacement fluid (prismasate) 200 mL/hr at 01/19/15 1231  . dialysate (PRISMASATE) 1,000 mL/hr at 01/20/15 0333  . vasopressin (PITRESSIN) infusion - *FOR SHOCK* 0.03 Units/min (01/20/15 0145)   PRN Meds:.sodium chloride, acetaminophen, ALPRAZolam, heparin, heparin, ondansetron (ZOFRAN) IV, sodium chloride    Filed Vitals:   01/20/15 0400 01/20/15 0500 01/20/15 0600 01/20/15 0630  BP: 99/35 103/49 98/58 107/52  Pulse: 64 68 59 68  Temp: 97.5 F (36.4 C)     TempSrc: Oral     Resp: $Remo'30 22 23   'rcMyU$ Height:      Weight:  309 lb 8.4 oz (140.4 kg)    SpO2: 95% 96% 96% 90%    Intake/Output Summary (Last 24 hours) at 01/20/15 0710 Last data filed at 01/20/15 0600  Gross per 24 hour  Intake 2217.48 ml  Output   2221 ml  Net  -3.52 ml     LABS: Basic Metabolic Panel:  Recent Labs  01/19/15 1620 01/20/15 0400  NA 130* 131*  K 4.1 4.3  CL 97 97  CO2 22 23  GLUCOSE 121* 60*  BUN 48* 39*  CREATININE 5.11* 4.63*  CALCIUM 8.6 8.5  MG  --  2.1  PHOS 5.1* 4.6   Liver Function Tests:  Recent Labs  01/19/15 1620 01/20/15 0400  ALBUMIN 2.6* 2.6*   No results for input(s): LIPASE, AMYLASE in the last 72 hours. CBC:  Recent Labs  01/19/15 0445 01/20/15 0400  WBC 6.0 7.3  HGB 7.6* 8.3*  HCT 23.3* 25.6*  MCV 89.3 90.1  PLT 73* 94*   Cardiac Enzymes: No results for input(s): CKTOTAL, CKMB, CKMBINDEX, TROPONINI in the last 72 hours. BNP: Invalid input(s): POCBNP D-Dimer: No results for input(s): DDIMER in the last 72 hours. Hemoglobin A1C: No results for input(s): HGBA1C in the last 72 hours. Fasting Lipid Panel: No results for input(s): CHOL, HDL, LDLCALC, TRIG, CHOLHDL, LDLDIRECT in the last 72 hours. Thyroid Function Tests: No results for input(s): TSH, T4TOTAL, T3FREE, THYROIDAB in the last 72 hours.  Invalid input(s): FREET3 Anemia Panel: No results for input(s): VITAMINB12, FOLATE, FERRITIN, TIBC, IRON, RETICCTPCT in the last 72 hours.  RADIOLOGY: Dg Chest 2 View  12/30/2014  CLINICAL DATA:  Retained fluid, swollen belly and feet.  EXAM: CHEST  2 VIEW  COMPARISON:  10/11/2014  FINDINGS: Bilateral interstitial thickening with prominence of the central pulmonary vasculature. Trace bilateral pleural effusions. No pneumothorax. Stable cardiomegaly. There is a cardiac pacemaker present.  Osseous structures are unremarkable.  IMPRESSION: Cardiomegaly with pulmonary vascular congestion.   Electronically Signed   By: Kathreen Devoid   On: 12/30/2014 11:59   US Abdomen Limited  01/29/2015   CLINICAL DATA:  Ascites.  History of hepatic cirrhosis  EXAM: LIMITED ABDOMINAL ULTRASOUND  COMPARISON:  October 04, 2014  FINDINGS: There is moderate diffuse ascites throughout the abdomen with ascites noted in both  upper and lower quadrants. There are multiple loops of peristalsing bowel as well appear  IMPRESSION: Moderate generalized ascites.   Electronically Signed   By: Lowella Grip III M.D.   On: 01/11/2015 17:53   Ir Fluoro Guide Cv Line Right  01/17/2015   INDICATION: 59 year old male with acute on chronic kidney disease. He has been referred for hemodialysis catheter placement.  EXAM: TUNNELED CENTRAL VENOUS HEMODIALYSIS CATHETER PLACEMENT WITH ULTRASOUND AND FLUOROSCOPIC GUIDANCE  MEDICATIONS: Ancef 2 gm IV; The IV antibiotic was given in an appropriate time interval prior to skin puncture.  CONTRAST:  None  ANESTHESIA/SEDATION: Versed 1.0 mg IV; Fentanyl 50 mcg IV  Total Moderate Sedation Time  Twenty-one minutes.  FLUOROSCOPY TIME:  Zero minutes 54 seconds.  COMPLICATIONS: None  PROCEDURE: Informed written consent was obtained from the patient after a discussion of the risks, benefits, and alternatives to treatment. Questions regarding the procedure were encouraged and answered. The right neck and chest were prepped with chlorhexidine in a sterile fashion, and a sterile drape was applied covering the operative field. Maximum barrier sterile technique with sterile gowns and gloves were used for the procedure. A timeout was performed prior to the initiation of the procedure.  After creating a small venotomy incision, a micropuncture kit was utilized to access the right internal jugular vein under direct, real-time ultrasound guidance after the overlying soft tissues were anesthetized with 1% lidocaine with epinephrine. Ultrasound image documentation was performed. The microwire was kinked to measure appropriate catheter length. A stiff glidewire was advanced to the level of the IVC and the micropuncture sheath was exchanged for a peel-away sheath. A split tear tunneled hemodialysis catheter measuring 19 cm from tip to cuff was tunneled in a retrograde fashion from the anterior chest wall to the venotomy  incision.  The catheter was then placed through the peel-away sheath with tips ultimately positioned within the superior aspect of the right atrium. Final catheter positioning was confirmed and documented with a spot radiographic image. The catheter aspirates and flushes normally. The catheter was flushed with appropriate volume heparin dwells.  The catheter exit site was secured with a 0-Prolene retention suture. The venotomy incision was closed with Dermabond and Steri-strips. Dressings were applied. The patient tolerated the procedure well without immediate post procedural complication.  IMPRESSION: Status post placement of right IJ approach tunneled hemodialysis catheter, split tip measuring 19 cm tip to cuff. Catheter ready for use.  Signed,  Dulcy Fanny. Earleen Newport, DO  [facility]  [facility]   Electronically Signed   By: Corrie Mckusick D.O.   On: 01/17/2015 17:54   Ir US Guide Vasc Access Right  01/17/2015   INDICATION: 59 year old male with acute on chronic kidney disease. He has been referred for hemodialysis catheter placement.  EXAM: TUNNELED CENTRAL VENOUS HEMODIALYSIS  CATHETER PLACEMENT WITH ULTRASOUND AND FLUOROSCOPIC GUIDANCE  MEDICATIONS: Ancef 2 gm IV; The IV antibiotic was given in an appropriate time interval prior to skin puncture.  CONTRAST:  None  ANESTHESIA/SEDATION: Versed 1.0 mg IV; Fentanyl 50 mcg IV  Total Moderate Sedation Time  Twenty-one minutes.  FLUOROSCOPY TIME:  Zero minutes 54 seconds.  COMPLICATIONS: None  PROCEDURE: Informed written consent was obtained from the patient after a discussion of the risks, benefits, and alternatives to treatment. Questions regarding the procedure were encouraged and answered. The right neck and chest were prepped with chlorhexidine in a sterile fashion, and a sterile drape was applied covering the operative field. Maximum barrier sterile technique with sterile gowns and gloves were used for the procedure. A  timeout was performed prior to the initiation of the procedure.  After creating a small venotomy incision, a micropuncture kit was utilized to access the right internal jugular vein under direct, real-time ultrasound guidance after the overlying soft tissues were anesthetized with 1% lidocaine with epinephrine. Ultrasound image documentation was performed. The microwire was kinked to measure appropriate catheter length. A stiff glidewire was advanced to the level of the IVC and the micropuncture sheath was exchanged for a peel-away sheath. A split tear tunneled hemodialysis catheter measuring 19 cm from tip to cuff was tunneled in a retrograde fashion from the anterior chest wall to the venotomy incision.  The catheter was then placed through the peel-away sheath with tips ultimately positioned within the superior aspect of the right atrium. Final catheter positioning was confirmed and documented with a spot radiographic image. The catheter aspirates and flushes normally. The catheter was flushed with appropriate volume heparin dwells.  The catheter exit site was secured with a 0-Prolene retention suture. The venotomy incision was closed with Dermabond and Steri-strips. Dressings were applied. The patient tolerated the procedure well without immediate post procedural complication.  IMPRESSION: Status post placement of right IJ approach tunneled hemodialysis catheter, split tip measuring 19 cm tip to cuff. Catheter ready for use.  Signed,  Dulcy Fanny. Earleen Newport, DO  [facility]  [facility]   Electronically Signed   By: Corrie Mckusick D.O.   On: 01/17/2015 17:54   Dg Chest Port 1 View  01/08/2015   CLINICAL DATA:  Central catheter placement  EXAM: PORTABLE CHEST - 1 VIEW  COMPARISON:  December 30, 2014  FINDINGS: Central catheter tip is in the superior vena cava. There are pacemaker leads in the right atrium and right ventricle regions. No pneumothorax. Cardiomegaly with  pulmonary vascular within normal limits is stable. Lungs are clear. No adenopathy.  IMPRESSION: Central catheter tip in superior vena cava. No pneumothorax. Cardiomegaly. No change in pacemaker lead placement. No edema or consolidation.   Electronically Signed   By: Lowella Grip III M.D.   On: 01/24/2015 14:22    PHYSICAL EXAM General: NAD Neck: JVP to jaw, no thyromegaly or thyroid nodule. RIJ TLJ. R subclavian dialysis catheter Lungs: Clear to auscultation bilaterally with normal respiratory effort. CV: PMI difficult to detect. Heart somewhat distant, regular S1/S2, no definite S3/S4, no murmur. 3+ edema to thighs, sacral edema. No carotid bruit. Unable to palpate pedal pulses due to swelling Abdomen: Soft, nontender, no hepatosplenomegaly, moderate distention.  Skin: Intact without lesions or rashes.  Neurologic: Alert and oriented x 3.  Psych: Normal affect. Extremities: No clubbing or cyanosis. R and LLE 3+ edema.  HEENT: Normal.   TELEMETRY:  v-paced 70  ASSESSMENT AND PLAN: 59 yo with  history of nonobstructive CAD, nonischemic CMP with chronic systolic CHF, CKD IV, and history of LV apical thrombus presents with CHF. 1. Acute on chronic systolic CHF/cardiogenic shock: NYHA class IV symptoms with marked volume overload. Low output failure with cardiorenal syndrome. Echo showed EF 30-35% with mobile apical thrombus (known from past).   Now on CVVHD, Milrinone 0.25 mcg, Levo 5 mcg , and Vasopressin 0.03 units  - CO-OX  pending 2. CKD IV: Suspect cardiorenal syndrome. On CVVHD. (failed IHD) 3. H/o CHB: Has [employer]onic CRT-D device. Currently V paced. Will check ECG 4. Hyperkalemia: Improved with treatment.   5. H/o VT: Continue Mexiletine.  6. LV apical thrombus: Heparin gtt pending   After that, can initiate warfarin.  7. Anemia: No active bleeding.  Send hemoccult.  May be anemia of renal disease.    CLEGG,AMY NP-C  01/20/2015 7:10 AM  Patient seen and examined  with Darrick Grinder, NP. We discussed all aspects of the encounter. I agree with the assessment and plan as stated above.   Very concerning situation. Intolerant of iHD due to hypotension. Now on 3 pressors to tolerate CVVHD. Will stop milrinone. Increase levophed and wean vasopressin to try to get to single agent. Discussed with patient that if kidneys don't recover will need long-term HD however I am concerned that his heart may not be able to tolerate. Hopefully hemodynamics will improve with volume removal. PD may need to be considered as outpatient.  Appreciate Renal's assistance.   The patient is critically ill with multiple organ systems failure and requires high complexity decision making for assessment and support, frequent evaluation and titration of therapies, application of advanced monitoring technologies and extensive interpretation of multiple databases.   Critical Care Time devoted to patient care services described in this note is 35 Minutes.   Anginette Espejo,MD 7:45 AM

## 2015-01-20 NOTE — Progress Notes (Signed)
[name]'s BP has been very labile, anywhere from SBP in 70s to 110s. Last few readings were around 80s/low 40s. Decreased fluid removal due to the BP. Cardiology paged regarding BP and order for vasopressin received, no titration of levophed over .

## 2015-01-20 NOTE — Progress Notes (Signed)
   01/20/15 1200  Clinical Encounter Type  Visited With Patient and family together  Visit Type Initial  Advance Directives (For Healthcare)  Does patient have an advance directive? No  Would patient like information on creating an advanced directive? Yes - Educational materials given   Chaplain was paged to patient's room because the patient had some questions regarding an advanced directive. When chaplain arrived patient was sleeping heavily but patient's wife and daughter were present. Patient's wife wanted to know that if an advanced directive was necessary for the patient if she already knew what the patient had decided. Chaplain explained that as the patient's spouse she was already authorized to make medical choices for the patient in the event that the patient is not able to make his own decisions. Chaplain also further advised that if her and her husband had had conversations regarding life-prolonging treatment than it may be a good idea to get those conversations on to paper in the from of a living will. Patient's wife seemed to be leaning towards wanting her husband to complete an advanced directive after this conversation but will likely not be able to talk to him about it until later today. Chaplain provided advanced directive forms for the patient. Please contact spiritual care department if patient needs help facilitating the completion of the advanced directive form.  Cranston Neighbor, Chaplain  12:09 PM

## 2015-01-20 NOTE — Progress Notes (Signed)
Patient ID: [name], male   DOB: 02-Jul-1956, 59 y.o.   MRN: [id number] S:no new complaints O:BP 84/20 mmHg  Pulse 64  Temp(Src) 97.6 F (36.4 C) (Oral)  Resp 22  Ht  (1.854 m)  Wt 140.4 kg (309 lb 8.4 oz)  BMI 40.85 kg/m2  SpO2 95%  Intake/Output Summary (Last 24 hours) at 01/20/15 0947 Last data filed at 01/20/15 1191  Gross per 24 hour  Intake 2077.35 ml  Output   2545 ml  Net -467.65 ml   Intake/Output: I/O last 3 completed shifts: In: 2582.1 [P.O.:1090; I.V.:1243.1; IV Piggyback:249] Out: 2560 [Emesis/NG output:200; Other:2360]  Intake/Output this shift:  Total I/O In: 164.5 [P.O.:60; I.V.:104.5] Out: 185 [Other:185] Weight change: -2.5 kg (-5 lb 8.2 oz) Gen:WD obese WM in NAD CVS:no rub Resp:decreased bs at bases YNW:GNFAO/[id number], +fluid wave, +BS Ext:3+ edema   Recent Labs Lab 02/03/15 1300 Feb 03, 2015 2020 01/16/15 0430 01/17/15 0330 01/18/15 0345 01/19/15 1620 01/20/15 0400  NA 134* 134* 136 132*  133* 130* 130* 131*  K 6.6* 5.5* 5.4* 4.9  5.0 5.0 4.1 4.3  CL 104 103 104 101  102 98 97 97  CO2 GLUCOSE 68* 149* 90 128*  129* 93 121* 60*  BUN 77* 76* 76* 78*  77* 80* 48* 39*  CREATININE 4.05* 4.12* 4.32* 5.02*  5.02* 5.77* 5.11* 4.63*  ALBUMIN 2.7*  --   --  2.6*  --  2.6* 2.6*  CALCIUM 9.2 8.9 8.9 8.8  8.9 8.8 8.6 8.5  PHOS  --   --   --  5.4*  --  5.1* 4.6  AST 58*  --   --   --   --   --   --   ALT 30  --   --   --   --   --   --    Liver Function Tests:  Recent Labs Lab 02/03/15 1300 01/17/15 0330 01/19/15 1620 01/20/15 0400  AST 58*  --   --   --   ALT 30  --   --   --   ALKPHOS 83  --   --   --   BILITOT 1.2  --   --   --   PROT 6.7  --   --   --   ALBUMIN 2.7* 2.6* 2.6* 2.6*   No results for input(s): LIPASE, AMYLASE in the last 168 hours. No results for input(s): AMMONIA in the last 168 hours. CBC:  Recent Labs Lab 01/16/15 0430  01/17/15 0330 01/18/15 0345 01/19/15 0445  01/20/15 0400  WBC 4.9  --  5.4 6.3 6.0 7.3  HGB 8.2*  --  7.8* 7.8* 7.6* 8.3*  HCT 25.0*  < > 23.4* 23.5* 23.3* 25.6*  MCV 87.7  --  87.3 87.4 89.3 90.1  PLT 72*  --  66* 75* 73* 94*  < > = values in this interval not displayed. Cardiac Enzymes:  Recent Labs Lab 02-03-15 1300  TROPONINI 0.40*   CBG:  Recent Labs Lab 01/19/15 0756 01/19/15 1231 01/19/15 1715 01/19/15 2146 01/20/15 0729  GLUCAP 135* 130* 115* 85 84    Iron Studies: No results for input(s): IRON, TIBC, TRANSFERRIN, FERRITIN in the last 72 hours. Studies/Results: No results found. . darbepoetin (ARANESP) injection - DIALYSIS  100 mcg Intravenous Q Mon-HD  . insulin aspart  0-15 Units Subcutaneous TID WC  . insulin aspart  0-5 Units Subcutaneous QHS  .  insulin aspart  16 Units Subcutaneous Q supper  . insulin glargine  50 Units Subcutaneous BID  . levothyroxine  150 mcg Oral QAC breakfast  . linagliptin  5 mg Oral Daily  . mexiletine  150 mg Oral TID  . rosuvastatin  10 mg Oral Daily  . sodium chloride  3 mL Intravenous Q12H  . tamsulosin  0.4 mg Oral Daily  . warfarin   Does not apply Once  . Warfarin - Pharmacist Dosing Inpatient   Does not apply q1800    BMET    Component Value Date/Time   NA 131* 01/20/2015 0400   NA 139 01/08/2015 1341   K 4.3 01/20/2015 0400   CL 97 01/20/2015 0400   CO2 23 01/20/2015 0400   GLUCOSE 60* 01/20/2015 0400   GLUCOSE 111* 01/08/2015 1341   BUN 39* 01/20/2015 0400   BUN 55* 01/08/2015 1341   CREATININE 4.63* 01/20/2015 0400   CREATININE 3.56* 01/13/2015 1230   CALCIUM 8.5 01/20/2015 0400   GFRNONAA 13* 01/20/2015 0400   GFRNONAA 50* 01/30/2013 1016   GFRAA 15* 01/20/2015 0400   GFRAA 58* 01/30/2013 1016   CBC    Component Value Date/Time   WBC 7.3 01/20/2015 0400   WBC 4.3* 12/19/2014 1458   RBC 2.84* 01/20/2015 0400   RBC 4.01* 12/19/2014 1458   RBC 3.31* 10/06/2014 0744   HGB 8.3* 01/20/2015 0400   HGB 10.9* 12/19/2014 1458   HCT 25.6*  01/20/2015 0400   HCT 24.0* 01/16/2015 1520   HCT 35.1* 12/19/2014 1458   PLT 94* 01/20/2015 0400   MCV 90.1 01/20/2015 0400   MCV 87.5 12/19/2014 1458   MCH 29.2 01/20/2015 0400   MCH 27.1 12/19/2014 1458   MCHC 32.4 01/20/2015 0400   MCHC 31.0* 12/19/2014 1458   RDW 16.1* 01/20/2015 0400   LYMPHSABS 0.9 12/30/2014 1155   MONOABS 0.7 12/30/2014 1155   EOSABS 0.2 12/30/2014 1155   BASOSABS 0.0 12/30/2014 1155     Assessment/Plan: 1. AKI/CKD- in setting of decompensated CHF worrisome for cardiorenal syndrome or even hepatorenal. Remains oliguric/anuric despite trial of high dose IV lasix and has failed IHD due to hypotension.  He has been started on CVVHD 01/19/15 but requires 3 pressors to maintain BP.  1. Will continue with CVVHDF for now, however if his BP does not improve with ultrafiltration, we may be dealing with a terminal process. 2. Recommend palliative care consult to help set goals/limits of care 2. Acute on chronic systolic CHF- Appreciate Cardiology's assistance in management.  He is currently on milrinone, norepi, and vasopressin gtts presumably due to cardiorenal syndrome and has failed to respond to high dose IV lasix and IHD (due to hypotension). 1. Continue with CVVHDF for now and wean pressors as able 3. Hyperkalemia- improving with CVVHD 4. 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 Complicates management significantly 5. DM- per primary service 6. Anemia- likely due to chronic kidney disease. Will check iron stores and may benefit from initiation of Aranesp. 7. CMP- EF of 25% will make longterm dialysis difficult 8. CAD- chest pain free, per cards 9. Protein malnutrition- likely related to cirrhosis. 10. Disposition- pt with multiple end-stage disease processes. Poor prognosis and not clear if he would be able to tolerate dialysis.  [name] A

## 2015-01-21 ENCOUNTER — Inpatient Hospital Stay (HOSPITAL_COMMUNITY): Payer: BLUE CROSS/BLUE SHIELD

## 2015-01-21 DIAGNOSIS — I481 Persistent atrial fibrillation: Secondary | ICD-10-CM

## 2015-01-21 DIAGNOSIS — I472 Ventricular tachycardia: Secondary | ICD-10-CM

## 2015-01-21 DIAGNOSIS — I5020 Unspecified systolic (congestive) heart failure: Secondary | ICD-10-CM

## 2015-01-21 LAB — RENAL FUNCTION PANEL
ALBUMIN: 2.6 g/dL — AB (ref 3.5–5.2)
Albumin: 2.6 g/dL — ABNORMAL LOW (ref 3.5–5.2)
Anion gap: 9 (ref 5–15)
Anion gap: 7 (ref 5–15)
BUN: 29 mg/dL — AB (ref 6–23)
BUN: 28 mg/dL — AB (ref 6–23)
CALCIUM: 8.6 mg/dL (ref 8.4–10.5)
CO2: 25 mmol/L (ref 19–32)
CO2: 25 mmol/L (ref 19–32)
CREATININE: 4.12 mg/dL — AB (ref 0.50–1.35)
CREATININE: 4.09 mg/dL — AB (ref 0.50–1.35)
Calcium: 8.5 mg/dL (ref 8.4–10.5)
Chloride: 96 mmol/L (ref 96–112)
Chloride: 97 mmol/L (ref 96–112)
GFR calc Af Amer: 17 mL/min — ABNORMAL LOW (ref >90)
GFR calc non Af Amer: 15 mL/min — ABNORMAL LOW (ref >90)
GFR, EST AFRICAN AMERICAN: 17 mL/min — AB (ref >90)
GFR, EST NON AFRICAN AMERICAN: 15 mL/min — AB (ref >90)
Glucose, Bld: 103 mg/dL — ABNORMAL HIGH (ref 70–99)
Glucose, Bld: 135 mg/dL — ABNORMAL HIGH (ref 70–99)
PHOSPHORUS: 4.2 mg/dL (ref 2.3–4.6)
Phosphorus: 4 mg/dL (ref 2.3–4.6)
Potassium: 4.6 mmol/L (ref 3.5–5.1)
Potassium: 5 mmol/L (ref 3.5–5.1)
Sodium: 130 mmol/L — ABNORMAL LOW (ref 135–145)
Sodium: 129 mmol/L — ABNORMAL LOW (ref 135–145)

## 2015-01-21 LAB — CBC
HCT: 26.9 % — ABNORMAL LOW (ref 39.0–52.0)
HCT: 25.2 % — ABNORMAL LOW (ref 39.0–52.0)
Hemoglobin: 8.5 g/dL — ABNORMAL LOW (ref 13.0–17.0)
Hemoglobin: 8.1 g/dL — ABNORMAL LOW (ref 13.0–17.0)
MCH: 29 pg (ref 26.0–34.0)
MCH: 29.5 pg (ref 26.0–34.0)
MCHC: 31.6 g/dL (ref 30.0–36.0)
MCHC: 32.1 g/dL (ref 30.0–36.0)
MCV: 91.8 fL (ref 78.0–100.0)
MCV: 91.6 fL (ref 78.0–100.0)
Platelets: 139 10*3/uL — ABNORMAL LOW (ref 150–400)
Platelets: 123 10*3/uL — ABNORMAL LOW (ref 150–400)
RBC: 2.93 MIL/uL — ABNORMAL LOW (ref 4.22–5.81)
RBC: 2.75 MIL/uL — ABNORMAL LOW (ref 4.22–5.81)
RDW: 16.4 % — AB (ref 11.5–15.5)
RDW: 16.6 % — AB (ref 11.5–15.5)
WBC: 15.7 10*3/uL — AB (ref 4.0–10.5)
WBC: 13.8 10*3/uL — ABNORMAL HIGH (ref 4.0–10.5)

## 2015-01-21 LAB — HEPARIN LEVEL (UNFRACTIONATED): Heparin Unfractionated: 0.28 IU/mL — ABNORMAL LOW (ref 0.30–0.70)

## 2015-01-21 LAB — GLUCOSE, CAPILLARY
GLUCOSE-CAPILLARY: 83 mg/dL (ref 70–99)
GLUCOSE-CAPILLARY: 122 mg/dL — AB (ref 70–99)
Glucose-Capillary: 75 mg/dL (ref 70–99)

## 2015-01-21 LAB — PROTIME-INR
INR: 4.12 — AB (ref 0.00–1.49)
INR: 4.15 — AB (ref 0.00–1.49)
PROTHROMBIN TIME: 40.2 s — AB (ref 11.6–15.2)
PROTHROMBIN TIME: 40.4 s — AB (ref 11.6–15.2)

## 2015-01-21 LAB — CARBOXYHEMOGLOBIN
CARBOXYHEMOGLOBIN: 1.7 % — AB (ref 0.5–1.5)
METHEMOGLOBIN: 1.1 % (ref 0.0–1.5)
O2 Saturation: 70.1 %
Total hemoglobin: 8.9 g/dL — ABNORMAL LOW (ref 13.5–18.0)

## 2015-01-21 LAB — MAGNESIUM: Magnesium: 2.3 mg/dL (ref 1.5–2.5)

## 2015-01-21 MED ORDER — AMIODARONE HCL IN DEXTROSE 360-4.14 MG/200ML-% IV SOLN
30.0000 mg/h | INTRAVENOUS | Status: DC
  Administered 2015-01-21: 30 mg/h via INTRAVENOUS

## 2015-01-21 MED ORDER — LACTULOSE 10 GM/15ML PO SOLN
20.0000 g | Freq: Every day | ORAL | Status: DC
  Administered 2015-01-21 – 2015-01-22 (×2): 20 g via ORAL
  Filled 2015-01-21 (×2): qty 30

## 2015-01-21 MED ORDER — AMIODARONE HCL IN DEXTROSE 360-4.14 MG/200ML-% IV SOLN: 30.0000 mg/h | INTRAVENOUS | Status: DC

## 2015-01-21 MED ORDER — CETYLPYRIDINIUM CHLORIDE 0.05 % MT LIQD
7.0000 mL | Freq: Two times a day (BID) | OROMUCOSAL | Status: DC
  Administered 2015-01-21 – 2015-01-22 (×2): 7 mL via OROMUCOSAL

## 2015-01-21 MED ORDER — AMIODARONE HCL IN DEXTROSE 360-4.14 MG/200ML-% IV SOLN
INTRAVENOUS | Status: AC
  Administered 2015-01-21: 200 mL
  Filled 2015-01-21: qty 200

## 2015-01-21 NOTE — Progress Notes (Signed)
PT Cancellation Note  Patient Details Name: [name] MRN: [id number] DOB: 1955-10-07   Cancelled Treatment:    Reason Eval/Treat Not Completed: Medical issues which prohibited therapy (pt with CRRT initiated today with INR >4 and will defer today for hopeful eval next date)   Delorse Lek 01/21/2015, 12:00 PM Delaney Meigs, PT [phone]

## 2015-01-21 NOTE — Consult Note (Signed)
ELECTROPHYSIOLOGY CONSULT NOTE    Patient ID: [name] MRN: [id number], DOB/AGE: 03-29-1956 59 y.o.  Admit date: 01/28/2015 Date of Consult: 01/21/2015  Primary Physician: [name], MD Primary Cardiologist: Hochrein Heart Failure: Shirlee Latch Electrophysiologist: Isaias Cowman MD: Shirlee Latch  Reason for Consultation: atrial fibrillation  HPI:  [name] is a 59 y.o. male with non ischemic cardiomyopathy (s/p MDT CRTD), ventricular tachycardia (previously on amiodarone but discontinued 2/2 elevated LFT's), hypertension, chronic systolic heart failure, chronic kidney disease (requiring dialysis this admission), hypothyroidism, and hepatic cirrhosis.  He was admitted 01/14/2015 with acute on chronic systolic heart failure.  Device interrogation demonstrated new onset AF beginning 4/17.  EP has been asked to evaluate if amiodarone is an option for the patient.   He is very lethargic and not able to answer questions reliably today.  He does not think he has completed his workup for elevated LFT's with GI.   Past Medical History  Diagnosis Date  . HYPERLIPIDEMIA-MIXED   . Obesity, unspecified   . HYPERTENSION, UNSPECIFIED   . CAD (coronary artery disease)     a. Nonobstructive moderate CAD by cath - last 2012.  [name] Hypertrophic obstructive cardiomyopathy   . Complete heart block     a. s/p pacemaker later upgraded to CRT-D (MDT).  . CKD (chronic kidney disease), stage IV     a. Baseline Cr 1.4-1.5 prior to 09/2014-->2.3-2.8 during admission 09/2014.  [name] Chronic systolic CHF (congestive heart failure)     a. NICM (EF 25% in 2012, 20-25% in 09/2013). b. s/p BiV-ICD (MDT);  c. 10/2014 EF 25%.  . Colon polyps   . Ventricular tachycardia     a. EPS/RFCA of VT in 2012. b. On amio, mexilitene. c. Last BiV-ICD gen change 05/2013 (MDT).  . Hypothyroidism   . Fatty liver disease, nonalcoholic   . BPH (benign prostatic hyperplasia)   . Habitual alcohol use     Quit Christmas  . Liver  cirrhosis   . 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 DM     insulin dependent   . Left ventricular apical thrombus     a. 10/2014 Echo: 1.5x1.5 cm apical clot-->on eliquis.  [name] NICM (nonischemic cardiomyopathy)     a. 10/2014 Echo: EF 25%, diff HK, 1.5 x 1.5 cm apical clot, mod dil LA.     Surgical History:  Past Surgical History  Procedure Laterality Date  . Pacemaker insertion      medtronic  . Cardiac catheterization    . Facial reconstruction surgery      after trauma in 2006  . Bi-ventricular implantable cardioverter defibrillator N/A 05/25/2013    Procedure: BI-VENTRICULAR IMPLANTABLE CARDIOVERTER DEFIBRILLATOR  (CRT-D);  Surgeon: Marinus Maw, MD;  Location: [facility] CATH LAB;  Service: Cardiovascular;  Laterality: N/A;  . Back surgery       Prescriptions prior to admission  Medication Sig Dispense Refill Last Dose  . apixaban (ELIQUIS) 5 MG TABS tablet Take 1 tablet (5 mg total) by mouth 2 (two) times daily. 180 tablet 3 01/14/2015 at Unknown time  . carvedilol (COREG) 6.25 MG tablet Take 1 tablet (6.25 mg total) by mouth 2 (two) times daily with a meal. 60 tablet 1 01/18/2015 at 0800  . cholecalciferol (VITAMIN D) 1000 UNITS tablet Take 2,000 Units by mouth daily at 12 noon.    01/14/2015 at Unknown time  . fish oil-omega-3 fatty acids 1000 MG capsule Take 1 capsule by mouth 3 (three) times daily.  06-Feb-2015 at Unknown time  . furosemide (LASIX) 40 MG tablet Take 3 tablets (120 mg total) by mouth 2 (two) times daily. 180 tablet 2 2015-02-06 at Unknown time  . hydrALAZINE (APRESOLINE) 10 MG tablet Take 1 tablet (10 mg total) by mouth every 8 (eight) hours. 90 tablet 1 2015/02/06 at Unknown time  . insulin glargine (LANTUS) 100 UNIT/ML injection Inject 50 Units into the skin 2 (two) times daily.  90 mL 3 02-06-15 at Unknown time  . insulin lispro (HUMALOG KWIKPEN) 100 UNIT/ML KiwkPen Inject 16 units with largest/evening meal.Dispense quikpens (Patient  taking differently: Inject 16 Units into the skin daily with supper. ) 30 mL 11 01/14/2015 at Unknown time  . isosorbide mononitrate (IMDUR) 30 MG 24 hr tablet Take 1 tablet (30 mg total) by mouth daily. 90 tablet 1 Feb 06, 2015 at Unknown time  . levothyroxine (SYNTHROID, LEVOTHROID) 150 MCG tablet Take 150 mcg by mouth daily before breakfast. To be taken with 1/2 of a tab (37.73mcg) to equal 187.   02/06/2015 at Unknown time  . levothyroxine (SYNTHROID, LEVOTHROID) 75 MCG tablet Take 37.5 mcg by mouth daily before breakfast. To be taken with levo to equal 187.   2015-02-06 at Unknown time  . linagliptin (TRADJENTA) 5 MG TABS tablet Take 1 tablet (5 mg total) by mouth daily. (Patient taking differently: Take 5 mg by mouth daily at 12 noon. ) 90 tablet 1 01/14/2015 at Unknown time  . metolazone (ZAROXOLYN) 2.5 MG tablet Take 1 tablet (2.5 mg total) by mouth daily. Take 30 minutes prior to AM dose of Lasix. 30 tablet 0 06-Feb-2015 at Unknown time  . mexiletine (MEXITIL) 150 MG capsule TAKE 1 CAPSULE EVERY 8 HOURS (Patient taking differently: Take 150 mg by mouth 3 (three) times daily. ) 270 capsule 3 Feb 06, 2015 at Unknown time  . potassium chloride SA (K-DUR,KLOR-CON) 20 MEQ tablet Take 2 tablets (40 mEq total) by mouth 3 (three) times daily. 180 tablet 2 06-Feb-2015 at Unknown time  . rosuvastatin (CRESTOR) 20 MG tablet Take 0.5 tablets (10 mg total) by mouth daily. (Patient taking differently: Take 10 mg by mouth at bedtime. ) 30 tablet 0 01/14/2015 at Unknown time  . tamsulosin (FLOMAX) 0.4 MG CAPS capsule Take 1 capsule (0.4 mg total) by mouth daily. 90 capsule 1 01/14/2015 at Unknown time    Inpatient Medications:  . darbepoetin (ARANESP) injection - DIALYSIS  100 mcg Intravenous Q Mon-HD  . insulin aspart  0-15 Units Subcutaneous TID WC  . insulin aspart  0-5 Units Subcutaneous QHS  . insulin aspart  16 Units Subcutaneous Q supper  . insulin glargine  50 Units Subcutaneous BID  .  lactulose  20 g Oral Daily  . levothyroxine  150 mcg Oral QAC breakfast  . linagliptin  5 mg Oral Daily  . mexiletine  150 mg Oral TID  . rosuvastatin  10 mg Oral Daily  . sodium chloride  3 mL Intravenous Q12H  . tamsulosin  0.4 mg Oral Daily  . warfarin   Does not apply Once  . Warfarin - Pharmacist Dosing Inpatient   Does not apply q1800    Allergies:  Allergies  Allergen Reactions  . Ace Inhibitors     AVOID ALL  BECAUSE OF HEART DISEASE   . Amiodarone     Abnormal LFTs (10/2014)  . Nitroglycerin Other (See Comments)    Reaction unknown  . Procan Sr [Procainamide Hcl]     PT. HAS HEART DISEASE CALLED HYPERTHROPHIC CARDIOMYOPATHY  .  Quinidine     PT. HAS HEART DISEASE CALLED HYPERTHROPHIC CARDIOMYOPATHY      History   Social History  . Marital Status: Married    Spouse Name: N/A  . Number of Children: 1  . Years of Education: N/A   Occupational History  . Not on file.   Social History Main Topics  . Smoking status: Former Smoker    Types: Cigarettes    Quit date: 10/04/1968  . Smokeless tobacco: Never Used     Comment: 30+ yrs  . Alcohol Use: Yes     Comment: about 1 cup of liquor daily (straight liquor - not mixed)  . Drug Use: No  . Sexual Activity: Not on file   Other Topics Concern  . Not on file   Social History Narrative   Lives at home with wife, son, daughter in law and two grands.      Family History  Problem Relation Age of Onset  . Diabetic kidney disease Father   . Coronary artery disease Neg Hx      Review of Systems: Pt lethargic and unable to provide.  Physical Exam: Filed Vitals:   01/21/15 0700 01/21/15 0800 01/21/15 0900 01/21/15 1000  BP: 116/42 110/38 110/37 107/37  Pulse: 70 58 67 59  Temp:  98 F (36.7 C)    TempSrc:  Oral    Resp: 18 19 23 20   Height:      Weight:      SpO2: 96% 97% 97% 99%    GEN- The patient is ill appearing, lethargic, awakes to voice but goes right back to sleep HEENT: normocephalic,  atraumatic; sclera clear, conjunctiva pink; hearing intact; oropharynx clear; neck supple +JVD, R neck CVL in place Lungs- decreased BS at the bases, normal work of breathing.  No wheezes, rales, rhonchi Heart- Regular rate and rhythm (paced) GI- soft, non-tender, non-distended, bowel sounds present,   Extremities- + dependant edema MS- no significant deformity or atrophy Skin- warm and dry, no rash or lesion Psych- lethargic Neuro- lethargic  Labs:   Lab Results  Component Value Date   WBC 13.8* 01/21/2015   HGB 8.1* 01/21/2015   HCT 25.2* 01/21/2015   MCV 91.6 01/21/2015   PLT 123* 01/21/2015    Recent Labs Lab 02/04/2015 1300  01/21/15 0515  NA 134*  < > 130*  K 6.6*  < > 4.6  CL 104  < > 96  CO2 20  < > 25  BUN 77*  < > 29*  CREATININE 4.05*  < > 4.12*  CALCIUM 9.2  < > 8.6  PROT 6.7  --   --   BILITOT 1.2  --   --   ALKPHOS 83  --   --   ALT 30  --   --   AST 58*  --   --   GLUCOSE 68*  < > 103*  < > = values in this interval not displayed.    Radiology/Studies: Dg Chest 2 View 12/30/2014   CLINICAL DATA:  Retained fluid, swollen belly and feet.  EXAM: CHEST  2 VIEW  COMPARISON:  10/11/2014  FINDINGS: Bilateral interstitial thickening with prominence of the central pulmonary vasculature. Trace bilateral pleural effusions. No pneumothorax. Stable cardiomegaly. There is a cardiac pacemaker present.  Osseous structures are unremarkable.  IMPRESSION: Cardiomegaly with pulmonary vascular congestion.   Electronically Signed   By: Elige Ko   On: 12/30/2014 11:59   US Abdomen Limited 02-04-15   CLINICAL DATA:  Ascites.  History of hepatic cirrhosis  EXAM: LIMITED ABDOMINAL ULTRASOUND  COMPARISON:  October 04, 2014  FINDINGS: There is moderate diffuse ascites throughout the abdomen with ascites noted in both upper and lower quadrants. There are multiple loops of peristalsing bowel as well appear  IMPRESSION: Moderate generalized ascites.   Electronically Signed   By: Bretta Bang III M.D.   On: 01/16/2015 17:53   EKG: sinus rhythm with ventricular pacing, QRS 232  TELEMETRY: atrial fibrillation with V pacing  DEVICE HISTORY: MDT dual chamber pacemaker implanted 2007; upgrade to CRTD 2010; gen change 2014.  Device interrogation reviewed today.  Normal device function. AF since 4/17 - see paper chart  Assessment/Plan: 1.  Atrial fibrillation Likely a consequence of heart failure as opposed to driving heart failure.  No good AAD options for maintaining SR.  LFT's had basically returned to normal last month.  Amiodarone is only option if it is felt that restoring SR will help with HF.  Monitor LFTs closely with resumption He is appropriately anticoagulated with Warfarin for CHADS2VASC score of 3  2.  Acute on chronic systolic heart failure/cardiogenic shock Pt currently on Norepi to maintain BP and requiring CVVHD.  It appears that his prognosis is very poor.  Management per AHF team.   3.  CKD Per nephrology   4.  LV thrombus Continue anticoagulation per pharmacy  5.  Ventricular tachycardia No recent therapies for VT. Continue Mexilitine   Signed, Gypsy Balsam, NP 01/21/2015 10:34 AM   I have seen, examined the patient, and reviewed the above assessment and plan.  Changes to above are made where necessary.  The patient appears critically ill with multiple chronic organs failed.  He prognosis is very poor.  He is now in afib.  I suspect that his afib is secondary to his acute decompensated condition.  Our ability to maintain sinus rhythm long term is quite low.  He is presently adequately rate controlled.  I doubt that attempts to achieve/ maintain sinus rhythm would be successful or productive.  Only AAD option is amiodarone.  I will defer to Dr Shirlee Latch as to whether or not this would be reasonable as a palliative option in this patient with cirrhosis.  Palliative options would seem appropriate.  Electrophysiology team to see as needed while  here. Please call with questions.   Co Sign: Hillis Range, MD 01/21/2015 4:58 PM

## 2015-01-21 NOTE — Progress Notes (Signed)
ANTICOAGULATION CONSULT NOTE - Follow Up Consult  Pharmacy Consult for  Heparin/Coumadin  Indication: LV apical thrombus  Allergies  Allergen Reactions  . Ace Inhibitors     AVOID ALL  BECAUSE OF HEART DISEASE   . Amiodarone     Abnormal LFTs (10/2014)  . Nitroglycerin Other (See Comments)    Reaction unknown  . Procan Sr [Procainamide Hcl]     PT. HAS HEART DISEASE CALLED HYPERTHROPHIC CARDIOMYOPATHY  . Quinidine     PT. HAS HEART DISEASE CALLED HYPERTHROPHIC CARDIOMYOPATHY      Patient Measurements: Height:  (185.4 cm) Weight: (!) 306 lb 3.5 oz (138.9 kg) IBW/kg (Calculated) : 79.9 Heparin Dosing Weight: 114 kg  Vital Signs: Temp: 98 F (36.7 C) (04/19 0800) Temp Source: Oral (04/19 0800) BP: 110/38 mmHg (04/19 0800) Pulse Rate: 58 (04/19 0800)  Labs:  Recent Labs  01/18/15 1447  01/19/15 0445  01/20/15 0400 01/20/15 1600 01/21/15 0515 01/21/15 0832  HGB  --   < > 7.6*  --  8.3*  --  8.5* 8.1*  HCT  --   < > 23.3*  --  25.6*  --  26.9* 25.2*  PLT  --   < > 73*  --  94*  --  139* 123*  APTT 67*  --  71*  --   --   --   --   --   LABPROT  --   < > 18.0*  --  21.4*  --  40.2* 40.4*  INR  --   < > 1.48  --  1.84*  --  4.12* 4.15*  HEPARINUNFRC  --   --  0.40  --  0.34  --  0.28*  --   CREATININE  --   --   --   < > 4.63* 4.29* 4.12*  --   < > = values in this interval not displayed.  Estimated Creatinine Clearance: 28.3 mL/min (by C-G formula based on Cr of 4.12).   Medical History: Past Medical History  Diagnosis Date  . HYPERLIPIDEMIA-MIXED   . Obesity, unspecified   . HYPERTENSION, UNSPECIFIED   . CAD (coronary artery disease)     a. Nonobstructive moderate CAD by cath - last 2012.  [name] Hypertrophic obstructive cardiomyopathy   . Complete heart block     a. s/p pacemaker later upgraded to CRT-D (MDT).  . CKD (chronic kidney disease), stage IV     a. Baseline Cr 1.4-1.5 prior to 09/2014-->2.3-2.8 during admission 09/2014.  [name] Chronic systolic CHF  (congestive heart failure)     a. NICM (EF 25% in 2012, 20-25% in 09/2013). b. s/p BiV-ICD (MDT);  c. 10/2014 EF 25%.  . Colon polyps   . Ventricular tachycardia     a. EPS/RFCA of VT in 2012. b. On amio, mexilitene. c. Last BiV-ICD gen change 05/2013 (MDT).  . Hypothyroidism   . Fatty liver disease, nonalcoholic   . BPH (benign prostatic hyperplasia)   . Habitual alcohol use     Quit Christmas  . Liver cirrhosis   . 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 DM     insulin dependent   . Left ventricular apical thrombus     a. 10/2014 Echo: 1.5x1.5 cm apical clot-->on eliquis.  [name] NICM (nonischemic cardiomyopathy)     a. 10/2014 Echo: EF 25%, diff HK, 1.5 x 1.5 cm apical clot, mod dil LA.    Assessment: 66 yom admitted with HF exacerbation and started  on milrinone.  He was on apixaban pta for LV apical thrombus.  He is CKD IV and probable soon HD candidate.  Apixaban is now on hold with last dose 4/13 am.   HL elevated d/t apixaban affects HL with false elevation.  Heparin levels now correlating with PTTs.    Heparin drip 1850 uts/hr, heparin level at goal.  INR with acute jump to 4.12, rechecked lab = 4.15. CBC stable, no bleeding noted.    Asked to begin Coumadin since apixaban not preferred due to worsening renal function.  Goal of Therapy:  INR 2-3 Monitor platelets by anticoagulation protocol: Yes   Plan:  D/c IV heparin No Coumadin today Begin Coumadin education when more appropriate. Daily INR.  Tad Moore, BCPS  Clinical Pharmacist Pager [phone]  01/21/2015 10:02 AM

## 2015-01-21 NOTE — Progress Notes (Signed)
Patient ID: [name], male   DOB: [birth year], 59 y.o.   MRN: [id number] S:c/o constipation O:BP 110/38 mmHg  Pulse 58  Temp(Src) 98 F (36.7 C) (Oral)  Resp 19  Ht 6\' 1"  (1.854 m)  Wt 138.9 kg (306 lb 3.5 oz)  BMI 40.41 kg/m2  SpO2 97%  Intake/Output Summary (Last 24 hours) at 01/21/15 0901 Last data filed at 01/21/15 0800  Gross per 24 hour  Intake 2171.81 ml  Output   3741 ml  Net -1569.19 ml   Intake/Output: I/O last 3 completed shifts: In: 2908.9 [P.O.:1060; I.V.:1848.9] Out: 5168 [Urine:90; Other:5078]  Intake/Output this shift:  Total I/O In: 392.8 [P.O.:360; I.V.:32.8] Out: 130 [Other:130] Weight change: -1.5 kg (-3 lb 4.9 oz) Gen:WD obese WM in NAd CVS:no rub Resp:decreased BS at bases, otherwise CTA NBV:APOLIDCVU, +Fluid wave Ext:3+ edema/anasarca   Recent Labs Lab 01-28-2015 1300  01/16/15 0430 01/17/15 0330 01/18/15 0345 01/19/15 1620 01/20/15 0400 01/20/15 1600 01/21/15 0515  NA 134*  < > 136 132*  133* 130* 130* 131* 130* 130*  K 6.6*  < > 5.4* 4.9  5.0 5.0 4.1 4.3 4.6 4.6  CL 104  < > 104 101  102 98 97 97 97 96  CO2 20  < > 22 19  20 19 22 23 23 25   GLUCOSE 68*  < > 90 128*  129* 93 121* 60* 128* 103*  BUN 77*  < > 76* 78*  77* 80* 48* 39* 35* 29*  CREATININE 4.05*  < > 4.32* 5.02*  5.02* 5.77* 5.11* 4.63* 4.29* 4.12*  ALBUMIN 2.7*  --   --  2.6*  --  2.6* 2.6* 2.7* 2.6*  CALCIUM 9.2  < > 8.9 8.8  8.9 8.8 8.6 8.5 8.6 8.6  PHOS  --   --   --  5.4*  --  5.1* 4.6 4.2 4.0  AST 58*  --   --   --   --   --   --   --   --   ALT 30  --   --   --   --   --   --   --   --   < > = values in this interval not displayed. Liver Function Tests:  Recent Labs Lab January 28, 2015 1300  01/20/15 0400 01/20/15 1600 01/21/15 0515  AST 58*  --   --   --   --   ALT 30  --   --   --   --   ALKPHOS 83  --   --   --   --   BILITOT 1.2  --   --   --   --   PROT 6.7  --   --   --   --   ALBUMIN 2.7*  < > 2.6* 2.7* 2.6*  < > = values in this interval not  displayed. No results for input(s): LIPASE, AMYLASE in the last 168 hours. No results for input(s): AMMONIA in the last 168 hours. CBC:  Recent Labs Lab 01/17/15 0330 01/18/15 0345 01/19/15 0445 01/20/15 0400 01/21/15 0515  WBC 5.4 6.3 6.0 7.3 15.7*  HGB 7.8* 7.8* 7.6* 8.3* 8.5*  HCT 23.4* 23.5* 23.3* 25.6* 26.9*  MCV 87.3 87.4 89.3 90.1 91.8  PLT 66* 75* 73* 94* 139*   Cardiac Enzymes:  Recent Labs Lab 28-Jan-2015 1300  TROPONINI 0.40*   CBG:  Recent Labs Lab 01/20/15 1206 01/20/15 1340 01/20/15 1556 01/20/15 2158 01/21/15 0805  GLUCAP 69* 99 120* 125* 83    Iron Studies: No results for input(s): IRON, TIBC, TRANSFERRIN, FERRITIN in the last 72 hours. Studies/Results: No results found. . darbepoetin (ARANESP) injection - DIALYSIS  100 mcg Intravenous Q Mon-HD  . insulin aspart  0-15 Units Subcutaneous TID WC  . insulin aspart  0-5 Units Subcutaneous QHS  . insulin aspart  16 Units Subcutaneous Q supper  . insulin glargine  50 Units Subcutaneous BID  . levothyroxine  150 mcg Oral QAC breakfast  . linagliptin  5 mg Oral Daily  . mexiletine  150 mg Oral TID  . rosuvastatin  10 mg Oral Daily  . sodium chloride  3 mL Intravenous Q12H  . tamsulosin  0.4 mg Oral Daily  . warfarin   Does not apply Once  . Warfarin - Pharmacist Dosing Inpatient   Does not apply q1800    BMET    Component Value Date/Time   NA 130* 01/21/2015 0515   NA 139 01/08/2015 1341   K 4.6 01/21/2015 0515   CL 96 01/21/2015 0515   CO2 25 01/21/2015 0515   GLUCOSE 103* 01/21/2015 0515   GLUCOSE 111* 01/08/2015 1341   BUN 29* 01/21/2015 0515   BUN 55* 01/08/2015 1341   CREATININE 4.12* 01/21/2015 0515   CREATININE 3.56* 01/13/2015 1230   CALCIUM 8.6 01/21/2015 0515   GFRNONAA 15* 01/21/2015 0515   GFRNONAA 50* 01/30/2013 1016   GFRAA 17* 01/21/2015 0515   GFRAA 58* 01/30/2013 1016   CBC    Component Value Date/Time   WBC 15.7* 01/21/2015 0515   WBC 4.3* 12/19/2014 1458   RBC  2.93* 01/21/2015 0515   RBC 4.01* 12/19/2014 1458   RBC 3.31* 10/06/2014 0744   HGB 8.5* 01/21/2015 0515   HGB 10.9* 12/19/2014 1458   HCT 26.9* 01/21/2015 0515   HCT 24.0* 01/16/2015 1520   HCT 35.1* 12/19/2014 1458   PLT 139* 01/21/2015 0515   MCV 91.8 01/21/2015 0515   MCV 87.5 12/19/2014 1458   MCH 29.0 01/21/2015 0515   MCH 27.1 12/19/2014 1458   MCHC 31.6 01/21/2015 0515   MCHC 31.0* 12/19/2014 1458   RDW 16.4* 01/21/2015 0515   LYMPHSABS 0.9 12/30/2014 1155   MONOABS 0.7 12/30/2014 1155   EOSABS 0.2 12/30/2014 1155   BASOSABS 0.0 12/30/2014 1155     Assessment/Plan: 1. AKI/CKD- in setting of decompensated CHF worrisome for cardiorenal syndrome or even hepatorenal. Remains oliguric/anuric despite trial of high dose IV lasix and has failed IHD due to hypotension. Started on CVVHD 01/19/15 but still requires pressors to maintain BP (at least able to wean milrinone and vasopressin, still on norepi gtt).  1. Will continue with CVVHDF for now, however if his BP does not improve with ultrafiltration, we may be dealing with a terminal process. 2. Recommend palliative care consult to help set goals/limits of care 3. Wean pressors as tolerated and cont to challenge UF 2. Acute on chronic systolic CHF- Appreciate Cardiology's assistance in management. He is currently on milrinone, norepi, and vasopressin gtts presumably due to cardiorenal syndrome and has failed to respond to high dose IV lasix and IHD (due to hypotension). 1. Continue with CVVHDF for now and wean pressors as able and UF as tolerated (hopefully will have better hemodynamics with UF, however longterm HD and management of his CHF and cirrhosis will be difficult) 3. Hyperkalemia- resolved with CVVHD 4. 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 Complicates management significantly 5. Constipation- will treat with lactulose daily  6. DM- per primary service 7. Anemia- likely due to chronic kidney disease. Will check iron  stores and may benefit from initiation of Aranesp. 8. CMP- EF of 25% will make longterm dialysis difficult 9. CAD- chest pain free, per cards 10. Protein malnutrition- likely related to cirrhosis. 11. Disposition- pt with multiple end-stage disease processes. Poor prognosis and not clear if he would be able to tolerate intermittent hemodialysis without pressor support.   Will follow response to ultrafiltration but if he remains pressor dependent, will need hospice involvement.  Jamy Whyte A

## 2015-01-21 NOTE — Progress Notes (Addendum)
   Device interrogated. He has been in A fib since 4/17 with multiple episodes of NSVT.   Consider amio drip but had elevated LFTs.  Check magnesium   Dr Shirlee Latch aware and agrees with plan. Will discuss with HF pharmacy had elevated LFTs in January.  Amio to start at 30 mg per hour. Follow LFTs.   CLEGG,AMY NP-C  9:05 AM

## 2015-01-21 NOTE — Progress Notes (Addendum)
Patient ID: [name], male   DOB: 08-16-1956, 59 y.o.   MRN: [id number]   SUBJECTIVE:   Remains on CVVHD (failed IHD due to low BP), UF at 120 cc/hr.  Milrinone and Vasopressin stopped. Remains on norepinephrine at 35 mcg.   Complaining of urinary urgency. Had 90 cc urine output last night.   Denies SOB.    CVP 24  Device interrogated, in atrial fibrillation since 4/17.    Scheduled Meds: . darbepoetin (ARANESP) injection - DIALYSIS  100 mcg Intravenous Q Mon-HD  . insulin aspart  0-15 Units Subcutaneous TID WC  . insulin aspart  0-5 Units Subcutaneous QHS  . insulin aspart  16 Units Subcutaneous Q supper  . insulin glargine  50 Units Subcutaneous BID  . levothyroxine  150 mcg Oral QAC breakfast  . linagliptin  5 mg Oral Daily  . mexiletine  150 mg Oral TID  . rosuvastatin  10 mg Oral Daily  . sodium chloride  3 mL Intravenous Q12H  . tamsulosin  0.4 mg Oral Daily  . warfarin   Does not apply Once  . Warfarin - Pharmacist Dosing Inpatient   Does not apply q1800   Continuous Infusions: . heparin 1,850 Units/hr (01/20/15 2300)  . norepinephrine (LEVOPHED) Adult infusion 35 mcg/min (01/21/15 0544)  . dialysis replacement fluid (prismasate) 200 mL/hr at 01/20/15 1403  . dialysis replacement fluid (prismasate) 200 mL/hr at 01/19/15 1231  . dialysate (PRISMASATE) 1,000 mL/hr at 01/21/15 0544  . vasopressin (PITRESSIN) infusion - *FOR SHOCK* Stopped (01/20/15 0907)   PRN Meds:.sodium chloride, acetaminophen, ALPRAZolam, heparin, heparin, ondansetron (ZOFRAN) IV, sodium chloride    Filed Vitals:   01/21/15 0505 01/21/15 0554 01/21/15 0600 01/21/15 0700  BP: 89/31 111/38 105/29 116/42  Pulse: 63 62 60 70  Temp:      TempSrc:      Resp:  $Remo'21 17 18  'SUKqA$ Height:      Weight:      SpO2: 100% 96% 97% 96%    Intake/Output Summary (Last 24 hours) at 01/21/15 0717 Last data filed at 01/21/15 0700  Gross per 24 hour  Intake 1942.16 ml  Output   3796 ml  Net -1853.84 ml     LABS: Basic Metabolic Panel:  Recent Labs  01/20/15 0400 01/20/15 1600 01/21/15 0515  NA 131* 130* 130*  K 4.3 4.6 4.6  CL 97 97 96  CO2 $Re'23 23 25  'EMG$ GLUCOSE 60* 128* 103*  BUN 39* 35* 29*  CREATININE 4.63* 4.29* 4.12*  CALCIUM 8.5 8.6 8.6  MG 2.1  --  2.3  PHOS 4.6 4.2 4.0   Liver Function Tests:  Recent Labs  01/20/15 1600 01/21/15 0515  ALBUMIN 2.7* 2.6*   No results for input(s): LIPASE, AMYLASE in the last 72 hours. CBC:  Recent Labs  01/20/15 0400 01/21/15 0515  WBC 7.3 15.7*  HGB 8.3* 8.5*  HCT 25.6* 26.9*  MCV 90.1 91.8  PLT 94* 139*   Cardiac Enzymes: No results for input(s): CKTOTAL, CKMB, CKMBINDEX, TROPONINI in the last 72 hours. BNP: Invalid input(s): POCBNP D-Dimer: No results for input(s): DDIMER in the last 72 hours. Hemoglobin A1C: No results for input(s): HGBA1C in the last 72 hours. Fasting Lipid Panel: No results for input(s): CHOL, HDL, LDLCALC, TRIG, CHOLHDL, LDLDIRECT in the last 72 hours. Thyroid Function Tests: No results for input(s): TSH, T4TOTAL, T3FREE, THYROIDAB in the last 72 hours.  Invalid input(s): FREET3 Anemia Panel: No results for input(s): VITAMINB12, FOLATE, FERRITIN, TIBC, IRON, RETICCTPCT  in the last 72 hours.  RADIOLOGY: Dg Chest 2 View  12/30/2014   CLINICAL DATA:  Retained fluid, swollen belly and feet.  EXAM: CHEST  2 VIEW  COMPARISON:  10/11/2014  FINDINGS: Bilateral interstitial thickening with prominence of the central pulmonary vasculature. Trace bilateral pleural effusions. No pneumothorax. Stable cardiomegaly. There is a cardiac pacemaker present.  Osseous structures are unremarkable.  IMPRESSION: Cardiomegaly with pulmonary vascular congestion.   Electronically Signed   By: Kathreen Devoid   On: 12/30/2014 11:59   US Abdomen Limited  01/20/2015   CLINICAL DATA:  Ascites.  History of hepatic cirrhosis  EXAM: LIMITED ABDOMINAL ULTRASOUND  COMPARISON:  October 04, 2014  FINDINGS: There is moderate diffuse  ascites throughout the abdomen with ascites noted in both upper and lower quadrants. There are multiple loops of peristalsing bowel as well appear  IMPRESSION: Moderate generalized ascites.   Electronically Signed   By: Lowella Grip III M.D.   On: 01/27/2015 17:53   Ir Fluoro Guide Cv Line Right  01/17/2015   INDICATION: 59 year old male with acute on chronic kidney disease. He has been referred for hemodialysis catheter placement.  EXAM: TUNNELED CENTRAL VENOUS HEMODIALYSIS CATHETER PLACEMENT WITH ULTRASOUND AND FLUOROSCOPIC GUIDANCE  MEDICATIONS: Ancef 2 gm IV; The IV antibiotic was given in an appropriate time interval prior to skin puncture.  CONTRAST:  None  ANESTHESIA/SEDATION: Versed 1.0 mg IV; Fentanyl 50 mcg IV  Total Moderate Sedation Time  Twenty-one minutes.  FLUOROSCOPY TIME:  Zero minutes 54 seconds.  COMPLICATIONS: None  PROCEDURE: Informed written consent was obtained from the patient after a discussion of the risks, benefits, and alternatives to treatment. Questions regarding the procedure were encouraged and answered. The right neck and chest were prepped with chlorhexidine in a sterile fashion, and a sterile drape was applied covering the operative field. Maximum barrier sterile technique with sterile gowns and gloves were used for the procedure. A timeout was performed prior to the initiation of the procedure.  After creating a small venotomy incision, a micropuncture kit was utilized to access the right internal jugular vein under direct, real-time ultrasound guidance after the overlying soft tissues were anesthetized with 1% lidocaine with epinephrine. Ultrasound image documentation was performed. The microwire was kinked to measure appropriate catheter length. A stiff glidewire was advanced to the level of the IVC and the micropuncture sheath was exchanged for a peel-away sheath. A split tear tunneled hemodialysis catheter measuring 19 cm from tip to cuff was tunneled in a retrograde  fashion from the anterior chest wall to the venotomy incision.  The catheter was then placed through the peel-away sheath with tips ultimately positioned within the superior aspect of the right atrium. Final catheter positioning was confirmed and documented with a spot radiographic image. The catheter aspirates and flushes normally. The catheter was flushed with appropriate volume heparin dwells.  The catheter exit site was secured with a 0-Prolene retention suture. The venotomy incision was closed with Dermabond and Steri-strips. Dressings were applied. The patient tolerated the procedure well without immediate post procedural complication.  IMPRESSION: Status post placement of right IJ approach tunneled hemodialysis catheter, split tip measuring 19 cm tip to cuff. Catheter ready for use.  Signed,  Dulcy Fanny. Earleen Newport, DO  [facility]  [facility]   Electronically Signed   By: Corrie Mckusick D.O.   On: 01/17/2015 17:54   Ir US Guide Vasc Access Right  01/17/2015   INDICATION: 58 year old male with acute on chronic kidney  disease. He has been referred for hemodialysis catheter placement.  EXAM: TUNNELED CENTRAL VENOUS HEMODIALYSIS CATHETER PLACEMENT WITH ULTRASOUND AND FLUOROSCOPIC GUIDANCE  MEDICATIONS: Ancef 2 gm IV; The IV antibiotic was given in an appropriate time interval prior to skin puncture.  CONTRAST:  None  ANESTHESIA/SEDATION: Versed 1.0 mg IV; Fentanyl 50 mcg IV  Total Moderate Sedation Time  Twenty-one minutes.  FLUOROSCOPY TIME:  Zero minutes 54 seconds.  COMPLICATIONS: None  PROCEDURE: Informed written consent was obtained from the patient after a discussion of the risks, benefits, and alternatives to treatment. Questions regarding the procedure were encouraged and answered. The right neck and chest were prepped with chlorhexidine in a sterile fashion, and a sterile drape was applied covering the operative field. Maximum barrier sterile technique with  sterile gowns and gloves were used for the procedure. A timeout was performed prior to the initiation of the procedure.  After creating a small venotomy incision, a micropuncture kit was utilized to access the right internal jugular vein under direct, real-time ultrasound guidance after the overlying soft tissues were anesthetized with 1% lidocaine with epinephrine. Ultrasound image documentation was performed. The microwire was kinked to measure appropriate catheter length. A stiff glidewire was advanced to the level of the IVC and the micropuncture sheath was exchanged for a peel-away sheath. A split tear tunneled hemodialysis catheter measuring 19 cm from tip to cuff was tunneled in a retrograde fashion from the anterior chest wall to the venotomy incision.  The catheter was then placed through the peel-away sheath with tips ultimately positioned within the superior aspect of the right atrium. Final catheter positioning was confirmed and documented with a spot radiographic image. The catheter aspirates and flushes normally. The catheter was flushed with appropriate volume heparin dwells.  The catheter exit site was secured with a 0-Prolene retention suture. The venotomy incision was closed with Dermabond and Steri-strips. Dressings were applied. The patient tolerated the procedure well without immediate post procedural complication.  IMPRESSION: Status post placement of right IJ approach tunneled hemodialysis catheter, split tip measuring 19 cm tip to cuff. Catheter ready for use.  Signed,  Dulcy Fanny. Earleen Newport, DO  [facility]  [facility]   Electronically Signed   By: Corrie Mckusick D.O.   On: 01/17/2015 17:54   Dg Chest Port 1 View  01/25/2015   CLINICAL DATA:  Central catheter placement  EXAM: PORTABLE CHEST - 1 VIEW  COMPARISON:  December 30, 2014  FINDINGS: Central catheter tip is in the superior vena cava. There are pacemaker leads in the right atrium and right  ventricle regions. No pneumothorax. Cardiomegaly with pulmonary vascular within normal limits is stable. Lungs are clear. No adenopathy.  IMPRESSION: Central catheter tip in superior vena cava. No pneumothorax. Cardiomegaly. No change in pacemaker lead placement. No edema or consolidation.   Electronically Signed   By: Lowella Grip III M.D.   On: 01/03/2015 14:22    PHYSICAL EXAM CVP 24  General: NAD Neck: JVP to jaw, no thyromegaly or thyroid nodule. RIJ TLJ necrotic narrow medial aspect. Ecchymosis noted R neck and chin.  R subclavian dialysis catheter  Lungs: Clear to auscultation bilaterally with normal respiratory effort. CV: PMI difficult to detect. Heart somewhat distant, regular S1/S2, no definite S3/S4, no murmur. 3+ edema to thighs, sacral edema. No carotid bruit. Unable to palpate pedal pulses due to swelling Abdomen: Soft, nontender, no hepatosplenomegaly, moderate distention.  Skin: Intact without lesions or rashes.  Neurologic: Alert and oriented x 3.  Psych: Normal affect. Extremities: No clubbing or cyanosis. R and LLE 3+ edema.  HEENT: Normal.   TELEMETRY:  v-paced 67  ASSESSMENT AND PLAN: 59 yo with history of nonobstructive CAD, nonischemic CMP with chronic systolic CHF, CKD IV, and history of LV apical thrombus presented with CHF. 1. Acute on chronic systolic CHF/cardiogenic shock: NYHA class IV symptoms with marked volume overload. Low output failure with cardiorenal syndrome. Echo showed EF 30-35% with mobile apical thrombus (known from past).   Now on CVVHD. Milrinone 0.25 mcg and Vasopressin 0.03 units stopped yesterday . Remains on norepinephrine at 35 mcg to maintain BP while getting CVVH.  Weight is down, continues UF at 120 cc/hr. - Continue UF as aggressively as tolerated.  - CO-OX adequate at  70%  2. CKD IV: Suspect cardiorenal syndrome. On CVVHD. (failed IHD) 3. H/o CHB: Has [employer] CRT-D device. Currently appears to be a-sensed/v-paced.  Will ask [employer] to interrogate his device.   4. Hyperkalemia: Improved with treatment.   5. H/o VT: Continue Mexiletine.  6. LV apical thrombus: On warfarin.  INR 4.1, will repeat. Stop heparin until INR verified.  7. Anemia: No active bleeding.  Send hemoccult.  May be anemia of renal disease.  8. Elevated WBC: Will repeat. Afebrile. Check UA as he is complaining of urgency.  Will get portable CXR.  9. Atrial fibrillation: Patient has been in atrial fibrillation since 4/17 per device interrogation today.  Will start amiodarone as low dose gtt to see if he will convert back over to NSR.  He has been anticoagulated (apixaban => heparin gtt => warfarin).    CLEGG,AMY NP-C  01/21/2015 7:17 AM   Patient seen with NP, agree with the above note.  He is stable this morning on norepinephrine gtt to maintain BP.  He is getting CVVH with UF 120 cc/hr.  Weight down.  CVP remains high.  Continue UF as aggressively as tolerated.  Long-term, must hope that hemodynamics improve with fluid removal.  If not, hospice/palliative care is most appropriate.  Ongoing HD would be difficult.   In atrial fibrillation since 4/17.  Will start amiodarone low dose gtt to see if he converts to NSR.  He has been anticoagulated (apixaban => heparin gtt => warfarin).   Loralie Champagne  01/21/2015 8:08 AM  Reviewed notes => there has been concern that Mr Nearhood has had amiodarone liver toxicity with cirrhosis.  Amiodarone was stopped and he was to have seen a liver specialist (per Dr Tanna Furry last note).  Will need to review this with Dr Lovena Le before starting amiodarone.  In the meantime, continue mexiletine.   Loralie Champagne 01/21/2015 9:35 AM

## 2015-01-22 DIAGNOSIS — Z66 Do not resuscitate: Secondary | ICD-10-CM

## 2015-01-22 DIAGNOSIS — Z992 Dependence on renal dialysis: Secondary | ICD-10-CM

## 2015-01-22 DIAGNOSIS — Z4509 Encounter for adjustment and management of other cardiac device: Secondary | ICD-10-CM

## 2015-01-22 DIAGNOSIS — Z4502 Encounter for adjustment and management of automatic implantable cardiac defibrillator: Secondary | ICD-10-CM

## 2015-01-22 DIAGNOSIS — R0600 Dyspnea, unspecified: Secondary | ICD-10-CM

## 2015-01-22 DIAGNOSIS — F411 Generalized anxiety disorder: Secondary | ICD-10-CM

## 2015-01-22 DIAGNOSIS — Z515 Encounter for palliative care: Secondary | ICD-10-CM

## 2015-01-22 DIAGNOSIS — R52 Pain, unspecified: Secondary | ICD-10-CM

## 2015-01-22 LAB — CBC WITH DIFFERENTIAL/PLATELET
BASOS ABS: 0 10*3/uL (ref 0.0–0.1)
BASOS PCT: 0 % (ref 0–1)
EOS ABS: 0.1 10*3/uL (ref 0.0–0.7)
Eosinophils Relative: 0 % (ref 0–5)
HCT: 26.9 % — ABNORMAL LOW (ref 39.0–52.0)
HEMOGLOBIN: 8.5 g/dL — AB (ref 13.0–17.0)
Lymphocytes Relative: 6 % — ABNORMAL LOW (ref 12–46)
Lymphs Abs: 1 10*3/uL (ref 0.7–4.0)
MCH: 29.3 pg (ref 26.0–34.0)
MCHC: 31.6 g/dL (ref 30.0–36.0)
MCV: 92.8 fL (ref 78.0–100.0)
MONOS PCT: 13 % — AB (ref 3–12)
Monocytes Absolute: 2.1 10*3/uL — ABNORMAL HIGH (ref 0.1–1.0)
NEUTROS PCT: 81 % — AB (ref 43–77)
Neutro Abs: 13 10*3/uL — ABNORMAL HIGH (ref 1.7–7.7)
PLATELETS: 98 10*3/uL — AB (ref 150–400)
RBC: 2.9 MIL/uL — ABNORMAL LOW (ref 4.22–5.81)
RDW: 16.8 % — AB (ref 11.5–15.5)
WBC: 16.1 10*3/uL — ABNORMAL HIGH (ref 4.0–10.5)

## 2015-01-22 LAB — GLUCOSE, CAPILLARY
GLUCOSE-CAPILLARY: 54 mg/dL — AB (ref 70–99)
GLUCOSE-CAPILLARY: 109 mg/dL — AB (ref 70–99)
Glucose-Capillary: 94 mg/dL (ref 70–99)

## 2015-01-22 LAB — PROTIME-INR
INR: 6.39 (ref 0.00–1.49)
PROTHROMBIN TIME: 56.6 s — AB (ref 11.6–15.2)

## 2015-01-22 LAB — BLOOD GAS, ARTERIAL
Acid-base deficit: 1.2 mmol/L (ref 0.0–2.0)
BICARBONATE: 23.2 meq/L (ref 20.0–24.0)
Drawn by: 12971
O2 Content: 3 L/min
O2 Saturation: 97.4 %
PCO2 ART: 40.2 mmHg (ref 35.0–45.0)
Patient temperature: 98.6
TCO2: 24.5 mmol/L (ref 0–100)
pH, Arterial: 7.38 (ref 7.350–7.450)
pO2, Arterial: 91 mmHg (ref 80.0–100.0)

## 2015-01-22 LAB — CARBOXYHEMOGLOBIN
CARBOXYHEMOGLOBIN: 2 % — AB (ref 0.5–1.5)
METHEMOGLOBIN: 1.1 % (ref 0.0–1.5)
O2 Saturation: 62.2 %

## 2015-01-22 LAB — CBC
HEMATOCRIT: 19 % — AB (ref 39.0–52.0)
Hemoglobin: 6 g/dL — CL (ref 13.0–17.0)
MCH: 29.1 pg (ref 26.0–34.0)
MCHC: 31.6 g/dL (ref 30.0–36.0)
MCV: 92.2 fL (ref 78.0–100.0)
Platelets: 64 10*3/uL — ABNORMAL LOW (ref 150–400)
RBC: 2.06 MIL/uL — ABNORMAL LOW (ref 4.22–5.81)
RDW: 16.8 % — AB (ref 11.5–15.5)
WBC: 11.7 10*3/uL — ABNORMAL HIGH (ref 4.0–10.5)

## 2015-01-22 LAB — RENAL FUNCTION PANEL
Albumin: 2.3 g/dL — ABNORMAL LOW (ref 3.5–5.2)
Anion gap: 9 (ref 5–15)
BUN: 23 mg/dL (ref 6–23)
CO2: 22 mmol/L (ref 19–32)
Calcium: 7.5 mg/dL — ABNORMAL LOW (ref 8.4–10.5)
Chloride: 103 mmol/L (ref 96–112)
Creatinine, Ser: 3.37 mg/dL — ABNORMAL HIGH (ref 0.50–1.35)
GFR, EST AFRICAN AMERICAN: 21 mL/min — AB (ref >90)
GFR, EST NON AFRICAN AMERICAN: 19 mL/min — AB (ref >90)
GLUCOSE: 44 mg/dL — AB (ref 70–99)
POTASSIUM: 4.1 mmol/L (ref 3.5–5.1)
Phosphorus: 3.5 mg/dL (ref 2.3–4.6)
SODIUM: 134 mmol/L — AB (ref 135–145)

## 2015-01-22 LAB — MAGNESIUM: MAGNESIUM: 2.4 mg/dL (ref 1.5–2.5)

## 2015-01-22 LAB — URINALYSIS, ROUTINE W REFLEX MICROSCOPIC
GLUCOSE, UA: 100 mg/dL — AB
KETONES UR: 15 mg/dL — AB
Nitrite: POSITIVE — AB
Specific Gravity, Urine: 1.025 (ref 1.005–1.030)
Urobilinogen, UA: 0.2 mg/dL (ref 0.0–1.0)
pH: 5 (ref 5.0–8.0)

## 2015-01-22 LAB — AMMONIA: Ammonia: 44 umol/L — ABNORMAL HIGH (ref 11–32)

## 2015-01-22 LAB — URINE MICROSCOPIC-ADD ON

## 2015-01-22 LAB — HEPATIC FUNCTION PANEL
ALT: 17 U/L (ref 0–53)
AST: 45 U/L — ABNORMAL HIGH (ref 0–37)
Albumin: 2.3 g/dL — ABNORMAL LOW (ref 3.5–5.2)
Alkaline Phosphatase: 74 U/L (ref 39–117)
BILIRUBIN DIRECT: 0.3 mg/dL (ref 0.0–0.5)
Indirect Bilirubin: 0.6 mg/dL (ref 0.3–0.9)
TOTAL PROTEIN: 5.8 g/dL — AB (ref 6.0–8.3)
Total Bilirubin: 0.9 mg/dL (ref 0.3–1.2)

## 2015-01-22 LAB — ABO/RH: ABO/RH(D): O POS

## 2015-01-22 LAB — PREPARE RBC (CROSSMATCH)

## 2015-01-22 MED ORDER — PIPERACILLIN-TAZOBACTAM 3.375 G IVPB
3.3750 g | Freq: Three times a day (TID) | INTRAVENOUS | Status: DC
  Filled 2015-01-22 (×2): qty 50

## 2015-01-22 MED ORDER — PIPERACILLIN-TAZOBACTAM 3.375 G IVPB
3.3750 g | Freq: Four times a day (QID) | INTRAVENOUS | Status: DC
  Filled 2015-01-22 (×2): qty 50

## 2015-01-22 MED ORDER — PIPERACILLIN-TAZOBACTAM 3.375 G IVPB 30 MIN
3.3750 g | Freq: Four times a day (QID) | INTRAVENOUS | Status: DC
  Filled 2015-01-22 (×4): qty 50

## 2015-01-22 MED ORDER — MIDAZOLAM HCL 5 MG/ML IJ SOLN
2.0000 mg/h | INTRAMUSCULAR | Status: DC
  Administered 2015-01-22: 2 mg/h via INTRAVENOUS
  Filled 2015-01-22: qty 10

## 2015-01-22 MED ORDER — GLYCOPYRROLATE 0.2 MG/ML IJ SOLN
0.2000 mg | INTRAMUSCULAR | Status: DC | PRN
  Filled 2015-01-22: qty 1

## 2015-01-22 MED ORDER — FUROSEMIDE 10 MG/ML IJ SOLN
20.0000 mg | Freq: Once | INTRAMUSCULAR | Status: AC
  Administered 2015-01-22: 20 mg via INTRAVENOUS
  Filled 2015-01-22: qty 2

## 2015-01-22 MED ORDER — LORAZEPAM 2 MG/ML IJ SOLN: 1.0000 mg | INTRAMUSCULAR | Status: DC | PRN

## 2015-01-22 MED ORDER — SODIUM CHLORIDE 0.9 % IV SOLN: Freq: Once | INTRAVENOUS | Status: DC

## 2015-01-22 MED ORDER — MORPHINE SULFATE 2 MG/ML IJ SOLN
1.0000 mg | INTRAMUSCULAR | Status: DC | PRN
  Administered 2015-01-22 (×2): 2 mg via INTRAVENOUS
  Filled 2015-01-22 (×2): qty 1

## 2015-01-22 MED ORDER — LORAZEPAM 2 MG/ML IJ SOLN
0.5000 mg | INTRAMUSCULAR | Status: DC | PRN
  Administered 2015-01-22: 1 mg via INTRAVENOUS
  Filled 2015-01-22: qty 1

## 2015-01-22 MED ORDER — SODIUM CHLORIDE 0.9 % IV SOLN
5.0000 mg/h | INTRAVENOUS | Status: DC
  Administered 2015-01-22: 5 mg/h via INTRAVENOUS
  Filled 2015-01-22: qty 10

## 2015-01-22 MED ORDER — PANTOPRAZOLE SODIUM 40 MG PO TBEC
40.0000 mg | DELAYED_RELEASE_TABLET | Freq: Two times a day (BID) | ORAL | Status: DC
Start: 2015-01-22 — End: 2015-01-22
  Administered 2015-01-22: 40 mg via ORAL
  Filled 2015-01-22: qty 1

## 2015-01-22 MED ORDER — PHYTONADIONE 5 MG PO TABS
2.5000 mg | ORAL_TABLET | Freq: Once | ORAL | Status: AC
  Administered 2015-01-22: 2.5 mg via ORAL
  Filled 2015-01-22: qty 1

## 2015-01-22 NOTE — Progress Notes (Signed)
Inpatient Diabetes Program Recommendations  AACE/ADA: New Consensus Statement on Inpatient Glycemic Control (2013)  Target Ranges:  Prepandial:   less than 140 mg/dL      Peak postprandial:   less than 180 mg/dL (1-2 hours)      Critically ill patients:  140 - 180 mg/dL  Results for NADIM, ANDRETTA (MRN [id number]) as of 01/22/2015 10:30  Ref. Range 01/21/2015 21:32 01/22/2015 06:50 01/22/2015 07:38  Glucose-Capillary Latest Ref Range: 70-99 mg/dL 75 54 (L) 94   Inpatient Diabetes Program Recommendations Insulin - Basal: Decrease Lantus to 40 units BID   Thank you  Piedad Climes BSN, RN,CDE Inpatient Diabetes Coordinator [phone] (team pager)

## 2015-01-22 NOTE — Progress Notes (Signed)
Spoke with Dr Tresa Endo on call for Cardiology. Ok to titrate levo off every 30 mins. Family at bedside pt currently on Morphine and versed gtt. Will continue to monitor.

## 2015-01-22 NOTE — Progress Notes (Addendum)
This RN pulled a 2 mg vial of Ativan  and  2mg  vial of morphine from pyxis in order to given .5mg  Ativan and 1mg  Morphine. Witness RN unavailable at the time, so this RN chose to waste later.  After assessing pt again, RN gave 1mg  Ativan and 2mg  morphine. This is charted in the [facility]. This RN attempted to document this pyxis with Gillis Ends RN. The Pyxis still shows there is undocumented waste. This RN called pharmacy and was advised to write this progress note.

## 2015-01-22 NOTE — Progress Notes (Signed)
PT Cancellation Note  Patient Details Name: [name] MRN: [id number] DOB: Jan 08, 1956   Cancelled Treatment:    Reason Eval/Treat Not Completed: Medical issues which prohibited therapy, INR 6.39 today. Will continue to check back.   Corrie Brannen, [place] 01/22/2015, 7:50 AM

## 2015-01-22 NOTE — Progress Notes (Signed)
   Palliative Care consult appreciated. Now DNR and transitioning to comfort care. Deactivate ICD. Order placed and medtr[employer] notified.   Stop CVVHD. Dr Marval Regal aware and agrees with full comfort care.   Slowly start to wean levo. Add prn morphine and ativan.   CLEGG,AMY NP-C  2:11 PM  Patient seen and examined with Darrick Grinder, NP. We discussed all aspects of the encounter. I agree with the assessment and plan as stated above.   Patient now off CVVHD. He is dyspneic and agitated at times. Family has met with Palliative Care team. Plan is for full comfort care. Will stop all nonessential meds. Start morphine and versed gtts. Titrate as needed to ensure comfort. His family is aware that he will likely pass in the next 24 hours.  Quillian Quince Matelyn Antonelli,MD 4:42 PM

## 2015-01-22 NOTE — Progress Notes (Addendum)
ANTICOAGULATION CONSULT NOTE - Follow Up Consult  Pharmacy Consult for  Heparin/Coumadin  Indication: LV apical thrombus  Allergies  Allergen Reactions  . Ace Inhibitors     AVOID ALL  BECAUSE OF HEART DISEASE   . Amiodarone     Abnormal LFTs (10/2014)  . Nitroglycerin Other (See Comments)    Reaction unknown  . Procan Sr [Procainamide Hcl]     PT. HAS HEART DISEASE CALLED HYPERTHROPHIC CARDIOMYOPATHY  . Quinidine     PT. HAS HEART DISEASE CALLED HYPERTHROPHIC CARDIOMYOPATHY      Patient Measurements: Height:  (185.4 cm) Weight: 299 lb 6.2 oz (135.8 kg) IBW/kg (Calculated) : 79.9 Heparin Dosing Weight: 114 kg  Vital Signs: Temp: 97.3 F (36.3 C) (04/20 0404) Temp Source: Oral (04/20 0404) BP: 119/30 mmHg (04/20 0700) Pulse Rate: 61 (04/20 0700)  Labs:  Recent Labs  01/20/15 0400  01/21/15 0515 01/21/15 0832 01/21/15 1600 01/22/15 0530  HGB 8.3*  --  8.5* 8.1*  --  6.0*  HCT 25.6*  --  26.9* 25.2*  --  19.0*  PLT 94*  --  139* 123*  --  64*  LABPROT 21.4*  --  40.2* 40.4*  --  56.6*  INR 1.84*  --  4.12* 4.15*  --  6.39*  HEPARINUNFRC 0.34  --  0.28*  --   --   --   CREATININE 4.63*  < > 4.12*  --  4.09* 3.37*  < > = values in this interval not displayed.  Estimated Creatinine Clearance: 34.2 mL/min (by C-G formula based on Cr of 3.37).   Medical History: Past Medical History  Diagnosis Date  . HYPERLIPIDEMIA-MIXED   . Obesity, unspecified   . HYPERTENSION, UNSPECIFIED   . CAD (coronary artery disease)     a. Nonobstructive moderate CAD by cath - last 2012.  [name] Hypertrophic obstructive cardiomyopathy   . Complete heart block     a. s/p pacemaker later upgraded to CRT-D (MDT).  . CKD (chronic kidney disease), stage IV     a. Baseline Cr 1.4-1.5 prior to 09/2014-->2.3-2.8 during admission 09/2014.  [name] Chronic systolic CHF (congestive heart failure)     a. NICM (EF 25% in 2012, 20-25% in 09/2013). b. s/p BiV-ICD (MDT);  c. 10/2014 EF 25%.  . Colon  polyps   . Ventricular tachycardia     a. EPS/RFCA of VT in 2012. b. On amio, mexilitene. c. Last BiV-ICD gen change 05/2013 (MDT).  . Hypothyroidism   . Fatty liver disease, nonalcoholic   . BPH (benign prostatic hyperplasia)   . Habitual alcohol use     Quit Christmas  . Liver cirrhosis   . 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 DM     insulin dependent   . Left ventricular apical thrombus     a. 10/2014 Echo: 1.5x1.5 cm apical clot-->on eliquis.  [name] NICM (nonischemic cardiomyopathy)     a. 10/2014 Echo: EF 25%, diff HK, 1.5 x 1.5 cm apical clot, mod dil LA.    Assessment: 29 yom admitted with HF exacerbation and started on milrinone.  He was on apixaban pta for LV apical thrombus.  He is CKD IV and probable soon HD candidate.  Apixaban is now on hold with last dose 4/13 am.   HL elevated d/t apixaban affects HL with false elevation.  Asked to begin Coumadin since apixaban not preferred due to worsening renal function. Patient has received 3 doses of coumadin and now INR  is 6.49. No bleeding noted per nursing overnight. Original hgb resulted at 6 this am repeat was stable at 8.5. Pltc down this am to 98, heparin stopped 4/18.   Will hold coumadin for now and low dose vitamin k was ordered by MD.   Goal of Therapy:  INR 2-3 Monitor platelets by anticoagulation protocol: Yes   Plan:  No Coumadin today Begin Coumadin education when more appropriate. Daily INR.  Sheppard Coil PharmD., BCPS Clinical Pharmacist Pager [phone] 01/22/2015 11:06 AM  Pressor requirements increasing, lethargic with dirty u/a. Concern for urosepsis, will start IV zosyn. Patient continues on CRRT will dose accordingly. 01/22/2015 12:58 PM

## 2015-01-22 NOTE — Progress Notes (Signed)
Hgb 6.0 and INR 6.3.  HF team aware and 2 U PRBCs ordered.  Await HF team agreement.

## 2015-01-22 NOTE — Progress Notes (Addendum)
Patient ID: [name], male   DOB: [birth year], 59 y.o.   MRN: [id number]   SUBJECTIVE:   Remains on CVVHD (failed IHD due to low BP), UF at 120 cc/hr.  Milrinone and Vasopressin stopped. Remains on norepinephrine at 37 mcg. Hgb down to 6.0. INR 6.39. Has been off anticoagulants.   Denies SOB.    Device interrogated, in atrial fibrillation since 4/17.    Scheduled Meds: . sodium chloride   Intravenous Once  . antiseptic oral rinse  7 mL Mouth Rinse BID  . darbepoetin (ARANESP) injection - DIALYSIS  100 mcg Intravenous Q Mon-HD  . furosemide  20 mg Intravenous Once  . insulin aspart  0-15 Units Subcutaneous TID WC  . insulin aspart  0-5 Units Subcutaneous QHS  . insulin aspart  16 Units Subcutaneous Q supper  . insulin glargine  50 Units Subcutaneous BID  . lactulose  20 g Oral Daily  . levothyroxine  150 mcg Oral QAC breakfast  . linagliptin  5 mg Oral Daily  . mexiletine  150 mg Oral TID  . rosuvastatin  10 mg Oral Daily  . sodium chloride  3 mL Intravenous Q12H  . tamsulosin  0.4 mg Oral Daily  . warfarin   Does not apply Once  . Warfarin - Pharmacist Dosing Inpatient   Does not apply q1800   Continuous Infusions: . norepinephrine (LEVOPHED) Adult infusion 37 mcg/min (01/21/15 2200)  . dialysis replacement fluid (prismasate) 200 mL/hr at 01/21/15 1625  . dialysis replacement fluid (prismasate) 200 mL/hr at 01/21/15 1625  . dialysate (PRISMASATE) 1,000 mL/hr at 01/22/15 0300  . vasopressin (PITRESSIN) infusion - *FOR SHOCK* Stopped (01/20/15 0907)   PRN Meds:.sodium chloride, acetaminophen, ALPRAZolam, heparin, heparin, ondansetron (ZOFRAN) IV, sodium chloride    Filed Vitals:   01/22/15 0500 01/22/15 0515 01/22/15 0603 01/22/15 0700  BP:  102/31 142/68 119/30  Pulse:  70 64 61  Temp:      TempSrc:      Resp:  $Remo'23 21 22  'CiRZi$ Height:      Weight: 299 lb 6.2 oz (135.8 kg)     SpO2:  95% 97% 100%    Intake/Output Summary (Last 24 hours) at 01/22/15 8338 Last data  filed at 01/22/15 0700  Gross per 24 hour  Intake 2024.3 ml  Output   3630 ml  Net -1605.7 ml    LABS: Basic Metabolic Panel:  Recent Labs  01/21/15 0515 01/21/15 1600 01/22/15 0530  NA 130* 129* 134*  K 4.6 5.0 4.1  CL 96 97 103  CO2 $Re'25 25 22  'iAr$ GLUCOSE 103* 135* 44*  BUN 29* 28* 23  CREATININE 4.12* 4.09* 3.37*  CALCIUM 8.6 8.5 7.5*  MG 2.3  --  2.4  PHOS 4.0 4.2 3.5   Liver Function Tests:  Recent Labs  01/21/15 1600 01/22/15 0530  ALBUMIN 2.6* 2.3*   No results for input(s): LIPASE, AMYLASE in the last 72 hours. CBC:  Recent Labs  01/21/15 0832 01/22/15 0530  WBC 13.8* 11.7*  HGB 8.1* 6.0*  HCT 25.2* 19.0*  MCV 91.6 92.2  PLT 123* 64*   Cardiac Enzymes: No results for input(s): CKTOTAL, CKMB, CKMBINDEX, TROPONINI in the last 72 hours. BNP: Invalid input(s): POCBNP D-Dimer: No results for input(s): DDIMER in the last 72 hours. Hemoglobin A1C: No results for input(s): HGBA1C in the last 72 hours. Fasting Lipid Panel: No results for input(s): CHOL, HDL, LDLCALC, TRIG, CHOLHDL, LDLDIRECT in the last 72 hours. Thyroid Function Tests: No results  for input(s): TSH, T4TOTAL, T3FREE, THYROIDAB in the last 72 hours.  Invalid input(s): FREET3 Anemia Panel: No results for input(s): VITAMINB12, FOLATE, FERRITIN, TIBC, IRON, RETICCTPCT in the last 72 hours.  RADIOLOGY: Dg Chest 2 View  12/30/2014   CLINICAL DATA:  Retained fluid, swollen belly and feet.  EXAM: CHEST  2 VIEW  COMPARISON:  10/11/2014  FINDINGS: Bilateral interstitial thickening with prominence of the central pulmonary vasculature. Trace bilateral pleural effusions. No pneumothorax. Stable cardiomegaly. There is a cardiac pacemaker present.  Osseous structures are unremarkable.  IMPRESSION: Cardiomegaly with pulmonary vascular congestion.   Electronically Signed   By: Kathreen Devoid   On: 12/30/2014 11:59   US Abdomen Limited  01/22/2015   CLINICAL DATA:  Ascites.  History of hepatic cirrhosis   EXAM: LIMITED ABDOMINAL ULTRASOUND  COMPARISON:  October 04, 2014  FINDINGS: There is moderate diffuse ascites throughout the abdomen with ascites noted in both upper and lower quadrants. There are multiple loops of peristalsing bowel as well appear  IMPRESSION: Moderate generalized ascites.   Electronically Signed   By: Lowella Grip III M.D.   On: 01/20/2015 17:53   Ir Fluoro Guide Cv Line Right  01/17/2015   INDICATION: 59 year old male with acute on chronic kidney disease. He has been referred for hemodialysis catheter placement.  EXAM: TUNNELED CENTRAL VENOUS HEMODIALYSIS CATHETER PLACEMENT WITH ULTRASOUND AND FLUOROSCOPIC GUIDANCE  MEDICATIONS: Ancef 2 gm IV; The IV antibiotic was given in an appropriate time interval prior to skin puncture.  CONTRAST:  None  ANESTHESIA/SEDATION: Versed 1.0 mg IV; Fentanyl 50 mcg IV  Total Moderate Sedation Time  Twenty-one minutes.  FLUOROSCOPY TIME:  Zero minutes 54 seconds.  COMPLICATIONS: None  PROCEDURE: Informed written consent was obtained from the patient after a discussion of the risks, benefits, and alternatives to treatment. Questions regarding the procedure were encouraged and answered. The right neck and chest were prepped with chlorhexidine in a sterile fashion, and a sterile drape was applied covering the operative field. Maximum barrier sterile technique with sterile gowns and gloves were used for the procedure. A timeout was performed prior to the initiation of the procedure.  After creating a small venotomy incision, a micropuncture kit was utilized to access the right internal jugular vein under direct, real-time ultrasound guidance after the overlying soft tissues were anesthetized with 1% lidocaine with epinephrine. Ultrasound image documentation was performed. The microwire was kinked to measure appropriate catheter length. A stiff glidewire was advanced to the level of the IVC and the micropuncture sheath was exchanged for a peel-away sheath. A  split tear tunneled hemodialysis catheter measuring 19 cm from tip to cuff was tunneled in a retrograde fashion from the anterior chest wall to the venotomy incision.  The catheter was then placed through the peel-away sheath with tips ultimately positioned within the superior aspect of the right atrium. Final catheter positioning was confirmed and documented with a spot radiographic image. The catheter aspirates and flushes normally. The catheter was flushed with appropriate volume heparin dwells.  The catheter exit site was secured with a 0-Prolene retention suture. The venotomy incision was closed with Dermabond and Steri-strips. Dressings were applied. The patient tolerated the procedure well without immediate post procedural complication.  IMPRESSION: Status post placement of right IJ approach tunneled hemodialysis catheter, split tip measuring 19 cm tip to cuff. Catheter ready for use.  Signed,  Dulcy Fanny. Earleen Newport, DO  [facility]  [facility]   Electronically Signed   By: York Cerise  Earleen Newport D.O.   On: 01/17/2015 17:54   Ir US Guide Vasc Access Right  01/17/2015   INDICATION: 59 year old male with acute on chronic kidney disease. He has been referred for hemodialysis catheter placement.  EXAM: TUNNELED CENTRAL VENOUS HEMODIALYSIS CATHETER PLACEMENT WITH ULTRASOUND AND FLUOROSCOPIC GUIDANCE  MEDICATIONS: Ancef 2 gm IV; The IV antibiotic was given in an appropriate time interval prior to skin puncture.  CONTRAST:  None  ANESTHESIA/SEDATION: Versed 1.0 mg IV; Fentanyl 50 mcg IV  Total Moderate Sedation Time  Twenty-one minutes.  FLUOROSCOPY TIME:  Zero minutes 54 seconds.  COMPLICATIONS: None  PROCEDURE: Informed written consent was obtained from the patient after a discussion of the risks, benefits, and alternatives to treatment. Questions regarding the procedure were encouraged and answered. The right neck and chest were prepped with chlorhexidine in a sterile fashion,  and a sterile drape was applied covering the operative field. Maximum barrier sterile technique with sterile gowns and gloves were used for the procedure. A timeout was performed prior to the initiation of the procedure.  After creating a small venotomy incision, a micropuncture kit was utilized to access the right internal jugular vein under direct, real-time ultrasound guidance after the overlying soft tissues were anesthetized with 1% lidocaine with epinephrine. Ultrasound image documentation was performed. The microwire was kinked to measure appropriate catheter length. A stiff glidewire was advanced to the level of the IVC and the micropuncture sheath was exchanged for a peel-away sheath. A split tear tunneled hemodialysis catheter measuring 19 cm from tip to cuff was tunneled in a retrograde fashion from the anterior chest wall to the venotomy incision.  The catheter was then placed through the peel-away sheath with tips ultimately positioned within the superior aspect of the right atrium. Final catheter positioning was confirmed and documented with a spot radiographic image. The catheter aspirates and flushes normally. The catheter was flushed with appropriate volume heparin dwells.  The catheter exit site was secured with a 0-Prolene retention suture. The venotomy incision was closed with Dermabond and Steri-strips. Dressings were applied. The patient tolerated the procedure well without immediate post procedural complication.  IMPRESSION: Status post placement of right IJ approach tunneled hemodialysis catheter, split tip measuring 19 cm tip to cuff. Catheter ready for use.  Signed,  Dulcy Fanny. Earleen Newport, DO  [facility]  [facility]   Electronically Signed   By: Corrie Mckusick D.O.   On: 01/17/2015 17:54   Dg Chest Port 1 View  01/21/2015   CLINICAL DATA:  Pneumonia.  EXAM: PORTABLE CHEST - 1 VIEW  COMPARISON:  01/13/2015.  FINDINGS: Stable enlarged cardiac  silhouette. Stable right jugular catheter. Interval second large bore right jugular catheter with its tip obscured by an overlying cardiac lead, in the region of the inferior portion of the superior vena cava. No pneumothorax. Stable left subclavian pacer and AICD leads. The pulmonary vasculature remains mildly prominent. Interval mild left basilar atelectasis.  IMPRESSION: 1. Interval second right jugular catheter with its tip in the inferior aspect of the superior vena cava. 2. Interval mild left basilar atelectasis. 3. Stable cardiomegaly and pulmonary vascular congestion.   Electronically Signed   By: Claudie Revering M.D.   On: 01/21/2015 09:53   Dg Chest Port 1 View  01/24/2015   CLINICAL DATA:  Central catheter placement  EXAM: PORTABLE CHEST - 1 VIEW  COMPARISON:  December 30, 2014  FINDINGS: Central catheter tip is in the superior vena cava. There are pacemaker leads in the  right atrium and right ventricle regions. No pneumothorax. Cardiomegaly with pulmonary vascular within normal limits is stable. Lungs are clear. No adenopathy.  IMPRESSION: Central catheter tip in superior vena cava. No pneumothorax. Cardiomegaly. No change in pacemaker lead placement. No edema or consolidation.   Electronically Signed   By: Lowella Grip III M.D.   On: 01/12/2015 14:22    PHYSICAL EXAM CVP 26 General: NAD Neck: JVP to jaw, no thyromegaly or thyroid nodule. RIJ TLJ necrotic narrow medial aspect. Ecchymosis noted R neck and chin.  R subclavian dialysis catheter  Lungs: Clear to auscultation bilaterally with normal respiratory effort. CV: PMI difficult to detect. Heart somewhat distant, regular S1/S2, no definite S3/S4, no murmur. 3+ edema to thighs, sacral edema. No carotid bruit. Unable to palpate pedal pulses due to swelling Abdomen: Soft, nontender, no hepatosplenomegaly, moderate distention.  Skin: Intact without lesions or rashes.  Neurologic: Alert and oriented x 3.  Psych: Normal  affect. Extremities: No clubbing or cyanosis. R and LLE 3+ edema.  HEENT: Normal.   TELEMETRY:  v-paced 70, underlying atrial fibrillation  ASSESSMENT AND PLAN: 59 yo with history of nonobstructive CAD, nonischemic CMP with chronic systolic CHF, CKD IV, and history of LV apical thrombus presented with CHF. 1. Acute on chronic systolic CHF/cardiogenic shock: NYHA class IV symptoms with marked volume overload. Low output failure with cardiorenal syndrome. Echo showed EF 30-35% with mobile apical thrombus (known from past).   Now on CVVHD. Milrinone 0.25 mcg and Vasopressin 0.03 units stopped yesterday . Remains on norepinephrine at 37 mcg to maintain BP while getting CVVH.  Weight is down, continues UF at 100 cc/hr. - Continue UF as aggressively as tolerated.  - CO-OX adequate at  62%  2. CKD IV: Suspect cardiorenal syndrome. On CVVHD. (failed IHD) 3. H/o CHB: Has [employer] CRT-D device.  4. Hyperkalemia: Improved with treatment.   5. H/o VT: Continue Mexiletine.  6. LV apical thrombus: On warfarin.  INR 6.39. Stop heparin until INR verified.  7. Anemia: Hgb down to 6.0. Repeat CBC now. Give Vit K 2.5 mg.  No active bleeding.  Send hemoccult. Give 2UPRBCs. 8. Elevated WBC: Will repeat. Afebrile. Check UA as he is complaining of urgency.  Will get portable CXR.  9. Atrial fibrillation: Patient has been in atrial fibrillation since 4/17 per device interrogation today.  HR controlled, suspect that getting him back in NSR will not make much difference here and will be hard to hold.  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    CLEGG,AMY NP-C  01/22/2015 7:12 AM   Patient seen with NP, agree with the above note.  Co-ox adequate, CVP remains 25.  Weight coming down with CVVH but still quite volume overloaded.  Remains on norepinephrine.  Hemoglobin down to 6 this morning, no active bleeding noted.  INR 6.  Will give vitamin K 2.5 mg po and 2 units  PRBCs.   Awaiting stool guaiac, add Protonix.  He would be high risk currently (on pressor) for GI procedure.    Will avoid amiodarone with history of toxicity.  Remains in atrial fibrillation but paced rate controlled around 70 likely due to complete heart block.    Poor prognosis if he does not have hemodynamic recovery with fluid removal.  Poor candidate for long-term HD.  Will involve palliative care service.   Loralie Champagne 01/22/2015 7:28 AM   BP lower, patient more lethargic.  Having to add back vasopressin.  No longer able to draw off fluid due to low BP.  UA suggests possibility of UTI.   - Will send blood cultures and add Zosyn for now.   CBC re-sent, earlier CBC from this morning likely inaccurate, he will not be getting blood.   I talked to his wife about moving towards hospice/comfort care.   Loralie Champagne 01/22/2015 12:03 PM

## 2015-01-22 NOTE — Consult Note (Signed)
Consultation Note Date: 01/22/2015   Patient Name: [name]  DOB: 10-Feb-1956  MRN: [id number]  Age / Sex: 59 y.o., male   PCP: Chipper Herb, MD Referring Physician: Larey Dresser, MD  Reason for Consultation: Establishing goals of care and Withdrawal of life-sustaining treatment  Palliative Care Discussion Held Today: Met with pt's wife, son and daughter-in-law to discuss disease progression related to end stage cardiac disease , acute on chronic  renal failure and now liver impairment. Pt was unable to participate in discussion, was minimally responsive. Family recognizes that he has been  declining since November and now acutely during this hospitalization. [name] has shared with his family that "he doesn't wish to be kept alive on machines" and that he feels that he is dying. Additionally he told his family that he would not want to continue aggressive treatments if they were no longer beneficial. Family has met with cardiology as well as nephrology and show good understanding of disease progression and were able to make decisions regarding transitioning to comfort measures. Family is desiring comfort measures but could struggle with seeing abrupt cessation of all interventions at once as they come to terms with this terminal event but they are expecting dc of CVVHD and deactivation of defibrillator. Specifically: 1. Change to DNR 2. Deactivate defibrillator 3. Stop dialysis 4. Wean pressors 5. Stop blood draws 6. Given prognosis of hours to days 7. If pt does stabilize after discontinuation of life sustaining measures, did introduce the idea of transfer out of ICU for pt and family comfort.  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 PMH reviewed. He has remained oliguric despite trial of IV lasix and has failed IHD due to hypotension. CVVHD started 01/19/15 but has still required pressors to maintain BP. Nephrology recommending stopping CVVHD secondary to lack of response and transitioning to comfort measures Palliative Review of Systems: Other: unable to participate. Minimally responsive     I have reviewed the medical record, interviewed the patient and family, and examined the patient. The following aspects are pertinent.  Past Medical History  Diagnosis Date  . HYPERLIPIDEMIA-MIXED   . Obesity, unspecified   . HYPERTENSION, UNSPECIFIED   . CAD (coronary artery disease)     a. Nonobstructive moderate CAD by cath - last 2012.  [name] Hypertrophic obstructive cardiomyopathy   . Complete heart block     a. s/p pacemaker later upgraded to CRT-D (MDT).  . CKD (chronic kidney disease), stage IV     a. Baseline Cr 1.4-1.5 prior to 09/2014-->2.3-2.8 during admission 09/2014.  [name] Chronic systolic CHF (congestive heart failure)     a. NICM (EF 25% in 2012, 20-25% in 09/2013). b. s/p BiV-ICD (MDT);  c. 10/2014 EF 25%.  . Colon polyps   . Ventricular tachycardia     a. EPS/RFCA of VT in 2012. b. On amio, mexilitene. c. Last BiV-ICD gen change 05/2013 (MDT).  . Hypothyroidism   . Fatty liver disease, nonalcoholic   . BPH (benign prostatic hyperplasia)   . Habitual alcohol use     Quit Christmas  . Liver cirrhosis   . 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 DM     insulin dependent   . Left  ventricular apical thrombus     a. 10/2014 Echo: 1.5x1.5 cm apical clot-->on eliquis.  [name] NICM (nonischemic cardiomyopathy)     a. 10/2014 Echo: EF 25%, diff HK, 1.5 x 1.5 cm apical clot, mod dil LA.   History   Social History  . Marital Status: Married    Spouse Name: N/A  . Number of Children: 1  . Years of Education: N/A   Social History Main Topics  . Smoking  status: Former Smoker    Types: Cigarettes    Quit date: 10/04/1968  . Smokeless tobacco: Never Used     Comment: 30+ yrs  . Alcohol Use: Yes     Comment: about 1 cup of liquor daily (straight liquor - not mixed)  . Drug Use: No  . Sexual Activity: Not on file   Other Topics Concern  . None   Social History Narrative   Lives at home with wife, son, daughter in law and two grands.    Family History  Problem Relation Age of Onset  . Diabetic kidney disease Father   . Coronary artery disease Neg Hx    Scheduled Meds: . antiseptic oral rinse  7 mL Mouth Rinse BID  . darbepoetin (ARANESP) injection - DIALYSIS  100 mcg Intravenous Q Mon-HD  . insulin aspart  0-15 Units Subcutaneous TID WC  . insulin aspart  0-5 Units Subcutaneous QHS  . insulin aspart  16 Units Subcutaneous Q supper  . insulin glargine  50 Units Subcutaneous BID  . lactulose  20 g Oral Daily  . levothyroxine  150 mcg Oral QAC breakfast  . linagliptin  5 mg Oral Daily  . mexiletine  150 mg Oral TID  . pantoprazole  40 mg Oral BID  . piperacillin-tazobactam  3.375 g Intravenous 4 times per day  . rosuvastatin  10 mg Oral Daily  . sodium chloride  3 mL Intravenous Q12H  . tamsulosin  0.4 mg Oral Daily  . warfarin   Does not apply Once  . Warfarin - Pharmacist Dosing Inpatient   Does not apply q1800   Continuous Infusions: . norepinephrine (LEVOPHED) Adult infusion 40 mcg/min (01/22/15 0830)  . dialysis replacement fluid (prismasate) 200 mL/hr at 01/21/15 1625  . dialysis replacement fluid (prismasate) 200 mL/hr at 01/22/15 1258  . dialysate (PRISMASATE) 1,000 mL/hr at 01/22/15 0300  . vasopressin (PITRESSIN) infusion - *FOR SHOCK* 0.03 Units/min (01/22/15 0930)   PRN Meds:.sodium chloride, acetaminophen, ALPRAZolam, heparin, heparin, ondansetron (ZOFRAN) IV, sodium chloride Medications Prior to Admission:  Prior to Admission medications   Medication Sig Start Date End Date Taking? Authorizing Provider    apixaban (ELIQUIS) 5 MG TABS tablet Take 1 tablet (5 mg total) by mouth 2 (two) times daily. 10/17/14  Yes Rogelia Mire, NP  carvedilol (COREG) 6.25 MG tablet Take 1 tablet (6.25 mg total) by mouth 2 (two) times daily with a meal. 10/11/14  Yes Kelvin Cellar, MD  cholecalciferol (VITAMIN D) 1000 UNITS tablet Take 2,000 Units by mouth daily at 12 noon.    Yes Historical Provider, MD  fish oil-omega-3 fatty acids 1000 MG capsule Take 1 capsule by mouth 3 (three) times daily.    Yes Historical Provider, MD  furosemide (LASIX) 40 MG tablet Take 3 tablets (120 mg total) by mouth 2 (two) times daily. 01/05/15  Yes Almyra Deforest, PA  hydrALAZINE (APRESOLINE) 10 MG tablet Take 1 tablet (10 mg total) by mouth every 8 (eight) hours. 12/24/14  Yes Chipper Herb, MD  insulin  glargine (LANTUS) 100 UNIT/ML injection Inject 50 Units into the skin 2 (two) times daily.  07/25/14  Yes Tammy Eckard, PHARMD  insulin lispro (HUMALOG KWIKPEN) 100 UNIT/ML KiwkPen Inject 16 units with largest/evening meal.Dispense quikpens Patient taking differently: Inject 16 Units into the skin daily with supper.  02/28/14  Yes Chipper Herb, MD  isosorbide mononitrate (IMDUR) 30 MG 24 hr tablet Take 1 tablet (30 mg total) by mouth daily. 12/24/14  Yes Chipper Herb, MD  levothyroxine (SYNTHROID, LEVOTHROID) 150 MCG tablet Take 150 mcg by mouth daily before breakfast. To be taken with 1/2 of a 77mcg tab (37.71mcg) to equal 187.6mcg   Yes Historical Provider, MD  levothyroxine (SYNTHROID, LEVOTHROID) 75 MCG tablet Take 37.5 mcg by mouth daily before breakfast. To be taken with levo 128mcg to equal 187.56mcg   Yes Historical Provider, MD  linagliptin (TRADJENTA) 5 MG TABS tablet Take 1 tablet (5 mg total) by mouth daily. Patient taking differently: Take 5 mg by mouth daily at 12 noon.  12/24/14  Yes Chipper Herb, MD  metolazone (ZAROXOLYN) 2.5 MG tablet Take 1 tablet (2.5 mg total) by mouth daily. Take 30 minutes prior to AM dose of Lasix.  01/10/15  Yes Minus Breeding, MD  mexiletine (MEXITIL) 150 MG capsule TAKE 1 CAPSULE EVERY 8 HOURS Patient taking differently: Take 150 mg by mouth 3 (three) times daily.  02/28/14  Yes Chipper Herb, MD  potassium chloride SA (K-DUR,KLOR-CON) 20 MEQ tablet Take 2 tablets (40 mEq total) by mouth 3 (three) times daily. 01/05/15  Yes Almyra Deforest, PA  rosuvastatin (CRESTOR) 20 MG tablet Take 0.5 tablets (10 mg total) by mouth daily. Patient taking differently: Take 10 mg by mouth at bedtime.  10/14/14  Yes Chipper Herb, MD  tamsulosin (FLOMAX) 0.4 MG CAPS capsule Take 1 capsule (0.4 mg total) by mouth daily. 12/24/14  Yes Chipper Herb, MD   Allergies  Allergen Reactions  . Ace Inhibitors     AVOID ALL  BECAUSE OF HEART DISEASE   . Amiodarone     Abnormal LFTs (10/2014)  . Nitroglycerin Other (See Comments)    Reaction unknown  . Procan Sr [Procainamide Hcl]     PT. HAS HEART DISEASE CALLED HYPERTHROPHIC CARDIOMYOPATHY  . Quinidine     PT. HAS HEART DISEASE CALLED HYPERTHROPHIC CARDIOMYOPATHY     CBC:    Component Value Date/Time   WBC 16.1* 01/22/2015 0737   WBC 4.3* 12/19/2014 1458   HGB 8.5* 01/22/2015 0737   HGB 10.9* 12/19/2014 1458   HCT 26.9* 01/22/2015 0737   HCT 24.0* 01/16/2015 1520   HCT 35.1* 12/19/2014 1458   PLT 98* 01/22/2015 0737   MCV 92.8 01/22/2015 0737   MCV 87.5 12/19/2014 1458   NEUTROABS 13.0* 01/22/2015 0737   LYMPHSABS 1.0 01/22/2015 0737   MONOABS 2.1* 01/22/2015 0737   EOSABS 0.1 01/22/2015 0737   BASOSABS 0.0 01/22/2015 0737   Comprehensive Metabolic Panel:    Component Value Date/Time   NA 134* 01/22/2015 0530   NA 139 01/08/2015 1341   K 4.1 01/22/2015 0530   CL 103 01/22/2015 0530   CO2 22 01/22/2015 0530   BUN 23 01/22/2015 0530   BUN 55* 01/08/2015 1341   CREATININE 3.37* 01/22/2015 0530   CREATININE 3.56* 01/13/2015 1230   GLUCOSE 44* 01/22/2015 0530   GLUCOSE 111* 01/08/2015 1341   CALCIUM 7.5* 01/22/2015 0530   AST 45* 01/22/2015 0530     ALT 17 01/22/2015 0530  ALKPHOS 74 01/22/2015 0530   BILITOT 0.9 01/22/2015 0530   BILITOT 1.2 12/19/2014 0946   PROT 5.8* 01/22/2015 0530   PROT 6.2 12/19/2014 0946   ALBUMIN 2.3* 01/22/2015 0530   ALBUMIN 2.3* 01/22/2015 0530    Physical Exam: Vital Signs: BP 107/41 mmHg  Pulse 65  Temp(Src) 98.7 F (37.1 C) (Oral)  Resp 20  Ht $R'6\' 1"'fw$  (1.854 m)  Wt 135.8 kg (299 lb 6.2 oz)  BMI 39.51 kg/m2  SpO2 98% SpO2: SpO2: 98 % O2 Device: O2 Device: Simple Mask O2 Flow Rate: O2 Flow Rate (L/min): 3 L/min  Intake/output summary:  Intake/Output Summary (Last 24 hours) at 01/22/15 1351 Last data filed at 01/22/15 1300  Gross per 24 hour  Intake 1787.85 ml  Output   3685 ml  Net -1897.15 ml    Baseline Weight: Weight: 135.6 kg (298 lb 15.1 oz) Most recent weight: Weight: 135.8 kg (299 lb 6.2 oz)  Physical Exam:             General Appearance:minimally responsive             Heart: distant, irrg, 4+ lower extremity edema             Respiratory: audible wheezing  Abdomen: BS hypoactive, distended, ascites  Neuro: minimally responsive; no myoclonus observed   Prior to Admission medications   Medication Sig Start Date End Date Taking? Authorizing Provider  apixaban (ELIQUIS) 5 MG TABS tablet Take 1 tablet (5 mg total) by mouth 2 (two) times daily. 10/17/14  Yes Rogelia Mire, NP  carvedilol (COREG) 6.25 MG tablet Take 1 tablet (6.25 mg total) by mouth 2 (two) times daily with a meal. 10/11/14  Yes Kelvin Cellar, MD  cholecalciferol (VITAMIN D) 1000 UNITS tablet Take 2,000 Units by mouth daily at 12 noon.    Yes Historical Provider, MD  fish oil-omega-3 fatty acids 1000 MG capsule Take 1 capsule by mouth 3 (three) times daily.    Yes Historical Provider, MD  furosemide (LASIX) 40 MG tablet Take 3 tablets (120 mg total) by mouth 2 (two) times daily. 01/05/15  Yes Almyra Deforest, PA  hydrALAZINE (APRESOLINE) 10 MG tablet Take 1 tablet (10 mg total) by mouth every 8 (eight) hours.  12/24/14  Yes Chipper Herb, MD  insulin glargine (LANTUS) 100 UNIT/ML injection Inject 50 Units into the skin 2 (two) times daily.  07/25/14  Yes Tammy Eckard, PHARMD  insulin lispro (HUMALOG KWIKPEN) 100 UNIT/ML KiwkPen Inject 16 units with largest/evening meal.Dispense quikpens Patient taking differently: Inject 16 Units into the skin daily with supper.  02/28/14  Yes Chipper Herb, MD  isosorbide mononitrate (IMDUR) 30 MG 24 hr tablet Take 1 tablet (30 mg total) by mouth daily. 12/24/14  Yes Chipper Herb, MD  levothyroxine (SYNTHROID, LEVOTHROID) 150 MCG tablet Take 150 mcg by mouth daily before breakfast. To be taken with 1/2 of a 59mcg tab (37.70mcg) to equal 187.65mcg   Yes Historical Provider, MD  levothyroxine (SYNTHROID, LEVOTHROID) 75 MCG tablet Take 37.5 mcg by mouth daily before breakfast. To be taken with levo 138mcg to equal 187.65mcg   Yes Historical Provider, MD  linagliptin (TRADJENTA) 5 MG TABS tablet Take 1 tablet (5 mg total) by mouth daily. Patient taking differently: Take 5 mg by mouth daily at 12 noon.  12/24/14  Yes Chipper Herb, MD  metolazone (ZAROXOLYN) 2.5 MG tablet Take 1 tablet (2.5 mg total) by mouth daily. Take 30 minutes prior to AM dose  of Lasix. 01/10/15  Yes Minus Breeding, MD  mexiletine (MEXITIL) 150 MG capsule TAKE 1 CAPSULE EVERY 8 HOURS Patient taking differently: Take 150 mg by mouth 3 (three) times daily.  02/28/14  Yes Chipper Herb, MD  potassium chloride SA (K-DUR,KLOR-CON) 20 MEQ tablet Take 2 tablets (40 mEq total) by mouth 3 (three) times daily. 01/05/15  Yes Almyra Deforest, PA  rosuvastatin (CRESTOR) 20 MG tablet Take 0.5 tablets (10 mg total) by mouth daily. Patient taking differently: Take 10 mg by mouth at bedtime.  10/14/14  Yes Chipper Herb, MD  tamsulosin (FLOMAX) 0.4 MG CAPS capsule Take 1 capsule (0.4 mg total) by mouth daily. 12/24/14  Yes Chipper Herb, MD               Neurologic: 5/5 strength bilateral upper and lower extremities               Additional Data Reviewed: Recent Labs     01/21/15  1600  01/22/15  0530  01/22/15  0737  WBC   --   11.7*  16.1*  HGB   --   6.0*  8.5*  PLT   --   64*  98*  NA  129*  134*   --   BUN  28*  23   --   CREATININE  4.09*  3.37*   --       Palliative Care Assessment and Plan Summary of Established Goals of Care and Medical Treatment Preferences    Primary Diagnoses  Present on Admission:  . Acute on chronic systolic CHF (congestive heart failure)  Prognosis: Hours - Days  Psycho-social/Spiritual:   Know diagnosis: yes  Knows prognosis: yes  Patient's preference about sharing medical information: Unsure  Desire for further Chaplaincy support:no  Palliative Performance Scale: 20% at best  Recommendations:  1. Code Status:     Code Status Orders        Start     Ordered   01/22/15 1348  Do not attempt resuscitation (DNR)   Continuous    Question Answer Comment  In the event of cardiac or respiratory ARREST Do not call a "code blue"   In the event of cardiac or respiratory ARREST Do not perform Intubation, CPR, defibrillation or ACLS   In the event of cardiac or respiratory ARREST Use medication by any route, position, wound care, and other measures to relive pain and suffering. May use oxygen, suction and manual treatment of airway obstruction as needed for comfort.      01/22/15 1347      2. Limitations on life sustaining treatments:  No Hemodialysis Transitioning to Full Comfort Care Titrating off pressors  Deactivate defibrillator  3. Symptom Management:   Dyspnea: Could anticipate pt becoming more dyspneic given cardiorenal failure. Will add morphine 1-2 mg q1 hour prn. Pt likely opioid naive   Pain: Per chart review pt has not had much pain and is currently not on any opioids. Will add low dose morphine 1- $RemoveBef'2mg'vHWwkDzyTv$  q1 prn  Anxiety/agitation: No non-verbal indication of anxiety/agitation but will add low dose ativan prn for any associated symptoms  especially if pt is dyspneic. Will add ativan 0.5-$RemoveBefore'1mg'BIxYiQaylYamB$  q4 prn  4. Disposition: Remain in ICU for now as pressor weaning begins, HD stopped. If pt stabalizes consider moving to floor for pt and family comfort at EOL. Leaving the hospital not likely but hospice facility option was generally introduced.   Time In: 1200 Time Out: 1315 Time Total:  23min  Greater than 50%  of this time was spent counseling and coordinating care related to the above assessment and plan. Discussed outcome and recommendations with Darrick Grinder, NP, with Advanced Heart Failure. She agreed to handle weaning of pressors and deactivation of defibrillator. In agreement with low dose morphine and ativan and above plan. Signed by:  Dory Horn, NP  01/22/2015, 1:51 PM  Please contact Palliative Medicine Team phone at [phone] for questions and concerns.

## 2015-01-22 NOTE — Progress Notes (Signed)
CRITICAL VALUE ALERT  Critical value received:  Hg 6.0 and INR 6.3  Date of notification:  01/22/2015  Time of notification:  0635  Critical value read back:Yes.    Nurse who received alert:  Henriette Combs  MD notified (1st page):  Annie Paras NP oncall  Time of first page: 0636 MD notified (2nd page):  Time of second page:  Responding MD:  Annie Paras NP   Time MD responded:  [phone]

## 2015-01-22 NOTE — Progress Notes (Signed)
Patient ID: [name], male   DOB: August 04, 1956, 59 y.o.   MRN: [id number] S:lethargic and confused this am O:BP 88/21 mmHg  Pulse 59  Temp(Src) 97.4 F (36.3 C) (Oral)  Resp 25  Ht  (1.854 m)  Wt 135.8 kg (299 lb 6.2 oz)  BMI 39.51 kg/m2  SpO2 99%  Intake/Output Summary (Last 24 hours) at 01/22/15 1017 Last data filed at 01/22/15 1000  Gross per 24 hour  Intake 1816.7 ml  Output   3900 ml  Net -2083.3 ml   Intake/Output: I/O last 3 completed shifts: In: 2888.3 [P.O.:1420; I.V.:1468.3] Out: 5640 [Urine:90; Other:5550]  Intake/Output this shift:  Total I/O In: 310.8 [P.O.:240; I.V.:70.8] Out: 681 [Urine:40; Other:641] Weight change: -3.1 kg (-6 lb 13.4 oz) WUJ:WJXBJ WM who is lethargic and confused YNW:GNFAO HS Resp:decreased BS ZHY:QMVHQIONG Ext:+edema   Recent Labs Lab 01/06/2015 1300  01/17/15 0330 01/18/15 0345 01/19/15 1620 01/20/15 0400 01/20/15 1600 01/21/15 0515 01/21/15 1600 01/22/15 0530  NA 134*  < > 132*  133* 130* 130* 131* 130* 130* 129* 134*  K 6.6*  < > 4.9  5.0 5.0 4.1 4.3 4.6 4.6 5.0 4.1  CL 104  < > 101  102 98 97 97 97 96 97 103  CO2 20  < > GLUCOSE 68*  < > 128*  129* 93 121* 60* 128* 103* 135* 44*  BUN 77*  < > 78*  77* 80* 48* 39* 35* 29* 28* 23  CREATININE 4.05*  < > 5.02*  5.02* 5.77* 5.11* 4.63* 4.29* 4.12* 4.09* 3.37*  ALBUMIN 2.7*  --  2.6*  --  2.6* 2.6* 2.7* 2.6* 2.6* 2.3*  CALCIUM 9.2  < > 8.8  8.9 8.8 8.6 8.5 8.6 8.6 8.5 7.5*  PHOS  --   --  5.4*  --  5.1* 4.6 4.2 4.0 4.2 3.5  AST 58*  --   --   --   --   --   --   --   --   --   ALT 30  --   --   --   --   --   --   --   --   --   < > = values in this interval not displayed. Liver Function Tests:  Recent Labs Lab 01/28/2015 1300  01/21/15 0515 01/21/15 1600 01/22/15 0530  AST 58*  --   --   --   --   ALT 30  --   --   --   --   ALKPHOS 83  --   --   --   --   BILITOT 1.2  --   --   --   --   PROT 6.7  --   --   --   --    ALBUMIN 2.7*  < > 2.6* 2.6* 2.3*  < > = values in this interval not displayed. No results for input(s): LIPASE, AMYLASE in the last 168 hours. No results for input(s): AMMONIA in the last 168 hours. CBC:  Recent Labs Lab 01/20/15 0400 01/21/15 0515 01/21/15 0832 01/22/15 0530 01/22/15 0737  WBC 7.3 15.7* 13.8* 11.7* 16.1*  NEUTROABS  --   --   --   --  13.0*  HGB 8.3* 8.5* 8.1* 6.0* 8.5*  HCT 25.6* 26.9* 25.2* 19.0* 26.9*  MCV 90.1 91.8 91.6 92.2 92.8  PLT 94* 139* 123* 64* 98*   Cardiac  Enzymes:  Recent Labs Lab 2015-02-11 1300  TROPONINI 0.40*   CBG:  Recent Labs Lab 01/21/15 0805 01/21/15 0951 01/21/15 2132 01/22/15 0650 01/22/15 0738  GLUCAP 83 122* 75 54* 94    Iron Studies: No results for input(s): IRON, TIBC, TRANSFERRIN, FERRITIN in the last 72 hours. Studies/Results: Dg Chest Port 1 View  01/21/2015   CLINICAL DATA:  Pneumonia.  EXAM: PORTABLE CHEST - 1 VIEW  COMPARISON:  02/11/15.  FINDINGS: Stable enlarged cardiac silhouette. Stable right jugular catheter. Interval second large bore right jugular catheter with its tip obscured by an overlying cardiac lead, in the region of the inferior portion of the superior vena cava. No pneumothorax. Stable left subclavian pacer and AICD leads. The pulmonary vasculature remains mildly prominent. Interval mild left basilar atelectasis.  IMPRESSION: 1. Interval second right jugular catheter with its tip in the inferior aspect of the superior vena cava. 2. Interval mild left basilar atelectasis. 3. Stable cardiomegaly and pulmonary vascular congestion.   Electronically Signed   By: Beckie Salts M.D.   On: 01/21/2015 09:53   . antiseptic oral rinse  7 mL Mouth Rinse BID  . darbepoetin (ARANESP) injection - DIALYSIS  100 mcg Intravenous Q Mon-HD  . insulin aspart  0-15 Units Subcutaneous TID WC  . insulin aspart  0-5 Units Subcutaneous QHS  . insulin aspart  16 Units Subcutaneous Q supper  . insulin glargine  50 Units  Subcutaneous BID  . lactulose  20 g Oral Daily  . levothyroxine  150 mcg Oral QAC breakfast  . linagliptin  5 mg Oral Daily  . mexiletine  150 mg Oral TID  . pantoprazole  40 mg Oral BID  . rosuvastatin  10 mg Oral Daily  . sodium chloride  3 mL Intravenous Q12H  . tamsulosin  0.4 mg Oral Daily  . warfarin   Does not apply Once  . Warfarin - Pharmacist Dosing Inpatient   Does not apply q1800    BMET    Component Value Date/Time   NA 134* 01/22/2015 0530   NA 139 01/08/2015 1341   K 4.1 01/22/2015 0530   CL 103 01/22/2015 0530   CO2 22 01/22/2015 0530   GLUCOSE 44* 01/22/2015 0530   GLUCOSE 111* 01/08/2015 1341   BUN 23 01/22/2015 0530   BUN 55* 01/08/2015 1341   CREATININE 3.37* 01/22/2015 0530   CREATININE 3.56* 01/13/2015 1230   CALCIUM 7.5* 01/22/2015 0530   GFRNONAA 19* 01/22/2015 0530   GFRNONAA 50* 01/30/2013 1016   GFRAA 21* 01/22/2015 0530   GFRAA 58* 01/30/2013 1016   CBC    Component Value Date/Time   WBC 16.1* 01/22/2015 0737   WBC 4.3* 12/19/2014 1458   RBC 2.90* 01/22/2015 0737   RBC 4.01* 12/19/2014 1458   RBC 3.31* 10/06/2014 0744   HGB 8.5* 01/22/2015 0737   HGB 10.9* 12/19/2014 1458   HCT 26.9* 01/22/2015 0737   HCT 24.0* 01/16/2015 1520   HCT 35.1* 12/19/2014 1458   PLT 98* 01/22/2015 0737   MCV 92.8 01/22/2015 0737   MCV 87.5 12/19/2014 1458   MCH 29.3 01/22/2015 0737   MCH 27.1 12/19/2014 1458   MCHC 31.6 01/22/2015 0737   MCHC 31.0* 12/19/2014 1458   RDW 16.8* 01/22/2015 0737   LYMPHSABS 1.0 01/22/2015 0737   MONOABS 2.1* 01/22/2015 0737   EOSABS 0.1 01/22/2015 0737   BASOSABS 0.0 01/22/2015 0737     Assessment/Plan: 1. AKI/CKD- in setting of decompensated CHF worrisome for cardiorenal syndrome or  even hepatorenal. Remains oliguric/anuric despite trial of high dose IV lasix and has failed IHD due to hypotension. Started on CVVHD 01/19/15 but still requires pressors to maintain BP (at least able to wean milrinone and vasopressin,  still on norepi gtt).  1. He is not even tolerating CVVHDF at this point.  We are dealing with a terminal process and I have spoken to his wife who does not want him to suffer. 2. Recommend palliative care consult to arrange for comfort measures 2. Acute on chronic systolic CHF- Appreciate Cardiology's assistance in management. He is currently on milrinone, norepi, and vasopressin gtts presumably due to cardiorenal syndrome and has failed to respond to high dose IV lasix and IHD (due to hypotension). 1. Recommend stopping CVVHDF and transition to comfort measures only as nothing is reversible nor is he responding to current level of care now requiring more pressors 3. Hyperkalemia- resolved with CVVHD 4. 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 Complicates management significantly 5. Constipation- will treat with lactulose daily 6. DM- per primary service 7. Anemia- likely due to chronic kidney disease. Will check iron stores and may benefit from initiation of Aranesp. 8. CMP- EF of 25% will make longterm dialysis difficult 9. CAD- chest pain free, per cards 10. Protein malnutrition- likely related to cirrhosis. 11. Disposition- pt with multiple end-stage disease processes. Poor prognosis and not clear if he would be able to tolerate intermittent hemodialysis without pressor support. He is currently failing aggressive therapy and recommend hospice involvement.  Markevious Ehmke A

## 2015-01-23 LAB — TYPE AND SCREEN
ABO/RH(D): O POS
ANTIBODY SCREEN: NEGATIVE
UNIT DIVISION: 0
Unit division: 0

## 2015-01-23 LAB — GLUCOSE, CAPILLARY
Glucose-Capillary: 133 mg/dL — ABNORMAL HIGH (ref 70–99)
Glucose-Capillary: 128 mg/dL — ABNORMAL HIGH (ref 70–99)

## 2015-01-24 LAB — URINE CULTURE
COLONY COUNT: NO GROWTH
CULTURE: NO GROWTH

## 2015-01-27 ENCOUNTER — Telehealth: Payer: Self-pay | Admitting: Cardiology

## 2015-01-27 NOTE — Telephone Encounter (Signed)
Original d/c received via mail from [employer] home on this pt, this was addressed to Dr.McLean after speaking with Thurston Hole L she stated is needed to be sent to Dr.Hochrein  I called spoke with Cynthia([facility])she is aware I will be faxing a copy over for Dr.Hochrein to sign and the Original will be placed in interoffice mail. She expressed understanding.

## 2015-01-28 ENCOUNTER — Encounter: Payer: BLUE CROSS/BLUE SHIELD | Admitting: Internal Medicine

## 2015-01-28 ENCOUNTER — Telehealth: Payer: Self-pay | Admitting: Cardiology

## 2015-01-28 LAB — CULTURE, BLOOD (ROUTINE X 2)
Culture: NO GROWTH
Culture: NO GROWTH

## 2015-01-28 NOTE — Telephone Encounter (Addendum)
01/27/2015 Received interoffice mail from [place] at [employer] with death certificate from [facility] in it on patient I copied records out of epic and gave to Dr. Grace Bushy nurse for him to fill out.  cbr  Feb 15, 2015 Received signed death certificate back from Dr. Antoine Poche made copy for our records and called for [employer] [facility] to pick up.

## 2015-02-02 NOTE — Progress Notes (Addendum)
 of morphine bag wasted in sink witnessed by Dorna Leitz RN and Garnette Czech RN. 93ml of 45ml versed bag wasted in sink witnessed by Dorna Leitz RN and Garnette Czech RN.

## 2015-02-02 NOTE — Progress Notes (Signed)
Pt's time of death 56. Pt family including wife and son present at bedside. MD notified.

## 2015-02-02 DEATH — deceased

## 2015-02-10 NOTE — Discharge Summary (Addendum)
Advanced Heart Failure Team  Discharge Summary   Patient ID: CHANNIN LENNARTZ MRN: [id number], DOB/AGE: 05-24-1956 59 y.o. Admit date: Feb 06, 2015 D/C date:   01/27/2015    Primary Discharge Diagnoses:  1. Acute on chronic systolic CHF/cardiogenic shock: NYHA class IV symptoms. Low output failure with cardiorenal syndrome. 2. CKD IV: Cardiorenal syndrome. Required CVVHD. (failed IHD due to hypotension) 3. H/o CHB: Has [employer] CRT-D device.  4. Hyperkalemia --> Admit K 6.1 5. H/o VT  6. LV apical thrombus 7. Anemia: Hgb 6.0 -->2 UPRBCs. 8. Elevated WBC 9. New onset Atrial fibrillation--> 4/17  10. Coagulopathy - INR 4.12 on 01/21/2015 and INR continue to rise to 6.29 on 01/22/2015 11. Hyponatremia- NA 132 on 01/17/2015 and dropped further to 130 on 01/18/2015.  12. Thrombocytopenia- Platelet count on admit 76K/ul then dropped to 66 K/ul on 01/17/2015  Hospital Course:   Mr Churchwell was a 59 yo with history of nonobstructive CAD, nonischemic CMP with chronic systolic CHF, CKD IV, and history of LV apical thrombus admitted acute/chronic systolic heart failure with potassium 6.1.  Critical Care consulted for central line placement and to evaluate hemodynamics. He was started on lasix drip and milrinone but CVP remained high at 29 and he was oliguric with worsening renal function. At that point Nephrology consulted with recommendations for hemodialysis. IR consulted and placed HD catheter. He failed hemodialysis due to hypotension and limited fluid removal. He became anuric and he was started on CVVHD.  He again developed hypotension and so norepinephrine and vasopressin added. Unfortunately he had ongoing hypotension so milrinone and vasopressin stopped. He remained on norepinephrine with ongoing up titration to sustain blood pressure. On 01/17/2015 sodium was 132 and dropped down to 130 mmol/l on 01/18/2015. Metabolic panel was monitored closely throughout his hospitalization.   He developed A fib on  April 17th and this was confirmed after his device was interrogated. 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  He also developed a coagulopathy problem with INR elevated to 4.12 on 01/21/2015 and rose to 6.29 on 01/22/2015. He continued to decline with hemoglobin down to 6.0 and INR 6.39 and no obvious source of bleeding was identified. He received 2UPRBCs and  2.5 mg vitamin K. Hemodynamics continued to decline and with evidence of multiple end stage disease processes, palliative care was consulted. At the conclusion of palliative care meeting he was transitioned to comfort care and made a DNR/DNI. ICD was deactivated and CVVHD stopped.   He was placed on morphine and versed drip as he was agitated and dyspneic. He later pass quietly April 21st at 3:50 am with family members at his bedside .   Duration of Discharge Encounter: Greater than 35 minutes   Signed, CLEGG,AMY NP-C  02/10/2015, 10:25 AM

## 2015-03-28 ENCOUNTER — Ambulatory Visit: Payer: BLUE CROSS/BLUE SHIELD | Admitting: Family Medicine

## 2015-10-03 IMAGING — US US ABDOMEN LIMITED
1 series · 6 of 6 positions shown · non-contrast
Comparison: October 04, 2014

CLINICAL DATA: Ascites.  History of hepatic cirrhosis

EXAM:
LIMITED ABDOMINAL ULTRASOUND

[Series 1: us abdomen limited · 0.27mm/px · 6 of 6 slices shown]
[im 1/6]
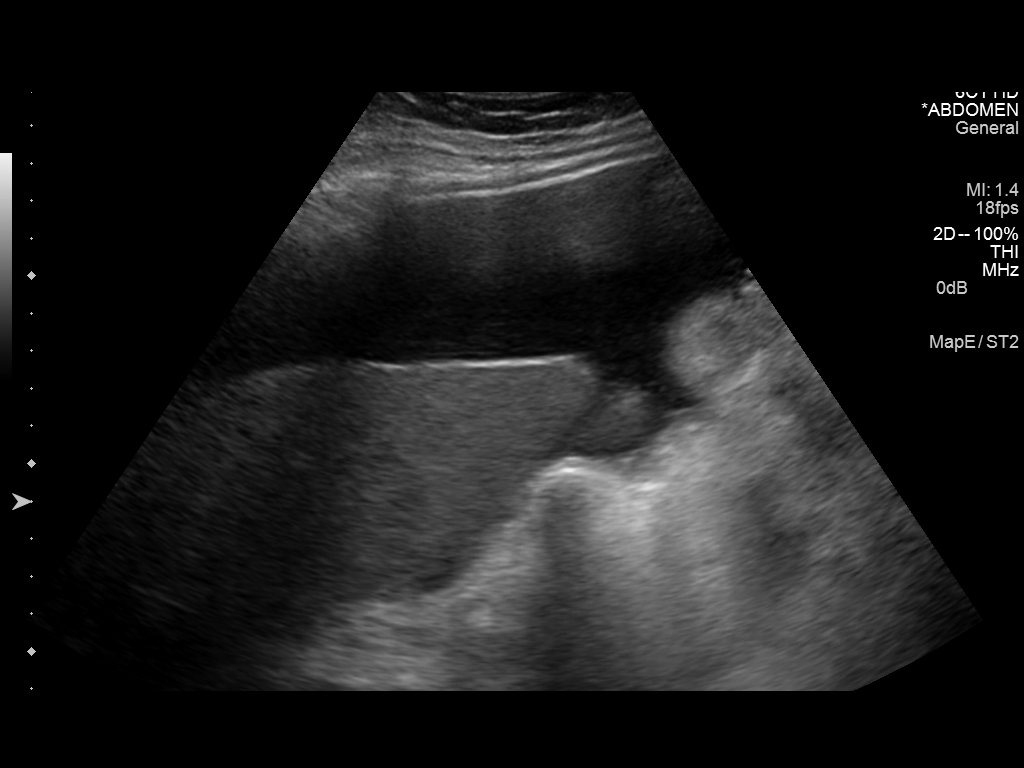
[im 2/6]
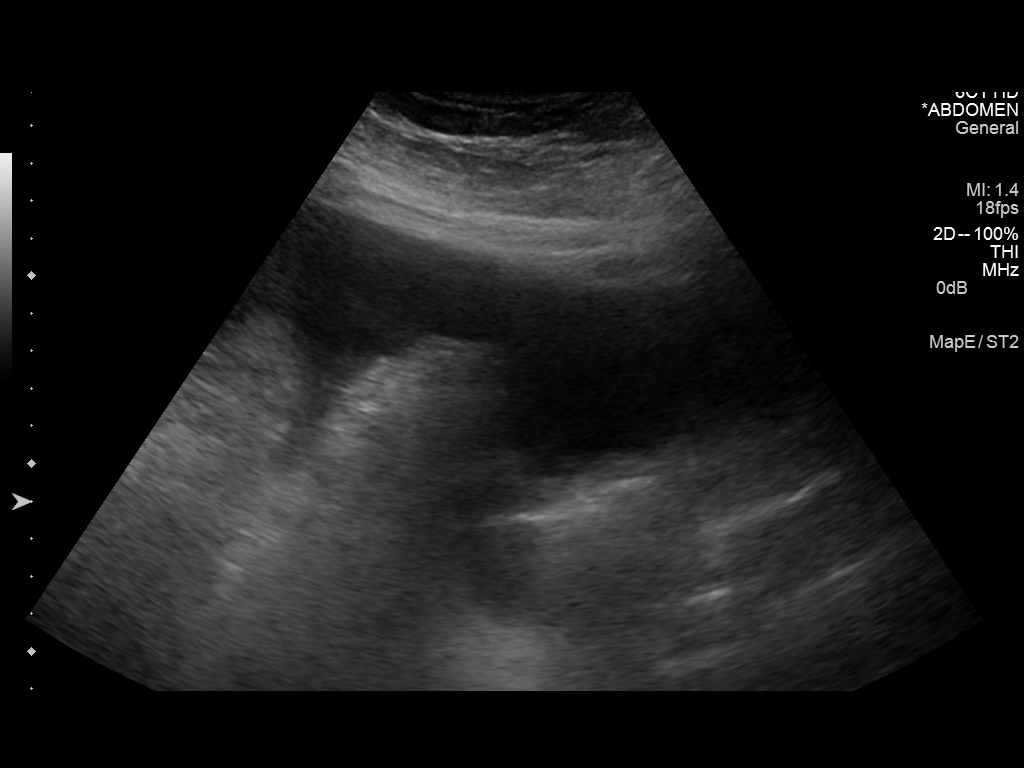
[im 3/6]
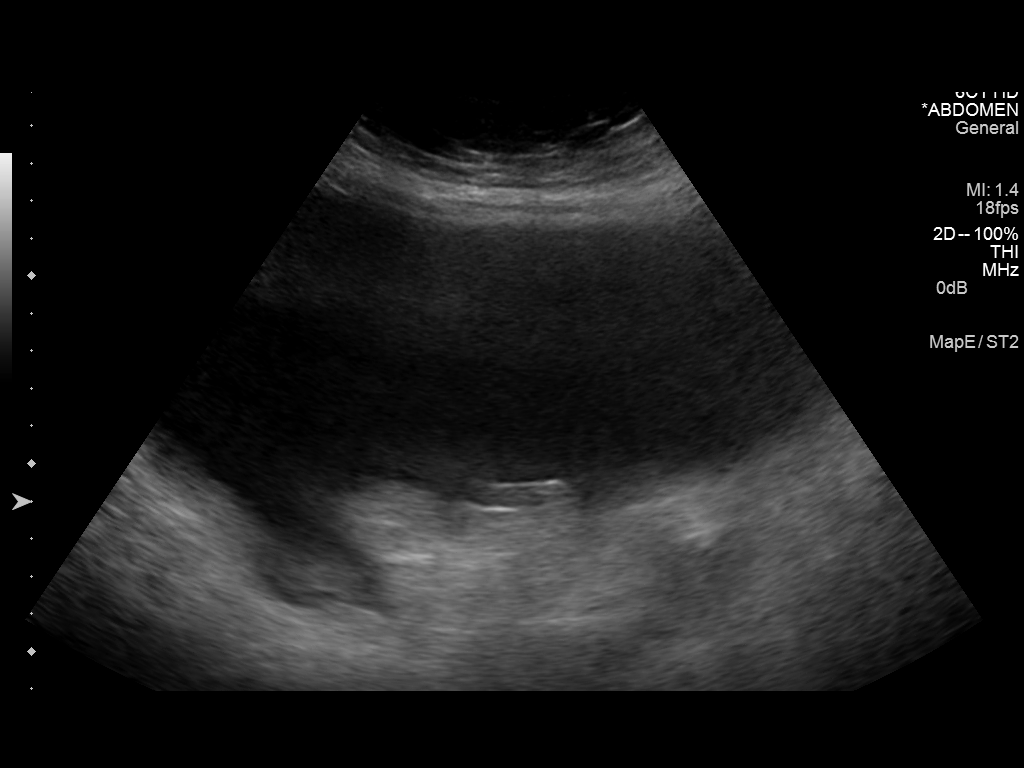
[im 4/6]
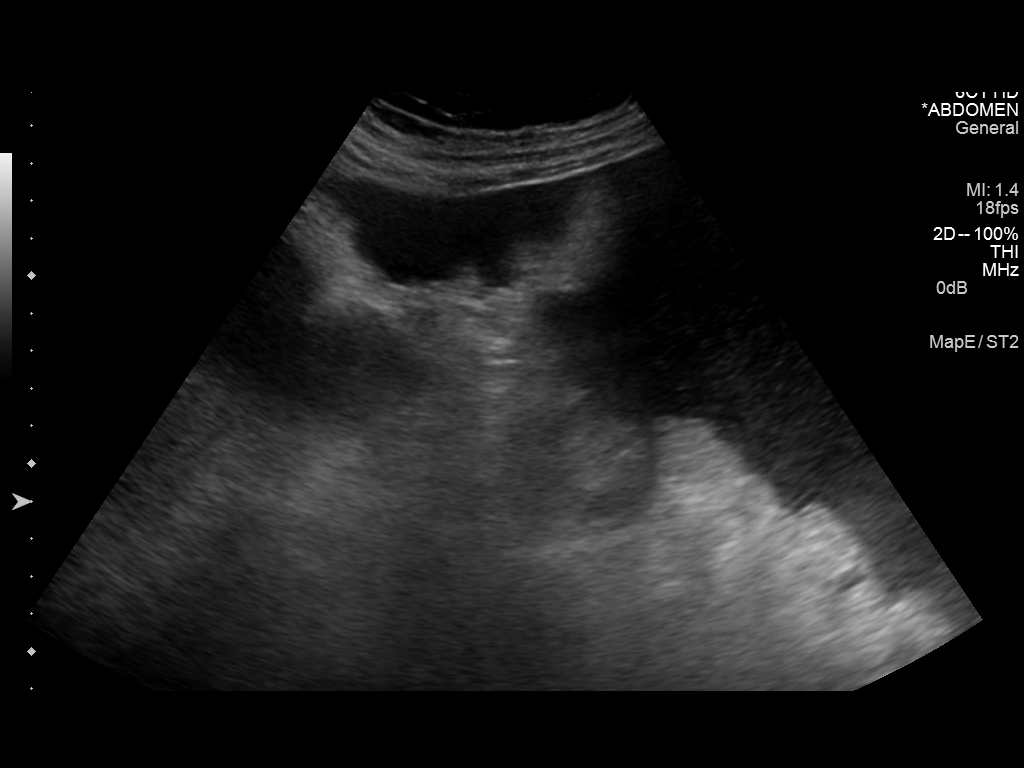
[im 5/6]
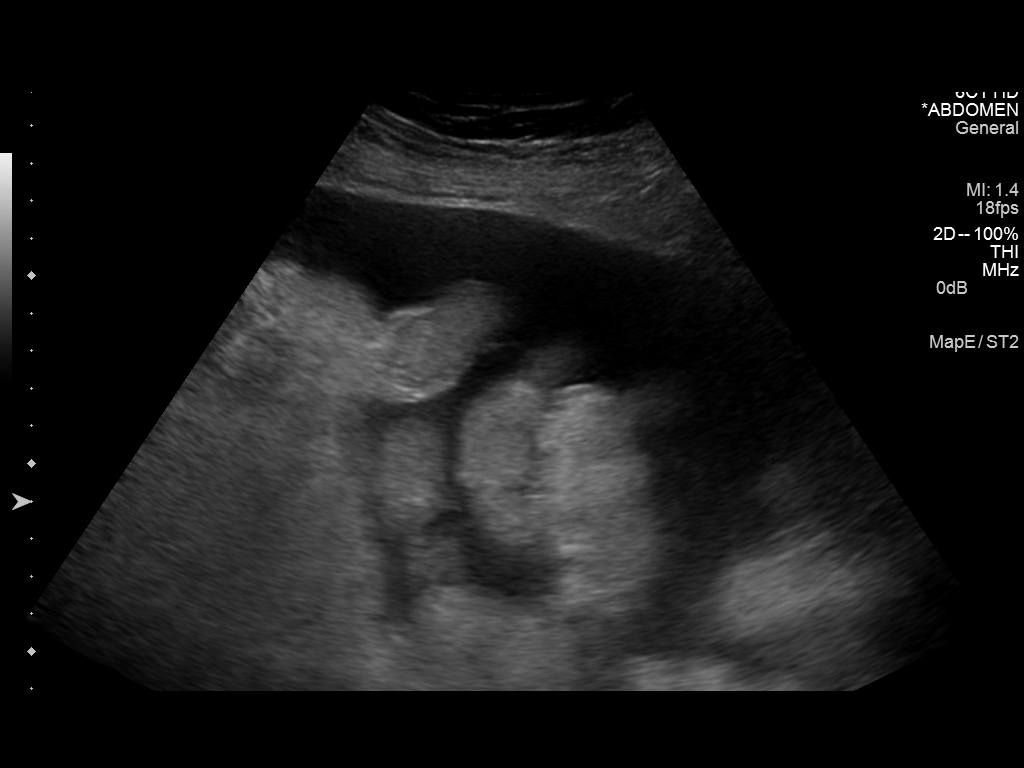
[im 6/6]
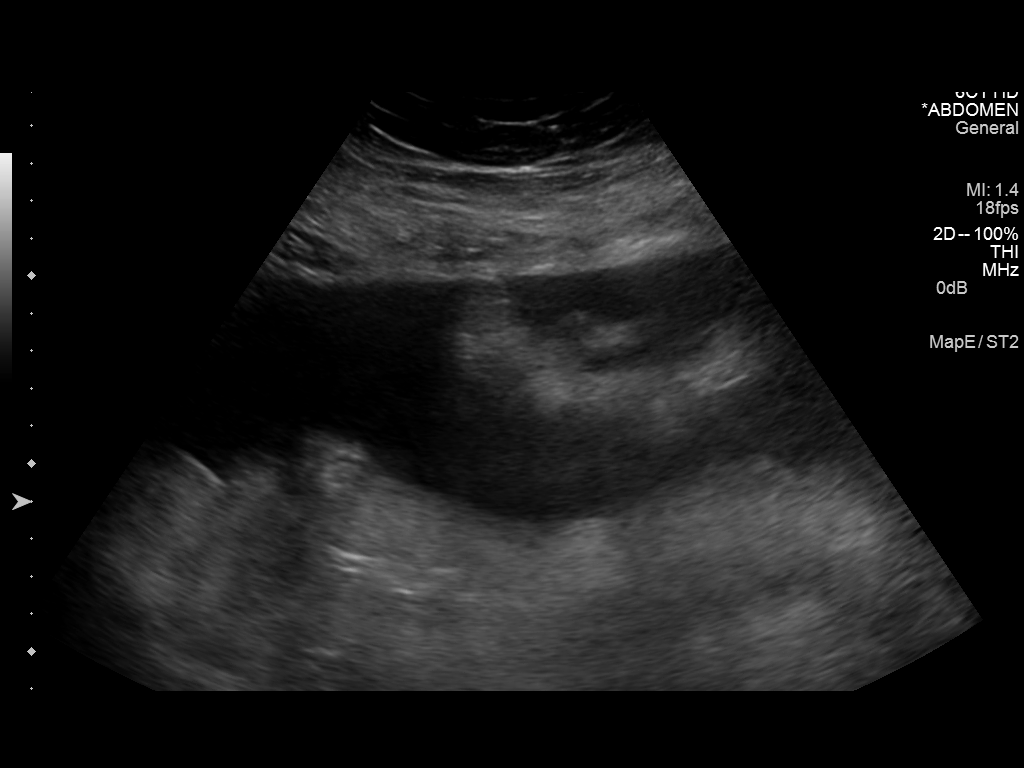

[6 of 6 positions shown; findings below may reference images not displayed]

FINDINGS: There is moderate diffuse ascites throughout the abdomen with
ascites noted in both upper and lower quadrants. There are multiple
loops of peristalsing bowel as well appear
IMPRESSION: Moderate generalized ascites.

## 2015-10-03 IMAGING — CR DG CHEST 1V PORT
1 series · 1 of 1 positions shown · non-contrast
Comparison: December 30, 2014

CLINICAL DATA: Central catheter placement

EXAM:
PORTABLE CHEST - 1 VIEW

[AP]
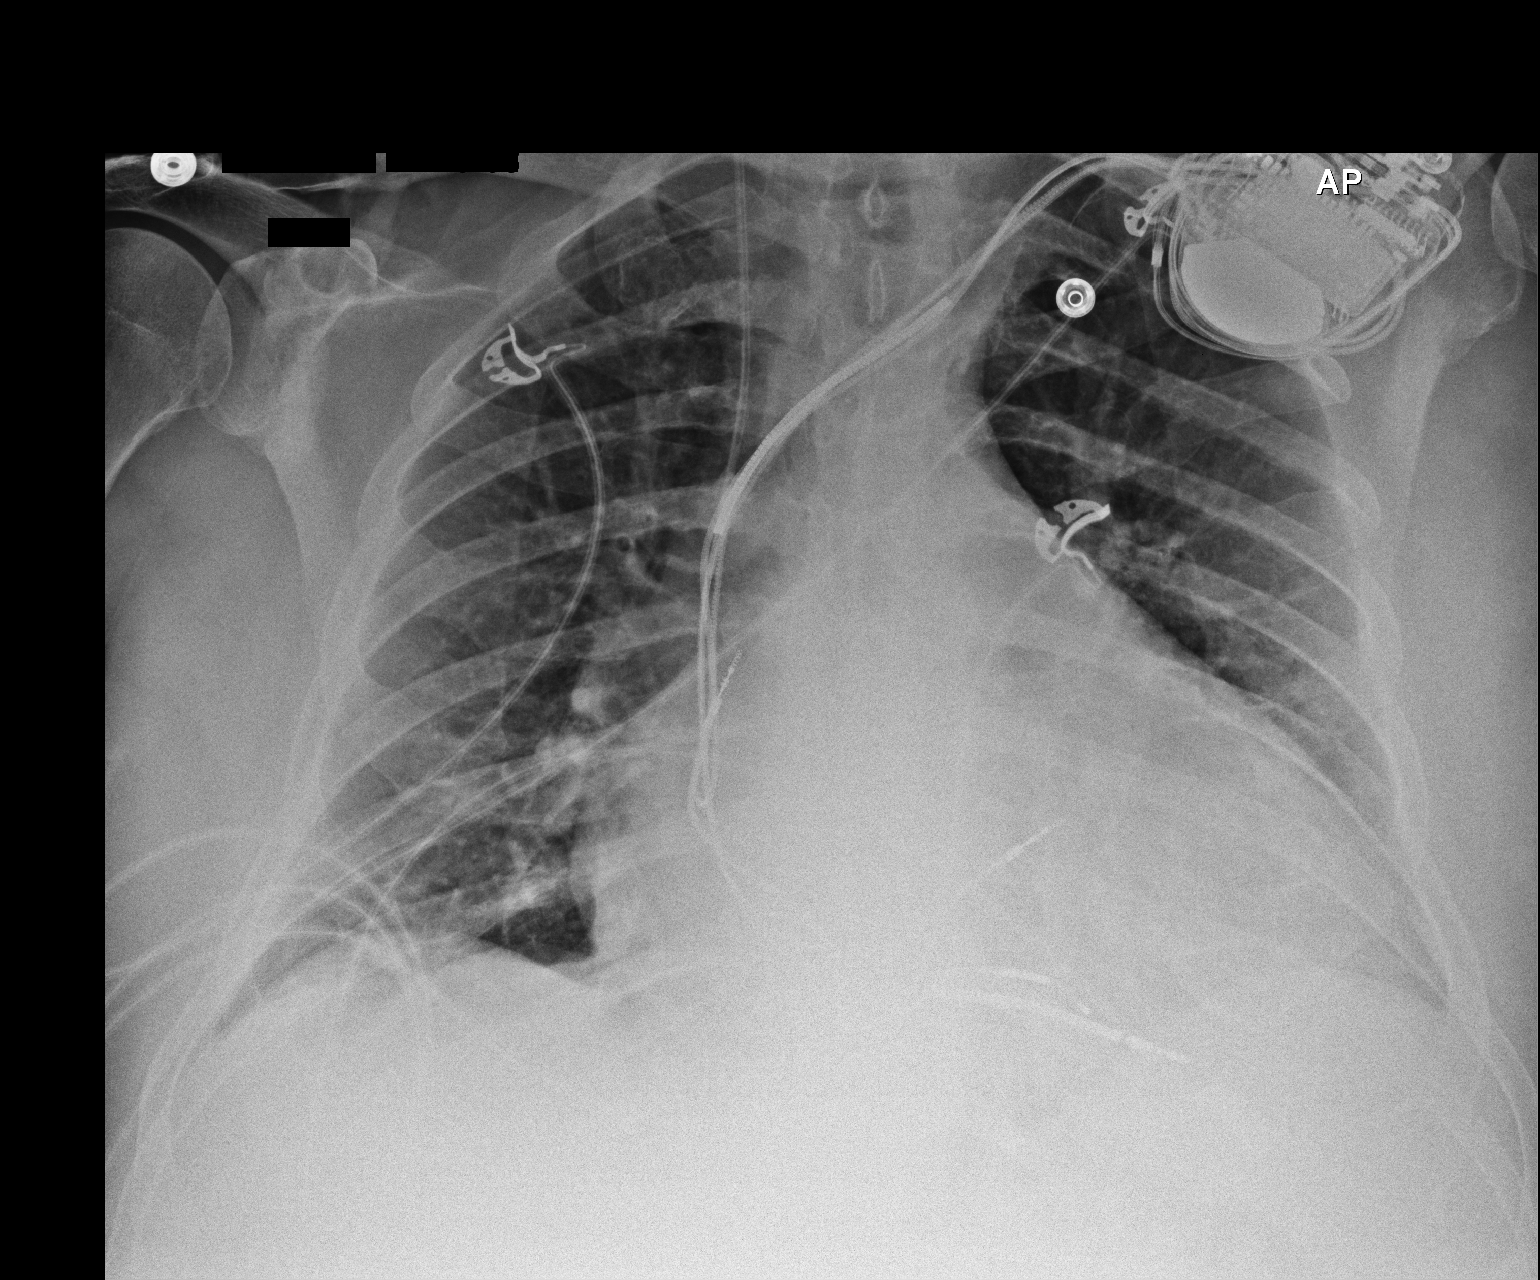

[1 of 1 positions shown; findings below may reference images not displayed]

FINDINGS: Central catheter tip is in the superior vena cava. There are
pacemaker leads in the right atrium and right ventricle regions. No
pneumothorax. Cardiomegaly with pulmonary vascular within normal
limits is stable. Lungs are clear. No adenopathy.
IMPRESSION: Central catheter tip in superior vena cava. No pneumothorax.
Cardiomegaly. No change in pacemaker lead placement. No edema or
consolidation.

## 2015-10-05 IMAGING — US IR FLUORO GUIDE CV LINE*R*
1 series · 3 of 3 positions shown · non-contrast
Comparison: none

INDICATION: 59-year-old male with acute on chronic kidney disease. He has been
referred for hemodialysis catheter placement.

[Series 1: ir fluoro guide cv line*right* · 3 of 3 slices shown]
[im 1/3]
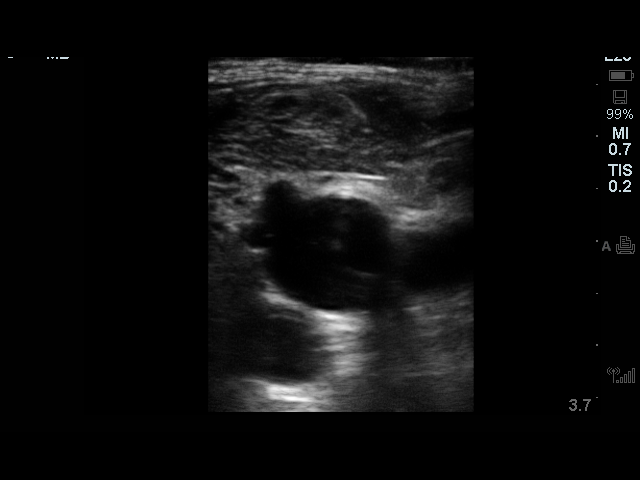
[im 2/3]
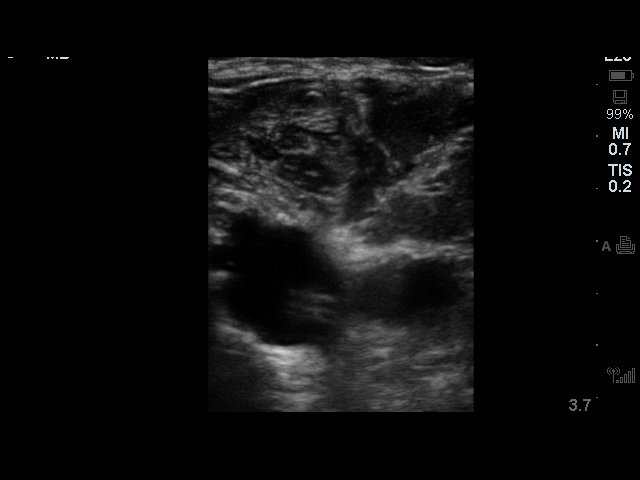
[im 3/3]
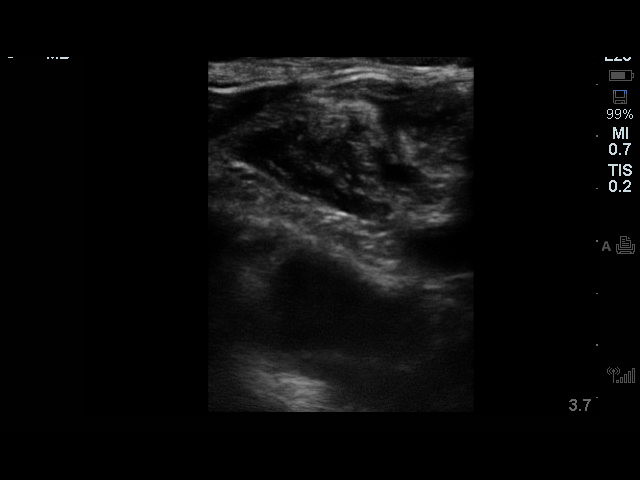

[3 of 3 positions shown; findings below may reference images not displayed]

EXAM:
TUNNELED CENTRAL VENOUS HEMODIALYSIS CATHETER PLACEMENT WITH
ULTRASOUND AND FLUOROSCOPIC GUIDANCE

MEDICATIONS:
Ancef 2 gm IV; The IV antibiotic was given in an appropriate time
interval prior to skin puncture.

CONTRAST:  None

ANESTHESIA/SEDATION:
Versed 1.0 mg IV; Fentanyl 50 mcg IV

Total Moderate Sedation Time

Twenty-one minutes.

FLUOROSCOPY TIME:  Zero minutes 54 seconds.

COMPLICATIONS:
None



After creating a small venotomy incision, a micropuncture kit was
utilized to access the right internal jugular vein under direct,
real-time ultrasound guidance after the overlying soft tissues were
anesthetized with 1% lidocaine with epinephrine. Ultrasound image
documentation was performed. The microwire was kinked to measure
appropriate catheter length. A stiff glidewire was advanced to the
level of the IVC and the micropuncture sheath was exchanged for a
peel-away sheath. A split tear tunneled hemodialysis catheter
measuring 19 cm from tip to cuff was tunneled in a retrograde
fashion from the anterior chest wall to the venotomy incision.

The catheter was then placed through the peel-away sheath with tips
ultimately positioned within the superior aspect of the right
atrium. Final catheter positioning was confirmed and documented with
a spot radiographic image. The catheter aspirates and flushes
normally. The catheter was flushed with appropriate volume heparin
dwells.

The catheter exit site was secured with a 0-Prolene retention
suture. The venotomy incision was closed with Dermabond and
Vaflor. Dressings were applied. The patient tolerated the
procedure well without immediate post procedural complication.
IMPRESSION: Status post placement of right IJ approach tunneled hemodialysis
catheter, split tip measuring 19 cm tip to cuff. Catheter ready for
use.

## 2015-10-09 IMAGING — CR DG CHEST 1V PORT
1 series · 1 of 1 positions shown · non-contrast
Comparison: 01/15/2015.

CLINICAL DATA: Pneumonia.

EXAM:
PORTABLE CHEST - 1 VIEW

[AP]
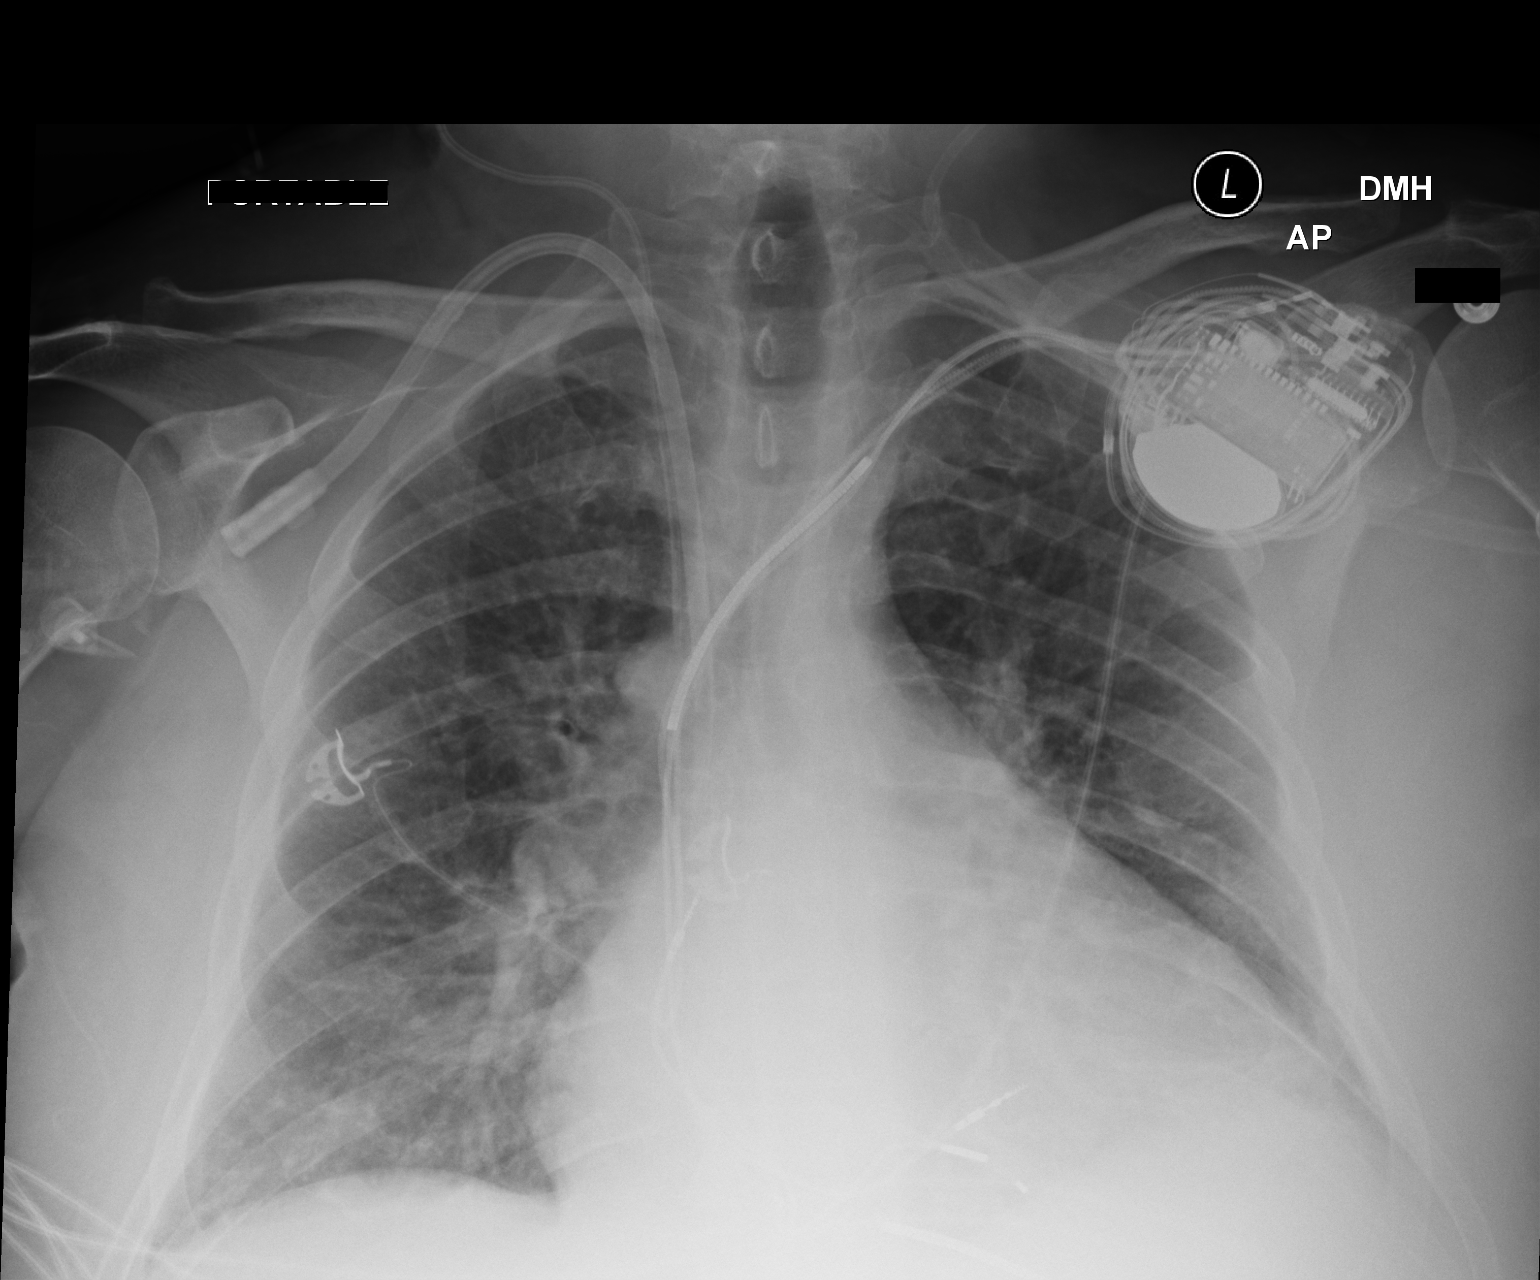

[1 of 1 positions shown; findings below may reference images not displayed]

FINDINGS: Stable enlarged cardiac silhouette. Stable right jugular catheter.
Interval second large bore right jugular catheter with its tip
obscured by an overlying cardiac lead, in the region of the inferior
portion of the superior vena cava. No pneumothorax. Stable left
subclavian pacer and AICD leads. The pulmonary vasculature remains
mildly prominent. Interval mild left basilar atelectasis.
IMPRESSION: 1. Interval second right jugular catheter with its tip in the
inferior aspect of the superior vena cava.
2. Interval mild left basilar atelectasis.
3. Stable cardiomegaly and pulmonary vascular congestion.
